# Patient Record
Sex: Female | Born: 1945 | Race: White | Hispanic: No | Marital: Married | Smoking: Never smoker
Health system: Southern US, Community
[De-identification: ages and names within clinical notes are randomized; demographics above are authoritative.]

## PROBLEM LIST (undated history)

## (undated) DIAGNOSIS — M1990 Unspecified osteoarthritis, unspecified site: Secondary | ICD-10-CM

## (undated) DIAGNOSIS — Z8619 Personal history of other infectious and parasitic diseases: Secondary | ICD-10-CM

## (undated) DIAGNOSIS — K5792 Diverticulitis of intestine, part unspecified, without perforation or abscess without bleeding: Secondary | ICD-10-CM

## (undated) DIAGNOSIS — G43909 Migraine, unspecified, not intractable, without status migrainosus: Secondary | ICD-10-CM

## (undated) DIAGNOSIS — H269 Unspecified cataract: Secondary | ICD-10-CM

## (undated) DIAGNOSIS — T7840XA Allergy, unspecified, initial encounter: Secondary | ICD-10-CM

## (undated) DIAGNOSIS — N200 Calculus of kidney: Secondary | ICD-10-CM

## (undated) DIAGNOSIS — D649 Anemia, unspecified: Secondary | ICD-10-CM

## (undated) DIAGNOSIS — E785 Hyperlipidemia, unspecified: Secondary | ICD-10-CM

## (undated) DIAGNOSIS — N3941 Urge incontinence: Secondary | ICD-10-CM

## (undated) DIAGNOSIS — E739 Lactose intolerance, unspecified: Secondary | ICD-10-CM

## (undated) DIAGNOSIS — I639 Cerebral infarction, unspecified: Secondary | ICD-10-CM

## (undated) DIAGNOSIS — E119 Type 2 diabetes mellitus without complications: Secondary | ICD-10-CM

## (undated) HISTORY — DX: Anemia, unspecified: D64.9

## (undated) HISTORY — DX: Migraine, unspecified, not intractable, without status migrainosus: G43.909

## (undated) HISTORY — PX: TUBAL LIGATION: SHX77

## (undated) HISTORY — DX: Allergy, unspecified, initial encounter: T78.40XA

## (undated) HISTORY — DX: Urge incontinence: N39.41

## (undated) HISTORY — DX: Hyperlipidemia, unspecified: E78.5

## (undated) HISTORY — DX: Unspecified osteoarthritis, unspecified site: M19.90

## (undated) HISTORY — DX: Lactose intolerance, unspecified: E73.9

## (undated) HISTORY — DX: Personal history of other infectious and parasitic diseases: Z86.19

## (undated) HISTORY — PX: LITHOTRIPSY: SUR834

## (undated) HISTORY — DX: Unspecified cataract: H26.9

## (undated) HISTORY — DX: Diverticulitis of intestine, part unspecified, without perforation or abscess without bleeding: K57.92

## (undated) HISTORY — DX: Cerebral infarction, unspecified: I63.9

---

## 1968-05-09 HISTORY — PX: APPENDECTOMY: SHX54

## 1969-05-09 HISTORY — PX: CHOLECYSTECTOMY: SHX55

## 1993-05-09 HISTORY — PX: BREAST SURGERY: SHX581

## 1997-09-30 ENCOUNTER — Ambulatory Visit (HOSPITAL_COMMUNITY): Admission: RE | Admit: 1997-09-30 | Discharge: 1997-09-30 | Payer: Self-pay | Admitting: Neurosurgery

## 2004-05-09 HISTORY — PX: ABDOMINAL HYSTERECTOMY: SHX81

## 2005-11-06 HISTORY — PX: COLONOSCOPY: SHX174

## 2012-11-07 ENCOUNTER — Ambulatory Visit (INDEPENDENT_AMBULATORY_CARE_PROVIDER_SITE_OTHER): Payer: Medicare Other | Admitting: Family Medicine

## 2012-11-07 ENCOUNTER — Encounter: Payer: Self-pay | Admitting: Family Medicine

## 2012-11-07 ENCOUNTER — Telehealth: Payer: Self-pay | Admitting: Family Medicine

## 2012-11-07 DIAGNOSIS — B078 Other viral warts: Secondary | ICD-10-CM

## 2012-11-07 DIAGNOSIS — B372 Candidiasis of skin and nail: Secondary | ICD-10-CM

## 2012-11-07 DIAGNOSIS — K1370 Unspecified lesions of oral mucosa: Secondary | ICD-10-CM | POA: Insufficient documentation

## 2012-11-07 DIAGNOSIS — G609 Hereditary and idiopathic neuropathy, unspecified: Secondary | ICD-10-CM

## 2012-11-07 DIAGNOSIS — B079 Viral wart, unspecified: Secondary | ICD-10-CM

## 2012-11-07 DIAGNOSIS — Z8719 Personal history of other diseases of the digestive system: Secondary | ICD-10-CM

## 2012-11-07 DIAGNOSIS — J309 Allergic rhinitis, unspecified: Secondary | ICD-10-CM

## 2012-11-07 DIAGNOSIS — E785 Hyperlipidemia, unspecified: Secondary | ICD-10-CM | POA: Insufficient documentation

## 2012-11-07 DIAGNOSIS — R87629 Unspecified abnormal cytological findings in specimens from vagina: Secondary | ICD-10-CM

## 2012-11-07 DIAGNOSIS — E1169 Type 2 diabetes mellitus with other specified complication: Secondary | ICD-10-CM | POA: Insufficient documentation

## 2012-11-07 DIAGNOSIS — G43109 Migraine with aura, not intractable, without status migrainosus: Secondary | ICD-10-CM | POA: Insufficient documentation

## 2012-11-07 DIAGNOSIS — Z1231 Encounter for screening mammogram for malignant neoplasm of breast: Secondary | ICD-10-CM

## 2012-11-07 DIAGNOSIS — R87622 Low grade squamous intraepithelial lesion on cytologic smear of vagina (LGSIL): Secondary | ICD-10-CM | POA: Insufficient documentation

## 2012-11-07 DIAGNOSIS — K219 Gastro-esophageal reflux disease without esophagitis: Secondary | ICD-10-CM

## 2012-11-07 DIAGNOSIS — E7800 Pure hypercholesterolemia, unspecified: Secondary | ICD-10-CM

## 2012-11-07 DIAGNOSIS — N952 Postmenopausal atrophic vaginitis: Secondary | ICD-10-CM | POA: Insufficient documentation

## 2012-11-07 DIAGNOSIS — G43909 Migraine, unspecified, not intractable, without status migrainosus: Secondary | ICD-10-CM

## 2012-11-07 DIAGNOSIS — Z8249 Family history of ischemic heart disease and other diseases of the circulatory system: Secondary | ICD-10-CM

## 2012-11-07 DIAGNOSIS — L304 Erythema intertrigo: Secondary | ICD-10-CM | POA: Insufficient documentation

## 2012-11-07 DIAGNOSIS — M19049 Primary osteoarthritis, unspecified hand: Secondary | ICD-10-CM | POA: Insufficient documentation

## 2012-11-07 MED ORDER — NYSTATIN 100000 UNIT/GM EX CREA: TOPICAL_CREAM | Freq: Two times a day (BID) | CUTANEOUS | Status: DC

## 2012-11-07 NOTE — Assessment & Plan Note (Signed)
We will obtain records. We will need to refer to local GYN>

## 2012-11-07 NOTE — Assessment & Plan Note (Addendum)
Had atypical migraine in 08/2012  Had associated speech issue (aphasia, trouble word finding), headache. NO other neuro changes  Work up was negative... Nml MRI.  Recommended referral to neurologist but she has not set this up yet.  She has had no migraine since. On baby aspirin.  Will move forward with referral to neurologist.

## 2012-11-07 NOTE — Assessment & Plan Note (Signed)
Treat with topical nystatin cream. 

## 2012-11-07 NOTE — Telephone Encounter (Signed)
Message copied by Kerby Nora E on Wed Nov 07, 2012  5:25 PM ------      Message from: Ricki Miller H      Created: Wed Nov 07, 2012  5:01 PM      Regarding: orders needed        While meeting with Dezzie, she expressed a desire to get a screening Mammogram at the [facility].  If you will place the order, we'll get it scheduled!  Thank you.            Ricki Miller ------

## 2012-11-07 NOTE — Patient Instructions (Addendum)
Stop at front desk for referral to neurologist. Follow oral lesions.. If not resolved in 4 month.. Call for further evaluation. Let me know if wart not improving with topical treatment.  Make appointment for right arm pain if not improving in next month.

## 2012-11-07 NOTE — Assessment & Plan Note (Signed)
OTC treatment

## 2012-11-07 NOTE — Assessment & Plan Note (Signed)
If not resolving in next 4 month ( 6 months total)... Refer to ENT for further eval. Pt is low risk.

## 2012-11-07 NOTE — Assessment & Plan Note (Signed)
Stable on amitriptyline

## 2012-11-07 NOTE — Progress Notes (Signed)
  Subjective:    Patient ID: Dana [name], female    DOB: 25-Apr-1946, 67 y.o.   MRN: [id number]  HPI  67 year old female presents to establish.  Moved from [place] three years ago.. She cannot drive that far any longer.  Last CPX in [place] in 04/2012  Elevated Cholesterol:  Last check in 04/2013, well controlled on lovastatin 40 mg daily Using medications without problems:None Muscle aches: None Diet compliance:Moderate Exercise: [employer] and [employer] 3 times a week. Other complaints:    Review of Systems  Constitutional: Negative for fever, fatigue and unexpected weight change.  HENT: Negative for ear pain, congestion, sore throat, sneezing, trouble swallowing and sinus pressure.        Oral lesion x 2 months, bites lip  alot  Eyes: Negative for pain and itching.  Respiratory: Negative for cough, shortness of breath and wheezing.   Cardiovascular: Negative for chest pain, palpitations and leg swelling.  Gastrointestinal: Negative for nausea, abdominal pain, diarrhea, constipation and blood in stool.  Genitourinary: Negative for dysuria, hematuria, vaginal discharge, difficulty urinating and menstrual problem.  Skin: Positive for rash.       Red itchy rash under breast for a least a year.  Neurological: Negative for syncope, weakness, light-headedness, numbness and headaches.  Psychiatric/Behavioral: Negative for confusion and dysphoric mood. The patient is not nervous/anxious.        Objective:   Physical Exam  Constitutional: Vital signs are normal. She appears well-developed and well-nourished. She is cooperative.  Non-toxic appearance. She does not appear ill. No distress.  HENT:  Head: Normocephalic.  Right Ear: Hearing, tympanic membrane, external ear and ear canal normal.  Left Ear: Hearing, tympanic membrane, external ear and ear canal normal.  Nose: Nose normal.  Eyes: Conjunctivae, EOM and lids are normal. Pupils are equal, round, and reactive to light. No foreign  bodies found.  Neck: Trachea normal and normal range of motion. Neck supple. Carotid bruit is not present. No mass and no thyromegaly present.  Cardiovascular: Normal rate, regular rhythm, S1 normal, S2 normal, normal heart sounds and intact distal pulses.  Exam reveals no gallop.   No murmur heard. Pulmonary/Chest: Effort normal and breath sounds normal. No respiratory distress. She has no wheezes. She has no rhonchi. She has no rales.  Abdominal: Soft. Normal appearance and bowel sounds are normal. She exhibits no distension, no fluid wave, no abdominal bruit and no mass. There is no hepatosplenomegaly. There is no tenderness. There is no rebound, no guarding and no CVA tenderness. No hernia.  Lymphadenopathy:    She has no cervical adenopathy.    She has no axillary adenopathy.  Neurological: She is alert. She has normal strength. No cranial nerve deficit or sensory deficit.  Skin: Skin is warm, dry and intact. No rash noted.  Erythema, mild under breasts, sweaty  lesion covered with topical treatment on right 2nd digit PIP joint  Hyperpigmented lesion on right lip, well circumscribed , uniform   prominent vein on inner lip on right    Psychiatric: Her speech is normal and behavior is normal. Judgment normal. Her mood appears not anxious. Cognition and memory are normal. She does not exhibit a depressed mood.          Assessment & Plan:

## 2012-11-08 ENCOUNTER — Telehealth: Payer: Self-pay

## 2012-11-08 NOTE — Telephone Encounter (Signed)
Pt wanted to know what pharmacy nystatin cream was sent to; advised pt [facility].pt voiced understanding.

## 2012-11-22 ENCOUNTER — Telehealth: Payer: Self-pay | Admitting: Family Medicine

## 2012-11-22 ENCOUNTER — Encounter: Payer: Self-pay | Admitting: Family Medicine

## 2012-11-22 DIAGNOSIS — K1370 Unspecified lesions of oral mucosa: Secondary | ICD-10-CM

## 2012-11-22 NOTE — Telephone Encounter (Signed)
Notify pt that previous records reviewed. It appears that she does not have a cervix... 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 Last one completely nml in 12/.23.  I agree with recommendations to repeat pap in yearly.Marland Kitchen Next in 04/2013... We can do here or refer her her to gyn per her request. If abn here we would refer any way.

## 2012-11-23 NOTE — Telephone Encounter (Signed)
Referral sent 

## 2012-11-23 NOTE — Addendum Note (Signed)
Addended byKerby Nora E on: 11/23/2012 10:02 AM   Modules accepted: Orders

## 2012-11-23 NOTE — Telephone Encounter (Signed)
Patient advised and she will do her pap her  Patient says she will want the referral to ENT now because she will be out of town sept, oct, nov

## 2012-11-26 ENCOUNTER — Other Ambulatory Visit: Payer: Self-pay | Admitting: Family Medicine

## 2012-11-26 NOTE — Telephone Encounter (Signed)
Pt seen 7/2, ok to refill?

## 2012-11-27 MED ORDER — ESTRADIOL 10 MCG VA TABS: 1.0000 | ORAL_TABLET | VAGINAL | Status: DC

## 2012-11-28 ENCOUNTER — Ambulatory Visit
Admission: RE | Admit: 2012-11-28 | Discharge: 2012-11-28 | Disposition: A | Discharge status: Home / Self Care | Payer: Medicare Other | Source: Ambulatory Visit | Attending: Family Medicine | Admitting: Family Medicine

## 2012-11-28 DIAGNOSIS — Z1231 Encounter for screening mammogram for malignant neoplasm of breast: Secondary | ICD-10-CM

## 2012-12-05 ENCOUNTER — Ambulatory Visit (INDEPENDENT_AMBULATORY_CARE_PROVIDER_SITE_OTHER): Payer: Medicare Other | Admitting: Neurology

## 2012-12-05 ENCOUNTER — Encounter: Payer: Self-pay | Admitting: Neurology

## 2012-12-05 VITALS — BP 141/82 | HR 76 | Ht 64.0 in | Wt 167.0 lb

## 2012-12-05 DIAGNOSIS — G43909 Migraine, unspecified, not intractable, without status migrainosus: Secondary | ICD-10-CM

## 2012-12-05 NOTE — Progress Notes (Signed)
[facility]  PATIENT: Dana [name] DOB: [birth year]  HISTORICAL  Mrs. Eddinger is a 67 yo Caucasian Female, came in for migraine, referred by her primary care physician, Dr. Ermalene Searing.  She had a history of migraine since young, in her 46s, her typical migraine started with visual aura, shadow coming down to one side of her visual field, lasting 5-10 minutes, then followed by a severe pounding headaches, piercing pain, bending over would exacerbate her headaches, her headache usually lasts for one day.  Bethann Berkshire has headaches occasionally, this year she had couple typical migraine headaches, one in May 2014, headache happened after she took a shower, she has difficulty understanding the questions, was taken to urgent care, and ambulance was called, she was sent to emergency room, CAT scan, laboratory evaluation was normal,  The most recent one was July 27th, again proceeding by visual aura, shadowing comes down from her eyes, she has difficulty talking for about 24 hours, severe pounding headaches,  In between episode, she denies visual change, lateralized motor or sensory deficit.  She is taking Relafen as needed for headaches, which seems to work for her, she only has few headaches each year, she does not want preventive medications, She does not want other abortive treatment either.   REVIEW OF SYSTEMS: Full 14 system review of systems performed and notable only for weight gain, swelling in legs, itching, joint pain, aching muscles, skin sensitivity, numbness, insomnia, restless legs, too much sleep, not enough sleep, decreased energy  ALLERGIES: Allergies  Allergen Reactions  . Niacin And Related Itching  . Septra (Sulfamethoxazole W-Trimethoprim) Itching    HOME MEDICATIONS: Outpatient Prescriptions Prior to Visit  Medication Sig Dispense Refill  . amitriptyline (ELAVIL) 25 MG tablet Take 25 mg by mouth at bedtime.  for right leg pain    . aspirin 81 MG tablet  Take 81 mg by mouth daily.      . B Complex Vitamins (B-COMPLEX/B-12) TABS Take by mouth. Take 1000mg  daily      . cholecalciferol (VITAMIN D) 1000 UNITS tablet Take 1,000 Units by mouth daily.      . Coenzyme Q10 60 MG CAPS Take 1 capsule by mouth daily.      . Estradiol (VAGIFEM) 10 MCG TABS vaginal tablet Place 1 tablet (10 mcg total) vaginally 2 (two) times a week.  8 tablet  2  . Krill Oil 300 MG CAPS Take 1 capsule by mouth daily.      Marland Kitchen lactase (LACTAID) 3000 UNITS tablet Take 1 tablet by mouth 3 (three) times daily with meals.      . lovastatin (MEVACOR) 40 MG tablet Take 80 mg by mouth at bedtime.      . Multiple Vitamin (MULTIVITAMIN) tablet Take 1 tablet by mouth daily.      . nabumetone (RELAFEN) 750 MG tablet Take 750 mg by mouth 2 (two) times daily.      Marland Kitchen nystatin cream (MYCOSTATIN) Apply topically 2 (two) times daily. Continue for 48 hours after rash resolved.  30 g  0  . omeprazole (PRILOSEC) 20 MG capsule Take 20 mg by mouth daily.      . Turmeric Curcumin 500 MG CAPS Take 1 tablet by mouth daily.      Marland Kitchen estradiol (VIVELLE-DOT) 0.0375 MG/24HR Place 1 patch onto the skin every 30 (thirty) days.         PAST MEDICAL HISTORY: Past Medical History  Diagnosis Date  . Anemia   . Arthritis   .  Allergy   . Hyperlipidemia   . Migraine   . History of chicken pox   . Diverticulitis     PAST SURGICAL HISTORY: Past Surgical History  Procedure Laterality Date  . Appendectomy  1970  . Breast surgery  1995  . Cholecystectomy  1971  . Tubal ligation    . Abdominal hysterectomy  2006    FAMILY HISTORY: Family History  Problem Relation Age of Onset  . Heart disease Mother 38  . Hyperlipidemia Mother   . Hyperlipidemia Father   . Stroke Father   . Heart disease Father   . Fibromyalgia Sister   . Hyperlipidemia Sister   . Heart disease Brother   . Diabetes Brother     SOCIAL HISTORY:  History   Social History  . Marital Status: Married    Spouse Name: [name]     Number of Children: 1  . Years of Education: 12   Occupational History    Retired as Designer, jewellery   Social History Main Topics  . Smoking status: Never Smoker   . Smokeless tobacco: Never Used  . Alcohol Use: No  . Drug Use: No  . Sexually Active: Not on file   Other Topics Concern  . Not on file   Social History Narrative    E xercise 3 times a week.   Patient is married and lives at home with her husband [name]). Patient is retired. Patient has high  school education.   Right handed.     PHYSICAL EXAM    Filed Vitals:   12/05/12 0927  BP: 141/82  Pulse: 76  Height: 5\' 4"  (1.626 m)  Weight: 167 lb (75.751 kg)     Body mass index is 28.65 kg/(m^2).   Generalized: In no acute distress  Neck: Supple, no carotid bruits   Cardiac: Regular rate rhythm  Pulmonary: Clear to auscultation bilaterally  Musculoskeletal: No deformity  Neurological examination  Mentation: Alert oriented to time, place, history taking, and causual conversation  Cranial nerve II-XII: Pupils were equal round reactive to light extraocular movements were full, visual field were full on confrontational test. facial sensation and strength were normal. hearing was intact to finger rubbing bilaterally. Uvula tongue midline.  head turning and shoulder shrug and were normal and symmetric.Tongue protrusion into cheek strength was normal.  Motor: normal tone, bulk and strength.  Sensory: Intact to fine touch, pinprick, preserved vibratory sensation, and proprioception at toes.  Coordination: Normal finger to nose, heel-to-shin bilaterally there was no truncal ataxia  Gait: Rising up from seated position without assistance, normal stance, without trunk ataxia, moderate stride, good arm swing, smooth turning, able to perform tiptoe, and heel walking without difficulty.   Romberg signs: Negative  Deep tendon reflexes: Brachioradialis 2/2, biceps 2/2, triceps 2/2, patellar 2/2, Achilles 2/2,  plantar responses were flexor bilaterally.   DIAGNOSTIC DATA (LABS, IMAGING, TESTING) - I reviewed patient records, labs, notes, testing and imaging myself where available.  ASSESSMENT AND PLAN   67 years old Caucasian female, with migraine headaches, proceeding by aura, normal neurological examination.  1. Relafen as needed for migraine treatment 2. return to clinic with Eber Jones in one year.     Levert Feinstein, M.D. Ph.D.  [facility] [address] [place], [place] [address] [phone]

## 2012-12-06 ENCOUNTER — Encounter: Payer: Self-pay | Admitting: Family Medicine

## 2012-12-12 ENCOUNTER — Other Ambulatory Visit: Payer: Self-pay

## 2013-01-04 ENCOUNTER — Other Ambulatory Visit: Payer: Self-pay | Admitting: *Deleted

## 2013-01-04 MED ORDER — OMEPRAZOLE 20 MG PO CPDR: 20.0000 mg | DELAYED_RELEASE_CAPSULE | Freq: Every day | ORAL | Status: DC

## 2013-01-04 MED ORDER — LOVASTATIN 40 MG PO TABS: 80.0000 mg | ORAL_TABLET | Freq: Every day | ORAL | Status: DC

## 2013-01-04 NOTE — Telephone Encounter (Signed)
OK to refill

## 2013-01-08 MED ORDER — AMITRIPTYLINE HCL 25 MG PO TABS: 25.0000 mg | ORAL_TABLET | Freq: Every day | ORAL | Status: DC

## 2013-01-08 MED ORDER — NABUMETONE 750 MG PO TABS: 750.0000 mg | ORAL_TABLET | Freq: Two times a day (BID) | ORAL | Status: DC

## 2013-01-09 ENCOUNTER — Ambulatory Visit (INDEPENDENT_AMBULATORY_CARE_PROVIDER_SITE_OTHER): Payer: Medicare Other

## 2013-01-09 DIAGNOSIS — Z23 Encounter for immunization: Secondary | ICD-10-CM

## 2013-01-14 ENCOUNTER — Other Ambulatory Visit: Payer: Self-pay | Admitting: *Deleted

## 2013-01-14 NOTE — Telephone Encounter (Signed)
Last OV 11/07/2012.  Ok to refill?

## 2013-01-15 ENCOUNTER — Other Ambulatory Visit: Payer: Self-pay

## 2013-01-15 MED ORDER — LOVASTATIN 40 MG PO TABS: 80.0000 mg | ORAL_TABLET | Freq: Every day | ORAL | Status: DC

## 2013-01-15 MED ORDER — AMITRIPTYLINE HCL 25 MG PO TABS: 25.0000 mg | ORAL_TABLET | Freq: Every day | ORAL | Status: DC

## 2013-01-15 MED ORDER — OMEPRAZOLE 20 MG PO CPDR: 20.0000 mg | DELAYED_RELEASE_CAPSULE | Freq: Every day | ORAL | Status: DC

## 2013-01-15 MED ORDER — NYSTATIN 100000 UNIT/GM EX CREA: TOPICAL_CREAM | Freq: Two times a day (BID) | CUTANEOUS | Status: DC

## 2013-01-15 MED ORDER — NABUMETONE 750 MG PO TABS: 750.0000 mg | ORAL_TABLET | Freq: Two times a day (BID) | ORAL | Status: DC

## 2013-01-15 NOTE — Telephone Encounter (Signed)
Pt left note requesting 3 month supply to [facility] for Elavil, Relafen. Omeprazole and lovastatin already done.Please advise.

## 2013-02-11 ENCOUNTER — Telehealth: Payer: Self-pay

## 2013-02-11 NOTE — Telephone Encounter (Signed)
Pt received missed call; spoke with Dr Daphine Deutscher CMA and she is not trying to reach pt; could have been wrong #; pt will wait for cb if needed. Pt said she does not need anything at this time.

## 2013-03-13 ENCOUNTER — Other Ambulatory Visit: Payer: Self-pay | Admitting: *Deleted

## 2013-03-13 NOTE — Telephone Encounter (Signed)
Last office visit 11/07/2012.  Not on medication list.  Ok to fill?

## 2013-03-14 MED ORDER — FLUTICASONE PROPIONATE 50 MCG/ACT NA SUSP: 2.0000 | Freq: Every day | NASAL | Status: DC

## 2013-04-19 ENCOUNTER — Other Ambulatory Visit: Payer: Self-pay | Admitting: Family Medicine

## 2013-04-26 ENCOUNTER — Ambulatory Visit (INDEPENDENT_AMBULATORY_CARE_PROVIDER_SITE_OTHER): Payer: Medicare Other | Admitting: Family Medicine

## 2013-04-26 ENCOUNTER — Encounter: Payer: Self-pay | Admitting: Family Medicine

## 2013-04-26 VITALS — BP 120/84 | HR 91 | Temp 98.1°F | Ht 63.0 in | Wt 172.5 lb

## 2013-04-26 DIAGNOSIS — R87622 Low grade squamous intraepithelial lesion on cytologic smear of vagina (LGSIL): Secondary | ICD-10-CM

## 2013-04-26 DIAGNOSIS — E7800 Pure hypercholesterolemia, unspecified: Secondary | ICD-10-CM

## 2013-04-26 DIAGNOSIS — Z0000 Encounter for general adult medical examination without abnormal findings: Secondary | ICD-10-CM

## 2013-04-26 MED ORDER — FLUTICASONE PROPIONATE 50 MCG/ACT NA SUSP: 2.0000 | Freq: Every day | NASAL | Status: DC

## 2013-04-26 MED ORDER — NABUMETONE 750 MG PO TABS: 750.0000 mg | ORAL_TABLET | Freq: Two times a day (BID) | ORAL | Status: DC

## 2013-04-26 MED ORDER — AMITRIPTYLINE HCL 25 MG PO TABS: 25.0000 mg | ORAL_TABLET | Freq: Every day | ORAL | Status: DC

## 2013-04-26 MED ORDER — LOVASTATIN 40 MG PO TABS: 80.0000 mg | ORAL_TABLET | Freq: Every day | ORAL | Status: DC

## 2013-04-26 MED ORDER — ESTRADIOL 0.0375 MG/24HR TD PTTW: 1.0000 | MEDICATED_PATCH | TRANSDERMAL | Status: DC

## 2013-04-26 MED ORDER — OMEPRAZOLE 20 MG PO CPDR: 20.0000 mg | DELAYED_RELEASE_CAPSULE | Freq: Every day | ORAL | Status: DC

## 2013-04-26 NOTE — Assessment & Plan Note (Signed)
Due for re-eval. 

## 2013-04-26 NOTE — Addendum Note (Signed)
Addended by: Damita Lack on: 04/26/2013 05:22 PM   Modules accepted: Orders

## 2013-04-26 NOTE — Patient Instructions (Addendum)
Return for fasting labs in next few weeks.  Stop at front for GYN referral. When you are scheduling mammogram in the summer 11/2013.. Call us to get an bone density order sent. [facility].

## 2013-04-26 NOTE — Progress Notes (Signed)
Subjective:    Patient ID: Dana [name], female    DOB: May 03, 1946, 67 y.o.   MRN: [id number]  HPI  I have personally reviewed the Medicare Annual Wellness questionnaire and have noted 1. The patient's medical and social history 2. Their use of alcohol, tobacco or illicit drugs 3. Their current medications and supplements 4. The patient's functional ability including ADL's, fall risks, home safety risks and hearing or visual             impairment. 5. Diet and physical activities 6. Evidence for depression or mood disorders The patients weight, height, BMI and visual acuity have been recorded in the chart I have made referrals, counseling and provided education to the patient based review of the above and I have provided the pt with a written personalized care plan for preventive services.    Elevated Cholesterol: Due for re-eval on mevacor Using medications without problems: Muscle aches:  Diet compliance: Exercise: Other complaints:  Migraine followed by Dr. Terrace Arabia neurology.    Review of Systems  Constitutional: Negative for fever and fatigue.  HENT: Negative for ear pain.   Eyes: Negative for pain.  Respiratory: Negative for chest tightness and shortness of breath.   Cardiovascular: Positive for leg swelling. Negative for chest pain and palpitations.       Off and on ankle edema  Gastrointestinal: Negative for abdominal pain.  Genitourinary: Negative for dysuria and vaginal bleeding.  Neurological: Negative for syncope.  Psychiatric/Behavioral: Negative for behavioral problems.       Objective:   Physical Exam  Constitutional: Vital signs are normal. She appears well-developed and well-nourished. She is cooperative.  Non-toxic appearance. She does not appear ill. No distress.  HENT:  Head: Normocephalic.  Right Ear: Hearing, tympanic membrane, external ear and ear canal normal.  Left Ear: Hearing, tympanic membrane, external ear and ear canal normal.  Nose: Nose  normal.  Eyes: Conjunctivae, EOM and lids are normal. Pupils are equal, round, and reactive to light. Lids are everted and swept, no foreign bodies found.  Neck: Trachea normal and normal range of motion. Neck supple. Carotid bruit is not present. No mass and no thyromegaly present.  Cardiovascular: Normal rate, regular rhythm, S1 normal, S2 normal, normal heart sounds and intact distal pulses.  Exam reveals no gallop.   No murmur heard. Pulmonary/Chest: Effort normal and breath sounds normal. No respiratory distress. She has no wheezes. She has no rhonchi. She has no rales.  Abdominal: Soft. Normal appearance and bowel sounds are normal. She exhibits no distension, no fluid wave, no abdominal bruit and no mass. There is no hepatosplenomegaly. There is no tenderness. There is no rebound, no guarding and no CVA tenderness. No hernia.  Genitourinary: No breast swelling, tenderness, discharge or bleeding. Pelvic exam was performed with patient prone.  Lymphadenopathy:    She has no cervical adenopathy.    She has no axillary adenopathy.  Neurological: She is alert. She has normal strength. No cranial nerve deficit or sensory deficit.  Skin: Skin is warm, dry and intact. No rash noted.  Psychiatric: Her speech is normal and behavior is normal. Judgment normal. Her mood appears not anxious. Cognition and memory are normal. She does not exhibit a depressed mood.          Assessment & Plan:  The patient's preventative maintenance and recommended screening tests for an annual wellness exam were reviewed in full today. Brought up to date unless services declined.  Counselled on the importance of  diet, exercise, and its role in overall health and mortality. The patient's FH and SH was reviewed, including their home life, tobacco status, and drug and alcohol status.    Vaccines:Tdap, shingles , PNA, and flu uptodate Colon: 2008 nml, repeat in 10 years. Mammo:11/2012 DVE/PAP: Hx of abnormal Pap  LGSIL 2011, then abnormal pap of vaginal cuff ASCUS 2012 s/p TAH ( TAH in 2006  performed for abnormal pap), but nml 2013 nml pap of vaginal cuff.. Referred to GYN. DEXA: overdue. Nonsmoker

## 2013-04-26 NOTE — Progress Notes (Signed)
Pre-visit discussion using our clinic review tool. No additional management support is needed unless otherwise documented below in the visit note.  

## 2013-04-29 ENCOUNTER — Other Ambulatory Visit (INDEPENDENT_AMBULATORY_CARE_PROVIDER_SITE_OTHER): Payer: Medicare Other

## 2013-04-29 DIAGNOSIS — E7800 Pure hypercholesterolemia, unspecified: Secondary | ICD-10-CM

## 2013-04-29 LAB — COMPREHENSIVE METABOLIC PANEL
ALT: 37 U/L — ABNORMAL HIGH (ref 0–35)
AST: 31 U/L (ref 0–37)
BUN: 13 mg/dL (ref 6–23)
CO2: 29 mEq/L (ref 19–32)
Creatinine, Ser: 0.7 mg/dL (ref 0.4–1.2)
GFR: 87.47 mL/min (ref >60.00)
Glucose, Bld: 108 mg/dL — ABNORMAL HIGH (ref 70–99)
Sodium: 141 mEq/L (ref 135–145)
Total Bilirubin: 0.8 mg/dL (ref 0.3–1.2)
Total Protein: 7.3 g/dL (ref 6.0–8.3)

## 2013-04-29 LAB — LIPID PANEL
Cholesterol: 188 mg/dL (ref 0–200)
HDL: 46.5 mg/dL (ref >39.00)
LDL Cholesterol: 118 mg/dL — ABNORMAL HIGH (ref 0–99)
Triglycerides: 120 mg/dL (ref 0.0–149.0)
VLDL: 24 mg/dL (ref 0.0–40.0)

## 2013-04-30 ENCOUNTER — Encounter: Payer: Self-pay | Admitting: Family Medicine

## 2013-05-07 ENCOUNTER — Encounter: Payer: Self-pay | Admitting: *Deleted

## 2013-05-09 HISTORY — PX: OTHER SURGICAL HISTORY: SHX169

## 2013-05-14 ENCOUNTER — Encounter: Payer: Self-pay | Admitting: Family Medicine

## 2013-09-23 ENCOUNTER — Other Ambulatory Visit: Payer: Self-pay | Admitting: *Deleted

## 2013-09-23 MED ORDER — LOVASTATIN 40 MG PO TABS: 80.0000 mg | ORAL_TABLET | Freq: Every day | ORAL | Status: DC

## 2013-11-22 ENCOUNTER — Encounter: Payer: Self-pay | Admitting: Family Medicine

## 2013-11-22 ENCOUNTER — Ambulatory Visit (INDEPENDENT_AMBULATORY_CARE_PROVIDER_SITE_OTHER): Payer: Medicare Other | Admitting: Family Medicine

## 2013-11-22 VITALS — BP 126/70 | HR 74 | Temp 97.9°F | Wt 173.5 lb

## 2013-11-22 DIAGNOSIS — M659 Synovitis and tenosynovitis, unspecified: Secondary | ICD-10-CM

## 2013-11-22 DIAGNOSIS — M7670 Peroneal tendinitis, unspecified leg: Secondary | ICD-10-CM

## 2013-11-22 NOTE — Patient Instructions (Signed)
This looks like a strain.  Ice your foot and use a lace up ankle brace.  Try to get off your feet and this should get better.  Take care.

## 2013-11-22 NOTE — Progress Notes (Signed)
Pre visit review using our clinic review tool, if applicable. No additional management support is needed unless otherwise documented below in the visit note.  R ankle pain.  Prev eval, was told she had edema due to HTN. Was advised to 'take a fluid pill'.  More pain recently.  Pain on the dorsum of the R foot, lateral R ankle and then up the lateral shin.  Pain with dorsiflexion.  More pain since yesterday.  Was having trouble wearing her tennis shoes, couldn't find anything comfortable.  Had been walking a lot recently, up hills in [place] on vacation.  No trauma.  No L foot pain.    Meds, vitals, and allergies reviewed.   ROS: See HPI.  Otherwise, noncontributory.  nad ncat R foot w/o edema Normal inspection, not puffy, no bruising Normal DP pulse. 5th MT not ttp Bony prominences not ttp ttp inf and posterior to lateral mal, but not on the mal itself.

## 2013-11-24 DIAGNOSIS — M7670 Peroneal tendinitis, unspecified leg: Secondary | ICD-10-CM | POA: Insufficient documentation

## 2013-11-24 NOTE — Assessment & Plan Note (Signed)
Likely dx, RICE, lace up ankle brace and f/u prn.  D/w pt.  Likely strain with recent walking.

## 2013-11-25 ENCOUNTER — Other Ambulatory Visit: Payer: Self-pay | Admitting: *Deleted

## 2013-11-25 MED ORDER — OMEPRAZOLE 20 MG PO CPDR: 20.0000 mg | DELAYED_RELEASE_CAPSULE | Freq: Every day | ORAL | Status: DC

## 2013-11-25 NOTE — Telephone Encounter (Signed)
Received faxed refill request from pharmacy. Refill sent to pharmacy electronically. 

## 2013-12-05 ENCOUNTER — Ambulatory Visit (INDEPENDENT_AMBULATORY_CARE_PROVIDER_SITE_OTHER): Payer: Medicare Other | Admitting: Nurse Practitioner

## 2013-12-05 ENCOUNTER — Encounter: Payer: Self-pay | Admitting: Nurse Practitioner

## 2013-12-05 VITALS — BP 140/80 | HR 73 | Ht 64.0 in | Wt 171.5 lb

## 2013-12-05 DIAGNOSIS — G43909 Migraine, unspecified, not intractable, without status migrainosus: Secondary | ICD-10-CM

## 2013-12-05 MED ORDER — NABUMETONE 750 MG PO TABS: 750.0000 mg | ORAL_TABLET | Freq: Two times a day (BID) | ORAL | Status: DC | PRN

## 2013-12-05 NOTE — Patient Instructions (Signed)
Continue Relafen for headache, will refill Given information on Migraine triggers F/U yearly and PRN

## 2013-12-05 NOTE — Progress Notes (Signed)
[facility]  PATIENT: Dana [name] DOB: 11-Feb-1946   REASON FOR VISIT: follow up for migraine    HISTORY OF PRESENT ILLNESS: Dana [name], 68 year old female returns for followup. She has a history of migraine since her 20s,her typical migraine started with visual aura, shadow coming down to one side of her visual field, lasting 5-10 minutes, then followed by a severe pounding headaches, piercing pain, bending over would exacerbate her headaches, her headache usually lasts for one day. She reports 2 migraines in the last year. The most recent was about a week ago. She is taking her Relafen as needed which continues to work for her. She does not want preventive medication.  She returns for reevaluation   HISTORY: Dana [name] is a 68 yo Caucasian Female, came in for migraine, referred by her primary care physician, Dr. Diona Browner.  She had a history of migraine since young, in her 1s, her typical migraine started with visual aura, shadow coming down to one side of her visual field, lasting 5-10 minutes, then followed by a severe pounding headaches, piercing pain, bending over would exacerbate her headaches, her headache usually lasts for one day.  Dana [name] has headaches occasionally, this year she had couple typical migraine headaches, one in May 2014, headache happened after she took a shower, she has difficulty understanding the questions, was taken to urgent care, and ambulance was called, she was sent to emergency room, CAT scan, laboratory evaluation was normal,  The most recent one was July 27th, again proceeding by visual aura, shadowing comes down from her eyes, she has difficulty talking for about 24 hours, severe pounding headaches,  In between episode, she denies visual change, lateralized motor or sensory deficit.  She is taking Relafen as needed for headaches, which seems to work for her, she only has few headaches each year, she does not want preventive medications, She  does not want other abortive treatment either.   REVIEW OF SYSTEMS: Full 14 system review of systems performed and notable only for those listed, all others are neg:  Constitutional: N/A  Cardiovascular: N/A  Ear/Nose/Throat: N/A  Skin: N/A  Eyes: N/A  Respiratory: N/A  Gastroitestinal: N/A  Hematology/Lymphatic: N/A  Endocrine: N/A Musculoskeletal:N/A  Allergy/Immunology: N/A  Neurological: N/A Psychiatric: N/A Sleep : NA   ALLERGIES: Allergies  Allergen Reactions  . Niacin And Related Itching  . Septra [Sulfamethoxazole-Trimethoprim] Itching    HOME MEDICATIONS: Outpatient Prescriptions Prior to Visit  Medication Sig Dispense Refill  . aspirin 81 MG tablet Take 81 mg by mouth daily.      . B Complex Vitamins (B-COMPLEX/B-12) TABS Take by mouth. Take 1000mg  daily      . cholecalciferol (VITAMIN D) 1000 UNITS tablet Take 1,000 Units by mouth daily.      . Coenzyme Q10 60 MG CAPS Take 1 capsule by mouth daily.      . fluticasone (FLONASE) 50 MCG/ACT nasal spray Place 2 sprays into both nostrils daily.  48 g  3  . Krill Oil 300 MG CAPS Take 1 capsule by mouth daily.      Marland Kitchen lactase (LACTAID) 3000 UNITS tablet Take 1 tablet by mouth 3 (three) times daily with meals.      . lovastatin (MEVACOR) 40 MG tablet Take 2 tablets (80 mg total) by mouth at bedtime.  180 tablet  1  . Multiple Vitamin (MULTIVITAMIN) tablet Take 1 tablet by mouth daily.      . nabumetone (RELAFEN) 750 MG tablet Take 1  tablet (750 mg total) by mouth 2 (two) times daily.  180 tablet  1  . nystatin cream (MYCOSTATIN) Apply topically 2 (two) times daily. Continue for 48 hours after rash resolved.  30 g  0  . omeprazole (PRILOSEC) 20 MG capsule Take 1 capsule (20 mg total) by mouth daily.  90 capsule  0  . Turmeric Curcumin 500 MG CAPS Take 1 tablet by mouth daily.      Marland Kitchen VAGIFEM 10 MCG TABS vaginal tablet USE 1 TAB VAGINALLY 2 TIMES A WEEK  8 tablet  5   No facility-administered medications prior to visit.     PAST MEDICAL HISTORY: Past Medical History  Diagnosis Date  . Anemia   . Arthritis   . Allergy   . Hyperlipidemia   . Migraine   . History of chicken pox   . Diverticulitis     PAST SURGICAL HISTORY: Past Surgical History  Procedure Laterality Date  . Appendectomy  1970  . Breast surgery  1995    biopsy  . Cholecystectomy  1971  . Tubal ligation    . Abdominal hysterectomy  2006    cervix remains, menorrhagia    FAMILY HISTORY: Family History  Problem Relation Age of Onset  . Heart disease Mother 53  . Hyperlipidemia Mother   . Hyperlipidemia Father   . Stroke Father   . Heart disease Father   . Fibromyalgia Sister   . Hyperlipidemia Sister   . Heart disease Brother   . Diabetes Brother     SOCIAL HISTORY: History   Social History  . Marital Status: Married    Spouse Name: [name]    Number of Children: 1  . Years of Education: 12   Occupational History  .      Retired   Social History Main Topics  . Smoking status: Never Smoker   . Smokeless tobacco: Never Used  . Alcohol Use: No  . Drug Use: No  . Sexual Activity: Not on file   Other Topics Concern  . Not on file   Social History Narrative       E xercise 3 times a week.      Patient is married and lives at home with her husband [name]). Patient is retired. Patient has high  school education.   Right handed.   Caffeine- None      HAs living will, HCPOA, full code ( reviewed 2014)     PHYSICAL EXAM  Filed Vitals:   12/05/13 0916  BP: 154/75  Pulse: 73  Height: 5\' 4"  (1.626 m)  Weight: 171 lb 8 oz (77.792 kg)   Body mass index is 29.42 kg/(m^2). Generalized: In no acute distress  Neck: Supple, no carotid bruits , bil cerumen Musculoskeletal: No deformity  Neurological examination  Mentation: Alert oriented to time, place, history taking, and causual conversation  Cranial nerve II-XII: Pupils were equal round reactive to light extraocular movements were full, visual field were  full on confrontational test. facial sensation and strength were normal. hearing was intact to finger rubbing bilaterally. Uvula tongue midline. head turning and shoulder shrug and were normal and symmetric.Tongue protrusion into cheek strength was normal.  Motor: normal tone, bulk and strength. No focal weakness Coordination: Normal finger to nose, heel-to-shin bilaterally there was no truncal ataxia  Gait: Rising up from seated position without assistance, normal stance,  moderate stride, good arm swing, smooth turning, able to perform tiptoe, and heel walking without difficulty.  Romberg signs: Negative  Deep tendon reflexes: Brachioradialis 2/2, biceps 2/2, triceps 2/2, patellar 2/2, Achilles 2/2, plantar responses were flexor bilaterally.    DIAGNOSTIC DATA (LABS, IMAGING, TESTING) - I reviewed patient records, labs, notes, testing and imaging myself where available.      Component Value Date/Time   NA 141 04/29/2013 0841   K 3.5 04/29/2013 0841   CL 105 04/29/2013 0841   CO2 29 04/29/2013 0841   GLUCOSE 108* 04/29/2013 0841   BUN 13 04/29/2013 0841   CREATININE 0.7 04/29/2013 0841   CALCIUM 9.0 04/29/2013 0841   PROT 7.3 04/29/2013 0841   ALBUMIN 3.9 04/29/2013 0841   AST 31 04/29/2013 0841   ALT 37* 04/29/2013 0841   ALKPHOS 78 04/29/2013 0841   BILITOT 0.8 04/29/2013 0841   Lab Results  Component Value Date   CHOL 188 04/29/2013   HDL 46.50 04/29/2013   LDLCALC 118* 04/29/2013   TRIG 120.0 04/29/2013   CHOLHDL 4 04/29/2013    ASSESSMENT AND PLAN  68 y.o. year old female  has a past medical history of migraine headaches preceded by aura with  normal neurologic exam  Continue Relafen for headache, will refill Given information on Migraine triggers F/U yearly and PRN Dennie Bible, [facility], [facility], APRN  [facility] [address] [place], [place] [address] [phone]

## 2013-12-10 ENCOUNTER — Encounter: Payer: Self-pay | Admitting: Family Medicine

## 2013-12-10 ENCOUNTER — Ambulatory Visit (INDEPENDENT_AMBULATORY_CARE_PROVIDER_SITE_OTHER): Payer: Medicare Other | Admitting: Family Medicine

## 2013-12-10 VITALS — BP 120/78 | HR 70 | Temp 98.4°F | Ht 63.0 in | Wt 171.5 lb

## 2013-12-10 DIAGNOSIS — M659 Synovitis and tenosynovitis, unspecified: Secondary | ICD-10-CM

## 2013-12-10 DIAGNOSIS — M67479 Ganglion, unspecified ankle and foot: Secondary | ICD-10-CM | POA: Insufficient documentation

## 2013-12-10 DIAGNOSIS — R030 Elevated blood-pressure reading, without diagnosis of hypertension: Secondary | ICD-10-CM | POA: Insufficient documentation

## 2013-12-10 DIAGNOSIS — M7671 Peroneal tendinitis, right leg: Secondary | ICD-10-CM

## 2013-12-10 DIAGNOSIS — M6740 Ganglion, unspecified site: Secondary | ICD-10-CM

## 2013-12-10 MED ORDER — FUROSEMIDE 20 MG PO TABS: 20.0000 mg | ORAL_TABLET | ORAL | Status: DC | PRN

## 2013-12-10 NOTE — Assessment & Plan Note (Signed)
Nl BP today. Verify measurements elevated > 3 times at home for diagnosis.  Encouraged exercise, weight loss, healthy eating habits.  Follow up in 2 weeks.

## 2013-12-10 NOTE — Assessment & Plan Note (Signed)
Improving. Pain in 5th digit likely due to gait change.  Recommended home rehab on ankle INFo given in detail for exercises.

## 2013-12-10 NOTE — Patient Instructions (Addendum)
Check BP on home cufff daily, at various times of day. Record measurements.  Follow BP in 2 weeks, bring in home record.  Work on regular exercise. Work on low salt heart healthy diet.  Apply unmedicated corn pad to right toe lesion.  Start home ankle rehab.

## 2013-12-10 NOTE — Progress Notes (Signed)
Subjective:    Patient ID: Dana [name], female    DOB: [birth year], 68 y.o.   MRN: [id number]  HPI  68 year old female presents for BP check as well as right foot/ankle pain.  Had BP elevation at Dr. Earlie Server office. 140-153/80. Was not in pain. No new meds, no decongestants. No previous Dx HTN,  Has family hx of early CAD, HTN. Uses lasix prn swelling BP Readings from Last 3 Encounters:  12/10/13 120/78  12/05/13 140/80  11/22/13 126/70  Using medication without problems or lightheadedness:  Chest pain with exertion: None Edema: once a month using lasix Short of breath:None Average home BPs: not checking. Other issues: Exercises 3 days a week.  Last CPX in 04/2013  Right ankle/ foot pain: On going since early 11/2013.  Noted pain after trip doing a lot of walking hills. She has been wearing lace up ankle brace. Pain has improved some but still soreness in right little toe, mild soreness in lateral ankle. Occ shooting pain. She has not been doing any rehab exercise. No redness and swelling.  She has also noted a skin lesion on right 4th toe, improved some  OTC compound W but then worsened again.      Review of Systems  Constitutional: Negative for fever and fatigue.  HENT: Negative for ear pain.   Eyes: Negative for pain.  Respiratory: Negative for chest tightness and shortness of breath.   Cardiovascular: Positive for leg swelling. Negative for chest pain and palpitations.  Gastrointestinal: Negative for abdominal pain.  Genitourinary: Negative for dysuria.       Objective:   Physical Exam  Constitutional: Vital signs are normal. She appears well-developed and well-nourished. She is cooperative.  Non-toxic appearance. She does not appear ill. No distress.  HENT:  Head: Normocephalic.  Right Ear: Hearing, tympanic membrane, external ear and ear canal normal. Tympanic membrane is not erythematous, not retracted and not bulging.  Left Ear: Hearing, tympanic  membrane, external ear and ear canal normal. Tympanic membrane is not erythematous, not retracted and not bulging.  Nose: No mucosal edema or rhinorrhea. Right sinus exhibits no maxillary sinus tenderness and no frontal sinus tenderness. Left sinus exhibits no maxillary sinus tenderness and no frontal sinus tenderness.  Mouth/Throat: Uvula is midline, oropharynx is clear and moist and mucous membranes are normal.  Eyes: Conjunctivae, EOM and lids are normal. Pupils are equal, round, and reactive to light. Lids are everted and swept, no foreign bodies found.  Neck: Trachea normal and normal range of motion. Neck supple. Carotid bruit is not present. No mass and no thyromegaly present.  Cardiovascular: Normal rate, regular rhythm, S1 normal, S2 normal, normal heart sounds, intact distal pulses and normal pulses.  Exam reveals no gallop and no friction rub.   No murmur heard. Pulmonary/Chest: Effort normal and breath sounds normal. Not tachypneic. No respiratory distress. She has no decreased breath sounds. She has no wheezes. She has no rhonchi. She has no rales.  Abdominal: Soft. Normal appearance and bowel sounds are normal. There is no tenderness.  Musculoskeletal:       Right ankle: No tenderness. No lateral malleolus and no medial malleolus tenderness found.       Right foot: She exhibits no tenderness.  Neurological: She is alert.  Skin: Skin is warm, dry and intact. No rash noted.  cear fluid filled cyst on 4th digit... ? Myxedema cyst?  Psychiatric: Her speech is normal and behavior is normal. Judgment and thought content normal.  Her mood appears not anxious. Cognition and memory are normal. She does not exhibit a depressed mood.          Assessment & Plan:

## 2013-12-10 NOTE — Assessment & Plan Note (Signed)
Stop wart treatment. No wart present. Apply corn pad unmedicated to stop rubbing. If bothering more... Can refer for removal.

## 2013-12-10 NOTE — Progress Notes (Signed)
Pre visit review using our clinic review tool, if applicable. No additional management support is needed unless otherwise documented below in the visit note. 

## 2013-12-12 ENCOUNTER — Ambulatory Visit: Payer: Medicare Other | Admitting: Nurse Practitioner

## 2013-12-23 ENCOUNTER — Other Ambulatory Visit: Payer: Self-pay

## 2013-12-23 ENCOUNTER — Ambulatory Visit: Payer: Medicare Other | Admitting: Nurse Practitioner

## 2013-12-23 DIAGNOSIS — Z1231 Encounter for screening mammogram for malignant neoplasm of breast: Secondary | ICD-10-CM

## 2013-12-24 ENCOUNTER — Ambulatory Visit (INDEPENDENT_AMBULATORY_CARE_PROVIDER_SITE_OTHER): Payer: Medicare Other | Admitting: Family Medicine

## 2013-12-24 ENCOUNTER — Encounter: Payer: Self-pay | Admitting: Family Medicine

## 2013-12-24 VITALS — BP 124/72 | HR 66 | Temp 97.8°F | Wt 172.2 lb

## 2013-12-24 DIAGNOSIS — E119 Type 2 diabetes mellitus without complications: Secondary | ICD-10-CM | POA: Insufficient documentation

## 2013-12-24 DIAGNOSIS — E1169 Type 2 diabetes mellitus with other specified complication: Secondary | ICD-10-CM | POA: Insufficient documentation

## 2013-12-24 DIAGNOSIS — M65979 Unspecified synovitis and tenosynovitis, unspecified ankle and foot: Secondary | ICD-10-CM

## 2013-12-24 DIAGNOSIS — M7670 Peroneal tendinitis, unspecified leg: Secondary | ICD-10-CM

## 2013-12-24 DIAGNOSIS — R7309 Other abnormal glucose: Secondary | ICD-10-CM

## 2013-12-24 DIAGNOSIS — E118 Type 2 diabetes mellitus with unspecified complications: Secondary | ICD-10-CM | POA: Insufficient documentation

## 2013-12-24 DIAGNOSIS — R7303 Prediabetes: Secondary | ICD-10-CM

## 2013-12-24 DIAGNOSIS — M659 Synovitis and tenosynovitis, unspecified: Secondary | ICD-10-CM

## 2013-12-24 DIAGNOSIS — R030 Elevated blood-pressure reading, without diagnosis of hypertension: Secondary | ICD-10-CM

## 2013-12-24 NOTE — Progress Notes (Signed)
Pre visit review using our clinic review tool, if applicable. No additional management support is needed unless otherwise documented below in the visit note. 

## 2013-12-24 NOTE — Assessment & Plan Note (Signed)
Resolved

## 2013-12-24 NOTE — Assessment & Plan Note (Signed)
Counseled on diet and lifestyle changes. Info given and reviewed with pt.

## 2013-12-24 NOTE — Progress Notes (Signed)
   Subjective:    Patient ID: Henrine Screws Theurer, female    DOB: 05-12-1945, 68 y.o.   MRN: [id number]  HPI  68 year old female presents for follow up BP. At home 2 weeks ago it had been elevated.  IN office 2 weeks ago and now it is in nml range.   BP Readings from Last 3 Encounters:  12/24/13 124/72  12/10/13 120/78  12/05/13 140/80   She reports at home 125-137/74-82.   She reports her ankle is better.  Review of Systems  Constitutional: Negative for fever and fatigue.  HENT: Negative for ear pain.   Eyes: Negative for pain.  Respiratory: Negative for chest tightness and shortness of breath.   Cardiovascular: Negative for chest pain, palpitations and leg swelling.  Gastrointestinal: Negative for abdominal pain.  Genitourinary: Negative for dysuria.       Objective:   Physical Exam  Constitutional: Vital signs are normal. She appears well-developed and well-nourished. She is cooperative.  Non-toxic appearance. She does not appear ill. No distress.  HENT:  Right Ear: Hearing, tympanic membrane and ear canal normal. Tympanic membrane is not erythematous, not retracted and not bulging.  Left Ear: Hearing, tympanic membrane and ear canal normal. Tympanic membrane is not erythematous, not retracted and not bulging.  Nose: No mucosal edema or rhinorrhea. Right sinus exhibits no maxillary sinus tenderness and no frontal sinus tenderness. Left sinus exhibits no maxillary sinus tenderness and no frontal sinus tenderness.  Mouth/Throat: Uvula is midline and mucous membranes are normal.  Eyes: Conjunctivae, EOM and lids are normal. Pupils are equal, round, and reactive to light. Lids are everted and swept, no foreign bodies found.  Neck: Trachea normal and normal range of motion. Neck supple. Carotid bruit is not present. No mass and no thyromegaly present.  Cardiovascular: Normal rate, regular rhythm, S1 normal, S2 normal, normal heart sounds, intact distal pulses and normal pulses.  Exam  reveals no gallop and no friction rub.   No murmur heard. Pulmonary/Chest: Effort normal and breath sounds normal. Not tachypneic. No respiratory distress. She has no decreased breath sounds. She has no wheezes. She has no rhonchi. She has no rales.  Abdominal: Normal appearance.  Neurological: She is alert.  Skin: Skin is warm, dry and intact. No rash noted.  Psychiatric: Her speech is normal and behavior is normal. Judgment and thought content normal. Her mood appears not anxious. Cognition and memory are normal. She does not exhibit a depressed mood.          Assessment & Plan:

## 2013-12-24 NOTE — Patient Instructions (Addendum)
Check blood pressure once a week or so, and if feeling poorly.  Otherwise, work on healthy eating and exercise.  Keep appt for CPX with labs prior.   Diabetes and Exercise Exercising regularly is important. It is not just about losing weight. It has many health benefits, such as:  Improving your overall fitness, flexibility, and endurance.  Increasing your bone density.  Helping with weight control.  Decreasing your body fat.  Increasing your muscle strength.  Reducing stress and tension.  Improving your overall health. People with diabetes who exercise gain additional benefits because exercise:  Reduces appetite.  Improves the body's use of blood sugar (glucose).  Helps lower or control blood glucose.  Decreases blood pressure.  Helps control blood lipids (such as cholesterol and triglycerides).  Improves the body's use of the hormone insulin by:  Increasing the body's insulin sensitivity.  Reducing the body's insulin needs.  Decreases the risk for heart disease because exercising:  Lowers cholesterol and triglycerides levels.  Increases the levels of good cholesterol (such as high-density lipoproteins [HDL]) in the body.  Lowers blood glucose levels. YOUR ACTIVITY PLAN  Choose an activity that you enjoy and set realistic goals. Your health care provider or diabetes educator can help you make an activity plan that works for you. Exercise regularly as directed by your health care provider. This includes:  Performing resistance training twice a week such as push-ups, sit-ups, lifting weights, or using resistance bands.  Performing 150 minutes of cardio exercises each week such as walking, running, or playing sports.  Staying active and spending no more than 90 minutes at one time being inactive. Even short bursts of exercise are good for you. Three 10-minute sessions spread throughout the day are just as beneficial as a single 30-minute session. Some exercise  ideas include:  Taking the dog for a walk.  Taking the stairs instead of the elevator.  Dancing to your favorite song.  Doing an exercise video.  Doing your favorite exercise with a friend. RECOMMENDATIONS FOR EXERCISING WITH TYPE 1 OR TYPE 2 DIABETES   Check your blood glucose before exercising. If blood glucose levels are greater than 240 mg/dL, check for urine ketones. Do not exercise if ketones are present.  Avoid injecting insulin into areas of the body that are going to be exercised. For example, avoid injecting insulin into:  The arms when playing tennis.  The legs when jogging.  Keep a record of:  Food intake before and after you exercise.  Expected peak times of insulin action.  Blood glucose levels before and after you exercise.  The type and amount of exercise you have done.  Review your records with your health care provider. Your health care provider will help you to develop guidelines for adjusting food intake and insulin amounts before and after exercising.  If you take insulin or oral hypoglycemic agents, watch for signs and symptoms of hypoglycemia. They include:  Dizziness.  Shaking.  Sweating.  Chills.  Confusion.  Drink plenty of water while you exercise to prevent dehydration or heat stroke. Body water is lost during exercise and must be replaced.  Talk to your health care provider before starting an exercise program to make sure it is safe for you. Remember, almost any type of activity is better than none. Document Released: 07/16/2003 Document Revised: 09/09/2013 Document Reviewed: 10/02/2012 [facility] Patient Information 2015 [place], [place]. This information is not intended to replace advice given to you by your health care provider. Make sure you discuss  any questions you have with your health care provider.  

## 2013-12-24 NOTE — Assessment & Plan Note (Addendum)
Well controlled on no med. Encouraged exercise, weight loss, healthy eating habits.   

## 2014-01-14 ENCOUNTER — Ambulatory Visit
Admission: RE | Admit: 2014-01-14 | Discharge: 2014-01-14 | Disposition: A | Discharge status: Home / Self Care | Payer: Medicare Other | Source: Ambulatory Visit

## 2014-01-14 DIAGNOSIS — Z1231 Encounter for screening mammogram for malignant neoplasm of breast: Secondary | ICD-10-CM

## 2014-01-17 ENCOUNTER — Other Ambulatory Visit: Payer: Self-pay | Admitting: Family Medicine

## 2014-01-17 DIAGNOSIS — R928 Other abnormal and inconclusive findings on diagnostic imaging of breast: Secondary | ICD-10-CM

## 2014-01-24 ENCOUNTER — Other Ambulatory Visit: Payer: Self-pay | Admitting: Family Medicine

## 2014-01-24 ENCOUNTER — Ambulatory Visit
Admission: RE | Admit: 2014-01-24 | Discharge: 2014-01-24 | Disposition: A | Discharge status: Home / Self Care | Payer: Medicare Other | Source: Ambulatory Visit | Attending: Family Medicine | Admitting: Family Medicine

## 2014-01-24 DIAGNOSIS — R921 Mammographic calcification found on diagnostic imaging of breast: Secondary | ICD-10-CM

## 2014-01-24 DIAGNOSIS — R928 Other abnormal and inconclusive findings on diagnostic imaging of breast: Secondary | ICD-10-CM

## 2014-01-27 ENCOUNTER — Other Ambulatory Visit: Payer: Self-pay | Admitting: *Deleted

## 2014-01-27 MED ORDER — OMEPRAZOLE 20 MG PO CPDR: 20.0000 mg | DELAYED_RELEASE_CAPSULE | Freq: Every day | ORAL | Status: DC

## 2014-01-31 ENCOUNTER — Ambulatory Visit
Admission: RE | Admit: 2014-01-31 | Discharge: 2014-01-31 | Disposition: A | Discharge status: Home / Self Care | Payer: Medicare Other | Source: Ambulatory Visit | Attending: Family Medicine | Admitting: Family Medicine

## 2014-01-31 DIAGNOSIS — R921 Mammographic calcification found on diagnostic imaging of breast: Secondary | ICD-10-CM

## 2014-04-02 ENCOUNTER — Encounter: Payer: Self-pay | Admitting: Neurology

## 2014-04-22 ENCOUNTER — Telehealth: Payer: Self-pay | Admitting: Family Medicine

## 2014-04-22 ENCOUNTER — Other Ambulatory Visit (INDEPENDENT_AMBULATORY_CARE_PROVIDER_SITE_OTHER): Payer: Medicare Other

## 2014-04-22 DIAGNOSIS — R7309 Other abnormal glucose: Secondary | ICD-10-CM

## 2014-04-22 DIAGNOSIS — R7303 Prediabetes: Secondary | ICD-10-CM

## 2014-04-22 DIAGNOSIS — E7800 Pure hypercholesterolemia, unspecified: Secondary | ICD-10-CM

## 2014-04-22 LAB — LIPID PANEL
CHOL/HDL RATIO: 4
Cholesterol: 198 mg/dL (ref 0–200)
HDL: 44.2 mg/dL (ref >39.00)
LDL Cholesterol: 126 mg/dL — ABNORMAL HIGH (ref 0–99)
NonHDL: 153.8
TRIGLYCERIDES: 140 mg/dL (ref 0.0–149.0)
VLDL: 28 mg/dL (ref 0.0–40.0)

## 2014-04-22 LAB — COMPREHENSIVE METABOLIC PANEL
ALT: 31 U/L (ref 0–35)
AST: 24 U/L (ref 0–37)
Albumin: 3.9 g/dL (ref 3.5–5.2)
Alkaline Phosphatase: 75 U/L (ref 39–117)
BILIRUBIN TOTAL: 0.7 mg/dL (ref 0.2–1.2)
BUN: 15 mg/dL (ref 6–23)
CALCIUM: 9.1 mg/dL (ref 8.4–10.5)
CHLORIDE: 107 meq/L (ref 96–112)
CO2: 29 mEq/L (ref 19–32)
CREATININE: 0.8 mg/dL (ref 0.4–1.2)
GFR: 81.86 mL/min (ref >60.00)
Glucose, Bld: 106 mg/dL — ABNORMAL HIGH (ref 70–99)
Potassium: 4.1 mEq/L (ref 3.5–5.1)
Sodium: 140 mEq/L (ref 135–145)
Total Protein: 7.1 g/dL (ref 6.0–8.3)

## 2014-04-22 LAB — HEMOGLOBIN A1C: Hgb A1c MFr Bld: 6.4 % (ref 4.6–6.5)

## 2014-04-22 NOTE — Telephone Encounter (Signed)
-----   Message from Ellamae Sia sent at 04/17/2014 10:40 AM EST ----- Regarding: Lab orders for Tuesday, 12.15.15 Patient is scheduled for CPX labs, please order future labs, Thanks , Karna Christmas

## 2014-04-29 ENCOUNTER — Encounter: Payer: Self-pay | Admitting: Family Medicine

## 2014-04-29 ENCOUNTER — Ambulatory Visit (INDEPENDENT_AMBULATORY_CARE_PROVIDER_SITE_OTHER): Payer: Medicare Other | Admitting: Family Medicine

## 2014-04-29 ENCOUNTER — Encounter: Payer: Medicare Other | Admitting: Family Medicine

## 2014-04-29 ENCOUNTER — Other Ambulatory Visit: Payer: Self-pay | Admitting: Family Medicine

## 2014-04-29 VITALS — BP 120/74 | HR 72 | Temp 98.1°F | Ht 63.25 in | Wt 170.5 lb

## 2014-04-29 DIAGNOSIS — E7800 Pure hypercholesterolemia, unspecified: Secondary | ICD-10-CM

## 2014-04-29 DIAGNOSIS — E2839 Other primary ovarian failure: Secondary | ICD-10-CM

## 2014-04-29 DIAGNOSIS — N3941 Urge incontinence: Secondary | ICD-10-CM

## 2014-04-29 DIAGNOSIS — Z7189 Other specified counseling: Secondary | ICD-10-CM

## 2014-04-29 DIAGNOSIS — M6289 Other specified disorders of muscle: Secondary | ICD-10-CM | POA: Insufficient documentation

## 2014-04-29 DIAGNOSIS — R030 Elevated blood-pressure reading, without diagnosis of hypertension: Secondary | ICD-10-CM

## 2014-04-29 DIAGNOSIS — R7309 Other abnormal glucose: Secondary | ICD-10-CM

## 2014-04-29 DIAGNOSIS — Z0000 Encounter for general adult medical examination without abnormal findings: Secondary | ICD-10-CM

## 2014-04-29 DIAGNOSIS — Z23 Encounter for immunization: Secondary | ICD-10-CM

## 2014-04-29 DIAGNOSIS — R202 Paresthesia of skin: Secondary | ICD-10-CM

## 2014-04-29 DIAGNOSIS — E348 Other specified endocrine disorders: Secondary | ICD-10-CM

## 2014-04-29 DIAGNOSIS — Z0001 Encounter for general adult medical examination with abnormal findings: Secondary | ICD-10-CM | POA: Insufficient documentation

## 2014-04-29 DIAGNOSIS — R7303 Prediabetes: Secondary | ICD-10-CM

## 2014-04-29 HISTORY — DX: Urge incontinence: N39.41

## 2014-04-29 LAB — TSH: TSH: 1.42 u[IU]/mL (ref 0.35–4.50)

## 2014-04-29 LAB — VITAMIN B12: VITAMIN B 12: 449 pg/mL (ref 211–911)

## 2014-04-29 MED ORDER — FESOTERODINE FUMARATE ER 4 MG PO TB24: 4.0000 mg | ORAL_TABLET | Freq: Every day | ORAL | Status: DC

## 2014-04-29 MED ORDER — LOVASTATIN 40 MG PO TABS: 40.0000 mg | ORAL_TABLET | Freq: Every day | ORAL | Status: DC

## 2014-04-29 MED ORDER — OMEPRAZOLE 20 MG PO CPDR: 20.0000 mg | DELAYED_RELEASE_CAPSULE | Freq: Every day | ORAL | Status: DC

## 2014-04-29 MED ORDER — NYSTATIN 100000 UNIT/GM EX CREA: TOPICAL_CREAM | Freq: Two times a day (BID) | CUTANEOUS | Status: DC

## 2014-04-29 NOTE — Assessment & Plan Note (Signed)
New issue. Pt with history of carpal tunnel. Will eval B12 and tsh for cause.  If neg start wearing brace on right hand. If not improiving consider referral for nerve conduction.
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
## 2014-04-29 NOTE — Patient Instructions (Addendum)
Work on low [employer], exercise and weight loss. Stop at front desk to set up bone density. Stop at lab on way out for B12 and thyroid test to eval numbness. Trial of toviaz  For urge incontinence. Start right sided carpal tunnel brace. Bring in copy of living will/HCPOA when you can.

## 2014-04-29 NOTE — Progress Notes (Signed)
Pre visit review using our clinic review tool, if applicable. No additional management support is needed unless otherwise documented below in the visit note. 

## 2014-04-29 NOTE — Assessment & Plan Note (Signed)
Trial of toviaz. No clear sign of infeciton.

## 2014-04-29 NOTE — Assessment & Plan Note (Signed)
At goal LDL , 40 mg daily.

## 2014-04-29 NOTE — Assessment & Plan Note (Signed)
Stable control. Work on low [employer], exercise and weight loss.

## 2014-04-29 NOTE — Assessment & Plan Note (Signed)
BP Readings from Last 3 Encounters:  04/29/14 120/74  12/24/13 124/72  12/10/13 120/78    Stable no problems.

## 2014-04-30 ENCOUNTER — Other Ambulatory Visit: Payer: Medicare Other

## 2014-05-06 ENCOUNTER — Encounter: Payer: Medicare Other | Admitting: Family Medicine

## 2014-05-06 ENCOUNTER — Other Ambulatory Visit: Payer: Self-pay | Admitting: *Deleted

## 2014-05-07 ENCOUNTER — Ambulatory Visit
Admission: RE | Admit: 2014-05-07 | Discharge: 2014-05-07 | Disposition: A | Discharge status: Home / Self Care | Payer: Medicare Other | Source: Ambulatory Visit | Attending: Family Medicine | Admitting: Family Medicine

## 2014-05-07 DIAGNOSIS — E2839 Other primary ovarian failure: Secondary | ICD-10-CM

## 2014-05-08 ENCOUNTER — Other Ambulatory Visit: Payer: Self-pay | Admitting: Family Medicine

## 2014-07-29 ENCOUNTER — Telehealth: Payer: Self-pay | Admitting: Family Medicine

## 2014-07-29 ENCOUNTER — Other Ambulatory Visit: Payer: Self-pay | Admitting: *Deleted

## 2014-07-29 MED ORDER — LOVASTATIN 40 MG PO TABS: 40.0000 mg | ORAL_TABLET | Freq: Every day | ORAL | Status: DC

## 2014-07-29 MED ORDER — FLUTICASONE PROPIONATE 50 MCG/ACT NA SUSP: 2.0000 | Freq: Every day | NASAL | Status: DC

## 2014-07-29 MED ORDER — OMEPRAZOLE 20 MG PO CPDR: 20.0000 mg | DELAYED_RELEASE_CAPSULE | Freq: Every day | ORAL | Status: DC

## 2014-07-29 MED ORDER — NABUMETONE 750 MG PO TABS: 750.0000 mg | ORAL_TABLET | Freq: Two times a day (BID) | ORAL | Status: DC | PRN

## 2014-07-29 NOTE — Telephone Encounter (Signed)
In response to ? In med refill about something other than toviaz. There is also detrol LA and many other meds. I would rec she call her insurance to determine which is lowest cost for her.  All are name brand. Oxybutynin ( only generic) has to be take 5 times a  Day and has a lot of sedation as SE

## 2014-07-29 NOTE — Telephone Encounter (Signed)
Left message for patient to return my call.

## 2014-07-29 NOTE — Telephone Encounter (Signed)
Ok to refill? Needs to go to [facility]. Also asks if there is a substitute for Toviaz. It is $131 for a 90 day supply. Please call patient at [phone] and let her know. Thanks!

## 2014-07-30 ENCOUNTER — Telehealth: Payer: Self-pay | Admitting: Family Medicine

## 2014-07-30 NOTE — Telephone Encounter (Signed)
Dana [name] notified as instructed by telephone.  She will call her insurance company and let us know if there are any medications that would be cheaper than the [place].

## 2014-07-30 NOTE — Telephone Encounter (Signed)
pt is returning your call. Please cal back at [phone] thanks

## 2014-07-30 NOTE — Telephone Encounter (Signed)
See Phone Note from 07/29/2014.

## 2014-07-31 NOTE — Addendum Note (Signed)
Addended by: Carter Kitten on: 07/31/2014 02:29 PM   Modules accepted: Orders, Medications

## 2014-11-13 ENCOUNTER — Emergency Department (HOSPITAL_COMMUNITY): Payer: Commercial Managed Care - HMO

## 2014-11-13 ENCOUNTER — Telehealth: Payer: Self-pay | Admitting: Family Medicine

## 2014-11-13 ENCOUNTER — Emergency Department (HOSPITAL_COMMUNITY)
Admission: EM | Admit: 2014-11-13 | Discharge: 2014-11-13 | Disposition: A | Discharge status: Home / Self Care | Payer: Commercial Managed Care - HMO | Attending: Emergency Medicine | Admitting: Emergency Medicine

## 2014-11-13 ENCOUNTER — Encounter (HOSPITAL_COMMUNITY): Payer: Self-pay | Admitting: Nurse Practitioner

## 2014-11-13 DIAGNOSIS — E785 Hyperlipidemia, unspecified: Secondary | ICD-10-CM | POA: Diagnosis not present

## 2014-11-13 DIAGNOSIS — N132 Hydronephrosis with renal and ureteral calculous obstruction: Secondary | ICD-10-CM | POA: Diagnosis not present

## 2014-11-13 DIAGNOSIS — Z862 Personal history of diseases of the blood and blood-forming organs and certain disorders involving the immune mechanism: Secondary | ICD-10-CM | POA: Insufficient documentation

## 2014-11-13 DIAGNOSIS — Z9049 Acquired absence of other specified parts of digestive tract: Secondary | ICD-10-CM | POA: Diagnosis not present

## 2014-11-13 DIAGNOSIS — Z8619 Personal history of other infectious and parasitic diseases: Secondary | ICD-10-CM | POA: Insufficient documentation

## 2014-11-13 DIAGNOSIS — Z8669 Personal history of other diseases of the nervous system and sense organs: Secondary | ICD-10-CM | POA: Diagnosis not present

## 2014-11-13 DIAGNOSIS — N201 Calculus of ureter: Secondary | ICD-10-CM | POA: Diagnosis not present

## 2014-11-13 DIAGNOSIS — M1990 Unspecified osteoarthritis, unspecified site: Secondary | ICD-10-CM | POA: Insufficient documentation

## 2014-11-13 DIAGNOSIS — Z7982 Long term (current) use of aspirin: Secondary | ICD-10-CM | POA: Insufficient documentation

## 2014-11-13 DIAGNOSIS — N1330 Unspecified hydronephrosis: Secondary | ICD-10-CM | POA: Diagnosis not present

## 2014-11-13 DIAGNOSIS — Z8719 Personal history of other diseases of the digestive system: Secondary | ICD-10-CM | POA: Diagnosis not present

## 2014-11-13 DIAGNOSIS — I700 Atherosclerosis of aorta: Secondary | ICD-10-CM | POA: Diagnosis not present

## 2014-11-13 DIAGNOSIS — Z90710 Acquired absence of both cervix and uterus: Secondary | ICD-10-CM | POA: Diagnosis not present

## 2014-11-13 DIAGNOSIS — Z7951 Long term (current) use of inhaled steroids: Secondary | ICD-10-CM | POA: Insufficient documentation

## 2014-11-13 DIAGNOSIS — Z79899 Other long term (current) drug therapy: Secondary | ICD-10-CM | POA: Insufficient documentation

## 2014-11-13 DIAGNOSIS — R109 Unspecified abdominal pain: Secondary | ICD-10-CM | POA: Diagnosis present

## 2014-11-13 DIAGNOSIS — I10 Essential (primary) hypertension: Secondary | ICD-10-CM | POA: Insufficient documentation

## 2014-11-13 LAB — COMPREHENSIVE METABOLIC PANEL
ALT: 32 U/L (ref 14–54)
ANION GAP: 9 (ref 5–15)
AST: 24 U/L (ref 15–41)
Albumin: 3.7 g/dL (ref 3.5–5.0)
Alkaline Phosphatase: 82 U/L (ref 38–126)
BILIRUBIN TOTAL: 0.7 mg/dL (ref 0.3–1.2)
BUN: 10 mg/dL (ref 6–20)
CO2: 27 mmol/L (ref 22–32)
CREATININE: 0.78 mg/dL (ref 0.44–1.00)
Calcium: 9.4 mg/dL (ref 8.9–10.3)
Chloride: 107 mmol/L (ref 101–111)
GFR calc Af Amer: >60 mL/min (ref >60)
Glucose, Bld: 120 mg/dL — ABNORMAL HIGH (ref 65–99)
POTASSIUM: 4.1 mmol/L (ref 3.5–5.1)
SODIUM: 143 mmol/L (ref 135–145)
Total Protein: 7.3 g/dL (ref 6.5–8.1)

## 2014-11-13 LAB — CBC WITH DIFFERENTIAL/PLATELET
BASOS ABS: 0 10*3/uL (ref 0.0–0.1)
BASOS PCT: 0 % (ref 0–1)
EOS ABS: 0.1 10*3/uL (ref 0.0–0.7)
Eosinophils Relative: 1 % (ref 0–5)
HCT: 38.8 % (ref 36.0–46.0)
HEMOGLOBIN: 13 g/dL (ref 12.0–15.0)
Lymphocytes Relative: 22 % (ref 12–46)
Lymphs Abs: 2 10*3/uL (ref 0.7–4.0)
MCH: 30.1 pg (ref 26.0–34.0)
MCHC: 33.5 g/dL (ref 30.0–36.0)
MCV: 89.8 fL (ref 78.0–100.0)
Monocytes Absolute: 0.7 10*3/uL (ref 0.1–1.0)
Monocytes Relative: 8 % (ref 3–12)
NEUTROS ABS: 6 10*3/uL (ref 1.7–7.7)
NEUTROS PCT: 69 % (ref 43–77)
PLATELETS: 306 10*3/uL (ref 150–400)
RBC: 4.32 MIL/uL (ref 3.87–5.11)
RDW: 13.3 % (ref 11.5–15.5)
WBC: 8.8 10*3/uL (ref 4.0–10.5)

## 2014-11-13 LAB — URINALYSIS, ROUTINE W REFLEX MICROSCOPIC
Bilirubin Urine: NEGATIVE
Glucose, UA: NEGATIVE mg/dL
Ketones, ur: NEGATIVE mg/dL
NITRITE: NEGATIVE
Protein, ur: 30 mg/dL — AB
SPECIFIC GRAVITY, URINE: 1.017 (ref 1.005–1.030)
UROBILINOGEN UA: 0.2 mg/dL (ref 0.0–1.0)
pH: 7.5 (ref 5.0–8.0)

## 2014-11-13 LAB — URINE MICROSCOPIC-ADD ON

## 2014-11-13 LAB — LIPASE, BLOOD: Lipase: 22 U/L (ref 22–51)

## 2014-11-13 MED ORDER — MORPHINE SULFATE 4 MG/ML IJ SOLN
4.0000 mg | Freq: Once | INTRAMUSCULAR | Status: AC
Start: 2014-11-13 — End: 2014-11-13
  Administered 2014-11-13: 4 mg via INTRAVENOUS
  Filled 2014-11-13: qty 1

## 2014-11-13 MED ORDER — SODIUM CHLORIDE 0.9 % IV SOLN
1000.0000 mL | INTRAVENOUS | Status: DC
  Administered 2014-11-13: 1000 mL via INTRAVENOUS

## 2014-11-13 MED ORDER — OXYCODONE-ACETAMINOPHEN 5-325 MG PO TABS: 1.0000 | ORAL_TABLET | Freq: Four times a day (QID) | ORAL | Status: DC | PRN

## 2014-11-13 MED ORDER — IOHEXOL 300 MG/ML  SOLN
25.0000 mL | Freq: Once | INTRAMUSCULAR | Status: AC | PRN
  Administered 2014-11-13: 25 mL via ORAL

## 2014-11-13 MED ORDER — IOHEXOL 300 MG/ML  SOLN
100.0000 mL | Freq: Once | INTRAMUSCULAR | Status: AC | PRN
  Administered 2014-11-13: 100 mL via INTRAVENOUS

## 2014-11-13 MED ORDER — ONDANSETRON 8 MG PO TBDP
8.0000 mg | ORAL_TABLET | Freq: Three times a day (TID) | ORAL | Status: DC | PRN
Start: 2014-11-13 — End: 2015-01-14

## 2014-11-13 NOTE — Discharge Instructions (Signed)
Hydronephrosis Hydronephrosis is an abnormal enlargement of your kidney. It can affect one or both the kidneys. It results from the backward pressure of urine on the kidneys, when the flow of urine is blocked. Normally, the urine drains from the kidney through the urine tube (ureter), into a sac which holds the urine until urination (bladder). When the urinary flow is blocked, the urine collects above the block. This causes an increase in the pressure inside the kidney, which in turn leads to its enlargement. The block can occur at the point where the kidney joins the ureter. Treatment depends on the cause and location of the block.  CAUSES  The causes of this condition include:  Birth defect of the kidney or ureter.  Kink at the point where the kidney joins the ureter.  Stones and blood clots in the kidney or ureter.  Cancer, injury, or infection of the ureter.  Scar tissue formation.  Backflow of urine (reflux).  Cancer of bladder or prostate gland.  Abnormality of the nerves or muscles of the kidney or ureter.  Lower part of the ureter protruding into the bladder (ureterocele).  Abnormal contractions of the bladder.  Both the kidneys can be affected during pregnancy. This is because the enlarging uterus presses on the ureters and blocks the flow of urine. SYMPTOMS  The symptoms depend on the location of the block. They also depend on how long the block has been present. You may feel pain on the affected side. Sometimes, you may not have any symptoms. There may be a dull ache or discomfort in the flank. The common symptoms are:  Flank pain.  Swelling of the abdomen.  Pain in the abdomen.  Nausea and vomiting.  Fever.  Pain while passing urine.  Urgency for urination.  Frequent or urgent urination.  Infection of the urinary tract. DIAGNOSIS  Your caregiver will examine you after asking about your symptoms. You may be asked to do blood and urine tests. Your caregiver  may order a special X-ray, ultrasound, or CT scan. Sometimes a rigid or flexible telescope (cystoscope) is used to view the site of the blockage.  TREATMENT  Treatment depends on the site, cause, and duration of the block. The goal of treatment is to remove the blockage. Your caregiver will plan the treatment based on your condition. The different types of treatment are:   Putting in a soft plastic tube (ureteral stent) to connect the bladder with the kidney. This will help in draining the urine.  Putting in a soft tube (nephrostomy tube). This is placed through skin into the kidney. The trapped urine is drained out through the back. A plastic bag is attached to your skin to hold the urine that has drained out.  Antibiotics to treat or prevent infection.  Breaking down of the stone (lithotripsy). HOME CARE INSTRUCTIONS   It may take some time for the hydronephrosis to go away (resolve). Drink fluids as directed by your caregiver , and get a lot of rest.  If you have a drain in, your caregiver will give you directions about how to care for it. Be sure you understand these directions completely before you go home.  Take any antibiotics, pain medications, or other prescriptions exactly as prescribed.  Follow-up with your caregivers as directed. SEEK MEDICAL CARE IF:   You continue to have flank pain, nausea, or difficulty with urination.  You have any problem with any type of drainage device.  Your urine becomes cloudy or bloody. SEEK  IMMEDIATE MEDICAL CARE IF:   You have severe flank and/or abdominal pain.  You develop vomiting and are unable to hold down fluids.  You develop a fever above 100.5 F (38.1 C), or as per your caregiver. MAKE SURE YOU:   Understand these instructions.  Will watch your condition.  Will get help right away if you are not doing well or get worse. Document Released: 02/20/2007 Document Revised: 07/18/2011 Document Reviewed: 04/08/2010 [facility]  Patient Information 2015 [place], [place]. This information is not intended to replace advice given to you by your health care provider. Make sure you discuss any questions you have with your health care provider.  Kidney Stones Kidney stones (urolithiasis) are deposits that form inside your kidneys. The intense pain is caused by the stone moving through the urinary tract. When the stone moves, the ureter goes into spasm around the stone. The stone is usually passed in the urine.  CAUSES   A disorder that makes certain neck glands produce too much parathyroid hormone (primary hyperparathyroidism).  A buildup of uric acid crystals, similar to gout in your joints.  Narrowing (stricture) of the ureter.  A kidney obstruction present at birth (congenital obstruction).  Previous surgery on the kidney or ureters.  Numerous kidney infections. SYMPTOMS   Feeling sick to your stomach (nauseous).  Throwing up (vomiting).  Blood in the urine (hematuria).  Pain that usually spreads (radiates) to the groin.  Frequency or urgency of urination. DIAGNOSIS   Taking a history and physical exam.  Blood or urine tests.  CT scan.  Occasionally, an examination of the inside of the urinary bladder (cystoscopy) is performed. TREATMENT   Observation.  Increasing your fluid intake.  Extracorporeal shock wave lithotripsy--This is a noninvasive procedure that uses shock waves to break up kidney stones.  Surgery may be needed if you have severe pain or persistent obstruction. There are various surgical procedures. Most of the procedures are performed with the use of small instruments. Only small incisions are needed to accommodate these instruments, so recovery time is minimized. The size, location, and chemical composition are all important variables that will determine the proper choice of action for you. Talk to your health care provider to better understand your situation so that you will minimize  the risk of injury to yourself and your kidney.  HOME CARE INSTRUCTIONS   Drink enough water and fluids to keep your urine clear or pale yellow. This will help you to pass the stone or stone fragments.  Strain all urine through the provided strainer. Keep all particulate matter and stones for your health care provider to see. The stone causing the pain may be as small as a grain of salt. It is very important to use the strainer each and every time you pass your urine. The collection of your stone will allow your health care provider to analyze it and verify that a stone has actually passed. The stone analysis will often identify what you can do to reduce the incidence of recurrences.  Only take over-the-counter or prescription medicines for pain, discomfort, or fever as directed by your health care provider.  Make a follow-up appointment with your health care provider as directed.  Get follow-up X-rays if required. The absence of pain does not always mean that the stone has passed. It may have only stopped moving. If the urine remains completely obstructed, it can cause loss of kidney function or even complete destruction of the kidney. It is your responsibility to make sure  X-rays and follow-ups are completed. Ultrasounds of the kidney can show blockages and the status of the kidney. Ultrasounds are not associated with any radiation and can be performed easily in a matter of minutes. SEEK MEDICAL CARE IF:  You experience pain that is progressive and unresponsive to any pain medicine you have been prescribed. SEEK IMMEDIATE MEDICAL CARE IF:   Pain cannot be controlled with the prescribed medicine.  You have a fever or shaking chills.  The severity or intensity of pain increases over 18 hours and is not relieved by pain medicine.  You develop a new onset of abdominal pain.  You feel faint or pass out.  You are unable to urinate. MAKE SURE YOU:   Understand these instructions.  Will  watch your condition.  Will get help right away if you are not doing well or get worse. Document Released: 04/25/2005 Document Revised: 12/26/2012 Document Reviewed: 09/26/2012 [facility] Patient Information 2015 [place], [place]. This information is not intended to replace advice given to you by your health care provider. Make sure you discuss any questions you have with your health care provider.

## 2014-11-13 NOTE — ED Notes (Signed)
Pt endorses left posterior flank pain that shoots to the anterior side and down to lower abdomen x2days. Pt denies N/V/D/vaginal discharge or bleeding. Patient denies dysuria. No CVA tenderness noted. Patient ambulatory without guarding. Pt. Has had appendectomy and cholestectomy in past.

## 2014-11-13 NOTE — ED Notes (Signed)
CT informed patient has finished contrast.

## 2014-11-13 NOTE — ED Provider Notes (Signed)
CSN: [id number]     Arrival date & time 11/13/14  1000 History   First MD Initiated Contact with Patient 11/13/14 1001     Chief Complaint  Patient presents with  . Flank Pain   Patient is a 69 y.o. female presenting with flank pain. The history is provided by the patient.  Flank Pain This is a new problem. The current episode started more than 2 days ago. The problem has not changed since onset.Associated symptoms include abdominal pain. Pertinent negatives include no chest pain, no headaches and no shortness of breath. Nothing aggravates the symptoms. Nothing relieves the symptoms. Treatments tried: she is taking her usual pain medications for her arm which helps.  Pain is sharp. Pain is in the left flank but also in the left lower quadrant.  Patient did notice hard stools the other day. Otherwise she has not had any trouble with diarrhea or other changes in her bowel habits. She has not noticed any blood in her stools. She denies any difficulty with urination. She does have a history of diverticulosis and diverticulitis but usually she will have diarrhea and vomiting when that occurs. She called her doctor was instructed to come to the emergency room to be evaluated.   Past Medical History  Diagnosis Date  . Anemia   . Arthritis   . Allergy   . Hyperlipidemia   . Migraine   . History of chicken pox   . Diverticulitis   . Hypertension    Past Surgical History  Procedure Laterality Date  . Appendectomy  1970  . Breast surgery  1995    biopsy  . Cholecystectomy  1971  . Tubal ligation    . Abdominal hysterectomy  2006    cervix remains, menorrhagia   Family History  Problem Relation Age of Onset  . Heart disease Mother 19  . Hyperlipidemia Mother   . Hyperlipidemia Father   . Stroke Father   . Heart disease Father   . Fibromyalgia Sister   . Hyperlipidemia Sister   . Heart disease Brother   . Diabetes Brother    History  Substance Use Topics  . Smoking status: Never  Smoker   . Smokeless tobacco: Never Used  . Alcohol Use: No   OB History    No data available     Review of Systems  Respiratory: Negative for shortness of breath.   Cardiovascular: Negative for chest pain.  Gastrointestinal: Positive for abdominal pain.  Genitourinary: Positive for flank pain.  Neurological: Negative for headaches.  All other systems reviewed and are negative.     Allergies  Niacin and related and Septra  Home Medications   Prior to Admission medications   Medication Sig Start Date End Date Taking? Authorizing Provider  aspirin 81 MG tablet Take 81 mg by mouth daily.    Historical Provider, MD  B Complex Vitamins (B-COMPLEX/B-12) TABS Take by mouth. Take 1000mg  daily    Historical Provider, MD  cholecalciferol (VITAMIN D) 1000 UNITS tablet Take 1,000 Units by mouth daily.    Historical Provider, MD  Coenzyme Q10 60 MG CAPS Take 1 capsule by mouth daily.    Historical Provider, MD  fluticasone (FLONASE) 50 MCG/ACT nasal spray Place 2 sprays into both nostrils daily. 07/29/14   Amy Cletis Athens, MD  furosemide (LASIX) 20 MG tablet Take 1 tablet (20 mg total) by mouth as needed. 12/10/13   Amy Cletis Athens, MD  Krill Oil 300 MG CAPS Take 1 capsule by mouth  daily.    Historical Provider, MD  lactase (LACTAID) 3000 UNITS tablet Take 1 tablet by mouth as needed.     Historical Provider, MD  lovastatin (MEVACOR) 40 MG tablet Take 1 tablet (40 mg total) by mouth at bedtime. 07/29/14   Amy Cletis Athens, MD  Multiple Vitamin (MULTIVITAMIN) tablet Take 1 tablet by mouth daily.    Historical Provider, MD  nabumetone (RELAFEN) 750 MG tablet Take 1 tablet (750 mg total) by mouth 2 (two) times daily as needed. 07/29/14   Amy Cletis Athens, MD  nystatin cream (MYCOSTATIN) Apply topically 2 (two) times daily. Continue for 48 hours after rash resolved. 04/29/14   Amy Cletis Athens, MD  omeprazole (PRILOSEC) 20 MG capsule Take 1 capsule (20 mg total) by mouth daily. 07/29/14   Amy Cletis Athens, MD   ondansetron (ZOFRAN ODT) 8 MG disintegrating tablet Take 1 tablet (8 mg total) by mouth every 8 (eight) hours as needed for nausea or vomiting. 11/13/14   Dorie Rank, MD  oxyCODONE-acetaminophen (PERCOCET/ROXICET) 5-325 MG per tablet Take 1 tablet by mouth every 6 (six) hours as needed. 11/13/14   Dorie Rank, MD  tolterodine (DETROL LA) 4 MG 24 hr capsule Take 4 mg by mouth daily.    Historical Provider, MD  Turmeric Curcumin 500 MG CAPS Take 1 tablet by mouth daily.    Historical Provider, MD  VAGIFEM 10 MCG TABS vaginal tablet USE 1 TAB VAGINALLY 2 TIMES A WEEK 05/11/14   Amy E Bedsole, MD   BP 147/59 mmHg  Pulse 68  Temp(Src) 97.9 F (36.6 C) (Oral)  Resp 16  SpO2 97% Physical Exam  Constitutional: She appears well-developed and well-nourished. No distress.  HENT:  Head: Normocephalic and atraumatic.  Right Ear: External ear normal.  Left Ear: External ear normal.  Eyes: Conjunctivae are normal. Right eye exhibits no discharge. Left eye exhibits no discharge. No scleral icterus.  Neck: Neck supple. No tracheal deviation present.  Cardiovascular: Normal rate, regular rhythm and intact distal pulses.   Pulmonary/Chest: Effort normal and breath sounds normal. No stridor. No respiratory distress. She has no wheezes. She has no rales.  Abdominal: Soft. Bowel sounds are normal. She exhibits no distension. There is tenderness in the left upper quadrant. There is CVA tenderness (left side). There is no rebound and no guarding.  Musculoskeletal: She exhibits no edema or tenderness.  Neurological: She is alert. She has normal strength. No cranial nerve deficit (no facial droop, extraocular movements intact, no slurred speech) or sensory deficit. She exhibits normal muscle tone. She displays no seizure activity. Coordination normal.  Skin: Skin is warm and dry. No rash noted.  Psychiatric: She has a normal mood and affect.  Nursing note and vitals reviewed.   ED Course  Procedures (including  critical care time) Labs Review Labs Reviewed  COMPREHENSIVE METABOLIC PANEL - Abnormal; Notable for the following:    Glucose, Bld 120 (*)    All other components within normal limits  URINALYSIS, ROUTINE W REFLEX MICROSCOPIC (NOT AT [facility]) - Abnormal; Notable for the following:    Hgb urine dipstick TRACE (*)    Protein, ur 30 (*)    Leukocytes, UA SMALL (*)    All other components within normal limits  URINE MICROSCOPIC-ADD ON - Abnormal; Notable for the following:    Squamous Epithelial / LPF FEW (*)    Bacteria, UA FEW (*)    All other components within normal limits  URINE CULTURE  CBC WITH DIFFERENTIAL/PLATELET  LIPASE,  BLOOD    Imaging Review Ct Abdomen Pelvis W Contrast  11/13/2014   CLINICAL DATA:  26 old hypertensive female with hyperlipidemia presenting with left posterior flank pain extending to the anterior for the past 2 days. Prior appendectomy, cholecystectomy and total hysterectomy. Initial encounter.  EXAM: CT ABDOMEN AND PELVIS WITH CONTRAST  TECHNIQUE: Multidetector CT imaging of the abdomen and pelvis was performed using the standard protocol following bolus administration of intravenous contrast.  CONTRAST:  135mL OMNIPAQUE IOHEXOL 300 MG/ML  SOLN  COMPARISON:  None.  FINDINGS: 1 cm calcified granuloma right lung base. Calcified subcarinal lymph nodes and right hilar lymph nodes. Splenic calcifications. Findings consistent prior granulomatous exposure.  Cardiomegaly.  5 x 7 x 9 mm proximal left ureteral obstructing stone with moderate left-sided hydronephrosis. This is located 18 cm proximal to the left ureteral vesical junction.  Left renal 1 cm cyst. 4 mm right renal low-density structure possibly a cyst although too small to characterize.  Scattered colonic diverticula most notable sigmoid colon without evidence of extra luminal bowel inflammatory process, free fluid or free air.  Dilated intrahepatic and extrahepatic biliary ducts with common bile duct  measuring up to 16.9 mm. Patient is post cholecystectomy however, common bile duct is still felt to be enlarged despite the post cholecystectomy state. No calcified common bile duct stone or pancreatic mass is noted. Correlation with laboratory studies and possibly follow-up ERCP may be considered.  Inferior aspect right lobe liver enhancing lesions suggestive of 1.1 cm hemangioma. Left lobe liver 1.3 cm low-density structure may represent a cyst. Anterior left lobe liver 4 mm structure may also represent a cyst although too small to characterize.  No adrenal lesion.  Atherosclerotic type changes of the abdominal aorta, left renal artery and iliac arteries without aneurysmal dilation or high-grade stenosis.  Post hysterectomy. Noncontrast filled views of the urinary bladder unremarkable.  Slight asymmetric breast parenchyma incompletely assessed by CT.  No osseous destructive lesion. Degenerative changes most notable L4-5 followed by the L5-S1 level.  No adenopathy.  IMPRESSION: 5 x 7 x 9 mm proximal left ureteral obstructing stone with moderate left-sided hydronephrosis. This is located 18 cm proximal to the left ureteral vesical junction.  Scattered colonic diverticula most notable sigmoid colon without evidence of extra luminal bowel inflammatory process, free fluid or free air.  Dilated intrahepatic and extrahepatic biliary ducts with common bile duct measuring up to 16.9 mm. Patient is post cholecystectomy however, common bile duct is still felt to be enlarged despite the post cholecystectomy state. No calcified common bile duct stone or pancreatic mass is noted. Correlation with laboratory studies and possibly follow-up ERCP may be considered.  Inferior aspect right lobe liver enhancing lesion suggestive of 1.1 cm hemangioma. Left lobe liver 1.3 cm low-density structure may represent a cyst. Anterior left lobe liver 4 mm structure may also represent a cyst although too small to characterize.  Evidence of prior  granulomatous exposure as detailed above.  Atherosclerotic type changes of the abdominal aorta, left renal artery and iliac arteries without aneurysmal dilation or high-grade stenosis.  Slight asymmetric breast parenchyma incompletely assessed by CT.  Degenerative changes most notable L4-5 followed by the L5-S1 level.   Electronically Signed   By: Genia Del M.D.   On: 11/13/2014 13:08    Medications  0.9 %  sodium chloride infusion (1,000 mLs Intravenous New Bag/Given 11/13/14 1030)  morphine 4 MG/ML injection 4 mg (4 mg Intravenous Given 11/13/14 1125)  iohexol (OMNIPAQUE) 300 MG/ML solution 25  mL (25 mLs Oral Contrast Given 11/13/14 1138)  iohexol (OMNIPAQUE) 300 MG/ML solution 100 mL (100 mLs Intravenous Contrast Given 11/13/14 1213)     MDM  No urinary symptoms.  Few WBC and bacteria in the urine.  SX suggestive of possible diverticulitis.  Will ct to evaluate further  Final diagnoses:  Ureteral stone with hydronephrosis    Discussed incidental bile duct dilatation discussed with the patient.  Discussed outpatient follow up.  Pt's CT scan does show a large ureteral stone.  Remains pain free after treatment.  Urine cult ordered. Discussed case with Dr Risa Grill.  Will refer for outpatient follow up.  Rx for pain meds.    Dorie Rank, MD 11/13/14 1520

## 2014-11-13 NOTE — Telephone Encounter (Signed)
[facility] Patient Name: Dana [name] Gender: Female DOB: [birth year] Age: 69 Y 9 M 19 D Return Phone Number: [phone] (Primary), [phone] (Secondary) Address: [address] City/State/Zip: [place] [address] Client [facility] - Client Client Site [place] - Day Physician Diona Browner, [place] Contact Type Call Call Type Triage / Clinical Relationship To Patient Self Return Phone Number [phone] (Primary) Chief Complaint Back Pain - General Initial Comment Caller states wants to be seen today; past 3 days having left back pain; PreDisposition Did not know what to do Nurse Assessment Nurse: Mechele Dawley, RN, Amy Date/Time Eilene Ghazi Time): 11/13/2014 9:16:19 AM Confirm and document reason for call. If symptomatic, describe symptoms. ---CALLER STATES THAT SHE HAS BEEN HAVING LEFT SIDE AND LEFT BACK PAIN FOR 3 DAYS. SHE DOES NOT KNOW WHY IT IS HURTING. SHE STATES THE PAIN IS A 10/10. SHE IS TAKING PAIN MEDICATION EVERY 4 HOURS AND THE PAIN MEDS WILL WEAR OFF AND THIS COME BACK. SHE IS NOT SURE WHAT IS GOING ON. SHE IS NOT HAVING ANY TROUBLE WITH HER BOWELS AT THIS TIME. SHES HAD CONSTIPATION A COUPLE OF TIMES THIS MONTH. SHE DOES NOT FEEL LIKE IT IS CONSTIPATION. Has the patient traveled out of the country within the last 30 days? ---Not Applicable Does the patient require triage? ---Yes Related visit to physician within the last 2 weeks? ---No Does the PT have any chronic conditions? (i.e. diabetes, asthma, etc.) ---Yes List chronic conditions. ---HYPERTENSION, PRE-DIABETES, DIVERTICULOSIS, Guidelines Guideline Title Affirmed Question Affirmed Notes Nurse Date/Time (Eastern Time) Back Pain [1] Abdominal pain AND [2] age > 22 [place], [place], Amy 11/13/2014 9:17:46 AM Disp. Time Eilene Ghazi Time) Disposition Final User 11/13/2014 9:19:46  AM Go to ED Now (or PCP triage) Yes Mechele Dawley, RN, Amy

## 2014-11-15 LAB — URINE CULTURE: SPECIAL REQUESTS: NORMAL

## 2014-11-17 ENCOUNTER — Other Ambulatory Visit: Payer: Self-pay | Admitting: Urology

## 2014-11-17 DIAGNOSIS — N201 Calculus of ureter: Secondary | ICD-10-CM | POA: Diagnosis not present

## 2014-11-19 ENCOUNTER — Encounter (HOSPITAL_COMMUNITY): Payer: Self-pay | Admitting: General Practice

## 2014-11-19 NOTE — H&P (Signed)
Reason For Visit Left ureteral stone   History of Present Illness 12F who follows up from the ED where she presented on 11/13/14 with left flank/abdominal pain. Her work-up revealed a 5x7x69mm stone in the mid-left ureter. Her urine culture was multiflora. Her renal function was normal. She had no evidence of infection. She was discharged home with medical explusion therapy. In the interval the patient states that she has had less pain, but a constant pressure. Her pain seems to be worse with movement. She feels better when she lays down. She has been taking hydrocodone for her pain. She denies any significant nausea. She denies any dysuria or gross hematuria. She denies any frequency or urgency. The patient has never had a kidney stone before. She has no family history of stones. She no history of recurrent urinary tract infections.   Past Medical History Problems  1. History of diabetes mellitus (Z86.39) 2. History of esophageal reflux (Z87.19) 3. History of hypercholesterolemia (Z86.39) 4. History of hypertension (Z86.79)  Surgical History Problems  1. History of Appendectomy 2. History of Gallbladder Surgery 3. History of Hysterectomy 4. History of Tubal Ligation  Current Meds 1. Aspirin Low Dose 81 MG TABS;  Therapy: (Recorded:11Jul2016) to Recorded 2. B Complex CAPS;  Therapy: (Recorded:11Jul2016) to Recorded 3. CoQ10 10 MG CAPS;  Therapy: (Recorded:11Jul2016) to Recorded 4. Detrol LA 4 MG Oral Capsule Extended Release 24 Hour;  Therapy: (Recorded:11Jul2016) to Recorded 5. Fluticasone Propionate 50 MCG/ACT Nasal Suspension;  Therapy: (Recorded:11Jul2016) to Recorded 6. Furosemide 20 MG Oral Tablet;  Therapy: (Recorded:11Jul2016) to Recorded 7. Hydrocodone-Acetaminophen 5-325 MG Oral Tablet;  Therapy: (Recorded:11Jul2016) to Recorded 8. Lactase TABS;  Therapy: (Recorded:11Jul2016) to Recorded 9. Multi-Day TABS;  Therapy: (Recorded:11Jul2016) to Recorded 10. Nabumetone 750  MG Oral Tablet;   Therapy: (Recorded:11Jul2016) to Recorded 11. Nystatin 100000 UNIT/GM External Cream;   Therapy: (Recorded:11Jul2016) to Recorded 12. Omega-3 Krill Oil CAPS;   Therapy: (Recorded:11Jul2016) to Recorded 13. Omeprazole 20 MG Oral Capsule Delayed Release;   Therapy: (Recorded:11Jul2016) to Recorded 14. Turmeric CAPS;   Therapy: (Recorded:11Jul2016) to Recorded 15. Vagifem 10 MCG Vaginal Tablet;   Therapy: (Recorded:11Jul2016) to Recorded 16. Vitamin D 1000 UNIT Oral Tablet;   Therapy: (Recorded:11Jul2016) to Recorded  Allergies Medication  1. Niacin TABS 2. Septra DS TABS  Family History Problems  1. Family history of Deceased : Mother, Father  Social History Problems    Denied: History of Alcohol use   Denied: History of Caffeine use   Married   Never a smoker  Review of Systems  Genitourinary: urinary frequency, nocturia and urinary hesitancy.  Gastrointestinal: nausea, vomiting, heartburn and constipation.  Constitutional: feeling tired (fatigue).  Eyes: blurred vision.  ENT: sinus problems.  Musculoskeletal: joint pain.  Neurological: headache.    Vitals Vital Signs [Data Includes: Last 1 Day]  Recorded: 11Jul2016 12:24PM  Height: 5 ft 3 in Weight: 170 lb  BMI Calculated: 30.11 BSA Calculated: 1.8 Blood Pressure: 158 / 85 Heart Rate: 86  Physical Exam Constitutional: Well nourished and well developed . No acute distress.  ENT:. The ears and nose are normal in appearance.  Neck: The appearance of the neck is normal and no neck mass is present.  Pulmonary: No respiratory distress and normal respiratory rhythm and effort.  Cardiovascular: Heart rate and rhythm are normal . No peripheral edema.  Abdomen: No masses are palpated. Moderate tenderness in the LLQ is present. Left CVA tenderness. No hernias are palpable. No hepatosplenomegaly noted.  Lymphatics: The femoral and inguinal nodes are  not enlarged or tender.  Skin: Normal skin  turgor, no visible rash and no visible skin lesions.  Neuro/Psych:. Mood and affect are appropriate.    Results/Data Urine [Data Includes: Last 1 Day]   [id number]  COLOR YELLOW   APPEARANCE CLEAR   SPECIFIC GRAVITY 1.010   pH 6.0   GLUCOSE NEG mg/dL  BILIRUBIN NEG   KETONE NEG mg/dL  BLOOD SMALL   PROTEIN NEG mg/dL  UROBILINOGEN 0.2 mg/dL  NITRITE NEG   LEUKOCYTE ESTERASE NEG   SQUAMOUS EPITHELIAL/HPF FEW   WBC 0-2 WBC/hpf  RBC 0-2 RBC/hpf  BACTERIA RARE   CRYSTALS NONE SEEN   CASTS NONE SEEN    Patient's urinalysis today is normal   KUB was performed today in clinic to evaluate for the patient's stone location. The KUB is somewhat obscured by contrast within the patient's descending colon. However, the renal shadows are present bilaterally. The previously seen stone in the left mid ureter is present at the level of L3 over the transverse process. This is been marked with an arrow. Numerous pelvic phleboliths. There are no additional stones within the expected trajectory of the urinary tract bilaterally.   Assessment Assessed  1. Left ureteral calculus (N20.1) 2. Family history of Deceased : Mother, Father  The patient has a 6 mm x 8 mm stone in the left mid ureter. It is not moved since her visit to the emergency room. However, her pain is reasonably controlled with the oral pain medications at this time. She has no evidence of infection.   Plan Health Maintenance  1. UA With REFLEX; [Do Not Release]; Status:Complete;   Done: [id number] 12:13PM Left ureteral calculus  2. Start: Tamsulosin HCl - 0.4 MG Oral Capsule; TAKE 1 CAPSULE Daily 3. Changed: From  Hydrocodone-Acetaminophen 5-325 MG Oral Tablet  To  Hydrocodone-Acetaminophen 5-325 MG Oral Tablet TAKE 1 TO 2 TABLETS EVERY 4  TO 6 HOURS AS NEEDED FOR PAIN 4. KUB; Status:Resulted - Requires Verification;   Done: [id number] 12:55PM 5. URINE CULTURE; Status:Hold For - Specimen/Data Collection,Appointment; Requested   for:11Jul2016;   Discussion/Summary We discussed management options including medical expulsion therapy, shockwave lithotripsy, and ureteroscopy. Ultimately, the patient has opted for shock wave lithotripsy. I discussed with the patient the procedure in detail as well as the risk and benefits. The patient is aware that she may need additional procedures. She also is aware of the risks of hematoma and pain. We will try to get this patient's scheduled as soon as possible. I refilled the patient's pain medication and written a prescription for tamsulosin which she should start on the day of her procedure.  Cc: Dr. Diona Browner, M.D.   Signatures Electronically signed by : Louis Meckel, M.D.; Nov 17 2014  1:31PM EST

## 2014-11-20 ENCOUNTER — Ambulatory Visit (HOSPITAL_COMMUNITY): Payer: Commercial Managed Care - HMO

## 2014-11-20 ENCOUNTER — Encounter (HOSPITAL_COMMUNITY)
Admission: RE | Disposition: A | Discharge status: Home / Self Care | Payer: Self-pay | Source: Ambulatory Visit | Attending: Urology

## 2014-11-20 ENCOUNTER — Encounter (HOSPITAL_COMMUNITY): Payer: Self-pay | Admitting: *Deleted

## 2014-11-20 ENCOUNTER — Ambulatory Visit (HOSPITAL_COMMUNITY)
Admission: RE | Admit: 2014-11-20 | Discharge: 2014-11-20 | Disposition: A | Discharge status: Home / Self Care | Payer: Commercial Managed Care - HMO | Source: Ambulatory Visit | Attending: Urology | Admitting: Urology

## 2014-11-20 DIAGNOSIS — Z79890 Hormone replacement therapy (postmenopausal): Secondary | ICD-10-CM | POA: Insufficient documentation

## 2014-11-20 DIAGNOSIS — Z7982 Long term (current) use of aspirin: Secondary | ICD-10-CM | POA: Insufficient documentation

## 2014-11-20 DIAGNOSIS — K219 Gastro-esophageal reflux disease without esophagitis: Secondary | ICD-10-CM | POA: Diagnosis not present

## 2014-11-20 DIAGNOSIS — I10 Essential (primary) hypertension: Secondary | ICD-10-CM | POA: Insufficient documentation

## 2014-11-20 DIAGNOSIS — Z01818 Encounter for other preprocedural examination: Secondary | ICD-10-CM | POA: Diagnosis not present

## 2014-11-20 DIAGNOSIS — N201 Calculus of ureter: Secondary | ICD-10-CM

## 2014-11-20 DIAGNOSIS — Z79899 Other long term (current) drug therapy: Secondary | ICD-10-CM | POA: Insufficient documentation

## 2014-11-20 DIAGNOSIS — E780 Pure hypercholesterolemia: Secondary | ICD-10-CM | POA: Insufficient documentation

## 2014-11-20 DIAGNOSIS — Z7951 Long term (current) use of inhaled steroids: Secondary | ICD-10-CM | POA: Insufficient documentation

## 2014-11-20 DIAGNOSIS — Z79891 Long term (current) use of opiate analgesic: Secondary | ICD-10-CM | POA: Diagnosis not present

## 2014-11-20 DIAGNOSIS — E119 Type 2 diabetes mellitus without complications: Secondary | ICD-10-CM | POA: Diagnosis not present

## 2014-11-20 HISTORY — DX: Calculus of kidney: N20.0

## 2014-11-20 SURGERY — LITHOTRIPSY, ESWL
Anesthesia: LOCAL | Laterality: Left

## 2014-11-20 MED ORDER — SODIUM CHLORIDE 0.9 % IV SOLN
INTRAVENOUS | Status: DC
  Administered 2014-11-20: 250 mL via INTRAVENOUS

## 2014-11-20 MED ORDER — DIPHENHYDRAMINE HCL 25 MG PO CAPS
25.0000 mg | ORAL_CAPSULE | ORAL | Status: AC
  Administered 2014-11-20: 25 mg via ORAL
  Filled 2014-11-20: qty 1

## 2014-11-20 MED ORDER — CEFAZOLIN SODIUM-DEXTROSE 2-3 GM-% IV SOLR
2.0000 g | INTRAVENOUS | Status: AC
  Administered 2014-11-20: 2 g via INTRAVENOUS
  Filled 2014-11-20: qty 50

## 2014-11-20 MED ORDER — DIAZEPAM 5 MG PO TABS
10.0000 mg | ORAL_TABLET | ORAL | Status: AC
  Administered 2014-11-20: 10 mg via ORAL
  Filled 2014-11-20: qty 2

## 2014-11-20 NOTE — Op Note (Signed)
Please see scanned chart for [facility] operative note. 

## 2014-11-20 NOTE — Discharge Instructions (Signed)
1. You should strain your urine and collect all fragments and bring them to your follow up appointment.  °2. You should take your pain medication as needed.  Please call if your pain is severe to the point that it is not controlled with your pain medication. °3. You should call if you develop fever > 101 or persistent nausea or vomiting. °4. Your doctor may prescribe tamsulosin to take to help facilitate stone passage. °

## 2014-11-20 NOTE — Interval H&P Note (Signed)
History and Physical Interval Note:  11/20/2014 9:38 AM  Dana [name]  has presented today for surgery, with the diagnosis of LEFT MID URETERAL STONE  The various methods of treatment have been discussed with the patient and family. After consideration of risks, benefits and other options for treatment, the patient has consented to  Procedure(s): LEFT EXTRACORPOREAL SHOCK WAVE LITHOTRIPSY (ESWL) (Left) as a surgical intervention .  The patient's history has been reviewed, patient examined, no change in status, stable for surgery.  I have reviewed the patient's chart and labs.  Questions were answered to the patient's satisfaction.     [name],LES

## 2014-11-20 NOTE — Progress Notes (Signed)
Left flank area with softball sized area of pinkness and few blood blisters in center.

## 2014-12-08 ENCOUNTER — Ambulatory Visit: Payer: Medicare Other | Admitting: Nurse Practitioner

## 2014-12-08 ENCOUNTER — Ambulatory Visit: Payer: Self-pay | Admitting: Neurology

## 2014-12-11 DIAGNOSIS — N201 Calculus of ureter: Secondary | ICD-10-CM | POA: Diagnosis not present

## 2014-12-15 ENCOUNTER — Ambulatory Visit (INDEPENDENT_AMBULATORY_CARE_PROVIDER_SITE_OTHER): Payer: Commercial Managed Care - HMO | Admitting: Family Medicine

## 2014-12-15 ENCOUNTER — Encounter: Payer: Self-pay | Admitting: Family Medicine

## 2014-12-15 ENCOUNTER — Telehealth: Payer: Self-pay | Admitting: Family Medicine

## 2014-12-15 VITALS — BP 128/76 | HR 64 | Temp 97.6°F | Wt 162.2 lb

## 2014-12-15 DIAGNOSIS — R197 Diarrhea, unspecified: Secondary | ICD-10-CM

## 2014-12-15 DIAGNOSIS — R630 Anorexia: Secondary | ICD-10-CM

## 2014-12-15 DIAGNOSIS — K529 Noninfective gastroenteritis and colitis, unspecified: Secondary | ICD-10-CM | POA: Diagnosis not present

## 2014-12-15 DIAGNOSIS — R109 Unspecified abdominal pain: Secondary | ICD-10-CM

## 2014-12-15 LAB — POCT URINALYSIS DIPSTICK
Bilirubin, UA: NEGATIVE
Glucose, UA: NEGATIVE
KETONES UA: NEGATIVE
Leukocytes, UA: NEGATIVE
NITRITE UA: NEGATIVE
Protein, UA: NEGATIVE
Spec Grav, UA: 1.015
UROBILINOGEN UA: 2
pH, UA: 6

## 2014-12-15 MED ORDER — DIPHENOXYLATE-ATROPINE 2.5-0.025 MG PO TABS: 1.0000 | ORAL_TABLET | Freq: Four times a day (QID) | ORAL | Status: DC | PRN

## 2014-12-15 NOTE — Telephone Encounter (Signed)
Pt has appt 12/15/14 at 2 pm with Dr Lorelei Pont.

## 2014-12-15 NOTE — Progress Notes (Signed)
Dr. Frederico Hamman T. Elie Leppo, MD, [facility] [place], [address] Phone: [phone] Fax: [phone]  12/15/2014  Patient: Dana [name], MRN: [id number], DOB: 08/14/1945, 69 y.o.  Primary Physician:  Eliezer Lofts, MD  Chief Complaint: Frequent BM's and Chest Pain  Subjective:   Dana [name] is a 69 y.o. very pleasant female patient who presents with the following:  Ever since she had her lithotripsy, and last Thursday went on a check-up - in the bladder now. Had a KUB had a spot.   Since last week, had a bowel movements 2-3 times a day. Feels sick, no appetite, does not feel well.  Lost about 10 pounds in a little bit less than a month. BM all started last week. Loose 2-3 x a day Some nausea No v  Has had some L sided flank pain, too.   Drinking some water, ginger ale, sprite.  Eating relatively blandly.  Took some stool softener.  Then Kaopectate.  18-23rd.  At least since last Monday, sometimes 1-2, sometimes 3-4 BM for 1 week.     Past Medical History, Surgical History, Social History, Family History, Problem List, Medications, and Allergies have been reviewed and updated if relevant.  Patient Active Problem List   Diagnosis Date Noted  . Routine general medical examination at a health care facility 04/29/2014  . Numbness and tingling in hands 04/29/2014  . Counseling regarding end of life decision making 04/29/2014  . Urge incontinence 04/29/2014  . Prediabetes 12/24/2013  . Elevated blood-pressure reading without diagnosis of hypertension 12/10/2013  . Mucous cyst of toe 12/10/2013  . Peroneus longus tendonitis 11/24/2013  . 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  . Hx of diverticulitis of colon 11/07/2012  . Allergic rhinitis 11/07/2012  . High cholesterol 11/07/2012  . Migraines 11/07/2012  . Unspecified hereditary and idiopathic peripheral neuropathy 11/07/2012  . LGSIL Pap smear of vaginal cuff  11/07/2012  . GERD (gastroesophageal reflux disease) 11/07/2012  . Family history of premature coronary heart disease 11/07/2012  . Verruca vulgaris 11/07/2012    Past Medical History  Diagnosis Date  . Anemia   . Arthritis   . Allergy   . Hyperlipidemia   . Migraine   . History of chicken pox   . Diverticulitis   . Kidney stone     Past Surgical History  Procedure Laterality Date  . Appendectomy  1970  . Cholecystectomy  1971  . Tubal ligation    . Abdominal hysterectomy  2006    cervix remains, menorrhagia  . Breast surgery  1995    biopsy-left  . Breast surgery  1995    right milk duct removed    History   Social History  . Marital Status: Married    Spouse Name: [name]  . Number of Children: 1  . Years of Education: 12   Occupational History  .      Retired   Social History Main Topics  . Smoking status: Never Smoker   . Smokeless tobacco: Never Used  . Alcohol Use: No  . Drug Use: No  . Sexual Activity: Not on file   Other Topics Concern  . Not on file   Social History Narrative       E xercise 3 times a week.      Patient is married and lives at home with her husband [name]). Patient is retired. Patient has high  school education.   Right handed.  Caffeine- None      HAs living will, HCPOA, full code ( reviewed 2014)    Family History  Problem Relation Age of Onset  . Heart disease Mother 58  . Hyperlipidemia Mother   . Hyperlipidemia Father   . Stroke Father   . Heart disease Father   . Fibromyalgia Sister   . Hyperlipidemia Sister   . Heart disease Brother   . Diabetes Brother     Allergies  Allergen Reactions  . Niacin And Related Itching  . Septra [Sulfamethoxazole-Trimethoprim] Itching    Medication list reviewed and updated in full in [place].  ROS: GEN: Acute illness details above GI: Tolerating PO intake GU: maintaining adequate hydration and urination Pulm: No SOB Interactive and getting along well at  home.  Otherwise, ROS is as per the HPI.   Objective:   BP 128/76 mmHg  Pulse 64  Temp(Src) 97.6 F (36.4 C) (Oral)  Wt 162 lb 4 oz (73.596 kg)  GEN: WDWN, NAD, Non-toxic, A & O x 3 HEENT: Atraumatic, Normocephalic. Neck supple. No masses, No LAD. Ears and Nose: No external deformity. CV: RRR, No M/G/R. No JVD. No thrill. No extra heart sounds. PULM: CTA B, no wheezes, crackles, rhonchi. No retractions. No resp. distress. No accessory muscle use. ABD: S, NT, ND, +BS. No rebound. No HSM. EXTR: No c/c/e NEURO Normal gait.  PSYCH: Normally interactive. Conversant. Not depressed or anxious appearing.  Calm demeanor.     Laboratory and Imaging Data: Results for orders placed or performed in visit on 12/15/14  POCT Urinalysis Dipstick  Result Value Ref Range   Color, UA Straw    Clarity, UA Clear    Glucose, UA Negative    Bilirubin, UA Negative    Ketones, UA Negative    Spec Grav, UA 1.015    Blood, UA Trace    pH, UA 6.0    Protein, UA Negative    Urobilinogen, UA 2.0    Nitrite, UA Negative    Leukocytes, UA Negative Negative     Assessment and Plan:   Gastroenteritis  Left flank pain - Plan: POCT Urinalysis Dipstick  Diarrhea  Anorexia  I do not see how this could relate to her lithotripsy. It seems as if the patient has caught case of gastroenteritis.  Continue with fluids and advance diet slowly.  Imodium p.r.n.  Refer to the patient instructions sections for details of plan shared with patient.   New Prescriptions   DIPHENOXYLATE-ATROPINE (LOMOTIL) 2.5-0.025 MG PER TABLET    Take 1 tablet by mouth 4 (four) times daily as needed for diarrhea or loose stools.   Orders Placed This Encounter  Procedures  . POCT Urinalysis Dipstick   Patient Instructions  Immodium A-D, 2 tabs after 1st BM, then do an additional tablet after each BM.   Try this 1st.   Prevent dehydration: drink Gatorade, Pedialyte, Ginger Ale, popsicles  Immodium A-D over ther counter  or Pepto-Bismol   day 1: clear liquids day 1: clear liquids--2-3 oz every 45-60 min. SMALL SIPS OR ICE CHIPS 7-up, ginger ale, sprite tea--no cofee chicken broth plain jello Water  Day 2: contnue clear liquids and add BRAT diet B--banana R--rice---can use chicken noodle or rice soup A--apple sauce T--dry toast GRITS, CREAM OF WHEAT, OATMEAL OK  Day 3: continue day 2 and add simple, non fat non spicy foods1 at a time to diet as canned peaches or pears backed or broiled chicken breast---or lunch meat boiled white  potato-cook in chicken broth Chicken and rice or chicken noodles      Signed,  Shareef Eddinger T. Larin Depaoli, MD   Patient's Medications  New Prescriptions   DIPHENOXYLATE-ATROPINE (LOMOTIL) 2.5-0.025 MG PER TABLET    Take 1 tablet by mouth 4 (four) times daily as needed for diarrhea or loose stools.  Previous Medications   CARBOXYMETHYLCELLULOSE SODIUM (THERATEARS) 0.25 % SOLN    Apply 1 drop to eye as needed.   CHOLECALCIFEROL (VITAMIN D) 1000 UNITS TABLET    Take 1,000 Units by mouth daily.   FLUTICASONE (FLONASE) 50 MCG/ACT NASAL SPRAY    Place 2 sprays into both nostrils daily.   FUROSEMIDE (LASIX) 20 MG TABLET    Take 1 tablet (20 mg total) by mouth as needed.   LACTASE (LACTAID) 3000 UNITS TABLET    Take 1 tablet by mouth as needed.    LOVASTATIN (MEVACOR) 40 MG TABLET    Take 1 tablet (40 mg total) by mouth at bedtime.   NYSTATIN CREAM (MYCOSTATIN)    Apply topically 2 (two) times daily. Continue for 48 hours after rash resolved.   OMEPRAZOLE (PRILOSEC) 20 MG CAPSULE    Take 1 capsule (20 mg total) by mouth daily.   ONDANSETRON (ZOFRAN ODT) 8 MG DISINTEGRATING TABLET    Take 1 tablet (8 mg total) by mouth every 8 (eight) hours as needed for nausea or vomiting.   OXYCODONE-ACETAMINOPHEN (PERCOCET/ROXICET) 5-325 MG PER TABLET    Take 1 tablet by mouth every 6 (six) hours as needed.   TAMSULOSIN (FLOMAX) 0.4 MG CAPS CAPSULE    Take 0.4 mg by mouth.   TOLTERODINE (DETROL  LA) 4 MG 24 HR CAPSULE    Take 4 mg by mouth daily.   TURMERIC CURCUMIN 500 MG CAPS    Take 1 tablet by mouth daily.  Modified Medications   No medications on file  Discontinued Medications   No medications on file

## 2014-12-15 NOTE — Telephone Encounter (Signed)
PLEASE NOTE: All timestamps contained within this report are represented as [place] Standard Time. CONFIDENTIALTY NOTICE: This fax transmission is intended only for the addressee. It contains information that is legally privileged, confidential or otherwise protected from use or disclosure. If you are not the intended recipient, you are strictly prohibited from reviewing, disclosing, copying using or disseminating any of this information or taking any action in reliance on or regarding this information. If you have received this fax in error, please notify us immediately by telephone so that we can arrange for its return to [place]. Phone: [phone], Toll-Free: [phone], Fax: [phone] Page: 1 of 1 Call Id: [id number] [place] Patient Name: Dana [name] DOB: [birth year] Initial Comment caller states she is having problems with her stomach - has abd pain and cannot eat much Nurse Assessment Nurse: Mechele Dawley, RN, Amy Date/Time Eilene Ghazi Time): 12/15/2014 9:16:29 AM Confirm and document reason for call. If symptomatic, describe symptoms. ---Vear Hassan [phone] - CALLER STATES THAT ON 7/14 SHE HAD LITHOTRIPSY. SHE WENT BACK AND HAD FOLLOW UP. REPEAT XRAY - SHOWED STONES IN THE BLADDER LEFT. SHE WAS HAVING PROBLEMS WITH HER BOWELS. SHE WOULD HAVE BMS 3-4 TIMES A DAY. NO APPETITE, NO SICKNESS. NO VOMITING. SHE WOULD JUST FEEL SICK. SHE IS JUST TRYING TO FIGURE OUT WHAT IS GOING ON. SHE WAS SUPPOSE TO WAIT A MONTH AND SEE IF SHE PASSES THE STONES. SHE IS NOT SURE IF THERE ARE ANY STONES IN THERE DUE TO THE XRAY BEING SO UNCLEAR. SHE WOULD HAVE BMS ONCE A DAY. SHE IS NOW CONCERNED ABOUT THE BMS BEING 2-3 A DAY THAT SOLID STOOLS JUST AN INCREASE. LAST NIGHT SHE WAS HURTING IN HER LEFT SIDE. SHE FELT IT WAS GAS BUILD UP. CONSTANT PAIN. NO FEVERS. Has the patient traveled out of the country within the last  30 days? ---Not Applicable Does the patient require triage? ---Yes Related visit to physician within the last 2 weeks? ---No Does the PT have any chronic conditions? (i.e. diabetes, asthma, etc.) ---Yes List chronic conditions. ---KIDNEY STONES, PRE-DIABETIC Guidelines Guideline Title Affirmed Question Affirmed Notes Diarrhea Abdominal pain (Exception: Pain clears with each passage of diarrhea stool) Final Disposition User See Physician within 4 Hours (or PCP triage) Mechele Dawley, RN, Amy Disagree/Comply: Comply

## 2014-12-15 NOTE — Progress Notes (Signed)
Pre visit review using our clinic review tool, if applicable. No additional management support is needed unless otherwise documented below in the visit note. 

## 2014-12-15 NOTE — Patient Instructions (Addendum)
Immodium A-D, 2 tabs after 1st BM, then do an additional tablet after each BM.   Try this 1st.   Prevent dehydration: drink Gatorade, Pedialyte, Ginger Ale, popsicles  Immodium A-D over ther counter or Pepto-Bismol   day 1: clear liquids day 1: clear liquids--2-3 oz every 45-60 min. SMALL SIPS OR ICE CHIPS 7-up, ginger ale, sprite tea--no cofee chicken broth plain jello Water  Day 2: contnue clear liquids and add BRAT diet B--banana R--rice---can use chicken noodle or rice soup A--apple sauce T--dry toast GRITS, CREAM OF WHEAT, OATMEAL OK  Day 3: continue day 2 and add simple, non fat non spicy foods1 at a time to diet as canned peaches or pears backed or broiled chicken breast---or lunch meat boiled white potato-cook in chicken broth Chicken and rice or chicken noodles

## 2015-01-08 ENCOUNTER — Other Ambulatory Visit: Payer: Self-pay

## 2015-01-08 DIAGNOSIS — Z1231 Encounter for screening mammogram for malignant neoplasm of breast: Secondary | ICD-10-CM

## 2015-01-14 ENCOUNTER — Ambulatory Visit (INDEPENDENT_AMBULATORY_CARE_PROVIDER_SITE_OTHER): Payer: Commercial Managed Care - HMO | Admitting: Neurology

## 2015-01-14 ENCOUNTER — Encounter: Payer: Self-pay | Admitting: Neurology

## 2015-01-14 VITALS — BP 137/70 | HR 76 | Ht 63.0 in | Wt 161.0 lb

## 2015-01-14 DIAGNOSIS — G43909 Migraine, unspecified, not intractable, without status migrainosus: Secondary | ICD-10-CM

## 2015-01-14 NOTE — Progress Notes (Signed)
Chief Complaint  Patient presents with  . Migraine    She is here for her yearly follow up appointment.  Feels her migraines are under good control.  Her last migraine was in July 2016 after having a lithotripsy.       [facility]  PATIENT: Dana [name] DOB: [birth year]   REASON FOR VISIT: follow up for migraine  HISTORY: Dana [name] is a 69 yo Caucasian Female, came in for migraine, referred by her primary care physician, Dr. Diona Browner.  She had a history of migraine since young, in her 51s, her typical migraine started with visual aura, shadow coming down to one side of her visual field, lasting 5-10 minutes, then followed by a severe pounding headaches, piercing pain, bending over would exacerbate her headaches, her headache usually lasts for one day.  She used to have headaches occasionally, this year she had couple typical migraine headaches, one in May 2014, headache happened after she took a shower, she has difficulty understanding the questions, was taken to urgent care, and ambulance was called, she was sent to emergency room, CAT scan, laboratory evaluation was normal,  The most recent one was July 27th, again proceeding by visual aura, shadowing comes down from her eyes, she has difficulty talking for about 24 hours, severe pounding headaches,  In between episode, she denies visual change, lateralized motor or sensory deficit.  She is taking Relafen as needed for headaches, which seems to work for her, she only has few headaches each year, she does not want preventive medications, She does not want other abortive treatment either.  UPDATE Sept 7th 2016: She had only had one migraine headache in July 2016 following lithotripsy procedure. Severe pounding headache last about 30 minutes, improved by resting, she was already taking hydrocodone following her surgical procedure  REVIEW OF SYSTEMS: Full 14 system review of systems performed and notable only for those listed,  all others are neg:  Light sensitivity, frequent urination, urgency, joint pain, achy muscles   ALLERGIES: Allergies  Allergen Reactions  . Niacin And Related Itching  . Septra [Sulfamethoxazole-Trimethoprim] Itching    HOME MEDICATIONS: Outpatient Prescriptions Prior to Visit  Medication Sig Dispense Refill  . Carboxymethylcellulose Sodium (THERATEARS) 0.25 % SOLN Apply 1 drop to eye as needed.    . cholecalciferol (VITAMIN D) 1000 UNITS tablet Take 1,000 Units by mouth daily.    . fluticasone (FLONASE) 50 MCG/ACT nasal spray Place 2 sprays into both nostrils daily. 48 g 3  . furosemide (LASIX) 20 MG tablet Take 1 tablet (20 mg total) by mouth as needed. 90 tablet 1  . lactase (LACTAID) 3000 UNITS tablet Take 1 tablet by mouth as needed.     . lovastatin (MEVACOR) 40 MG tablet Take 1 tablet (40 mg total) by mouth at bedtime. 90 tablet 3  . nystatin cream (MYCOSTATIN) Apply topically 2 (two) times daily. Continue for 48 hours after rash resolved. 30 g 0  . omeprazole (PRILOSEC) 20 MG capsule Take 1 capsule (20 mg total) by mouth daily. 90 capsule 3  . tamsulosin (FLOMAX) 0.4 MG CAPS capsule Take 0.4 mg by mouth.    . tolterodine (DETROL LA) 4 MG 24 hr capsule Take 4 mg by mouth daily.    . Turmeric Curcumin 500 MG CAPS Take 1 tablet by mouth daily.    . diphenoxylate-atropine (LOMOTIL) 2.5-0.025 MG per tablet Take 1 tablet by mouth 4 (four) times daily as needed for diarrhea or loose stools. 30 tablet 0  .  ondansetron (ZOFRAN ODT) 8 MG disintegrating tablet Take 1 tablet (8 mg total) by mouth every 8 (eight) hours as needed for nausea or vomiting. 20 tablet 0  . oxyCODONE-acetaminophen (PERCOCET/ROXICET) 5-325 MG per tablet Take 1 tablet by mouth every 6 (six) hours as needed. 20 tablet 0   No facility-administered medications prior to visit.    PAST MEDICAL HISTORY: Past Medical History  Diagnosis Date  . Anemia   . Arthritis   . Allergy   . Hyperlipidemia   . Migraine   .  History of chicken pox   . Diverticulitis   . Kidney stone     PAST SURGICAL HISTORY: Past Surgical History  Procedure Laterality Date  . Appendectomy  1970  . Cholecystectomy  1971  . Tubal ligation    . Abdominal hysterectomy  2006    cervix remains, menorrhagia  . Breast surgery  1995    biopsy-left  . Breast surgery  1995    right milk duct removed  . Lithotripsy      FAMILY HISTORY: Family History  Problem Relation Age of Onset  . Heart disease Mother 17  . Hyperlipidemia Mother   . Hyperlipidemia Father   . Stroke Father   . Heart disease Father   . Fibromyalgia Sister   . Hyperlipidemia Sister   . Heart disease Brother   . Diabetes Brother     SOCIAL HISTORY: Social History   Social History  . Marital Status: Married    Spouse Name: [name]  . Number of Children: 1  . Years of Education: 12   Occupational History  .      Retired   Social History Main Topics  . Smoking status: Never Smoker   . Smokeless tobacco: Never Used  . Alcohol Use: No  . Drug Use: No  . Sexual Activity: Not on file   Other Topics Concern  . Not on file   Social History Narrative       E xercise 3 times a week.      Patient is married and lives at home with her husband [name]). Patient is retired. Patient has high  school education.   Right handed.   Caffeine- None      HAs living will, HCPOA, full code ( reviewed 2014)     PHYSICAL EXAM  Filed Vitals:   01/14/15 1122  BP: 137/70  Pulse: 76  Height: 5\' 3"  (1.6 m)  Weight: 161 lb (73.029 kg)   Body mass index is 28.53 kg/(m^2). Generalized: In no acute distress  Neck: Supple, no carotid bruits , bil cerumen Musculoskeletal: No deformity  Neurological examination  Mentation: Alert oriented to time, place, history taking, and causual conversation  Cranial nerve II-XII: Pupils were equal round reactive to light extraocular movements were full, visual field were full on confrontational test. facial sensation and  strength were normal. hearing was intact to finger rubbing bilaterally. Uvula tongue midline. head turning and shoulder shrug and were normal and symmetric.Tongue protrusion into cheek strength was normal.  Motor: normal tone, bulk and strength. No focal weakness Coordination: Normal finger to nose, heel-to-shin bilaterally there was no truncal ataxia  Gait: Rising up from seated position without assistance, normal stance,  moderate stride, good arm swing, smooth turning, able to perform tiptoe, and heel walking without difficulty.  Romberg signs: Negative  Deep tendon reflexes: Brachioradialis 2/2, biceps 2/2, triceps 2/2, patellar 2/2, Achilles 2/2, plantar responses were flexor bilaterally.    DIAGNOSTIC DATA (LABS, IMAGING, TESTING) -  I reviewed patient records, labs, notes, testing and imaging myself where available.      Component Value Date/Time   NA 143 11/13/2014 1029   K 4.1 11/13/2014 1029   CL 107 11/13/2014 1029   CO2 27 11/13/2014 1029   GLUCOSE 120* 11/13/2014 1029   BUN 10 11/13/2014 1029   CREATININE 0.78 11/13/2014 1029   CALCIUM 9.4 11/13/2014 1029   PROT 7.3 11/13/2014 1029   ALBUMIN 3.7 11/13/2014 1029   AST 24 11/13/2014 1029   ALT 32 11/13/2014 1029   ALKPHOS 82 11/13/2014 1029   BILITOT 0.7 11/13/2014 1029   GFRNONAA >60 11/13/2014 1029   GFRAA >60 11/13/2014 1029   Lab Results  Component Value Date   CHOL 198 04/22/2014   HDL 44.20 04/22/2014   LDLCALC 126* 04/22/2014   TRIG 140.0 04/22/2014   CHOLHDL 4 04/22/2014    ASSESSMENT AND PLAN  69 y.o. year old female   Migraine with visual aura  Only happens occasionally,   When necessary NSAIDs  Return to clinic for new issues   Marcial Pacas, M.D. Ph.D.  [facility] [place], [place] [address] Phone: [phone] Fax:      [phone]

## 2015-01-16 DIAGNOSIS — R3915 Urgency of urination: Secondary | ICD-10-CM | POA: Diagnosis not present

## 2015-01-16 DIAGNOSIS — N201 Calculus of ureter: Secondary | ICD-10-CM | POA: Diagnosis not present

## 2015-02-16 ENCOUNTER — Ambulatory Visit: Payer: Commercial Managed Care - HMO

## 2015-02-18 DIAGNOSIS — H35033 Hypertensive retinopathy, bilateral: Secondary | ICD-10-CM | POA: Diagnosis not present

## 2015-02-18 DIAGNOSIS — H04123 Dry eye syndrome of bilateral lacrimal glands: Secondary | ICD-10-CM | POA: Diagnosis not present

## 2015-02-18 DIAGNOSIS — H2513 Age-related nuclear cataract, bilateral: Secondary | ICD-10-CM | POA: Diagnosis not present

## 2015-02-18 DIAGNOSIS — H524 Presbyopia: Secondary | ICD-10-CM | POA: Diagnosis not present

## 2015-02-18 DIAGNOSIS — E119 Type 2 diabetes mellitus without complications: Secondary | ICD-10-CM | POA: Diagnosis not present

## 2015-02-18 LAB — HM DIABETES EYE EXAM

## 2015-02-25 ENCOUNTER — Encounter: Payer: Self-pay | Admitting: Family Medicine

## 2015-03-11 ENCOUNTER — Ambulatory Visit: Payer: Commercial Managed Care - HMO

## 2015-04-10 ENCOUNTER — Encounter: Payer: Self-pay | Admitting: Family Medicine

## 2015-04-10 ENCOUNTER — Ambulatory Visit (INDEPENDENT_AMBULATORY_CARE_PROVIDER_SITE_OTHER): Payer: Commercial Managed Care - HMO | Admitting: Family Medicine

## 2015-04-10 DIAGNOSIS — M7581 Other shoulder lesions, right shoulder: Secondary | ICD-10-CM

## 2015-04-10 DIAGNOSIS — S46811A Strain of other muscles, fascia and tendons at shoulder and upper arm level, right arm, initial encounter: Secondary | ICD-10-CM | POA: Diagnosis not present

## 2015-04-10 MED ORDER — CYCLOBENZAPRINE HCL 10 MG PO TABS: 10.0000 mg | ORAL_TABLET | Freq: Every evening | ORAL | Status: DC | PRN

## 2015-04-10 MED ORDER — DICLOFENAC SODIUM 75 MG PO TBEC: 75.0000 mg | DELAYED_RELEASE_TABLET | Freq: Two times a day (BID) | ORAL | Status: DC
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
## 2015-04-10 NOTE — Assessment & Plan Note (Signed)
Heat , massage, muscle relaxant and stretching.

## 2015-04-10 NOTE — Patient Instructions (Signed)
Heat, massage, Start home exercises per info given. Diclofenac twice daily for pain and inflammation.  Muscle relaxant at night as needed for spasm in neck.  Call if not improving in 2 weeks for X-ray and possible injection.

## 2015-04-10 NOTE — Progress Notes (Signed)
Pre visit review using our clinic review tool, if applicable. No additional management support is needed unless otherwise documented below in the visit note. 

## 2015-04-10 NOTE — Assessment & Plan Note (Signed)
No indication for X-ray at this time. NSAIDs, home PT info given, counseled to move to avoid frozen shoulder.

## 2015-04-13 ENCOUNTER — Telehealth: Payer: Self-pay

## 2015-04-13 NOTE — Telephone Encounter (Signed)
Pt left v/m; pt thinks antiinflammatory med is causing pt to have nausea and diarrhea. Pt request different med to [facility]. Pt request cb.

## 2015-04-14 MED ORDER — MELOXICAM 7.5 MG PO TABS: 7.5000 mg | ORAL_TABLET | Freq: Every day | ORAL | Status: DC

## 2015-04-14 NOTE — Telephone Encounter (Signed)
Mrs. Shimizu notified as instructed by telephone.  She states she now thick the muscle relaxant is what was causing the nausea and diarrhea.  She states she took just the diclofenac yesterday and did fine with it.  Advised she can continue with the diclofenac if tolerating.  Advised to make sure she takes it with food.  If she continues have nausea and diarrhea, then stop the diclofenac and pick up the Meloxicam prescription.  Patient states understanding.

## 2015-04-14 NOTE — Telephone Encounter (Signed)
Stop diclofenac. Can try meloxicam but if causing stomach upset let me know as well.

## 2015-04-23 ENCOUNTER — Ambulatory Visit
Admission: RE | Admit: 2015-04-23 | Discharge: 2015-04-23 | Disposition: A | Discharge status: Home / Self Care | Payer: Commercial Managed Care - HMO | Source: Ambulatory Visit

## 2015-04-23 DIAGNOSIS — Z1231 Encounter for screening mammogram for malignant neoplasm of breast: Secondary | ICD-10-CM

## 2015-04-24 ENCOUNTER — Other Ambulatory Visit (INDEPENDENT_AMBULATORY_CARE_PROVIDER_SITE_OTHER): Payer: Commercial Managed Care - HMO

## 2015-04-24 ENCOUNTER — Ambulatory Visit: Payer: Commercial Managed Care - HMO | Admitting: Family Medicine

## 2015-04-24 ENCOUNTER — Other Ambulatory Visit: Payer: Commercial Managed Care - HMO

## 2015-04-24 ENCOUNTER — Telehealth: Payer: Self-pay | Admitting: Family Medicine

## 2015-04-24 DIAGNOSIS — R7303 Prediabetes: Secondary | ICD-10-CM

## 2015-04-24 DIAGNOSIS — E7800 Pure hypercholesterolemia, unspecified: Secondary | ICD-10-CM

## 2015-04-24 LAB — LIPID PANEL
CHOL/HDL RATIO: 4
Cholesterol: 195 mg/dL (ref 0–200)
HDL: 45.7 mg/dL (ref >39.00)
LDL Cholesterol: 123 mg/dL — ABNORMAL HIGH (ref 0–99)
NONHDL: 149.23
Triglycerides: 133 mg/dL (ref 0.0–149.0)
VLDL: 26.6 mg/dL (ref 0.0–40.0)

## 2015-04-24 LAB — COMPREHENSIVE METABOLIC PANEL
ALBUMIN: 4.1 g/dL (ref 3.5–5.2)
ALK PHOS: 79 U/L (ref 39–117)
ALT: 20 U/L (ref 0–35)
AST: 15 U/L (ref 0–37)
BILIRUBIN TOTAL: 0.5 mg/dL (ref 0.2–1.2)
BUN: 15 mg/dL (ref 6–23)
CO2: 32 mEq/L (ref 19–32)
Calcium: 9.7 mg/dL (ref 8.4–10.5)
Chloride: 104 mEq/L (ref 96–112)
Creatinine, Ser: 0.78 mg/dL (ref 0.40–1.20)
GFR: 78.01 mL/min (ref >60.00)
GLUCOSE: 98 mg/dL (ref 70–99)
Potassium: 4.5 mEq/L (ref 3.5–5.1)
SODIUM: 144 meq/L (ref 135–145)
TOTAL PROTEIN: 7 g/dL (ref 6.0–8.3)

## 2015-04-24 LAB — HEMOGLOBIN A1C: HEMOGLOBIN A1C: 6.2 % (ref 4.6–6.5)

## 2015-04-24 NOTE — Telephone Encounter (Signed)
-----   Message from Marchia Bond sent at 04/24/2015 11:29 AM EST ----- Regarding: Pt had cpx labs today, need orders ASAP! Thanks Need orders ASAP for cpx labs! Thanks Aniceto Boss

## 2015-04-29 ENCOUNTER — Other Ambulatory Visit: Payer: Self-pay | Admitting: Family Medicine

## 2015-05-01 ENCOUNTER — Encounter: Payer: Self-pay | Admitting: Family Medicine

## 2015-05-01 ENCOUNTER — Ambulatory Visit (INDEPENDENT_AMBULATORY_CARE_PROVIDER_SITE_OTHER): Payer: Commercial Managed Care - HMO | Admitting: Family Medicine

## 2015-05-01 ENCOUNTER — Other Ambulatory Visit: Payer: Self-pay | Admitting: Family Medicine

## 2015-05-01 VITALS — BP 116/98 | HR 65 | Temp 97.5°F | Ht 63.0 in | Wt 161.0 lb

## 2015-05-01 DIAGNOSIS — E7800 Pure hypercholesterolemia, unspecified: Secondary | ICD-10-CM

## 2015-05-01 DIAGNOSIS — R7303 Prediabetes: Secondary | ICD-10-CM

## 2015-05-01 DIAGNOSIS — Z7189 Other specified counseling: Secondary | ICD-10-CM

## 2015-05-01 DIAGNOSIS — Z1159 Encounter for screening for other viral diseases: Secondary | ICD-10-CM

## 2015-05-01 DIAGNOSIS — Z0000 Encounter for general adult medical examination without abnormal findings: Secondary | ICD-10-CM | POA: Diagnosis not present

## 2015-05-01 NOTE — Assessment & Plan Note (Signed)
Stable control. Encouraged exercise, weight loss, healthy eating habits.  

## 2015-05-01 NOTE — Progress Notes (Signed)
Pre visit review using our clinic review tool, if applicable. No additional management support is needed unless otherwise documented below in the visit note. 

## 2015-05-01 NOTE — Progress Notes (Signed)
I have personally reviewed the Medicare Annual Wellness questionnaire and have noted 1. The patient's medical and social history 2. Their use of alcohol, tobacco or illicit drugs 3. Their current medications and supplements 4. The patient's functional ability including ADL's, fall risks, home safety risks and hearing or visual             impairment. 5. Diet and physical activities 6. Evidence for depression or mood disorders 7.         Updated provider list Cognitive evaluation was performed and recorded on pt medicare questionnaire form. The patients weight, height, BMI and visual acuity have been recorded in the chart  I have made referrals, counseling and provided education to the patient based review of the above and I have provided the pt with a written personalized care plan for preventive services.   Followed by GYN for past abn pap.  At appt on 12/2 2016: treated for strain of right trapezius muscle, right rotator cuff tendonitis. Treated with home PT,heat, massage,  Diclofenac and cyclobenzaprine.4 She has had improvement of pain  in right shoulder. Intolerant of muscle relaxant.  Doing home exercises.  Elevated Cholesterol:LDL at goal < 130 on mevacor  1 tab daily. Lab Results  Component Value Date   CHOL 195 04/24/2015   HDL 45.70 04/24/2015   LDLCALC 123* 04/24/2015   TRIG 133.0 04/24/2015   CHOLHDL 4 04/24/2015   Using medications without  problems:None Muscle aches: None Diet compliance: Good Exercise: walking 2 times a day with dog Other complaints:  BP Readings from Last 3 Encounters:  05/01/15 116/98  04/10/15 110/62  01/14/15 137/70    Prediabetes, improved with low carb diet. Lab Results  Component Value Date   HGBA1C 6.2 04/24/2015    Wt Readings from Last 3 Encounters:  04/10/15 160 lb 8 oz (72.802 kg)  01/14/15 161 lb (73.029 kg)  12/15/14 162 lb 4 oz (73.596 kg)  Body mass index is 28.53 kg/(m^2).  Migraine followed by Dr. Krista Blue  neurology.    Reports that she has never smoked. She has never used smokeless tobacco. She reports that she does not drink alcohol or use illicit drugs.  Social History /Family History/Past Medical History reviewed and updated if needed.  Review of Systems  Constitutional: Negative for fever and fatigue.  HENT: Negative for ear pain.  Eyes: Negative for pain.  Respiratory: Negative for chest tightness and shortness of breath.  Cardiovascular: neg ative peripheral edema Negative for chest pain and palpitations.  Gastrointestinal: Negative for abdominal pain.  Genitourinary: Negative for dysuria and vaginal bleeding.  Neurological: Negative for syncope.  Psychiatric/Behavioral: Negative for behavioral problems.      Objective:  Physical Exam  Constitutional: Vital signs are normal. She appears well-developed and well-nourished. She is cooperative. Non-toxic appearance. She does not appear ill. No distress.  HENT:  Head: Normocephalic.  Right Ear: Hearing, tympanic membrane, external ear and ear canal normal.  Left Ear: Hearing, tympanic membrane, external ear and ear canal normal.  Nose: Nose normal.  Eyes: Conjunctivae, EOM and lids are normal. Pupils are equal, round, and reactive to light. Lids are everted and swept, no foreign bodies found.  Neck: Trachea normal and normal range of motion. Neck supple. Carotid bruit is not present. No mass and no thyromegaly present.  Cardiovascular: Normal rate, regular rhythm, S1 normal, S2 normal, normal heart sounds and intact distal pulses. Exam reveals no gallop.  No murmur heard. Pulmonary/Chest: Effort normal and breath sounds normal. No respiratory  distress. She has no wheezes. She has no rhonchi. She has no rales.  Abdominal: Soft. Normal appearance and bowel sounds are normal. She exhibits no distension, no fluid wave, no abdominal bruit and no mass. There is no hepatosplenomegaly. There is no tenderness.  There is no rebound, no guarding and no CVA tenderness. No hernia.  Genitourinary:  PER GYN  Lymphadenopathy:   She has no cervical adenopathy.   She has no axillary adenopathy.  Neurological: She is alert. She has normal strength. No cranial nerve deficit or sensory deficit.  Skin: Skin is warm, dry and intact. No rash noted.  Psychiatric: Her speech is normal and behavior is normal. Judgment normal. Her mood appears not anxious. Cognition and memory are normal. She does not exhibit a depressed mood.  Neg spurling Bilaterally.       Assessment & Plan:  The patient's preventative maintenance and recommended screening tests for an annual wellness exam were reviewed in full today. Brought up to date unless services declined.  Counselled on the importance of diet, exercise, and its role in overall health and mortality. The patient's FH and SH was reviewed, including their home life, tobacco status, and drug and alcohol status.    Vaccines shingles, PNA 13 and 23, and [facility] Due for tdap. Colon: 2008 nml, repeat in 10 years. Mammo:04/2015, nml. DVE/PAP: Hx of abnormal Pap LGSIL 2011, then abnormal pap of vaginal cuff ASCUS 2012 s/p TAH (TAH in 2006 performed for abnormal pap), but nml 2013 nml pap of vaginal cuff.. Followed by GYN. DEXA: 04/2014 nml repeat in 5 years. Nonsmoker  Hep C: due

## 2015-05-01 NOTE — Assessment & Plan Note (Signed)
Well controlled. Continue current medication.  

## 2015-05-01 NOTE — Assessment & Plan Note (Signed)
HCPOA is her husband, has living will, She is full code.  (reviewed 2016)

## 2015-05-01 NOTE — Patient Instructions (Addendum)
Keep working on healthy eating , regular exercise and weight loss. Call insurance to check on coverage of tetanus or TDAP vaccine.  Stop at lab on way for hep C test. Make follow up appt if back pain and neck pain continuing.

## 2015-05-02 LAB — HEPATITIS C ANTIBODY: HCV AB: NEGATIVE

## 2015-05-05 ENCOUNTER — Ambulatory Visit (INDEPENDENT_AMBULATORY_CARE_PROVIDER_SITE_OTHER): Payer: Commercial Managed Care - HMO | Admitting: Family Medicine

## 2015-05-05 ENCOUNTER — Encounter: Payer: Self-pay | Admitting: *Deleted

## 2015-05-05 ENCOUNTER — Encounter: Payer: Self-pay | Admitting: Family Medicine

## 2015-05-05 VITALS — BP 126/76 | HR 80 | Temp 97.6°F | Wt 158.0 lb

## 2015-05-05 DIAGNOSIS — R0789 Other chest pain: Secondary | ICD-10-CM

## 2015-05-05 DIAGNOSIS — R0781 Pleurodynia: Secondary | ICD-10-CM

## 2015-05-05 MED ORDER — DICLOFENAC SODIUM 75 MG PO TBEC: 75.0000 mg | DELAYED_RELEASE_TABLET | Freq: Two times a day (BID) | ORAL | Status: DC

## 2015-05-05 MED ORDER — TRAMADOL HCL 50 MG PO TABS: 50.0000 mg | ORAL_TABLET | Freq: Two times a day (BID) | ORAL | Status: DC | PRN

## 2015-05-05 NOTE — Patient Instructions (Signed)
I do think you have ribcage strain. Treat with diclofenac (refilled) and tramadol for breakthrough pain at night. Ice or heating pad to back (whichever soothes better). Do gentle stretching of back muscles.  Avoid heavy lifting, rest back. Let us know if not improving with treatment and rest.

## 2015-05-05 NOTE — Assessment & Plan Note (Addendum)
Anticipate either rhomboid or ribcage strain after recent heavy lifting with move to new townhouse. No red flags, no skin rash to point to shingles (h/o this x2). Will treat with continued diclofenac and tramadol QHS PRN (which pt has found helpful). Discussed gentle stretching, ice/heating pad. Update if not improving with treatment.

## 2015-05-05 NOTE — Progress Notes (Signed)
BP 126/76 mmHg  Pulse 80  Temp(Src) 97.6 F (36.4 C) (Oral)  Wt 158 lb (71.668 kg)   CC: back pain  Subjective:    Patient ID: Dana [name], female    DOB: 1946-02-25, 69 y.o.   MRN: [id number]  HPI: Dana [name] is a 69 y.o. female presenting on 05/05/2015 for Back Pain   Pt of Dr Rometta Emery (husband of my patient [name]) presents today with R thoracic back pain ongoing over the last week. Sore to touch. Some radiation to RUQ. Denies inciting trauma/injury but she has been moving recently (heavy lifting) - planned annual RV trip to [facility]. No skin rash. No paresthesias of skin. Worse pain at night time.   Intermittently takes tylenol, voltaren tablet. Taking salon pas and OTC arthritis creams. Rare tramadol use (husband's) which helped.   Recently dx with RTC tendonitis and R trap strain by PCP - treating with diclofenac and flexeril.   Relevant past medical, surgical, family and social history reviewed and updated as indicated. Interim medical history since our last visit reviewed. Allergies and medications reviewed and updated. Current Outpatient Prescriptions on File Prior to Visit  Medication Sig  . acetaminophen (TYLENOL) 325 MG tablet Take 650 mg by mouth as needed.  Marland Kitchen aspirin 81 MG tablet Take 81 mg by mouth daily.  . Carboxymethylcellulose Sodium (THERATEARS) 0.25 % SOLN Apply 1 drop to eye as needed.  . cholecalciferol (VITAMIN D) 1000 UNITS tablet Take 1,000 Units by mouth daily.  . Cyanocobalamin (VITAMIN B-12 PO) Take by mouth.  . fluticasone (FLONASE) 50 MCG/ACT nasal spray Place 2 sprays into both nostrils daily.  . furosemide (LASIX) 20 MG tablet Take 1 tablet (20 mg total) by mouth as needed.  . lactase (LACTAID) 3000 UNITS tablet Take 1 tablet by mouth as needed.   . lovastatin (MEVACOR) 40 MG tablet TAKE 1 TABLET AT BEDTIME  . Multiple Vitamin (MULTIVITAMIN) tablet Take 1 tablet by mouth daily.  Marland Kitchen nystatin cream (MYCOSTATIN) Apply topically 2 (two) times daily.  Continue for 48 hours after rash resolved.  Marland Kitchen omeprazole (PRILOSEC) 20 MG capsule TAKE 1 CAPSULE EVERY DAY  . tolterodine (DETROL LA) 4 MG 24 hr capsule TAKE 1 CAPSULE EVERY DAY  (STOP  TOVIAZ  ER)  . Turmeric Curcumin 500 MG CAPS Take 1 tablet by mouth daily.  Marland Kitchen XIIDRA 5 % SOLN PLACE 1 DROP TWICE A DAY INTO BOTH EYES   No current facility-administered medications on file prior to visit.    Review of Systems Per HPI unless specifically indicated in ROS section     Objective:    BP 126/76 mmHg  Pulse 80  Temp(Src) 97.6 F (36.4 C) (Oral)  Wt 158 lb (71.668 kg)  Wt Readings from Last 3 Encounters:  05/05/15 158 lb (71.668 kg)  05/01/15 161 lb (73.029 kg)  04/10/15 160 lb 8 oz (72.802 kg)    Physical Exam  Constitutional: She appears well-developed and well-nourished. No distress.  Abdominal: Soft. Normal appearance. There is no tenderness. There is no CVA tenderness.  Musculoskeletal: She exhibits no edema.  No pain midline spine No cervical pain No paraspinous mm tenderness No shoulder pain Tender to palpation R lateral and posterior inferior ribcage  Skin: Skin is warm and dry. No rash noted. No erythema.  No vesicular rash  Psychiatric: She has a normal mood and affect.  Nursing note and vitals reviewed.     Assessment & Plan:   Problem List Items Addressed This Visit  Rib pain on right side - Primary    Anticipate either rhomboid or ribcage strain after recent heavy lifting with move to new townhouse. No red flags, no skin rash to point to shingles (h/o this x2). Will treat with continued diclofenac and tramadol QHS PRN (which pt has found helpful). Discussed gentle stretching, ice/heating pad. Update if not improving with treatment.          Follow up plan: Return if symptoms worsen or fail to improve.

## 2015-05-05 NOTE — Progress Notes (Signed)
Pre visit review using our clinic review tool, if applicable. No additional management support is needed unless otherwise documented below in the visit note. 

## 2015-05-12 ENCOUNTER — Ambulatory Visit: Payer: Commercial Managed Care - HMO | Admitting: Family Medicine

## 2015-05-21 ENCOUNTER — Ambulatory Visit: Payer: Commercial Managed Care - HMO | Admitting: Family Medicine

## 2015-05-25 ENCOUNTER — Other Ambulatory Visit: Payer: Self-pay | Admitting: Family Medicine

## 2015-07-24 ENCOUNTER — Telehealth: Payer: Self-pay | Admitting: Family Medicine

## 2015-07-24 DIAGNOSIS — K219 Gastro-esophageal reflux disease without esophagitis: Secondary | ICD-10-CM | POA: Diagnosis not present

## 2015-07-24 DIAGNOSIS — S80862A Insect bite (nonvenomous), left lower leg, initial encounter: Secondary | ICD-10-CM | POA: Diagnosis not present

## 2015-07-24 DIAGNOSIS — E7800 Pure hypercholesterolemia, unspecified: Secondary | ICD-10-CM | POA: Diagnosis not present

## 2015-07-24 DIAGNOSIS — N3281 Overactive bladder: Secondary | ICD-10-CM | POA: Diagnosis not present

## 2015-07-24 NOTE — Telephone Encounter (Signed)
Patient Name: Dana [name] DOB: 1945/12/20 Initial Comment Caller states that she was bitten by a tick 2 weeks ago on her left leg and has a red spot that has grown bigger. Nurse Assessment Nurse: Ronnald Ramp, RN, Miranda Date/Time (Eastern Time): 07/24/2015 10:42:52 AM Confirm and document reason for call. If symptomatic, describe symptoms. You must click the next button to save text entered. ---Caller states was bitten by a tick on her left leg 2 weeks ago and there is redness at the site that is getting bigger. Denies fever Has the patient traveled out of the country within the last 30 days? ---Not Applicable Does the patient have any new or worsening symptoms? ---Yes Will a triage be completed? ---Yes Related visit to physician within the last 2 weeks? ---No Does the PT have any chronic conditions? (i.e. diabetes, asthma, etc.) ---Yes List chronic conditions. ---High Cholesterol Is this a behavioral health or substance abuse call? ---No Guidelines Guideline Title Affirmed Question Affirmed Notes Tick Bite [1] Red or very tender (to touch) area AND [2] started over 24 hours after the bite Final Disposition User See Physician within 24 Hours Jones, RN, [employer] is out of state and will have to be seen there. Told her to check with her insurance company for information on being seen. Referrals GO TO FACILITY OTHER - SPECIFY Disagree/Comply: Comply

## 2015-07-24 NOTE — Telephone Encounter (Signed)
Agree pt needs to be seen 

## 2015-07-24 NOTE — Telephone Encounter (Signed)
She is in [place]. She went to the ER. She was given an antibiotic. Will make an OV when she returns, if needed

## 2015-08-17 ENCOUNTER — Emergency Department: Payer: Commercial Managed Care - HMO

## 2015-08-17 ENCOUNTER — Telehealth: Payer: Self-pay | Admitting: Family Medicine

## 2015-08-17 ENCOUNTER — Encounter: Payer: Self-pay | Admitting: Emergency Medicine

## 2015-08-17 ENCOUNTER — Emergency Department
Admission: EM | Admit: 2015-08-17 | Discharge: 2015-08-17 | Disposition: A | Discharge status: Home / Self Care | Payer: Commercial Managed Care - HMO | Attending: Emergency Medicine | Admitting: Emergency Medicine

## 2015-08-17 DIAGNOSIS — Z7982 Long term (current) use of aspirin: Secondary | ICD-10-CM | POA: Diagnosis not present

## 2015-08-17 DIAGNOSIS — E785 Hyperlipidemia, unspecified: Secondary | ICD-10-CM | POA: Insufficient documentation

## 2015-08-17 DIAGNOSIS — K219 Gastro-esophageal reflux disease without esophagitis: Secondary | ICD-10-CM | POA: Insufficient documentation

## 2015-08-17 DIAGNOSIS — M1990 Unspecified osteoarthritis, unspecified site: Secondary | ICD-10-CM | POA: Diagnosis not present

## 2015-08-17 DIAGNOSIS — M542 Cervicalgia: Secondary | ICD-10-CM | POA: Diagnosis present

## 2015-08-17 DIAGNOSIS — Y939 Activity, unspecified: Secondary | ICD-10-CM | POA: Insufficient documentation

## 2015-08-17 DIAGNOSIS — S134XXA Sprain of ligaments of cervical spine, initial encounter: Secondary | ICD-10-CM | POA: Insufficient documentation

## 2015-08-17 DIAGNOSIS — G43909 Migraine, unspecified, not intractable, without status migrainosus: Secondary | ICD-10-CM | POA: Insufficient documentation

## 2015-08-17 DIAGNOSIS — Y999 Unspecified external cause status: Secondary | ICD-10-CM | POA: Diagnosis not present

## 2015-08-17 DIAGNOSIS — S138XXA Sprain of joints and ligaments of other parts of neck, initial encounter: Secondary | ICD-10-CM | POA: Diagnosis not present

## 2015-08-17 DIAGNOSIS — Y92410 Unspecified street and highway as the place of occurrence of the external cause: Secondary | ICD-10-CM | POA: Diagnosis not present

## 2015-08-17 DIAGNOSIS — S139XXA Sprain of joints and ligaments of unspecified parts of neck, initial encounter: Secondary | ICD-10-CM

## 2015-08-17 DIAGNOSIS — S199XXA Unspecified injury of neck, initial encounter: Secondary | ICD-10-CM | POA: Diagnosis not present
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
## 2015-08-17 NOTE — Telephone Encounter (Signed)
Ok by me if ok by pcp. Reasonable patients.

## 2015-08-17 NOTE — ED Notes (Signed)
Patient transported to X-ray 

## 2015-08-17 NOTE — Telephone Encounter (Signed)
Patient is asking to switch from [facility] to [facility].  Patient said her husband, [name], sees Dr.Gutierrez and she'd like to see the same doctor.  Can patient switch?
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
## 2015-08-18 NOTE — Telephone Encounter (Signed)
I spoke with patient and let her know she can switch to Dr.Gutierrez.   She scheduled appointment with Dr.Gutierrez for tomorrow.

## 2015-08-18 NOTE — Telephone Encounter (Signed)
No problem, no concerns

## 2015-08-19 ENCOUNTER — Ambulatory Visit (INDEPENDENT_AMBULATORY_CARE_PROVIDER_SITE_OTHER): Payer: Commercial Managed Care - HMO | Admitting: Family Medicine

## 2015-08-19 ENCOUNTER — Encounter: Payer: Self-pay | Admitting: Family Medicine

## 2015-08-19 VITALS — BP 122/76 | HR 72 | Temp 97.6°F | Wt 163.5 lb

## 2015-08-19 DIAGNOSIS — W57XXXD Bitten or stung by nonvenomous insect and other nonvenomous arthropods, subsequent encounter: Secondary | ICD-10-CM | POA: Diagnosis not present

## 2015-08-19 DIAGNOSIS — S80862A Insect bite (nonvenomous), left lower leg, initial encounter: Secondary | ICD-10-CM | POA: Insufficient documentation

## 2015-08-19 DIAGNOSIS — S80862D Insect bite (nonvenomous), left lower leg, subsequent encounter: Secondary | ICD-10-CM | POA: Diagnosis not present

## 2015-08-19 DIAGNOSIS — W57XXXA Bitten or stung by nonvenomous insect and other nonvenomous arthropods, initial encounter: Secondary | ICD-10-CM
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
## 2015-08-19 NOTE — Patient Instructions (Signed)
Tick bite looking ok today. Doubt any further concerns for tick borne illness.  Tick Bite Information Ticks are insects that attach themselves to the skin and draw blood for food. There are various types of ticks. Common types include wood ticks and deer ticks. Most ticks live in shrubs and grassy areas. Ticks can climb onto your body when you make contact with leaves or grass where the tick is waiting. The most common places on the body for ticks to attach themselves are the scalp, neck, armpits, waist, and groin. Most tick bites are harmless, but sometimes ticks carry germs that cause diseases. These germs can be spread to a person during the tick's feeding process. The chance of a disease spreading through a tick bite depends on:   The type of tick.  Time of year.   How long the tick is attached.   Geographic location.  HOW CAN YOU PREVENT TICK BITES? Take these steps to help prevent tick bites when you are outdoors:  Wear protective clothing. Long sleeves and long pants are best.   Wear white clothes so you can see ticks more easily.  Tuck your pant legs into your socks.   If walking on a trail, stay in the middle of the trail to avoid brushing against bushes.  Avoid walking through areas with long grass.  Put insect repellent on all exposed skin and along boot tops, pant legs, and sleeve cuffs.   Check clothing, hair, and skin repeatedly and before going inside.   Brush off any ticks that are not attached.  Take a shower or bath as soon as possible after being outdoors.  WHAT IS THE PROPER WAY TO REMOVE A TICK? Ticks should be removed as soon as possible to help prevent diseases caused by tick bites. 1. If latex gloves are available, put them on before trying to remove a tick.  2. Using fine-point tweezers, grasp the tick as close to the skin as possible. You may also use curved forceps or a tick removal tool. Grasp the tick as close to its head as possible. Avoid  grasping the tick on its body. 3. Pull gently with steady upward pressure until the tick lets go. Do not twist the tick or jerk it suddenly. This may break off the tick's head or mouth parts. 4. Do not squeeze or crush the tick's body. This could force disease-carrying fluids from the tick into your body.  5. After the tick is removed, wash the bite area and your hands with soap and water or other disinfectant such as alcohol. 6. Apply a small amount of antiseptic cream or ointment to the bite site.  7. Wash and disinfect any instruments that were used.  Do not try to remove a tick by applying a hot match, petroleum jelly, or fingernail polish to the tick. These methods do not work and may increase the chances of disease being spread from the tick bite.  WHEN SHOULD YOU SEEK MEDICAL CARE? Contact your health care provider if you are unable to remove a tick from your skin or if a part of the tick breaks off and is stuck in the skin.  After a tick bite, you need to be aware of signs and symptoms that could be related to diseases spread by ticks. Contact your health care provider if you develop any of the following in the days or weeks after the tick bite:  Unexplained fever.  Rash. A circular rash that appears days or weeks after  the tick bite may indicate the possibility of Lyme disease. The rash may resemble a target with a bull's-eye and may occur at a different part of your body than the tick bite.  Redness and swelling in the area of the tick bite.   Tender, swollen lymph glands.   Diarrhea.   Weight loss.   Cough.   Fatigue.   Muscle, joint, or bone pain.   Abdominal pain.   Headache.   Lethargy or a change in your level of consciousness.  Difficulty walking or moving your legs.   Numbness in the legs.   Paralysis.  Shortness of breath.   Confusion.   Repeated vomiting.    This information is not intended to replace advice given to you by your  health care provider. Make sure you discuss any questions you have with your health care provider.   Document Released: 04/22/2000 Document Revised: 05/16/2014 Document Reviewed: 10/03/2012 Elsevier Interactive Patient Education [employer].

## 2015-08-19 NOTE — Assessment & Plan Note (Signed)
Small bump at site of bite. No evidence of infection. Doubt exposure to tick borne illness, regardless has completed 3 wk doxy course. Reassured. Red flags to return discussed.

## 2015-08-19 NOTE — Progress Notes (Signed)
Pre visit review using our clinic review tool, if applicable. No additional management support is needed unless otherwise documented below in the visit note. 

## 2015-08-25 ENCOUNTER — Ambulatory Visit: Payer: Commercial Managed Care - HMO | Admitting: Family Medicine

## 2015-08-27 ENCOUNTER — Encounter: Payer: Self-pay | Admitting: Family Medicine

## 2015-08-27 ENCOUNTER — Ambulatory Visit (INDEPENDENT_AMBULATORY_CARE_PROVIDER_SITE_OTHER): Payer: Commercial Managed Care - HMO | Admitting: Family Medicine

## 2015-08-27 VITALS — BP 142/80 | HR 76 | Temp 97.7°F | Wt 163.8 lb

## 2015-08-27 DIAGNOSIS — R2689 Other abnormalities of gait and mobility: Secondary | ICD-10-CM | POA: Insufficient documentation

## 2015-08-27 MED ORDER — DICLOFENAC SODIUM 75 MG PO TBEC: 75.0000 mg | DELAYED_RELEASE_TABLET | Freq: Two times a day (BID) | ORAL | Status: DC | PRN

## 2015-08-27 NOTE — Progress Notes (Signed)
Pre visit review using our clinic review tool, if applicable. No additional management support is needed unless otherwise documented below in the visit note. 
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
## 2015-08-27 NOTE — Assessment & Plan Note (Addendum)
Anticipate dysequilibrium endorsed due to drug effect (baclofen). rec decrease dose to 5mg  TID PRN.  Discussed importance of good hydration status as well as slow movements. Normal neurological exam today.

## 2015-08-27 NOTE — Patient Instructions (Addendum)
Cut baclofen in half. Ok to continue diclofenac tablets, use sparingly.  Make sure you stay well hydrated Have safe trip to [place].

## 2015-08-29 ENCOUNTER — Encounter: Payer: Self-pay | Admitting: Family Medicine

## 2015-10-01 ENCOUNTER — Encounter: Payer: Self-pay | Admitting: Family Medicine

## 2015-10-01 ENCOUNTER — Ambulatory Visit (INDEPENDENT_AMBULATORY_CARE_PROVIDER_SITE_OTHER): Payer: Commercial Managed Care - HMO | Admitting: Family Medicine

## 2015-10-01 VITALS — BP 126/80 | HR 67 | Temp 97.7°F | Ht 63.0 in | Wt 167.8 lb

## 2015-10-01 DIAGNOSIS — J029 Acute pharyngitis, unspecified: Secondary | ICD-10-CM

## 2015-10-01 DIAGNOSIS — L049 Acute lymphadenitis, unspecified: Secondary | ICD-10-CM

## 2015-10-01 LAB — POCT RAPID STREP A (OFFICE): RAPID STREP A SCREEN: NEGATIVE

## 2015-10-01 NOTE — Progress Notes (Signed)
Pre visit review using our clinic review tool, if applicable. No additional management support is needed unless otherwise documented below in the visit note. 

## 2015-10-01 NOTE — Progress Notes (Signed)
Dr. Frederico Hamman T. Alleyne Lac, MD, [facility] [place], [address] Phone: [phone] Fax: [phone]  10/01/2015  Patient: Dana [name], MRN: [id number], DOB: Sep 21, 1945, 70 y.o.  Primary Physician:  [name], MD   Chief Complaint  Patient presents with  . Sore Throat    hurts to swallow x1 week   Subjective:   Dana [name] is a 70 y.o. very pleasant female patient who presents with the following:  Throat sore for about a week. Was in [place] about 2 weeks ago.  Went to near [place].   She is not feel sick overall are generally, and she does have pain when she swallows and pain to palpation on the right side of her neck.  Past Medical History, Surgical History, Social History, Family History, Problem List, Medications, and Allergies have been reviewed and updated if relevant.  Patient Active Problem List   Diagnosis Date Noted  . Imbalance 08/27/2015  . Tick bite of left lower leg 08/19/2015  . Rib pain on right side 05/05/2015  . Right rotator cuff tendonitis 04/10/2015  . Strain of right trapezius muscle 04/10/2015  . Routine general medical examination at a health care facility 04/29/2014  . Counseling regarding end of life decision making 04/29/2014  . Urge incontinence 04/29/2014  . Prediabetes 12/24/2013  . Elevated blood-pressure reading without diagnosis of hypertension 12/10/2013  . Peroneus longus tendonitis 11/24/2013  . 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  . Hx of diverticulitis of colon 11/07/2012  . Allergic rhinitis 11/07/2012  . High cholesterol 11/07/2012  . Migraines 11/07/2012  . Unspecified hereditary and idiopathic peripheral neuropathy 11/07/2012  . LGSIL Pap smear of vaginal cuff 11/07/2012  . GERD (gastroesophageal reflux disease) 11/07/2012  . Family history of premature coronary heart disease 11/07/2012  . Verruca vulgaris 11/07/2012    Past Medical History  Diagnosis Date    . Anemia   . Arthritis   . Allergy   . Hyperlipidemia   . Migraine   . History of chicken pox   . Diverticulitis   . Kidney stone     Past Surgical History  Procedure Laterality Date  . Appendectomy  1970  . Cholecystectomy  1971  . Tubal ligation    . Abdominal hysterectomy  2006    cervix remains, menorrhagia  . Breast surgery  1995    biopsy-left  . Breast surgery  1995    right milk duct removed  . Lithotripsy    . Dexa  2015    spine -0.4, hip -1.0 WNL    Social History   Social History  . Marital Status: Married    Spouse Name: [name]  . Number of Children: 1  . Years of Education: 12   Occupational History  .      Retired   Social History Main Topics  . Smoking status: Never Smoker   . Smokeless tobacco: Never Used  . Alcohol Use: No  . Drug Use: No  . Sexual Activity: Not on file   Other Topics Concern  . Not on file   Social History Narrative       E xercise 3 times a week.      Patient is married and lives at home with her husband [name]). Patient is retired. Patient has high  school education.   Right handed.   Caffeine- None      HAs living will, HCPOA, full code ( reviewed 2014)  Family History  Problem Relation Age of Onset  . Heart disease Mother 57  . Hyperlipidemia Mother   . Hyperlipidemia Father   . Stroke Father   . Heart disease Father   . Fibromyalgia Sister   . Hyperlipidemia Sister   . Heart disease Brother   . Diabetes Brother     Allergies  Allergen Reactions  . Niacin And Related Itching  . Septra [Sulfamethoxazole-Trimethoprim] Itching    Medication list reviewed and updated in full in [place].   GEN: No acute illnesses, no fevers, chills. GI: No n/v/d, eating normally Pulm: No SOB Interactive and getting along well at home.  Otherwise, ROS is as per the HPI.  Objective:   BP 126/80 mmHg  Pulse 67  Temp(Src) 97.7 F (36.5 C) (Oral)  Ht 5\' 3"  (1.6 m)  Wt 167 lb 12 oz (76.091 kg)  BMI  29.72 kg/m2  SpO2 97%  GEN: WDWN, NAD, Non-toxic, A & O x 3 HEENT: Atraumatic, Normocephalic. Neck supple. No masses, there is one lymph node on the right anterior chain that is tender to palpation and the point of the patient's discomfort.  It is approximately 1 cm in size.. Throat is clear, and it is not red at all and does not have any exudates. Ears and Nose: No external deformity. TMs are clear.  Ears are nontender to palpation. CV: RRR, No M/G/R. No JVD. No thrill. No extra heart sounds. PULM: CTA B, no wheezes, crackles, rhonchi. No retractions. No resp. distress. No accessory muscle use. EXTR: No c/c/e NEURO Normal gait.  PSYCH: Normally interactive. Conversant. Not depressed or anxious appearing.  Calm demeanor.   Laboratory and Imaging Data: Results for orders placed or performed in visit on 10/01/15  Rapid Strep A  Result Value Ref Range   Rapid Strep A Screen Negative Negative     Assessment and Plan:   Lymphadenitis, acute  Sore throat - Plan: Rapid Strep A  Clinically, this is a lymph node that is inflamed.  Mildly tender to palpation.  I suspect this will likely resolve on its own.  I encouraged the patient that she would need to have this followed up on if it persisted for more than a month.  Follow-up: No Follow-up on file.  Orders Placed This Encounter  Procedures  . Rapid Strep A    Signed,  Lissette Schenk T. Quinnley Colasurdo, MD   Patient's Medications  New Prescriptions   No medications on file  Previous Medications   ACETAMINOPHEN (TYLENOL) 325 MG TABLET    Take 650 mg by mouth as needed.   ASPIRIN 81 MG TABLET    Take 81 mg by mouth daily.   BACLOFEN (LIORESAL) 10 MG TABLET    Take 0.5 tablets (5 mg total) by mouth 3 (three) times daily as needed for muscle spasms.   CAMPHOR-MENTHOL-METHYL SAL (SALONPAS) 1.2-5.7-6.3 % PTCH    Apply 1 patch topically as directed.   CARBOXYMETHYLCELLULOSE SODIUM (THERATEARS) 0.25 % SOLN    Apply 1 drop to eye as needed.    CHOLECALCIFEROL (VITAMIN D) 1000 UNITS TABLET    Take 1,000 Units by mouth daily.   CYANOCOBALAMIN (VITAMIN B-12 PO)    Take by mouth.   DICLOFENAC (VOLTAREN) 75 MG EC TABLET    Take 1 tablet (75 mg total) by mouth 2 (two) times daily as needed (shoulder pain).   FLUTICASONE (FLONASE) 50 MCG/ACT NASAL SPRAY    Place 2 sprays into both nostrils daily.   FUROSEMIDE (LASIX)  20 MG TABLET    Take 1 tablet (20 mg total) by mouth as needed.   LACTASE (LACTAID) 3000 UNITS TABLET    Take 1 tablet by mouth as needed.    LOVASTATIN (MEVACOR) 40 MG TABLET    TAKE 1 TABLET AT BEDTIME   MULTIPLE VITAMIN (MULTIVITAMIN) TABLET    Take 1 tablet by mouth daily.   NYSTATIN CREAM (MYCOSTATIN)    Apply topically 2 (two) times daily. Continue for 48 hours after rash resolved.   OMEPRAZOLE (PRILOSEC) 20 MG CAPSULE    TAKE 1 CAPSULE EVERY DAY   TOLTERODINE (DETROL LA) 4 MG 24 HR CAPSULE    TAKE 1 CAPSULE EVERY DAY  (STOP  TOVIAZ  ER)   TRAMADOL (ULTRAM) 50 MG TABLET    Take 1 tablet (50 mg total) by mouth 2 (two) times daily as needed.   TURMERIC CURCUMIN 500 MG CAPS    Take 1 tablet by mouth daily.   XIIDRA 5 % SOLN    PLACE 1 DROP TWICE A DAY INTO BOTH EYES  Modified Medications   No medications on file  Discontinued Medications   No medications on file

## 2015-10-16 ENCOUNTER — Telehealth: Payer: Self-pay | Admitting: *Deleted

## 2015-10-16 NOTE — Telephone Encounter (Signed)
Received fax from [facility] saying that the patient requests a change in medication after pharmacy consultation.  Patient will save up to $524 per year.  Fax in Dr. Synthia Innocent In Box.  Please advise.

## 2015-10-19 MED ORDER — OXYBUTYNIN CHLORIDE 5 MG PO TABS: 5.0000 mg | ORAL_TABLET | Freq: Two times a day (BID) | ORAL | Status: DC | PRN

## 2015-10-19 NOTE — Telephone Encounter (Signed)
Request is tolterodine ER (Detrol LA) --> oxybutynin IR New med sent to pharmacy. plz notify patient. Placed in Kim's box.

## 2015-10-19 NOTE — Telephone Encounter (Signed)
Message left notifying patient.

## 2015-11-19 ENCOUNTER — Encounter: Payer: Self-pay | Admitting: Family Medicine

## 2015-11-19 ENCOUNTER — Ambulatory Visit (INDEPENDENT_AMBULATORY_CARE_PROVIDER_SITE_OTHER): Payer: Commercial Managed Care - HMO | Admitting: Family Medicine

## 2015-11-19 VITALS — BP 130/80 | HR 79 | Temp 98.3°F | Wt 166.0 lb

## 2015-11-19 DIAGNOSIS — G43109 Migraine with aura, not intractable, without status migrainosus: Secondary | ICD-10-CM | POA: Diagnosis not present

## 2015-11-19 DIAGNOSIS — K219 Gastro-esophageal reflux disease without esophagitis: Secondary | ICD-10-CM

## 2015-11-19 MED ORDER — KETOROLAC TROMETHAMINE 30 MG/ML IM SOLN: 30.0000 mg | Freq: Once | INTRAMUSCULAR | Status: DC

## 2015-11-19 MED ORDER — NYSTATIN 100000 UNIT/GM EX CREA: TOPICAL_CREAM | Freq: Two times a day (BID) | CUTANEOUS | Status: DC

## 2015-11-19 MED ORDER — KETOROLAC TROMETHAMINE 30 MG/ML IJ SOLN
30.0000 mg | Freq: Once | INTRAMUSCULAR | Status: AC
  Administered 2015-11-19: 30 mg via INTRAMUSCULAR

## 2015-11-19 MED ORDER — PROMETHAZINE HCL 25 MG/ML IJ SOLN
25.0000 mg | Freq: Once | INTRAMUSCULAR | Status: AC
  Administered 2015-11-19: 25 mg via INTRAMUSCULAR

## 2015-11-19 MED ORDER — DICLOFENAC SODIUM 75 MG PO TBEC: 75.0000 mg | DELAYED_RELEASE_TABLET | Freq: Two times a day (BID) | ORAL | Status: DC | PRN

## 2015-11-19 MED ORDER — BACLOFEN 10 MG PO TABS: 5.0000 mg | ORAL_TABLET | Freq: Three times a day (TID) | ORAL | Status: DC | PRN

## 2015-11-19 NOTE — Addendum Note (Signed)
Addended by: Pilar Grammes on: 11/19/2015 12:23 PM   Modules accepted: Orders

## 2015-11-19 NOTE — Progress Notes (Signed)
BP 130/80 mmHg  Pulse 79  Temp(Src) 98.3 F (36.8 C) (Oral)  Wt 166 lb (75.297 kg)  SpO2 95%   CC: worsening migraines  Subjective:    Patient ID: Dana [name], female    DOB: 06-May-1946, 70 y.o.   MRN: [id number]  HPI: Dana [name] is a 70 y.o. female presenting on 11/19/2015 for Headache and Neck Pain   Recurrent migraines x2 over last week - nauseated, neck pain, malaise, epigastric pain (she has been drinking more OJ - lactose intolerant). No shooting pain down arms, numbness or weakness of arms. She is regularly taking omeprazole 20mg . Takes voltaren tablet as needed for hsoulder pain. Prior to last week rare migraines. Migraines associated with aura "shadowing", photo/phonophobia, nausea.  No fevers/chills, dyspnea or chest pain. No dizziness.   Treating headaches with tylenol. Dramamine last night also helped.   S/p cholecystectomy.  She had to put dog down on Monday.   Would like referral to nutritionist to discuss kidney stone diet.  Colonoscopy 2008 gets done in hospital [facility]) per pt normal  Asks about nutritionist to review diet to prevent future kidney stones.   Relevant past medical, surgical, family and social history reviewed and updated as indicated. Interim medical history since our last visit reviewed. Allergies and medications reviewed and updated. Current Outpatient Prescriptions on File Prior to Visit  Medication Sig  . acetaminophen (TYLENOL) 325 MG tablet Take 650 mg by mouth as needed.  Marland Kitchen aspirin 81 MG tablet Take 81 mg by mouth daily.  . Camphor-Menthol-Methyl Sal (SALONPAS) 1.2-5.7-6.3 % PTCH Apply 1 patch topically as directed.  . Carboxymethylcellulose Sodium (THERATEARS) 0.25 % SOLN Apply 1 drop to eye as needed.  . cholecalciferol (VITAMIN D) 1000 UNITS tablet Take 1,000 Units by mouth daily.  . Cyanocobalamin (VITAMIN B-12 PO) Take by mouth.  . fluticasone (FLONASE) 50 MCG/ACT nasal spray Place 2 sprays into both nostrils daily.  .  furosemide (LASIX) 20 MG tablet Take 1 tablet (20 mg total) by mouth as needed.  . lactase (LACTAID) 3000 UNITS tablet Take 1 tablet by mouth as needed.   . lovastatin (MEVACOR) 40 MG tablet TAKE 1 TABLET AT BEDTIME  . Multiple Vitamin (MULTIVITAMIN) tablet Take 1 tablet by mouth daily.  Marland Kitchen omeprazole (PRILOSEC) 20 MG capsule TAKE 1 CAPSULE EVERY DAY  . oxybutynin (DITROPAN) 5 MG tablet Take 1 tablet (5 mg total) by mouth 2 (two) times daily as needed for bladder spasms.  . traMADol (ULTRAM) 50 MG tablet Take 1 tablet (50 mg total) by mouth 2 (two) times daily as needed.  . Turmeric Curcumin 500 MG CAPS Take 1 tablet by mouth daily.  Marland Kitchen XIIDRA 5 % SOLN PLACE 1 DROP TWICE A DAY INTO BOTH EYES   No current facility-administered medications on file prior to visit.    Review of Systems Per HPI unless specifically indicated in ROS section     Objective:    BP 130/80 mmHg  Pulse 79  Temp(Src) 98.3 F (36.8 C) (Oral)  Wt 166 lb (75.297 kg)  SpO2 95%  Wt Readings from Last 3 Encounters:  11/19/15 166 lb (75.297 kg)  10/01/15 167 lb 12 oz (76.091 kg)  08/27/15 163 lb 12 oz (74.277 kg)    Physical Exam  Constitutional: She is oriented to person, place, and time. She appears well-developed and well-nourished. No distress.  HENT:  Mouth/Throat: Oropharynx is clear and moist. No oropharyngeal exudate.  Neck: Normal range of motion. Neck supple.  Cardiovascular: Normal rate, regular rhythm, normal heart sounds and intact distal pulses.   No murmur heard. Pulmonary/Chest: Effort normal and breath sounds normal. No respiratory distress. She has no wheezes. She has no rales.  Musculoskeletal: She exhibits no edema.  Neurological: She is alert and oriented to person, place, and time.  CN 2-12 intact EOMI FTN intact  Skin: Skin is warm and dry. No rash noted.  Psychiatric: She has a normal mood and affect.  Nursing note and vitals reviewed.     Assessment & Plan:   Problem List Items  Addressed This Visit    Migraine - Primary    nonfocal neurological exam. Worsening migraine over last week corresponding with increased stress of dog's illness, had to put to sleep on Monday. Discussed relation.  For residual headache after migraine - provide with toradol 30mg /phenergan 25mg  IM today.  No need for ppx med.  Discussed trial excedrin migraine PRN abortive therapy.       Relevant Medications   baclofen (LIORESAL) 10 MG tablet   diclofenac (VOLTAREN) 75 MG EC tablet   GERD (gastroesophageal reflux disease)    Anticipate malaise with epigastric discomfort from recent increase in OJ. Suggested back off this, if no improvement, consider increased omeprazole dose. Pt agrees.           Follow up plan: Return if symptoms worsen or fail to improve.  [name], MD

## 2015-11-19 NOTE — Assessment & Plan Note (Signed)
Anticipate malaise with epigastric discomfort from recent increase in OJ. Suggested back off this, if no improvement, consider increased omeprazole dose. Pt agrees.

## 2015-11-19 NOTE — Assessment & Plan Note (Signed)
nonfocal neurological exam. Worsening migraine over last week corresponding with increased stress of dog's illness, had to put to sleep on Monday. Discussed relation.  For residual headache after migraine - provide with toradol 30mg /phenergan 25mg  IM today.  No need for ppx med.  Discussed trial excedrin migraine PRN abortive therapy.

## 2015-11-19 NOTE — Progress Notes (Signed)
Pre visit review using our clinic review tool, if applicable. No additional management support is needed unless otherwise documented below in the visit note. 

## 2015-11-19 NOTE — Patient Instructions (Addendum)
Bring me copy of your colonoscopy report or find where it was done so we can get records.  Check with urology about preferred nutritionist Back off orange juice as I think it's contributing to increased acid production in stomach leading to irritation.  I think migraines are coming from stress - try excedrin migraine next time you feel migraine coming on. Should get better with time.  Phenergan/toradol IM today.

## 2015-11-20 ENCOUNTER — Ambulatory Visit: Payer: Commercial Managed Care - HMO | Admitting: Family Medicine

## 2015-12-01 ENCOUNTER — Telehealth (INDEPENDENT_AMBULATORY_CARE_PROVIDER_SITE_OTHER): Payer: Commercial Managed Care - HMO

## 2015-12-01 DIAGNOSIS — R350 Frequency of micturition: Secondary | ICD-10-CM

## 2015-12-01 LAB — POC URINALSYSI DIPSTICK (AUTOMATED)
BILIRUBIN UA: NEGATIVE
GLUCOSE UA: NEGATIVE
Ketones, UA: NEGATIVE
Leukocytes, UA: NEGATIVE
NITRITE UA: NEGATIVE
Protein, UA: NEGATIVE
Spec Grav, UA: 1.02
UROBILINOGEN UA: 0.2
pH, UA: 6

## 2015-12-01 NOTE — Telephone Encounter (Signed)
Results in chart. Urine spun and drawn for culture.

## 2015-12-01 NOTE — Telephone Encounter (Signed)
Micro: WBC rare RBC 0-2 Bact tr Casts none Epi none UCx not sent

## 2015-12-01 NOTE — Telephone Encounter (Signed)
Patient notified. Symptoms she was describing (frequency, incomplete emptying, lower abd pain) made me question possible UTI. She is dropping off a UA today and scheduled a follow up for Friday. She still wants to discuss the meds with you even if no UTI.

## 2015-12-01 NOTE — Telephone Encounter (Signed)
Stop oxybutynin IR  Would offer trial of oxybutynin ER formulation or can return to Detrol LA.

## 2015-12-01 NOTE — Telephone Encounter (Signed)
Pt thinks oxybutynin is causing pt not to be able to urinate on and off. Pt has urge to urinate and cannot void. No swelling in abdomen but has pressure feeling. This on and off problem urinating started this past weekend. Pt taking oxybutynin for 2 weeks. No burning or pain upon urination. Pt request cb[facility].

## 2015-12-02 MED ORDER — OXYBUTYNIN CHLORIDE ER 5 MG PO TB24: 5.0000 mg | ORAL_TABLET | Freq: Every day | ORAL | 1 refills | Status: DC

## 2015-12-02 NOTE — Telephone Encounter (Signed)
Patient notified. She said she would like to hold off on taking the ER pill for now and keep her scheduled follow up for Friday to discuss it with you. She hasn't been taking anything over the last couple of days and is doing okay.

## 2015-12-02 NOTE — Telephone Encounter (Signed)
plz notify patient - UA ok.  Will send in ER oxybutynin 1 mo to try - update [place] with effect. If tolerated well we may increase dose

## 2015-12-04 ENCOUNTER — Encounter: Payer: Self-pay | Admitting: Family Medicine

## 2015-12-04 ENCOUNTER — Ambulatory Visit (INDEPENDENT_AMBULATORY_CARE_PROVIDER_SITE_OTHER): Payer: Commercial Managed Care - HMO | Admitting: Family Medicine

## 2015-12-04 VITALS — BP 124/70 | HR 76 | Temp 98.0°F | Wt 168.5 lb

## 2015-12-04 DIAGNOSIS — N3941 Urge incontinence: Secondary | ICD-10-CM

## 2015-12-04 DIAGNOSIS — M542 Cervicalgia: Secondary | ICD-10-CM | POA: Diagnosis not present

## 2015-12-04 DIAGNOSIS — E785 Hyperlipidemia, unspecified: Secondary | ICD-10-CM

## 2015-12-04 DIAGNOSIS — N200 Calculus of kidney: Secondary | ICD-10-CM

## 2015-12-04 DIAGNOSIS — Z8249 Family history of ischemic heart disease and other diseases of the circulatory system: Secondary | ICD-10-CM

## 2015-12-04 DIAGNOSIS — R7303 Prediabetes: Secondary | ICD-10-CM

## 2015-12-04 DIAGNOSIS — E663 Overweight: Secondary | ICD-10-CM

## 2015-12-04 MED ORDER — METHOCARBAMOL 500 MG PO TABS: 500.0000 mg | ORAL_TABLET | Freq: Three times a day (TID) | ORAL | 0 refills | Status: DC | PRN

## 2015-12-04 NOTE — Progress Notes (Signed)
Pre visit review using our clinic review tool, if applicable. No additional management support is needed unless otherwise documented below in the visit note. 
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
## 2015-12-04 NOTE — Patient Instructions (Addendum)
No coupons available today. Trial extended release oxybutynin at pharmacy. Let us know how you tolerate this. Keep follow up with Dr Louis Meckel. For neck pain - due to muscle spasm and strain. Stop baclofen. Trial robaxin muscle relaxant. Use heating pad at neck. We will refer you to physical therapy. Let us know if not better for repeat imaging.

## 2015-12-06 ENCOUNTER — Encounter: Payer: Self-pay | Admitting: Family Medicine

## 2015-12-06 DIAGNOSIS — Z87442 Personal history of urinary calculi: Secondary | ICD-10-CM | POA: Insufficient documentation

## 2015-12-06 DIAGNOSIS — E739 Lactose intolerance, unspecified: Secondary | ICD-10-CM | POA: Insufficient documentation

## 2015-12-06 DIAGNOSIS — N200 Calculus of kidney: Secondary | ICD-10-CM | POA: Insufficient documentation

## 2015-12-06 LAB — HM COLONOSCOPY

## 2015-12-06 NOTE — Assessment & Plan Note (Addendum)
First time I'm evaluating this. Anticipate due to prolonged muscle strain/spasm and not cervical radiculopathy. Will stop baclofen given concern for urinary retention, start methocarbamol muscle relaxant (less risk of retention), rec heating pad, and refer to physical therapy. If no improvement with this, low threshold to re image cervical neck (likely cervical CT). Pt agrees with plan.

## 2015-12-06 NOTE — Assessment & Plan Note (Signed)
Compliant with pravachol - stable on current regimen. ASCVD 73yr risk = 7.6%.

## 2015-12-06 NOTE — Assessment & Plan Note (Addendum)
Has tried Toviaz, Detrol LA, and oxybutynin IR.  Stop baclofen and IR oxybutynin (may have caused some urinary retention), trial ER oxybutynin. If persistent urinary retention, will refer to URO to discuss possible urodynamics. She does have f/u with Dr Louis Meckel 01/2016.  Recent UA without signs of infection.

## 2015-12-06 NOTE — Assessment & Plan Note (Addendum)
Will refer to dietician per patient request for prediabetes, overweight and h/o kidney stones.  She also does have fmhx premature CAD.

## 2015-12-09 DIAGNOSIS — N3281 Overactive bladder: Secondary | ICD-10-CM | POA: Diagnosis not present

## 2015-12-09 DIAGNOSIS — M542 Cervicalgia: Secondary | ICD-10-CM | POA: Diagnosis not present

## 2015-12-09 DIAGNOSIS — M2560 Stiffness of unspecified joint, not elsewhere classified: Secondary | ICD-10-CM | POA: Diagnosis not present

## 2015-12-13 ENCOUNTER — Encounter: Payer: Self-pay | Admitting: Family Medicine

## 2015-12-18 DIAGNOSIS — M542 Cervicalgia: Secondary | ICD-10-CM | POA: Diagnosis not present

## 2015-12-18 DIAGNOSIS — M2560 Stiffness of unspecified joint, not elsewhere classified: Secondary | ICD-10-CM | POA: Diagnosis not present

## 2015-12-21 DIAGNOSIS — M2560 Stiffness of unspecified joint, not elsewhere classified: Secondary | ICD-10-CM | POA: Diagnosis not present

## 2015-12-21 DIAGNOSIS — M542 Cervicalgia: Secondary | ICD-10-CM | POA: Diagnosis not present

## 2015-12-24 DIAGNOSIS — L905 Scar conditions and fibrosis of skin: Secondary | ICD-10-CM | POA: Diagnosis not present

## 2015-12-24 DIAGNOSIS — M2560 Stiffness of unspecified joint, not elsewhere classified: Secondary | ICD-10-CM | POA: Diagnosis not present

## 2015-12-24 DIAGNOSIS — M62838 Other muscle spasm: Secondary | ICD-10-CM | POA: Diagnosis not present

## 2015-12-24 DIAGNOSIS — M6281 Muscle weakness (generalized): Secondary | ICD-10-CM | POA: Diagnosis not present

## 2015-12-24 DIAGNOSIS — R102 Pelvic and perineal pain: Secondary | ICD-10-CM | POA: Diagnosis not present

## 2015-12-24 DIAGNOSIS — M542 Cervicalgia: Secondary | ICD-10-CM | POA: Diagnosis not present

## 2015-12-24 DIAGNOSIS — R278 Other lack of coordination: Secondary | ICD-10-CM | POA: Diagnosis not present

## 2015-12-24 DIAGNOSIS — R3915 Urgency of urination: Secondary | ICD-10-CM | POA: Diagnosis not present

## 2015-12-28 DIAGNOSIS — M542 Cervicalgia: Secondary | ICD-10-CM | POA: Diagnosis not present

## 2015-12-28 DIAGNOSIS — M2560 Stiffness of unspecified joint, not elsewhere classified: Secondary | ICD-10-CM | POA: Diagnosis not present

## 2015-12-30 DIAGNOSIS — R102 Pelvic and perineal pain: Secondary | ICD-10-CM | POA: Diagnosis not present

## 2015-12-30 DIAGNOSIS — N3281 Overactive bladder: Secondary | ICD-10-CM | POA: Diagnosis not present

## 2015-12-30 DIAGNOSIS — M6281 Muscle weakness (generalized): Secondary | ICD-10-CM | POA: Diagnosis not present

## 2015-12-30 DIAGNOSIS — M62838 Other muscle spasm: Secondary | ICD-10-CM | POA: Diagnosis not present

## 2015-12-30 DIAGNOSIS — M2560 Stiffness of unspecified joint, not elsewhere classified: Secondary | ICD-10-CM | POA: Diagnosis not present

## 2015-12-30 DIAGNOSIS — R278 Other lack of coordination: Secondary | ICD-10-CM | POA: Diagnosis not present

## 2015-12-30 DIAGNOSIS — M542 Cervicalgia: Secondary | ICD-10-CM | POA: Diagnosis not present

## 2016-01-04 DIAGNOSIS — M2560 Stiffness of unspecified joint, not elsewhere classified: Secondary | ICD-10-CM | POA: Diagnosis not present

## 2016-01-04 DIAGNOSIS — M542 Cervicalgia: Secondary | ICD-10-CM | POA: Diagnosis not present

## 2016-01-05 DIAGNOSIS — R278 Other lack of coordination: Secondary | ICD-10-CM | POA: Diagnosis not present

## 2016-01-05 DIAGNOSIS — M6281 Muscle weakness (generalized): Secondary | ICD-10-CM | POA: Diagnosis not present

## 2016-01-05 DIAGNOSIS — N3281 Overactive bladder: Secondary | ICD-10-CM | POA: Diagnosis not present

## 2016-01-05 DIAGNOSIS — M62838 Other muscle spasm: Secondary | ICD-10-CM | POA: Diagnosis not present

## 2016-01-06 DIAGNOSIS — M542 Cervicalgia: Secondary | ICD-10-CM | POA: Diagnosis not present

## 2016-01-06 DIAGNOSIS — M2560 Stiffness of unspecified joint, not elsewhere classified: Secondary | ICD-10-CM | POA: Diagnosis not present

## 2016-01-08 ENCOUNTER — Other Ambulatory Visit: Payer: Self-pay

## 2016-01-08 MED ORDER — FLUTICASONE PROPIONATE 50 MCG/ACT NA SUSP: 2.0000 | Freq: Every day | NASAL | 3 refills | Status: DC

## 2016-01-08 NOTE — Telephone Encounter (Signed)
Rx sent electronically.  

## 2016-01-12 DIAGNOSIS — M542 Cervicalgia: Secondary | ICD-10-CM | POA: Diagnosis not present

## 2016-01-12 DIAGNOSIS — M2560 Stiffness of unspecified joint, not elsewhere classified: Secondary | ICD-10-CM | POA: Diagnosis not present

## 2016-01-14 DIAGNOSIS — R102 Pelvic and perineal pain: Secondary | ICD-10-CM | POA: Diagnosis not present

## 2016-01-14 DIAGNOSIS — M542 Cervicalgia: Secondary | ICD-10-CM | POA: Diagnosis not present

## 2016-01-14 DIAGNOSIS — M62838 Other muscle spasm: Secondary | ICD-10-CM | POA: Diagnosis not present

## 2016-01-14 DIAGNOSIS — N3281 Overactive bladder: Secondary | ICD-10-CM | POA: Diagnosis not present

## 2016-01-14 DIAGNOSIS — M2560 Stiffness of unspecified joint, not elsewhere classified: Secondary | ICD-10-CM | POA: Diagnosis not present

## 2016-01-14 DIAGNOSIS — R278 Other lack of coordination: Secondary | ICD-10-CM | POA: Diagnosis not present

## 2016-01-14 DIAGNOSIS — M6281 Muscle weakness (generalized): Secondary | ICD-10-CM | POA: Diagnosis not present

## 2016-01-15 ENCOUNTER — Other Ambulatory Visit: Payer: Self-pay | Admitting: *Deleted

## 2016-01-16 ENCOUNTER — Encounter: Payer: Self-pay | Admitting: Family Medicine

## 2016-01-19 DIAGNOSIS — M542 Cervicalgia: Secondary | ICD-10-CM | POA: Diagnosis not present

## 2016-01-19 DIAGNOSIS — M2560 Stiffness of unspecified joint, not elsewhere classified: Secondary | ICD-10-CM | POA: Diagnosis not present

## 2016-01-21 DIAGNOSIS — M542 Cervicalgia: Secondary | ICD-10-CM | POA: Diagnosis not present

## 2016-01-21 DIAGNOSIS — M2560 Stiffness of unspecified joint, not elsewhere classified: Secondary | ICD-10-CM | POA: Diagnosis not present

## 2016-01-22 ENCOUNTER — Ambulatory Visit (INDEPENDENT_AMBULATORY_CARE_PROVIDER_SITE_OTHER): Payer: Commercial Managed Care - HMO | Admitting: Family Medicine

## 2016-01-22 ENCOUNTER — Other Ambulatory Visit: Payer: Self-pay | Admitting: Family Medicine

## 2016-01-22 ENCOUNTER — Telehealth: Payer: Self-pay

## 2016-01-22 ENCOUNTER — Encounter: Payer: Self-pay | Admitting: Family Medicine

## 2016-01-22 VITALS — BP 126/78 | HR 75 | Temp 97.5°F | Wt 169.8 lb

## 2016-01-22 DIAGNOSIS — N3941 Urge incontinence: Secondary | ICD-10-CM | POA: Diagnosis not present

## 2016-01-22 DIAGNOSIS — R278 Other lack of coordination: Secondary | ICD-10-CM | POA: Diagnosis not present

## 2016-01-22 DIAGNOSIS — M6281 Muscle weakness (generalized): Secondary | ICD-10-CM | POA: Diagnosis not present

## 2016-01-22 DIAGNOSIS — Z23 Encounter for immunization: Secondary | ICD-10-CM

## 2016-01-22 DIAGNOSIS — R102 Pelvic and perineal pain: Secondary | ICD-10-CM | POA: Diagnosis not present

## 2016-01-22 DIAGNOSIS — M62838 Other muscle spasm: Secondary | ICD-10-CM | POA: Diagnosis not present

## 2016-01-22 NOTE — Addendum Note (Signed)
Addended by: Lurlean Nanny on: 01/22/2016 09:17 AM   Modules accepted: Orders

## 2016-01-22 NOTE — Assessment & Plan Note (Addendum)
Doing better with vaginal valium and pelvic therapy Bea Graff). Sees [facility] (Dr Louis Meckel).

## 2016-01-22 NOTE — Telephone Encounter (Signed)
Cologuard form faxed to [employer] w/ demographics and insurance card

## 2016-01-22 NOTE — Progress Notes (Signed)
Pre visit review using our clinic review tool, if applicable. No additional management support is needed unless otherwise documented below in the visit note. 

## 2016-01-22 NOTE — Patient Instructions (Addendum)
Flu shot today.  We will sign you up for cologuard.  Keep appointment in December for medicare wellness visit.  Ask urology to send me latest note.

## 2016-01-22 NOTE — Progress Notes (Signed)
BP 126/78   Pulse 75   Temp 97.5 F (36.4 C) (Oral)   Wt 169 lb 12 oz (77 kg)   SpO2 99%   BMI 30.07 kg/m    CC: discuss colonoscopy Subjective:    Patient ID: Dana [name], female    DOB: 1945/10/16, 71 y.o.   MRN: [id number]  HPI: Dana [name] is a 70 y.o. female presenting on 01/22/2016 for Results (Colonoscopy) and Flu Vaccine (wants one today)   Upcoming trip to [place].   Colon cancer screening - discussed, would like cologuard.   No fmhx colon cancer or polyp.   COLONOSCOPY 11/2005 diverticulosis o/w WNL Mikel Cella)  Recent urology appt - off oxybutynin, now trial of vaginal valium. Passed urodynamic studies. Also started pelvic PT. Bladder has not dropped. This is all helping.   Relevant past medical, surgical, family and social history reviewed and updated as indicated. Interim medical history since our last visit reviewed. Allergies and medications reviewed and updated. Current Outpatient Prescriptions on File Prior to Visit  Medication Sig  . acetaminophen (TYLENOL) 325 MG tablet Take 650 mg by mouth as needed.  Marland Kitchen aspirin 81 MG tablet Take 81 mg by mouth daily.  . Camphor-Menthol-Methyl Sal (SALONPAS) 1.2-5.7-6.3 % PTCH Apply 1 patch topically as directed.  . Carboxymethylcellulose Sodium (THERATEARS) 0.25 % SOLN Apply 1 drop to eye as needed.  . cholecalciferol (VITAMIN D) 1000 UNITS tablet Take 1,000 Units by mouth daily.  . Cyanocobalamin (VITAMIN B-12 PO) Take by mouth.  . diclofenac (VOLTAREN) 75 MG EC tablet Take 1 tablet (75 mg total) by mouth 2 (two) times daily as needed (shoulder pain).  . fluticasone (FLONASE) 50 MCG/ACT nasal spray Place 2 sprays into both nostrils daily.  . furosemide (LASIX) 20 MG tablet Take 1 tablet (20 mg total) by mouth as needed.  . lactase (LACTAID) 3000 UNITS tablet Take 1 tablet by mouth as needed.   . lovastatin (MEVACOR) 40 MG tablet TAKE 1 TABLET AT BEDTIME  . methocarbamol (ROBAXIN) 500 MG tablet Take 1 tablet (500 mg  total) by mouth 3 (three) times daily as needed for muscle spasms.  . Multiple Vitamin (MULTIVITAMIN) tablet Take 1 tablet by mouth daily.  Marland Kitchen nystatin cream (MYCOSTATIN) Apply topically 2 (two) times daily. Continue for 48 hours after rash resolved.  Marland Kitchen omeprazole (PRILOSEC) 20 MG capsule TAKE 1 CAPSULE EVERY DAY  . traMADol (ULTRAM) 50 MG tablet Take 1 tablet (50 mg total) by mouth 2 (two) times daily as needed.  . Turmeric Curcumin 500 MG CAPS Take 1 tablet by mouth daily.  Marland Kitchen XIIDRA 5 % SOLN PLACE 1 DROP TWICE A DAY INTO BOTH EYES   No current facility-administered medications on file prior to visit.     Review of Systems Per HPI unless specifically indicated in ROS section     Objective:    BP 126/78   Pulse 75   Temp 97.5 F (36.4 C) (Oral)   Wt 169 lb 12 oz (77 kg)   SpO2 99%   BMI 30.07 kg/m   Wt Readings from Last 3 Encounters:  01/22/16 169 lb 12 oz (77 kg)  12/04/15 168 lb 8 oz (76.4 kg)  11/19/15 166 lb (75.3 kg)    Physical Exam  Constitutional: She appears well-developed and well-nourished. No distress.  Nursing note and vitals reviewed.  Results for orders placed or performed in visit on 12/13/15  HM COLONOSCOPY  Result Value Ref Range   HM Colonoscopy See Report See  Report, Patient Reported Normal      Assessment & Plan:  Colon cancer screening - will sign patient up for cologuard.  Problem List Items Addressed This Visit    Urge incontinence - Primary    Doing better with vaginal valium and pelvic therapy Bea Graff). Sees [facility] (Dr Louis Meckel).        Other Visit Diagnoses   None.      Follow up plan: Return as needed.  [name], MD

## 2016-01-25 DIAGNOSIS — M62838 Other muscle spasm: Secondary | ICD-10-CM | POA: Diagnosis not present

## 2016-01-25 DIAGNOSIS — R278 Other lack of coordination: Secondary | ICD-10-CM | POA: Diagnosis not present

## 2016-01-25 DIAGNOSIS — R3915 Urgency of urination: Secondary | ICD-10-CM | POA: Diagnosis not present

## 2016-01-25 DIAGNOSIS — R102 Pelvic and perineal pain: Secondary | ICD-10-CM | POA: Diagnosis not present

## 2016-01-25 DIAGNOSIS — M6281 Muscle weakness (generalized): Secondary | ICD-10-CM | POA: Diagnosis not present

## 2016-01-26 DIAGNOSIS — M2560 Stiffness of unspecified joint, not elsewhere classified: Secondary | ICD-10-CM | POA: Diagnosis not present

## 2016-01-26 DIAGNOSIS — M542 Cervicalgia: Secondary | ICD-10-CM | POA: Diagnosis not present

## 2016-01-28 DIAGNOSIS — M6281 Muscle weakness (generalized): Secondary | ICD-10-CM | POA: Diagnosis not present

## 2016-01-28 DIAGNOSIS — R102 Pelvic and perineal pain: Secondary | ICD-10-CM | POA: Diagnosis not present

## 2016-01-28 DIAGNOSIS — M62838 Other muscle spasm: Secondary | ICD-10-CM | POA: Diagnosis not present

## 2016-01-28 DIAGNOSIS — R278 Other lack of coordination: Secondary | ICD-10-CM | POA: Diagnosis not present

## 2016-01-28 DIAGNOSIS — L905 Scar conditions and fibrosis of skin: Secondary | ICD-10-CM | POA: Diagnosis not present

## 2016-01-28 DIAGNOSIS — N3281 Overactive bladder: Secondary | ICD-10-CM | POA: Diagnosis not present

## 2016-01-30 ENCOUNTER — Encounter: Payer: Self-pay | Admitting: Family Medicine

## 2016-02-03 DIAGNOSIS — R102 Pelvic and perineal pain: Secondary | ICD-10-CM | POA: Diagnosis not present

## 2016-02-03 DIAGNOSIS — M62838 Other muscle spasm: Secondary | ICD-10-CM | POA: Diagnosis not present

## 2016-02-03 DIAGNOSIS — N3281 Overactive bladder: Secondary | ICD-10-CM | POA: Diagnosis not present

## 2016-02-03 DIAGNOSIS — R278 Other lack of coordination: Secondary | ICD-10-CM | POA: Diagnosis not present

## 2016-02-03 DIAGNOSIS — L905 Scar conditions and fibrosis of skin: Secondary | ICD-10-CM | POA: Diagnosis not present

## 2016-02-03 DIAGNOSIS — M6281 Muscle weakness (generalized): Secondary | ICD-10-CM | POA: Diagnosis not present

## 2016-03-29 DIAGNOSIS — H18463 Peripheral corneal degeneration, bilateral: Secondary | ICD-10-CM | POA: Diagnosis not present

## 2016-03-29 DIAGNOSIS — H2513 Age-related nuclear cataract, bilateral: Secondary | ICD-10-CM | POA: Diagnosis not present

## 2016-03-29 DIAGNOSIS — H524 Presbyopia: Secondary | ICD-10-CM | POA: Diagnosis not present

## 2016-03-29 DIAGNOSIS — E119 Type 2 diabetes mellitus without complications: Secondary | ICD-10-CM | POA: Diagnosis not present

## 2016-03-29 DIAGNOSIS — H04123 Dry eye syndrome of bilateral lacrimal glands: Secondary | ICD-10-CM | POA: Diagnosis not present

## 2016-03-29 LAB — HM DIABETES EYE EXAM

## 2016-03-30 ENCOUNTER — Other Ambulatory Visit: Payer: Self-pay | Admitting: Family Medicine

## 2016-03-30 DIAGNOSIS — Z1231 Encounter for screening mammogram for malignant neoplasm of breast: Secondary | ICD-10-CM

## 2016-03-30 DIAGNOSIS — Z1212 Encounter for screening for malignant neoplasm of rectum: Secondary | ICD-10-CM | POA: Diagnosis not present

## 2016-03-30 DIAGNOSIS — Z1211 Encounter for screening for malignant neoplasm of colon: Secondary | ICD-10-CM | POA: Diagnosis not present

## 2016-03-30 LAB — COLOGUARD: Cologuard: NEGATIVE

## 2016-04-04 DIAGNOSIS — R102 Pelvic and perineal pain: Secondary | ICD-10-CM | POA: Diagnosis not present

## 2016-04-04 DIAGNOSIS — N3281 Overactive bladder: Secondary | ICD-10-CM | POA: Diagnosis not present

## 2016-04-04 DIAGNOSIS — R278 Other lack of coordination: Secondary | ICD-10-CM | POA: Diagnosis not present

## 2016-04-04 DIAGNOSIS — M62838 Other muscle spasm: Secondary | ICD-10-CM | POA: Diagnosis not present

## 2016-04-04 DIAGNOSIS — M6281 Muscle weakness (generalized): Secondary | ICD-10-CM | POA: Diagnosis not present

## 2016-04-12 ENCOUNTER — Encounter: Payer: Self-pay | Admitting: *Deleted

## 2016-04-12 DIAGNOSIS — M6281 Muscle weakness (generalized): Secondary | ICD-10-CM | POA: Diagnosis not present

## 2016-04-12 DIAGNOSIS — R102 Pelvic and perineal pain: Secondary | ICD-10-CM | POA: Diagnosis not present

## 2016-04-12 DIAGNOSIS — M62838 Other muscle spasm: Secondary | ICD-10-CM | POA: Diagnosis not present

## 2016-04-12 DIAGNOSIS — N3281 Overactive bladder: Secondary | ICD-10-CM | POA: Diagnosis not present

## 2016-04-22 ENCOUNTER — Other Ambulatory Visit: Payer: Self-pay | Admitting: Family Medicine

## 2016-04-25 ENCOUNTER — Other Ambulatory Visit: Payer: Self-pay | Admitting: Obstetrics and Gynecology

## 2016-04-25 ENCOUNTER — Other Ambulatory Visit: Payer: Self-pay | Admitting: Family Medicine

## 2016-04-25 DIAGNOSIS — R7303 Prediabetes: Secondary | ICD-10-CM

## 2016-04-25 DIAGNOSIS — Z01419 Encounter for gynecological examination (general) (routine) without abnormal findings: Secondary | ICD-10-CM | POA: Diagnosis not present

## 2016-04-25 DIAGNOSIS — Z1231 Encounter for screening mammogram for malignant neoplasm of breast: Secondary | ICD-10-CM | POA: Diagnosis not present

## 2016-04-25 DIAGNOSIS — Z124 Encounter for screening for malignant neoplasm of cervix: Secondary | ICD-10-CM | POA: Diagnosis not present

## 2016-04-25 DIAGNOSIS — E785 Hyperlipidemia, unspecified: Secondary | ICD-10-CM

## 2016-04-25 LAB — HM MAMMOGRAPHY

## 2016-04-26 DIAGNOSIS — R102 Pelvic and perineal pain: Secondary | ICD-10-CM | POA: Diagnosis not present

## 2016-04-26 DIAGNOSIS — R278 Other lack of coordination: Secondary | ICD-10-CM | POA: Diagnosis not present

## 2016-04-26 DIAGNOSIS — M62838 Other muscle spasm: Secondary | ICD-10-CM | POA: Diagnosis not present

## 2016-04-26 DIAGNOSIS — M6281 Muscle weakness (generalized): Secondary | ICD-10-CM | POA: Diagnosis not present

## 2016-04-26 DIAGNOSIS — N3281 Overactive bladder: Secondary | ICD-10-CM | POA: Diagnosis not present

## 2016-04-26 LAB — CYTOLOGY - PAP

## 2016-04-28 ENCOUNTER — Other Ambulatory Visit (INDEPENDENT_AMBULATORY_CARE_PROVIDER_SITE_OTHER): Payer: Commercial Managed Care - HMO

## 2016-04-28 DIAGNOSIS — E785 Hyperlipidemia, unspecified: Secondary | ICD-10-CM

## 2016-04-28 DIAGNOSIS — R102 Pelvic and perineal pain: Secondary | ICD-10-CM | POA: Diagnosis not present

## 2016-04-28 DIAGNOSIS — L905 Scar conditions and fibrosis of skin: Secondary | ICD-10-CM | POA: Diagnosis not present

## 2016-04-28 DIAGNOSIS — R7303 Prediabetes: Secondary | ICD-10-CM | POA: Diagnosis not present

## 2016-04-28 DIAGNOSIS — M6281 Muscle weakness (generalized): Secondary | ICD-10-CM | POA: Diagnosis not present

## 2016-04-28 DIAGNOSIS — R3915 Urgency of urination: Secondary | ICD-10-CM | POA: Diagnosis not present

## 2016-04-28 DIAGNOSIS — M62838 Other muscle spasm: Secondary | ICD-10-CM | POA: Diagnosis not present

## 2016-04-28 LAB — LIPID PANEL
CHOL/HDL RATIO: 4
Cholesterol: 191 mg/dL (ref 0–200)
HDL: 47.7 mg/dL (ref >39.00)
LDL CALC: 117 mg/dL — AB (ref 0–99)
NONHDL: 143.56
Triglycerides: 135 mg/dL (ref 0.0–149.0)
VLDL: 27 mg/dL (ref 0.0–40.0)

## 2016-04-28 LAB — BASIC METABOLIC PANEL
BUN: 17 mg/dL (ref 6–23)
CHLORIDE: 105 meq/L (ref 96–112)
CO2: 33 mEq/L — ABNORMAL HIGH (ref 19–32)
CREATININE: 0.73 mg/dL (ref 0.40–1.20)
Calcium: 9.4 mg/dL (ref 8.4–10.5)
GFR: 83.95 mL/min (ref >60.00)
Glucose, Bld: 116 mg/dL — ABNORMAL HIGH (ref 70–99)
POTASSIUM: 4.1 meq/L (ref 3.5–5.1)
Sodium: 143 mEq/L (ref 135–145)

## 2016-04-28 LAB — HEMOGLOBIN A1C: HEMOGLOBIN A1C: 6.2 % (ref 4.6–6.5)

## 2016-05-03 ENCOUNTER — Encounter: Payer: Commercial Managed Care - HMO | Admitting: Family Medicine

## 2016-05-03 ENCOUNTER — Encounter: Payer: Self-pay | Admitting: Family Medicine

## 2016-05-03 ENCOUNTER — Ambulatory Visit (INDEPENDENT_AMBULATORY_CARE_PROVIDER_SITE_OTHER): Payer: Commercial Managed Care - HMO | Admitting: Family Medicine

## 2016-05-03 ENCOUNTER — Telehealth: Payer: Self-pay

## 2016-05-03 VITALS — BP 136/74 | HR 72 | Temp 97.8°F | Ht 63.0 in | Wt 170.2 lb

## 2016-05-03 DIAGNOSIS — M6289 Other specified disorders of muscle: Secondary | ICD-10-CM

## 2016-05-03 DIAGNOSIS — Z0000 Encounter for general adult medical examination without abnormal findings: Secondary | ICD-10-CM

## 2016-05-03 DIAGNOSIS — Z7189 Other specified counseling: Secondary | ICD-10-CM

## 2016-05-03 DIAGNOSIS — G609 Hereditary and idiopathic neuropathy, unspecified: Secondary | ICD-10-CM

## 2016-05-03 DIAGNOSIS — R7303 Prediabetes: Secondary | ICD-10-CM

## 2016-05-03 DIAGNOSIS — M542 Cervicalgia: Secondary | ICD-10-CM

## 2016-05-03 DIAGNOSIS — K219 Gastro-esophageal reflux disease without esophagitis: Secondary | ICD-10-CM

## 2016-05-03 DIAGNOSIS — Z23 Encounter for immunization: Secondary | ICD-10-CM | POA: Diagnosis not present

## 2016-05-03 DIAGNOSIS — E785 Hyperlipidemia, unspecified: Secondary | ICD-10-CM

## 2016-05-03 MED ORDER — FUROSEMIDE 20 MG PO TABS: 20.0000 mg | ORAL_TABLET | ORAL | 3 refills | Status: DC | PRN

## 2016-05-03 MED ORDER — FUROSEMIDE 20 MG PO TABS: 20.0000 mg | ORAL_TABLET | Freq: Every day | ORAL | 3 refills | Status: DC | PRN

## 2016-05-03 NOTE — Assessment & Plan Note (Signed)
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

## 2016-05-03 NOTE — Addendum Note (Signed)
Addended by: Royann Shivers A on: 05/03/2016 11:31 AM   Modules accepted: Orders

## 2016-05-03 NOTE — Assessment & Plan Note (Signed)
Improved with PT.

## 2016-05-03 NOTE — Assessment & Plan Note (Signed)
Completed PFPT. Doing well overall. Has valium vaginally to use PRN.

## 2016-05-03 NOTE — Assessment & Plan Note (Signed)
Advanced directive discussion - HCPOA is husband. Has living will. Full code. Asked to bring [place] copy.

## 2016-05-03 NOTE — Assessment & Plan Note (Signed)

## 2016-05-03 NOTE — Assessment & Plan Note (Signed)
Preventative protocols reviewed and updated unless pt declined. Discussed healthy diet and lifestyle.  

## 2016-05-03 NOTE — Progress Notes (Signed)
BP 136/74   Pulse 72   Temp 97.8 F (36.6 C) (Oral)   Ht 5\' 3"  (1.6 m)   Wt 170 lb 4 oz (77.2 kg)   BMI 30.16 kg/m    CC: medicare wellness visit Subjective:    Patient ID: Dana [name], female    DOB: August 06, 1945, 70 y.o.   MRN: [id number]  HPI: Dana [name] is a 70 y.o. female presenting on 05/03/2016 for [place] urology OT for urine incontinence - has found this helpful. She did complete cervical neck PT which helped.   Hearing screen - passed Vision screen - exam with eye doctor Fall risk screen - passed Depression screen - passed  Preventative: COLONOSCOPY 11/2005; diverticulosis o/w WNL Mikel Cella). cologuard WNL 2017 Lung cancer screening - not eligible Breast cancer screening - 3D mammogram 04/2016 at [facility] Well woman exam - with GYN Dr Rogue Bussing. Normal pap smear.  DEXA 2015 spine -0.4, hip -1.0 WNL Flu shot - yearly Td unsure Pneumovax 2012, prevnar 2015, pneumovax today Shingles shot - 2010. h/o shingles x2 Advanced directive discussion - HCPOA is husband. Has living will. Full code. Asked to bring [place] copy.  Seat belt use discussed  Sunscreen use and skin screen discussed  Non smoker Alcohol - rare  Patient is married and lives at home with her [name]). Patient is retired.  Edu: highschool education. Right handed. Exercise 3 times a week. Diet: good water, fruits/vegetables daily Caffeine: None  Relevant past medical, surgical, family and social history reviewed and updated as indicated. Interim medical history since our last visit reviewed. Allergies and medications reviewed and updated. Current Outpatient Prescriptions on File Prior to Visit  Medication Sig  . acetaminophen (TYLENOL) 325 MG tablet Take 650 mg by mouth as needed.  Marland Kitchen aspirin 81 MG tablet Take 81 mg by mouth daily.  . Carboxymethylcellulose Sodium (THERATEARS) 0.25 % SOLN Apply 1 drop to eye as needed.  . fluticasone (FLONASE) 50 MCG/ACT nasal  spray Place 2 sprays into both nostrils daily.  Marland Kitchen lactase (LACTAID) 3000 UNITS tablet Take 1 tablet by mouth as needed.   . lovastatin (MEVACOR) 40 MG tablet TAKE 1 TABLET AT BEDTIME  . Multiple Vitamin (MULTIVITAMIN) tablet Take 1 tablet by mouth daily.  Marland Kitchen nystatin cream (MYCOSTATIN) Apply topically 2 (two) times daily. Continue for 48 hours after rash resolved.  Marland Kitchen omeprazole (PRILOSEC) 20 MG capsule TAKE 1 CAPSULE EVERY DAY  . traMADol (ULTRAM) 50 MG tablet Take 1 tablet (50 mg total) by mouth 2 (two) times daily as needed.  Marland Kitchen XIIDRA 5 % SOLN PLACE 1 DROP TWICE A DAY INTO BOTH EYES  . Camphor-Menthol-Methyl Sal (SALONPAS) 1.2-5.7-6.3 % PTCH Apply 1 patch topically as directed.  . diazepam (VALIUM) 10 MG tablet 1 tablet vaginally daily  . Turmeric Curcumin 500 MG CAPS Take 1 tablet by mouth daily.   No current facility-administered medications on file prior to visit.     Review of Systems  Constitutional: Negative for activity change, appetite change, chills, fatigue, fever and unexpected weight change.  HENT: Negative for hearing loss.   Eyes: Negative for visual disturbance.  Respiratory: Negative for cough, chest tightness, shortness of breath and wheezing.   Cardiovascular: Negative for chest pain, palpitations and leg swelling.  Gastrointestinal: Negative for abdominal distention, abdominal pain, blood in stool, constipation, diarrhea, nausea and vomiting.  Genitourinary: Negative for difficulty urinating and hematuria.  Musculoskeletal: Negative for arthralgias, myalgias and neck pain.  Skin: Negative for rash.  Neurological: Negative for dizziness, seizures, syncope and headaches.       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 Does not bruise/bleed easily.  Psychiatric/Behavioral: Negative for dysphoric mood. The patient is not nervous/anxious.    Per HPI unless specifically indicated in ROS section     Objective:    BP  136/74   Pulse 72   Temp 97.8 F (36.6 C) (Oral)   Ht 5\' 3"  (1.6 m)   Wt 170 lb 4 oz (77.2 kg)   BMI 30.16 kg/m   Wt Readings from Last 3 Encounters:  05/03/16 170 lb 4 oz (77.2 kg)  01/22/16 169 lb 12 oz (77 kg)  12/04/15 168 lb 8 oz (76.4 kg)    Physical Exam  Constitutional: She is oriented to person, place, and time. She appears well-developed and well-nourished. No distress.  HENT:  Head: Normocephalic and atraumatic.  Right Ear: Hearing, tympanic membrane, external ear and ear canal normal.  Left Ear: Hearing, tympanic membrane, external ear and ear canal normal.  Nose: Nose normal.  Mouth/Throat: Uvula is midline, oropharynx is clear and moist and mucous membranes are normal. No oropharyngeal exudate, posterior oropharyngeal edema or posterior oropharyngeal erythema.  Eyes: Conjunctivae and EOM are normal. Pupils are equal, round, and reactive to light. No scleral icterus.  Neck: Normal range of motion. Neck supple. Carotid bruit is not present. No thyromegaly present.  Cardiovascular: Normal rate, regular rhythm, normal heart sounds and intact distal pulses.   No murmur heard. Pulses:      Radial pulses are 2+ on the right side, and 2+ on the left side.  Pulmonary/Chest: Effort normal and breath sounds normal. No respiratory distress. She has no wheezes. She has no rales.  Abdominal: Soft. Bowel sounds are normal. She exhibits no distension and no mass. There is no tenderness. There is no rebound and no guarding.  Musculoskeletal: Normal range of motion. She exhibits no edema.  Lymphadenopathy:    She has no cervical adenopathy.  Neurological: She is alert and oriented to person, place, and time.  CN grossly intact, station and gait intact Recall 3/3  Calculation 5/5 serial 3s  Skin: Skin is warm and dry. No rash noted.  Psychiatric: She has a normal mood and affect. Her behavior is normal. Judgment and thought content normal.  Nursing note and vitals  reviewed.  Results for orders placed or performed in visit on 04/28/16  Lipid panel  Result Value Ref Range   Cholesterol 191 0 - 200 mg/dL   Triglycerides 135.0 0.0 - 149.0 mg/dL   HDL 47.70 >39.00 mg/dL   VLDL 27.0 0.0 - 40.0 mg/dL   LDL Cholesterol 117 (H) 0 - 99 mg/dL   Total CHOL/HDL Ratio 4    NonHDL 123456   Basic metabolic panel  Result Value Ref Range   Sodium 143 135 - 145 mEq/L   Potassium 4.1 3.5 - 5.1 mEq/L   Chloride 105 96 - 112 mEq/L   CO2 33 (H) 19 - 32 mEq/L   Glucose, Bld 116 (H) 70 - 99 mg/dL   BUN 17 6 - 23 mg/dL   Creatinine, Ser 0.73 0.40 - 1.20 mg/dL   Calcium 9.4 8.4 - 10.5 mg/dL   GFR 83.95 >60.00 mL/min  Hemoglobin A1c  Result Value Ref Range   Hgb A1c MFr Bld 6.2 4.6 - 6.5 %      Assessment & Plan:   Problem  List Items Addressed This Visit    Advanced care planning/counseling discussion    Advanced directive discussion - HCPOA is husband. Has living will. Full code. Asked to bring [place] copy.       Cervical pain (neck)    Improved with PT.       GERD (gastroesophageal reflux disease)    Chronic, stable on omeprazole 20mg  QD. Consider decreased dose next visit.       Hereditary and idiopathic peripheral neuropathy    Carries this diagnosis vs CTS.       HLD (hyperlipidemia)    Chronic, stable on lovastatin 40mg  daily.       Relevant Medications   furosemide (LASIX) 20 MG tablet   Medicare annual wellness visit, subsequent - Primary    I have personally reviewed the Medicare Annual Wellness questionnaire and have noted 1. The patient's medical and social history 2. Their use of alcohol, tobacco or illicit drugs 3. Their current medications and supplements 4. The patient's functional ability including ADL's, fall risks, home safety risks and hearing or visual impairment. Cognitive function has been assessed and addressed as indicated.  5. Diet and physical activity 6. Evidence for depression or mood disorders The patients weight,  height, BMI have been recorded in the chart. I have made referrals, counseling and provided education to the patient based on review of the above and I have provided the pt with a written personalized care plan for preventive services. Provider list updated.. See scanned questionairre as needed for further documentation. Reviewed preventative protocols and updated unless pt declined.       PFD (pelvic floor dysfunction)    Completed PFPT. Doing well overall. Has valium vaginally to use PRN.       Prediabetes    Chronic, stable. Discussed avoiding added sugars.       Routine general medical examination at a health care facility    Preventative protocols reviewed and updated unless pt declined. Discussed healthy diet and lifestyle.           Follow up plan: Return in about 6 months (around 11/01/2016) for follow up visit.  [name], MD

## 2016-05-03 NOTE — Patient Instructions (Addendum)
Pneumovax today.  Check on content of b12 in multivitamin - I recommend at least 544mg.  You are doing well today Return as needed or in 6 months for follow up visit.   Health Maintenance, Female Introduction Adopting a healthy lifestyle and getting preventive care can go a long way to promote health and wellness. Talk with your health care provider about what schedule of regular examinations is right for you. This is a good chance for you to check in with your provider about disease prevention and staying healthy. In between checkups, there are plenty of things you can do on your own. Experts have done a lot of research about which lifestyle changes and preventive measures are most likely to keep you healthy. Ask your health care provider for more information. Weight and diet Eat a healthy diet  Be sure to include plenty of vegetables, fruits, low-fat dairy products, and lean protein.  Do not eat a lot of foods high in solid fats, added sugars, or salt.  Get regular exercise. This is one of the most important things you can do for your health.  Most adults should exercise for at least 150 minutes each week. The exercise should increase your heart rate and make you sweat (moderate-intensity exercise).  Most adults should also do strengthening exercises at least twice a week. This is in addition to the moderate-intensity exercise. Maintain a healthy weight  Body mass index (BMI) is a measurement that can be used to identify possible weight problems. It estimates body fat based on height and weight. Your health care provider can help determine your BMI and help you achieve or maintain a healthy weight.  For females 220years of age and older:  A BMI below 18.5 is considered underweight.  A BMI of 18.5 to 24.9 is normal.  A BMI of 25 to 29.9 is considered overweight.  A BMI of 30 and above is considered obese. Watch levels of cholesterol and blood lipids  You should start having your  blood tested for lipids and cholesterol at 69years of age, then have this test every 5 years.  You may need to have your cholesterol levels checked more often if:  Your lipid or cholesterol levels are high.  You are older than 69years of age.  You are at high risk for heart disease. Cancer screening Lung Cancer  Lung cancer screening is recommended for adults 518811years old who are at high risk for lung cancer because of a history of smoking.  A yearly low-dose CT scan of the lungs is recommended for people who:  Currently smoke.  Have quit within the past 15 years.  Have at least a 30-pack-year history of smoking. A pack year is smoking an average of one pack of cigarettes a day for 1 year.  Yearly screening should continue until it has been 15 years since you quit.  Yearly screening should stop if you develop a health problem that would prevent you from having lung cancer treatment. Breast Cancer  Practice breast self-awareness. This means understanding how your breasts normally appear and feel.  It also means doing regular breast self-exams. Let your health care provider know about any changes, no matter how small.  If you are in your 20s or 30s, you should have a clinical breast exam (CBE) by a health care provider every 1-3 years as part of a regular health exam.  If you are 462or older, have a CBE every year. Also consider having  a breast X-ray (mammogram) every year.  If you have a family history of breast cancer, talk to your health care provider about genetic screening.  If you are at high risk for breast cancer, talk to your health care provider about having an MRI and a mammogram every year.  Breast cancer gene (BRCA) assessment is recommended for women who have family members with BRCA-related cancers. BRCA-related cancers include:  Breast.  Ovarian.  Tubal.  Peritoneal cancers.  Results of the assessment will determine the need for genetic counseling  and BRCA1 and BRCA2 testing. Cervical Cancer  Your health care provider may recommend that you be screened regularly for cancer of the pelvic organs (ovaries, uterus, and vagina). This screening involves a pelvic examination, including checking for microscopic changes to the surface of your cervix (Pap test). You may be encouraged to have this screening done every 3 years, beginning at age 77.  For women ages 7-65, health care providers may recommend pelvic exams and Pap testing every 3 years, or they may recommend the Pap and pelvic exam, combined with testing for human papilloma virus (HPV), every 5 years. Some types of HPV increase your risk of cervical cancer. Testing for HPV may also be done on women of any age with unclear Pap test results.  Other health care providers may not recommend any screening for nonpregnant women who are considered low risk for pelvic cancer and who do not have symptoms. Ask your health care provider if a screening pelvic exam is right for you.  If you have had past treatment for cervical cancer or a condition that could lead to cancer, you need Pap tests and screening for cancer for at least 20 years after your treatment. If Pap tests have been discontinued, your risk factors (such as having a new sexual partner) need to be reassessed to determine if screening should resume. Some women have medical problems that increase the chance of getting cervical cancer. In these cases, your health care provider may recommend more frequent screening and Pap tests. Colorectal Cancer  This type of cancer can be detected and often prevented.  Routine colorectal cancer screening usually begins at 69 years of age and continues through 69 years of age.  Your health care provider may recommend screening at an earlier age if you have risk factors for colon cancer.  Your health care provider may also recommend using home test kits to check for hidden blood in the stool.  A small  camera at the end of a tube can be used to examine your colon directly (sigmoidoscopy or colonoscopy). This is done to check for the earliest forms of colorectal cancer.  Routine screening usually begins at age 4.  Direct examination of the colon should be repeated every 5-10 years through 69 years of age. However, you may need to be screened more often if early forms of precancerous polyps or small growths are found. Skin Cancer  Check your skin from head to toe regularly.  Tell your health care provider about any new moles or changes in moles, especially if there is a change in a mole's shape or color.  Also tell your health care provider if you have a mole that is larger than the size of a pencil eraser.  Always use sunscreen. Apply sunscreen liberally and repeatedly throughout the day.  Protect yourself by wearing long sleeves, pants, a wide-brimmed hat, and sunglasses whenever you are outside. Heart disease, diabetes, and high blood pressure  High blood pressure  causes heart disease and increases the risk of stroke. High blood pressure is more likely to develop in:  People who have blood pressure in the high end of the normal range (130-139/85-89 mm Hg).  People who are overweight or obese.  People who are African American.  If you are 23-25 years of age, have your blood pressure checked every 3-5 years. If you are 58 years of age or older, have your blood pressure checked every year. You should have your blood pressure measured twice-once when you are at a hospital or clinic, and once when you are not at a hospital or clinic. Record the average of the two measurements. To check your blood pressure when you are not at a hospital or clinic, you can use:  An automated blood pressure machine at a pharmacy.  A home blood pressure monitor.  If you are between 46 years and 46 years old, ask your health care provider if you should take aspirin to prevent strokes.  Have regular  diabetes screenings. This involves taking a blood sample to check your fasting blood sugar level.  If you are at a normal weight and have a low risk for diabetes, have this test once every three years after 69 years of age.  If you are overweight and have a high risk for diabetes, consider being tested at a younger age or more often. Preventing infection Hepatitis B  If you have a higher risk for hepatitis B, you should be screened for this virus. You are considered at high risk for hepatitis B if:  You were born in a country where hepatitis B is common. Ask your health care provider which countries are considered high risk.  Your parents were born in a high-risk country, and you have not been immunized against hepatitis B (hepatitis B vaccine).  You have HIV or AIDS.  You use needles to inject street drugs.  You live with someone who has hepatitis B.  You have had sex with someone who has hepatitis B.  You get hemodialysis treatment.  You take certain medicines for conditions, including cancer, organ transplantation, and autoimmune conditions. Hepatitis C  Blood testing is recommended for:  Everyone born from [birth year] through 1964.  Anyone with known risk factors for hepatitis C. Sexually transmitted infections (STIs)  You should be screened for sexually transmitted infections (STIs) including gonorrhea and chlamydia if:  You are sexually active and are younger than 69 years of age.  You are older than 69 years of age and your health care provider tells you that you are at risk for this type of infection.  Your sexual activity has changed since you were last screened and you are at an increased risk for chlamydia or gonorrhea. Ask your health care provider if you are at risk.  If you do not have HIV, but are at risk, it may be recommended that you take a prescription medicine daily to prevent HIV infection. This is called pre-exposure prophylaxis (PrEP). You are considered at  risk if:  You are sexually active and do not regularly use condoms or know the HIV status of your partner(s).  You take drugs by injection.  You are sexually active with a partner who has HIV. Talk with your health care provider about whether you are at high risk of being infected with HIV. If you choose to begin PrEP, you should first be tested for HIV. You should then be tested every 3 months for as long as you are  taking PrEP. Pregnancy  If you are premenopausal and you may become pregnant, ask your health care provider about preconception counseling.  If you may become pregnant, take 400 to 800 micrograms (mcg) of folic acid every day.  If you want to prevent pregnancy, talk to your health care provider about birth control (contraception). Osteoporosis and menopause  Osteoporosis is a disease in which the bones lose minerals and strength with aging. This can result in serious bone fractures. Your risk for osteoporosis can be identified using a bone density scan.  If you are 8 years of age or older, or if you are at risk for osteoporosis and fractures, ask your health care provider if you should be screened.  Ask your health care provider whether you should take a calcium or vitamin D supplement to lower your risk for osteoporosis.  Menopause may have certain physical symptoms and risks.  Hormone replacement therapy may reduce some of these symptoms and risks. Talk to your health care provider about whether hormone replacement therapy is right for you. Follow these instructions at home:  Schedule regular health, dental, and eye exams.  Stay current with your immunizations.  Do not use any tobacco products including cigarettes, chewing tobacco, or electronic cigarettes.  If you are pregnant, do not drink alcohol.  If you are breastfeeding, limit how much and how often you drink alcohol.  Limit alcohol intake to no more than 1 drink per day for nonpregnant women. One drink  equals 12 ounces of beer, 5 ounces of wine, or 1 ounces of hard liquor.  Do not use street drugs.  Do not share needles.  Ask your health care provider for help if you need support or information about quitting drugs.  Tell your health care provider if you often feel depressed.  Tell your health care provider if you have ever been abused or do not feel safe at home. This information is not intended to replace advice given to you by your health care provider. Make sure you discuss any questions you have with your health care provider. Document Released: 11/08/2010 Document Revised: 10/01/2015 Document Reviewed: 01/27/2015  2017 Elsevier

## 2016-05-03 NOTE — Telephone Encounter (Signed)
Accidentally sent in Rx to [facility]. Called and cancelled  Sent in updated Rx to [facility]

## 2016-05-03 NOTE — Telephone Encounter (Signed)
Anna at [employer] left v/m requesting cb with clarification of furosemide 20 mg tab; need frequency of how often pt can take med.

## 2016-05-03 NOTE — Progress Notes (Signed)
Pre visit review using our clinic review tool, if applicable. No additional management support is needed unless otherwise documented below in the visit note. 

## 2016-05-03 NOTE — Assessment & Plan Note (Signed)
Chronic, stable on lovastatin 40mg  daily.

## 2016-05-03 NOTE — Assessment & Plan Note (Signed)
Chronic, stable on omeprazole 20mg  QD. Consider decreased dose next visit.

## 2016-05-03 NOTE — Assessment & Plan Note (Signed)
Chronic, stable. Discussed avoiding added sugars.  

## 2016-05-04 ENCOUNTER — Encounter: Payer: Self-pay | Admitting: *Deleted

## 2016-05-19 DIAGNOSIS — J111 Influenza due to unidentified influenza virus with other respiratory manifestations: Secondary | ICD-10-CM | POA: Diagnosis not present

## 2016-05-19 DIAGNOSIS — R0989 Other specified symptoms and signs involving the circulatory and respiratory systems: Secondary | ICD-10-CM | POA: Diagnosis not present

## 2016-05-19 DIAGNOSIS — B349 Viral infection, unspecified: Secondary | ICD-10-CM | POA: Diagnosis not present

## 2016-05-19 DIAGNOSIS — Z888 Allergy status to other drugs, medicaments and biological substances status: Secondary | ICD-10-CM | POA: Diagnosis not present

## 2016-05-19 DIAGNOSIS — R51 Headache: Secondary | ICD-10-CM | POA: Diagnosis not present

## 2016-05-19 DIAGNOSIS — Z881 Allergy status to other antibiotic agents status: Secondary | ICD-10-CM | POA: Diagnosis not present

## 2016-05-19 DIAGNOSIS — J09X2 Influenza due to identified novel influenza A virus with other respiratory manifestations: Secondary | ICD-10-CM | POA: Diagnosis not present

## 2016-05-19 DIAGNOSIS — M791 Myalgia: Secondary | ICD-10-CM | POA: Diagnosis not present

## 2016-05-19 DIAGNOSIS — R05 Cough: Secondary | ICD-10-CM | POA: Diagnosis not present

## 2016-07-15 ENCOUNTER — Telehealth: Payer: Self-pay

## 2016-07-15 NOTE — Telephone Encounter (Signed)
Pt left note at front desk that she has been having elevated BS on and off for 2 months but worse the last week. Last week FBS average 130 - 140. Pt not taking medication but watching diet. Pt is leaving for [place] on 07/21/16 for one month and request cb what should do to improve FBS. [facility].

## 2016-07-17 NOTE — Telephone Encounter (Signed)
Eat as close to a diabetic diet as possible - low sugar/ low refined carbohydrates and exercise 30 or more minutes per day  Dr Darnell Level may or may not want to start medication when he returns  Those are not alarming blood glucose levels but they are higher than normal  I will cc for pcp for when he returns

## 2016-07-17 NOTE — Telephone Encounter (Signed)
Lab Results  Component Value Date   HGBA1C 6.2 04/28/2016   agree with this. How long will she be in [place]?  Would recommend rpt A1c when she returns, continue checking fasting sugars a few times a week and let us know if persistently >130 fasting and we would consider starting low dose metformin.

## 2016-07-18 NOTE — Telephone Encounter (Signed)
Patient notified and verbalized understanding. 

## 2016-08-09 ENCOUNTER — Ambulatory Visit: Payer: Commercial Managed Care - HMO | Admitting: Family Medicine

## 2016-08-09 ENCOUNTER — Encounter: Payer: Self-pay | Admitting: Family Medicine

## 2016-08-09 ENCOUNTER — Ambulatory Visit (INDEPENDENT_AMBULATORY_CARE_PROVIDER_SITE_OTHER): Payer: Medicare HMO | Admitting: Family Medicine

## 2016-08-09 VITALS — BP 130/86 | HR 82 | Temp 97.8°F | Wt 170.5 lb

## 2016-08-09 DIAGNOSIS — J019 Acute sinusitis, unspecified: Secondary | ICD-10-CM | POA: Insufficient documentation

## 2016-08-09 MED ORDER — GUAIFENESIN-CODEINE 100-10 MG/5ML PO SYRP: 5.0000 mL | ORAL_SOLUTION | Freq: Two times a day (BID) | ORAL | 0 refills | Status: DC | PRN

## 2016-08-09 MED ORDER — AZITHROMYCIN 250 MG PO TABS: ORAL_TABLET | ORAL | 0 refills | Status: DC

## 2016-08-09 NOTE — Patient Instructions (Signed)
I think you have sinusitis with some bronchitis components.  May take codeine cough syrup at night time. Take zpack antibiotic.  Push fluids and rest. Continue mucinex with plenty of water to help mobilize mucous out.

## 2016-08-09 NOTE — Progress Notes (Signed)
BP 130/86 (BP Location: Left Arm, Patient Position: Sitting, Cuff Size: Normal)   Pulse 82   Temp 97.8 F (36.6 C) (Oral)   Wt 170 lb 8 oz (77.3 kg)   SpO2 98%   BMI 30.20 kg/m    CC: cough, congestion Subjective:    Patient ID: Dana [name], female    DOB: 02/10/1946, 71 y.o.   MRN: [id number]  HPI: Dana [name] is a 71 y.o. female presenting on 08/09/2016 for Cough and Nasal Congestion   Pt has had 2 wk h/o nasal congestion, deep cough productive of yellow sputum, sharp chest pain from cough. Not improving at all. Coughing fits. Some dyspnea and wheezing from cough along with head congestion. Sinus pressure headache.   No fevers/chills, ear or tooth pain, ST, PNdrainage.  Taking mucinex, dayquil and nyquil.  She did have flu earlier this year.   Recent trip to [facility] - husband sick in hospital with human metapneumovirus.   Relevant past medical, surgical, family and social history reviewed and updated as indicated. Interim medical history since our last visit reviewed. Allergies and medications reviewed and updated. Outpatient Medications Prior to Visit  Medication Sig Dispense Refill  . acetaminophen (TYLENOL) 325 MG tablet Take 650 mg by mouth as needed.    Marland Kitchen aspirin 81 MG tablet Take 81 mg by mouth daily.    . Camphor-Menthol-Methyl Sal (SALONPAS) 1.2-5.7-6.3 % PTCH Apply 1 patch topically as directed.    . Carboxymethylcellulose Sodium (THERATEARS) 0.25 % SOLN Apply 1 drop to eye as needed.    . diazepam (VALIUM) 10 MG tablet 1 tablet vaginally daily    . fluticasone (FLONASE) 50 MCG/ACT nasal spray Place 2 sprays into both nostrils daily. 48 g 3  . furosemide (LASIX) 20 MG tablet Take 1 tablet (20 mg total) by mouth daily as needed. 30 tablet 3  . lactase (LACTAID) 3000 UNITS tablet Take 1 tablet by mouth as needed.     . lovastatin (MEVACOR) 40 MG tablet TAKE 1 TABLET AT BEDTIME 90 tablet 3  . Multiple Vitamin (MULTIVITAMIN) tablet Take 1 tablet by mouth daily.    Marland Kitchen  nystatin cream (MYCOSTATIN) Apply topically 2 (two) times daily. Continue for 48 hours after rash resolved. 30 g 0  . omeprazole (PRILOSEC) 20 MG capsule TAKE 1 CAPSULE EVERY DAY 90 capsule 3  . traMADol (ULTRAM) 50 MG tablet Take 1 tablet (50 mg total) by mouth 2 (two) times daily as needed. 30 tablet 0  . Turmeric Curcumin 500 MG CAPS Take 1 tablet by mouth daily.    Marland Kitchen XIIDRA 5 % SOLN PLACE 1 DROP TWICE A DAY INTO BOTH EYES  5   No facility-administered medications prior to visit.      Per HPI unless specifically indicated in ROS section below Review of Systems     Objective:    BP 130/86 (BP Location: Left Arm, Patient Position: Sitting, Cuff Size: Normal)   Pulse 82   Temp 97.8 F (36.6 C) (Oral)   Wt 170 lb 8 oz (77.3 kg)   SpO2 98%   BMI 30.20 kg/m   Wt Readings from Last 3 Encounters:  08/09/16 170 lb 8 oz (77.3 kg)  05/03/16 170 lb 4 oz (77.2 kg)  01/22/16 169 lb 12 oz (77 kg)    Physical Exam  Constitutional: She appears well-developed and well-nourished. No distress.  HENT:  Head: Normocephalic and atraumatic.  Right Ear: Hearing, tympanic membrane, external ear and ear canal normal.  Left Ear: Hearing, tympanic membrane, external ear and ear canal normal.  Nose: Mucosal edema present. No rhinorrhea. Right sinus exhibits no maxillary sinus tenderness and no frontal sinus tenderness. Left sinus exhibits no maxillary sinus tenderness and no frontal sinus tenderness.  Mouth/Throat: Uvula is midline, oropharynx is clear and moist and mucous membranes are normal. No oropharyngeal exudate, posterior oropharyngeal edema, posterior oropharyngeal erythema or tonsillar abscesses.  Eyes: Conjunctivae and EOM are normal. Pupils are equal, round, and reactive to light. No scleral icterus.  Neck: Normal range of motion. Neck supple.  Cardiovascular: Normal rate, regular rhythm, normal heart sounds and intact distal pulses.   No murmur heard. Pulmonary/Chest: Effort normal and  breath sounds normal. No respiratory distress. She has no wheezes. She has no rales.  Coughing fits present Lungs clear  Lymphadenopathy:    She has no cervical adenopathy.  Skin: Skin is warm and dry. No rash noted.  Nursing note and vitals reviewed.     Assessment & Plan:   Problem List Items Addressed This Visit    Acute sinusitis - Primary    With bronchitis component. Given duration and failure to improve, will treat with zpack antibiotic. cheratussin for cough. Further supportive care reviewed.       Relevant Medications   guaiFENesin-codeine (CHERATUSSIN AC) 100-10 MG/5ML syrup   azithromycin (ZITHROMAX) 250 MG tablet       Follow up plan: Return if symptoms worsen or fail to improve.  [name], MD

## 2016-08-09 NOTE — Progress Notes (Signed)
Pre visit review using our clinic review tool, if applicable. No additional management support is needed unless otherwise documented below in the visit note. 

## 2016-08-09 NOTE — Assessment & Plan Note (Signed)
With bronchitis component. Given duration and failure to improve, will treat with zpack antibiotic. cheratussin for cough. Further supportive care reviewed.

## 2016-09-30 ENCOUNTER — Ambulatory Visit (INDEPENDENT_AMBULATORY_CARE_PROVIDER_SITE_OTHER): Payer: Medicare HMO | Admitting: Family Medicine

## 2016-09-30 ENCOUNTER — Ambulatory Visit
Admission: RE | Admit: 2016-09-30 | Discharge: 2016-09-30 | Disposition: A | Discharge status: Home / Self Care | Payer: Medicare HMO | Source: Ambulatory Visit | Attending: Family Medicine | Admitting: Family Medicine

## 2016-09-30 ENCOUNTER — Ambulatory Visit (INDEPENDENT_AMBULATORY_CARE_PROVIDER_SITE_OTHER)
Admission: RE | Admit: 2016-09-30 | Discharge: 2016-09-30 | Disposition: A | Discharge status: Home / Self Care | Payer: Medicare HMO | Source: Ambulatory Visit | Attending: Family Medicine | Admitting: Family Medicine

## 2016-09-30 ENCOUNTER — Encounter: Payer: Self-pay | Admitting: Family Medicine

## 2016-09-30 VITALS — BP 132/70 | HR 68 | Temp 97.6°F | Wt 174.2 lb

## 2016-09-30 DIAGNOSIS — R202 Paresthesia of skin: Secondary | ICD-10-CM

## 2016-09-30 DIAGNOSIS — M79602 Pain in left arm: Secondary | ICD-10-CM | POA: Diagnosis not present

## 2016-09-30 DIAGNOSIS — M79622 Pain in left upper arm: Secondary | ICD-10-CM | POA: Diagnosis not present

## 2016-09-30 DIAGNOSIS — M25512 Pain in left shoulder: Secondary | ICD-10-CM | POA: Insufficient documentation

## 2016-09-30 NOTE — Assessment & Plan Note (Signed)
Tender to palpation at left humerus. Check xrays to r/o fracture or other bone cause of symptoms. Not consistent with RTC tendinopathy. ?shoulder arthritis - check xrays to eval arthritic burden. Start aleve BID for next 1 wk, provided with stretching exercises from [facility] pt advisor.

## 2016-09-30 NOTE — Patient Instructions (Addendum)
Xray of left shoulder Start aleve 1 tablet over the counter twice daily with meals  Start using wrist brace for sleep at night time.  Try exercises for shoulder provided today. If not better, let me know for hand surgery referral.

## 2016-09-30 NOTE — Assessment & Plan Note (Signed)
Anticipate CTS. rec start aleve 1 tab BID with meals, wrist brace at night time. If no improvement with this, pt will notify us for hand surgery referral.

## 2016-09-30 NOTE — Progress Notes (Signed)
BP 132/70   Pulse 68   Temp 97.6 F (36.4 C) (Oral)   Wt 174 lb 4 oz (79 kg)   SpO2 96%   BMI 30.87 kg/m    CC: several issues Subjective:    Patient ID: Dana [name], female    DOB: [birth year], 71 y.o.   MRN: [id number]  HPI: Hena Ewalt Angelucci is a 71 y.o. female presenting on 09/30/2016 for Arm Pain (left) and Wrist Pain (right)   Longstanding h/o R hand numbness, acutely worse over the past 1 mo - describes paresthesias and numbness of R hand at 2nd and 3rd fingertips, some at thumb. At times wakes her up at night. Some tingling of left hand, but minimal.   Also notes worsening left shoulder soreness over the last 1-2 months - described as bad cramping at upper arm. Denies inciting trauma/injury. No neck pain, no shooting pain down arm.   Hasn't tried anything for this yet other than tylenol.   Relevant past medical, surgical, family and social history reviewed and updated as indicated. Interim medical history since our last visit reviewed. Allergies and medications reviewed and updated. Outpatient Medications Prior to Visit  Medication Sig Dispense Refill  . acetaminophen (TYLENOL) 325 MG tablet Take 650 mg by mouth as needed.    Marland Kitchen aspirin 81 MG tablet Take 81 mg by mouth daily.    . Camphor-Menthol-Methyl Sal (SALONPAS) 1.2-5.7-6.3 % PTCH Apply 1 patch topically as directed.    . Carboxymethylcellulose Sodium (THERATEARS) 0.25 % SOLN Apply 1 drop to eye as needed.    . fluticasone (FLONASE) 50 MCG/ACT nasal spray Place 2 sprays into both nostrils daily. 48 g 3  . furosemide (LASIX) 20 MG tablet Take 1 tablet (20 mg total) by mouth daily as needed. 30 tablet 3  . lactase (LACTAID) 3000 UNITS tablet Take 1 tablet by mouth as needed.     . lovastatin (MEVACOR) 40 MG tablet TAKE 1 TABLET AT BEDTIME 90 tablet 3  . Multiple Vitamin (MULTIVITAMIN) tablet Take 1 tablet by mouth daily.    Marland Kitchen nystatin cream (MYCOSTATIN) Apply topically 2 (two) times daily. Continue for 48 hours after  rash resolved. 30 g 0  . omeprazole (PRILOSEC) 20 MG capsule TAKE 1 CAPSULE EVERY DAY 90 capsule 3  . traMADol (ULTRAM) 50 MG tablet Take 1 tablet (50 mg total) by mouth 2 (two) times daily as needed. 30 tablet 0  . Turmeric Curcumin 500 MG CAPS Take 1 tablet by mouth daily.    Marland Kitchen XIIDRA 5 % SOLN PLACE 1 DROP TWICE A DAY INTO BOTH EYES  5  . azithromycin (ZITHROMAX) 250 MG tablet Take two tablets on day one followed by one tablet on days 2-5 6 each 0  . diazepam (VALIUM) 10 MG tablet 1 tablet vaginally daily    . guaiFENesin-codeine (CHERATUSSIN AC) 100-10 MG/5ML syrup Take 5 mLs by mouth 2 (two) times daily as needed for cough (sedation precautions). 140 mL 0   No facility-administered medications prior to visit.      Per HPI unless specifically indicated in ROS section below Review of Systems     Objective:    BP 132/70   Pulse 68   Temp 97.6 F (36.4 C) (Oral)   Wt 174 lb 4 oz (79 kg)   SpO2 96%   BMI 30.87 kg/m   Wt Readings from Last 3 Encounters:  09/30/16 174 lb 4 oz (79 kg)  08/09/16 170 lb 8 oz (77.3 kg)  05/03/16 170 lb 4 oz (77.2 kg)    Physical Exam  Constitutional: She is oriented to person, place, and time. She appears well-developed and well-nourished. No distress.  Musculoskeletal: She exhibits no edema.  R shoulder WNL L Shoulder exam: No deformity of shoulders on inspection. Point tender to palpation mid humeral shaft on left Limited ROM in abduction and forward flexion past 90 degrees due to pain. No pain or weakness with testing SITS in ext/int rotation. No pain with empty can sign. No impingement. No pain with crossover test. Discomfort with rotation of humeral head in [facility] joint.   R wrist/hand FROM without pain of joint L wrist WNL  Neurological: She is alert and oriented to person, place, and time.  Mildly positive tinel and phalen on right  Skin: Skin is warm and dry. No rash noted.  Nursing note and vitals reviewed.  Results for orders placed  or performed in visit on 05/04/16  HM MAMMOGRAPHY  Result Value Ref Range   HM Mammogram 0-4 Bi-Rad 0-4 Bi-Rad, Self Reported Normal   Lab Results  Component Value Date   VITAMINB12 449 04/29/2014       Assessment & Plan:   Problem List Items Addressed This Visit    Left upper arm pain    Tender to palpation at left humerus. Check xrays to r/o fracture or other bone cause of symptoms. Not consistent with RTC tendinopathy. ?shoulder arthritis - check xrays to eval arthritic burden. Start aleve BID for next 1 wk, provided with stretching exercises from [facility] pt advisor.       Relevant Orders   DG Shoulder Left   DG Humerus Left   Right hand paresthesia - Primary    Anticipate CTS. rec start aleve 1 tab BID with meals, wrist brace at night time. If no improvement with this, pt will notify us for hand surgery referral.           Follow up plan: Return if symptoms worsen or fail to improve.  [name], MD

## 2016-11-03 ENCOUNTER — Ambulatory Visit: Payer: Commercial Managed Care - HMO | Admitting: Family Medicine

## 2016-11-14 ENCOUNTER — Ambulatory Visit (INDEPENDENT_AMBULATORY_CARE_PROVIDER_SITE_OTHER): Payer: Medicare HMO | Admitting: Family Medicine

## 2016-11-14 ENCOUNTER — Encounter: Payer: Self-pay | Admitting: Family Medicine

## 2016-11-14 VITALS — BP 112/84 | HR 74 | Temp 97.6°F | Wt 175.0 lb

## 2016-11-14 DIAGNOSIS — R202 Paresthesia of skin: Secondary | ICD-10-CM

## 2016-11-14 DIAGNOSIS — M25512 Pain in left shoulder: Secondary | ICD-10-CM | POA: Diagnosis not present

## 2016-11-14 DIAGNOSIS — G8929 Other chronic pain: Secondary | ICD-10-CM | POA: Diagnosis not present

## 2016-11-14 NOTE — Assessment & Plan Note (Addendum)
Ongoing for months. 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 Today with evidence of some shoulder bursitis and labral irritation, along with concern for developing adhesive capsulitis. Will refer to ortho for further evaluation. Pt agrees with plan.

## 2016-11-14 NOTE — Patient Instructions (Addendum)
We will refer you to hand surgeon to evaluate right hand pain - see Rosaria Ferries on your way out.  We will refer you to orthopedic for further evaluation of left shoulder - possible developing frozen shoulder. Do pendulum exercises to maintain range of motion.

## 2016-11-14 NOTE — Assessment & Plan Note (Signed)
Ongoing despite NSAID and wrist brace. Pt unsure if she is to point of desiring surgery but would like surgical evaluation to discuss options. Will refer.

## 2016-11-14 NOTE — Progress Notes (Signed)
BP 112/84 (BP Location: Left Arm, Patient Position: Sitting, Cuff Size: Normal)   Pulse 74   Temp 97.6 F (36.4 C) (Oral)   Wt 175 lb (79.4 kg)   SpO2 95%   BMI 31.00 kg/m    CC: 6 mo f/u visit Subjective:    Patient ID: Dana [name], female    DOB: 01/18/1946, 71 y.o.   MRN: [id number]  HPI: Dana [name] is a 71 y.o. female presenting on 11/14/2016 for 6 Month Follow-up (Still having issues with left shoulder pain and carpal tunnel pain.)   See prior note for details. Ongoing L shoulder pain and R hand pain from presumed carpal tunnel syndrome. Wrist brace helps. Shoulder pain thought related to arthritis, treated with aleve 1 wk course. She continues taking PRN. Shoulder pain worse mainly when reaching back or significantly above head.   She would like surgical evaluation for ongoing R hand pain/paresthesia.   She does take B12 1030mcg daily.  Lab Results  Component Value Date   MGNOIBBC48 889 04/29/2014    Relevant past medical, surgical, family and social history reviewed and updated as indicated. Interim medical history since our last visit reviewed. Allergies and medications reviewed and updated. Outpatient Medications Prior to Visit  Medication Sig Dispense Refill  . acetaminophen (TYLENOL) 325 MG tablet Take 650 mg by mouth as needed.    Marland Kitchen aspirin 81 MG tablet Take 81 mg by mouth daily.    . Carboxymethylcellulose Sodium (THERATEARS) 0.25 % SOLN Apply 1 drop to eye as needed.    . fluticasone (FLONASE) 50 MCG/ACT nasal spray Place 2 sprays into both nostrils daily. 48 g 3  . furosemide (LASIX) 20 MG tablet Take 1 tablet (20 mg total) by mouth daily as needed. 30 tablet 3  . lactase (LACTAID) 3000 UNITS tablet Take 1 tablet by mouth as needed.     . lovastatin (MEVACOR) 40 MG tablet TAKE 1 TABLET AT BEDTIME 90 tablet 3  . Multiple Vitamin (MULTIVITAMIN) tablet Take 1 tablet by mouth daily.    Marland Kitchen nystatin cream (MYCOSTATIN) Apply topically 2 (two) times daily. Continue  for 48 hours after rash resolved. 30 g 0  . omeprazole (PRILOSEC) 20 MG capsule TAKE 1 CAPSULE EVERY DAY 90 capsule 3  . traMADol (ULTRAM) 50 MG tablet Take 1 tablet (50 mg total) by mouth 2 (two) times daily as needed. 30 tablet 0  . Turmeric Curcumin 500 MG CAPS Take 1 tablet by mouth daily.    Marland Kitchen XIIDRA 5 % SOLN PLACE 1 DROP TWICE A DAY INTO BOTH EYES  5  . Camphor-Menthol-Methyl Sal (SALONPAS) 1.2-5.7-6.3 % PTCH Apply 1 patch topically as directed.     No facility-administered medications prior to visit.      Per HPI unless specifically indicated in ROS section below Review of Systems     Objective:    BP 112/84 (BP Location: Left Arm, Patient Position: Sitting, Cuff Size: Normal)   Pulse 74   Temp 97.6 F (36.4 C) (Oral)   Wt 175 lb (79.4 kg)   SpO2 95%   BMI 31.00 kg/m   Wt Readings from Last 3 Encounters:  11/14/16 175 lb (79.4 kg)  09/30/16 174 lb 4 oz (79 kg)  08/09/16 170 lb 8 oz (77.3 kg)    Physical Exam  Constitutional: She is oriented to person, place, and time. She appears well-developed and well-nourished. No distress.  Musculoskeletal: She exhibits no edema.  R shoulder WNL L shoulder exam: No  deformity of shoulders on inspection. Tender to palpation posterior left shoulder and some discomfort upper arm Limited both active and passive ROM abduction and forward flexion  No pain or weakness with testing SITS in ext/int rotation  No significant pain with empty can sign. Neg Speed test. No impingement. No pain with crossover test. + pain with rotation of humeral head in [facility] joint.   Neurological: She is alert and oriented to person, place, and time.  Skin: Skin is warm and dry.  Nursing note and vitals reviewed.     Assessment & Plan:   Problem List Items Addressed This Visit    Left shoulder pain - Primary    Ongoing for months. 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 Today with evidence of some shoulder bursitis and labral  irritation, along with concern for developing adhesive capsulitis. Will refer to ortho for further evaluation. Pt agrees with plan.       Relevant Orders   Ambulatory referral to Orthopedic Surgery   Right hand paresthesia    Ongoing despite NSAID and wrist brace. Pt unsure if she is to point of desiring surgery but would like surgical evaluation to discuss options. Will refer.       Relevant Orders   Ambulatory referral to Hand Surgery       Follow up plan: Return in about 6 months (around 05/17/2017), or if symptoms worsen or fail to improve, for annual exam, prior fasting for blood work, medicare wellness visit.  [name], MD

## 2016-11-22 DIAGNOSIS — R200 Anesthesia of skin: Secondary | ICD-10-CM | POA: Diagnosis not present

## 2016-11-25 DIAGNOSIS — M7502 Adhesive capsulitis of left shoulder: Secondary | ICD-10-CM | POA: Diagnosis not present

## 2016-11-28 DIAGNOSIS — M25512 Pain in left shoulder: Secondary | ICD-10-CM | POA: Diagnosis not present

## 2016-11-28 DIAGNOSIS — M25612 Stiffness of left shoulder, not elsewhere classified: Secondary | ICD-10-CM | POA: Diagnosis not present

## 2016-11-28 DIAGNOSIS — M7502 Adhesive capsulitis of left shoulder: Secondary | ICD-10-CM | POA: Diagnosis not present

## 2016-11-30 DIAGNOSIS — M7502 Adhesive capsulitis of left shoulder: Secondary | ICD-10-CM | POA: Diagnosis not present

## 2016-11-30 DIAGNOSIS — M25612 Stiffness of left shoulder, not elsewhere classified: Secondary | ICD-10-CM | POA: Diagnosis not present

## 2016-11-30 DIAGNOSIS — M25512 Pain in left shoulder: Secondary | ICD-10-CM | POA: Diagnosis not present

## 2016-12-02 DIAGNOSIS — M25512 Pain in left shoulder: Secondary | ICD-10-CM | POA: Diagnosis not present

## 2016-12-02 DIAGNOSIS — M25612 Stiffness of left shoulder, not elsewhere classified: Secondary | ICD-10-CM | POA: Diagnosis not present

## 2016-12-02 DIAGNOSIS — M7502 Adhesive capsulitis of left shoulder: Secondary | ICD-10-CM | POA: Diagnosis not present

## 2016-12-07 DIAGNOSIS — M7502 Adhesive capsulitis of left shoulder: Secondary | ICD-10-CM | POA: Diagnosis not present

## 2016-12-07 DIAGNOSIS — M25512 Pain in left shoulder: Secondary | ICD-10-CM | POA: Diagnosis not present

## 2016-12-07 DIAGNOSIS — M25612 Stiffness of left shoulder, not elsewhere classified: Secondary | ICD-10-CM | POA: Diagnosis not present

## 2016-12-09 DIAGNOSIS — M25612 Stiffness of left shoulder, not elsewhere classified: Secondary | ICD-10-CM | POA: Diagnosis not present

## 2016-12-09 DIAGNOSIS — M7502 Adhesive capsulitis of left shoulder: Secondary | ICD-10-CM | POA: Diagnosis not present

## 2016-12-09 DIAGNOSIS — M25512 Pain in left shoulder: Secondary | ICD-10-CM | POA: Diagnosis not present

## 2016-12-12 DIAGNOSIS — M25512 Pain in left shoulder: Secondary | ICD-10-CM | POA: Diagnosis not present

## 2016-12-12 DIAGNOSIS — M25612 Stiffness of left shoulder, not elsewhere classified: Secondary | ICD-10-CM | POA: Diagnosis not present

## 2016-12-12 DIAGNOSIS — M7502 Adhesive capsulitis of left shoulder: Secondary | ICD-10-CM | POA: Diagnosis not present

## 2016-12-14 DIAGNOSIS — M7502 Adhesive capsulitis of left shoulder: Secondary | ICD-10-CM | POA: Diagnosis not present

## 2016-12-14 DIAGNOSIS — M25512 Pain in left shoulder: Secondary | ICD-10-CM | POA: Diagnosis not present

## 2016-12-14 DIAGNOSIS — M25612 Stiffness of left shoulder, not elsewhere classified: Secondary | ICD-10-CM | POA: Diagnosis not present

## 2016-12-16 DIAGNOSIS — M25612 Stiffness of left shoulder, not elsewhere classified: Secondary | ICD-10-CM | POA: Diagnosis not present

## 2016-12-16 DIAGNOSIS — M25512 Pain in left shoulder: Secondary | ICD-10-CM | POA: Diagnosis not present

## 2016-12-16 DIAGNOSIS — M7502 Adhesive capsulitis of left shoulder: Secondary | ICD-10-CM | POA: Diagnosis not present

## 2016-12-19 DIAGNOSIS — M7502 Adhesive capsulitis of left shoulder: Secondary | ICD-10-CM | POA: Diagnosis not present

## 2016-12-19 DIAGNOSIS — M25512 Pain in left shoulder: Secondary | ICD-10-CM | POA: Diagnosis not present

## 2016-12-19 DIAGNOSIS — M25612 Stiffness of left shoulder, not elsewhere classified: Secondary | ICD-10-CM | POA: Diagnosis not present

## 2016-12-21 DIAGNOSIS — G5602 Carpal tunnel syndrome, left upper limb: Secondary | ICD-10-CM | POA: Diagnosis not present

## 2016-12-21 DIAGNOSIS — M25512 Pain in left shoulder: Secondary | ICD-10-CM | POA: Diagnosis not present

## 2016-12-21 DIAGNOSIS — G5601 Carpal tunnel syndrome, right upper limb: Secondary | ICD-10-CM | POA: Diagnosis not present

## 2016-12-21 DIAGNOSIS — M79621 Pain in right upper arm: Secondary | ICD-10-CM | POA: Diagnosis not present

## 2016-12-21 DIAGNOSIS — M25612 Stiffness of left shoulder, not elsewhere classified: Secondary | ICD-10-CM | POA: Diagnosis not present

## 2016-12-21 DIAGNOSIS — M79622 Pain in left upper arm: Secondary | ICD-10-CM | POA: Diagnosis not present

## 2016-12-21 DIAGNOSIS — M7502 Adhesive capsulitis of left shoulder: Secondary | ICD-10-CM | POA: Diagnosis not present

## 2016-12-26 DIAGNOSIS — M7502 Adhesive capsulitis of left shoulder: Secondary | ICD-10-CM | POA: Diagnosis not present

## 2016-12-26 DIAGNOSIS — M25512 Pain in left shoulder: Secondary | ICD-10-CM | POA: Diagnosis not present

## 2016-12-26 DIAGNOSIS — M25612 Stiffness of left shoulder, not elsewhere classified: Secondary | ICD-10-CM | POA: Diagnosis not present

## 2017-01-02 DIAGNOSIS — M25512 Pain in left shoulder: Secondary | ICD-10-CM | POA: Diagnosis not present

## 2017-01-02 DIAGNOSIS — G5601 Carpal tunnel syndrome, right upper limb: Secondary | ICD-10-CM | POA: Diagnosis not present

## 2017-01-02 DIAGNOSIS — G5602 Carpal tunnel syndrome, left upper limb: Secondary | ICD-10-CM | POA: Diagnosis not present

## 2017-01-02 DIAGNOSIS — M79621 Pain in right upper arm: Secondary | ICD-10-CM | POA: Diagnosis not present

## 2017-01-02 DIAGNOSIS — M79622 Pain in left upper arm: Secondary | ICD-10-CM | POA: Diagnosis not present

## 2017-01-02 DIAGNOSIS — M25612 Stiffness of left shoulder, not elsewhere classified: Secondary | ICD-10-CM | POA: Diagnosis not present

## 2017-01-02 DIAGNOSIS — M7502 Adhesive capsulitis of left shoulder: Secondary | ICD-10-CM | POA: Diagnosis not present

## 2017-01-04 DIAGNOSIS — M25612 Stiffness of left shoulder, not elsewhere classified: Secondary | ICD-10-CM | POA: Diagnosis not present

## 2017-01-04 DIAGNOSIS — M25512 Pain in left shoulder: Secondary | ICD-10-CM | POA: Diagnosis not present

## 2017-01-04 DIAGNOSIS — M7502 Adhesive capsulitis of left shoulder: Secondary | ICD-10-CM | POA: Diagnosis not present

## 2017-01-06 DIAGNOSIS — M7502 Adhesive capsulitis of left shoulder: Secondary | ICD-10-CM | POA: Diagnosis not present

## 2017-01-06 DIAGNOSIS — M25612 Stiffness of left shoulder, not elsewhere classified: Secondary | ICD-10-CM | POA: Diagnosis not present

## 2017-01-06 DIAGNOSIS — M25512 Pain in left shoulder: Secondary | ICD-10-CM | POA: Diagnosis not present

## 2017-01-11 DIAGNOSIS — M25512 Pain in left shoulder: Secondary | ICD-10-CM | POA: Diagnosis not present

## 2017-01-11 DIAGNOSIS — M7502 Adhesive capsulitis of left shoulder: Secondary | ICD-10-CM | POA: Diagnosis not present

## 2017-01-11 DIAGNOSIS — M25612 Stiffness of left shoulder, not elsewhere classified: Secondary | ICD-10-CM | POA: Diagnosis not present

## 2017-01-13 DIAGNOSIS — G5601 Carpal tunnel syndrome, right upper limb: Secondary | ICD-10-CM | POA: Diagnosis not present

## 2017-01-23 DIAGNOSIS — M25612 Stiffness of left shoulder, not elsewhere classified: Secondary | ICD-10-CM | POA: Diagnosis not present

## 2017-01-23 DIAGNOSIS — M25512 Pain in left shoulder: Secondary | ICD-10-CM | POA: Diagnosis not present

## 2017-01-23 DIAGNOSIS — M7502 Adhesive capsulitis of left shoulder: Secondary | ICD-10-CM | POA: Diagnosis not present

## 2017-01-25 DIAGNOSIS — M25512 Pain in left shoulder: Secondary | ICD-10-CM | POA: Diagnosis not present

## 2017-01-25 DIAGNOSIS — M7502 Adhesive capsulitis of left shoulder: Secondary | ICD-10-CM | POA: Diagnosis not present

## 2017-01-25 DIAGNOSIS — M25612 Stiffness of left shoulder, not elsewhere classified: Secondary | ICD-10-CM | POA: Diagnosis not present

## 2017-01-30 DIAGNOSIS — M7502 Adhesive capsulitis of left shoulder: Secondary | ICD-10-CM | POA: Diagnosis not present

## 2017-01-30 DIAGNOSIS — M25512 Pain in left shoulder: Secondary | ICD-10-CM | POA: Diagnosis not present

## 2017-01-30 DIAGNOSIS — M25612 Stiffness of left shoulder, not elsewhere classified: Secondary | ICD-10-CM | POA: Diagnosis not present

## 2017-01-31 DIAGNOSIS — G5602 Carpal tunnel syndrome, left upper limb: Secondary | ICD-10-CM | POA: Diagnosis not present

## 2017-02-01 ENCOUNTER — Encounter: Payer: Self-pay | Admitting: Family Medicine

## 2017-02-01 ENCOUNTER — Ambulatory Visit (INDEPENDENT_AMBULATORY_CARE_PROVIDER_SITE_OTHER): Payer: Medicare HMO | Admitting: Family Medicine

## 2017-02-01 VITALS — BP 132/80 | HR 73 | Temp 97.8°F | Wt 179.0 lb

## 2017-02-01 DIAGNOSIS — M25552 Pain in left hip: Secondary | ICD-10-CM | POA: Diagnosis not present

## 2017-02-01 DIAGNOSIS — M25512 Pain in left shoulder: Secondary | ICD-10-CM | POA: Diagnosis not present

## 2017-02-01 DIAGNOSIS — G8929 Other chronic pain: Secondary | ICD-10-CM | POA: Diagnosis not present

## 2017-02-01 DIAGNOSIS — Z23 Encounter for immunization: Secondary | ICD-10-CM

## 2017-02-01 DIAGNOSIS — M7502 Adhesive capsulitis of left shoulder: Secondary | ICD-10-CM | POA: Diagnosis not present

## 2017-02-01 DIAGNOSIS — M5126 Other intervertebral disc displacement, lumbar region: Secondary | ICD-10-CM | POA: Insufficient documentation

## 2017-02-01 DIAGNOSIS — M25612 Stiffness of left shoulder, not elsewhere classified: Secondary | ICD-10-CM | POA: Diagnosis not present

## 2017-02-01 NOTE — Assessment & Plan Note (Signed)
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

## 2017-02-01 NOTE — Assessment & Plan Note (Signed)
Most consistent with L trochanteric bursitis, less likely piriformis syndrome.  Treat ibuprofen 600mg  BID with meals.  Provided with exercises from [facility] pt advisor.  Update if not improving with treatment. consider PT referral.

## 2017-02-01 NOTE — Patient Instructions (Signed)
I think you have hip bursitis and possible sciatica from pinching at the piriformis muscle at the left buttock.  Treat with continued ibuprofen 600mg  twice daily with meals for 5 days then as needed. May continue tylenol. May use heating pad to area (not directly on skin).  Do exercises provided today. Let us know if not improving with treatment.

## 2017-02-01 NOTE — Progress Notes (Signed)
BP 132/80 (BP Location: Left Arm, Patient Position: Sitting, Cuff Size: Normal)   Pulse 73   Temp 97.8 F (36.6 C) (Oral)   Wt 179 lb (81.2 kg)   SpO2 97%   BMI 31.71 kg/m    CC: hip pain, leg pain of left side Subjective:    Patient ID: Dana [name], female    DOB: December 18, 1945, 71 y.o.   MRN: [id number]  HPI: Dana [name] is a 71 y.o. female presenting on 02/01/2017 for Hip Pain (Left hip pain, intermittent. Hurts more to sit. Started 01/28/17. Has tried Tylenol and ibuprofen, helps) and Leg Pain   4d h/o L hip and leg pain. Pain starts L lower back, radiates down posterior buttock to lateral hip into thigh. Stops at mid thigh. Denies inciting trauma/injury. She has been favoring left side after recent R CTS release. Tried tylenol and ibuprofen which has helped.   Denies fevers/chills, bowel/bladder incontinence, or leg numbness/weakness.   Relevant past medical, surgical, family and social history reviewed and updated as indicated. Interim medical history since our last visit reviewed. Allergies and medications reviewed and updated. Outpatient Medications Prior to Visit  Medication Sig Dispense Refill  . acetaminophen (TYLENOL) 325 MG tablet Take 650 mg by mouth as needed.    Marland Kitchen aspirin 81 MG tablet Take 81 mg by mouth daily.    . Carboxymethylcellulose Sodium (THERATEARS) 0.25 % SOLN Apply 1 drop to eye as needed.    . fluticasone (FLONASE) 50 MCG/ACT nasal spray Place 2 sprays into both nostrils daily. 48 g 3  . furosemide (LASIX) 20 MG tablet Take 1 tablet (20 mg total) by mouth daily as needed. 30 tablet 3  . lactase (LACTAID) 3000 UNITS tablet Take 1 tablet by mouth as needed.     . lovastatin (MEVACOR) 40 MG tablet TAKE 1 TABLET AT BEDTIME 90 tablet 3  . Multiple Vitamin (MULTIVITAMIN) tablet Take 1 tablet by mouth daily.    Marland Kitchen nystatin cream (MYCOSTATIN) Apply topically 2 (two) times daily. Continue for 48 hours after rash resolved. 30 g 0  . omeprazole (PRILOSEC) 20 MG  capsule TAKE 1 CAPSULE EVERY DAY 90 capsule 3  . traMADol (ULTRAM) 50 MG tablet Take 1 tablet (50 mg total) by mouth 2 (two) times daily as needed. 30 tablet 0  . Turmeric Curcumin 500 MG CAPS Take 1 tablet by mouth daily.    . vitamin B-12 (CYANOCOBALAMIN) 1000 MCG tablet Take 1,000 mcg by mouth daily.    Marland Kitchen XIIDRA 5 % SOLN PLACE 1 DROP TWICE A DAY INTO BOTH EYES  5   No facility-administered medications prior to visit.      Per HPI unless specifically indicated in ROS section below Review of Systems     Objective:    BP 132/80 (BP Location: Left Arm, Patient Position: Sitting, Cuff Size: Normal)   Pulse 73   Temp 97.8 F (36.6 C) (Oral)   Wt 179 lb (81.2 kg)   SpO2 97%   BMI 31.71 kg/m   Wt Readings from Last 3 Encounters:  02/01/17 179 lb (81.2 kg)  11/14/16 175 lb (79.4 kg)  09/30/16 174 lb 4 oz (79 kg)    Physical Exam  Constitutional: She is oriented to person, place, and time. She appears well-developed and well-nourished. No distress.  Musculoskeletal: She exhibits no edema.  No pain midline spine Mild L lower lumbar paraspinous mm tenderness Neg SLR bilaterally No pain with int/ext rotation at hip Neg FABER No pain  at SIJ bilaterally.  Tender to palpation L sciatic notch and trochanteric bursitis  Neurological: She is alert and oriented to person, place, and time.  5/5 strength BLE  Skin: Skin is warm and dry. No rash noted.  Nursing note and vitals reviewed.  Lab Results  Component Value Date   CREATININE 0.73 04/28/2016       Assessment & Plan:   Problem List Items Addressed This Visit    Acute hip pain, left    Most consistent with L trochanteric bursitis, less likely piriformis syndrome.  Treat ibuprofen 600mg  BID with meals.  Provided with exercises from [facility] pt advisor.  Update if not improving with treatment. consider PT referral.       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           Follow up  plan: Return if symptoms worsen or fail to improve.  [name], MD

## 2017-02-02 NOTE — Addendum Note (Signed)
Addended by: Brenton Grills on: 1/94/1740 81:44 PM   Modules accepted: Orders

## 2017-02-06 HISTORY — PX: CARPAL TUNNEL RELEASE: SHX101

## 2017-02-14 ENCOUNTER — Encounter: Payer: Self-pay | Admitting: Family Medicine

## 2017-02-26 DIAGNOSIS — R05 Cough: Secondary | ICD-10-CM | POA: Diagnosis not present

## 2017-02-26 DIAGNOSIS — J3489 Other specified disorders of nose and nasal sinuses: Secondary | ICD-10-CM | POA: Diagnosis not present

## 2017-02-26 DIAGNOSIS — Z888 Allergy status to other drugs, medicaments and biological substances status: Secondary | ICD-10-CM | POA: Diagnosis not present

## 2017-02-26 DIAGNOSIS — Z882 Allergy status to sulfonamides status: Secondary | ICD-10-CM | POA: Diagnosis not present

## 2017-03-07 ENCOUNTER — Ambulatory Visit: Payer: Self-pay | Admitting: *Deleted

## 2017-03-07 NOTE — Telephone Encounter (Signed)
She was dx with bursitis in her left hip.   She is taking Ibuprofen which helps but when it wears off she is in a lot of pain again.   The pain is now in her left knee and ankle.  She is in [place] now then heading to [place].   They may cut their trip short if she can't get some relief from the pain.  She tried the Tramadol but it makes me  "feel loopy".  I'm sending a high priority note to Dr. Bosie Clos office for further instruction.   I informed pt they would be in contact with her. Reason for Disposition . [1] MODERATE pain (e.g., interferes with normal activities, limping) AND [2] present > 3 days  Answer Assessment - Initial Assessment Questions 1. LOCATION and RADIATION: "Where is the pain located?"      L hip, back, left knee and ankle. 2. QUALITY: "What does the pain feel like?"  (e.g., sharp, dull, aching, burning)     Aching 3. SEVERITY: "How bad is the pain?" "What does it keep you from doing?"   (Scale 1-10; or mild, moderate, severe)   -  MILD (1-3): doesn't interfere with normal activities    -  MODERATE (4-7): interferes with normal activities (e.g., work or school) or awakens from sleep, limping    -  SEVERE (8-10): excruciating pain, unable to do any normal activities, unable to walk     Severe.   It woke me me up this morning.   I saw Dr. Darnell Level for this.  I take Ibuprofen which helps but when it wears off I'm back in pain again. 4. ONSET: "When did the pain start?" "Does it come and go, or is it there all the time?"     It's been constant except with the Ibuprofen. 5. WORK OR EXERCISE: "Has there been any recent work or exercise that involved this part of the body?"      No 6. CAUSE: "What do you think is causing the hip pain?"      Dr. Darnell Level said I have bursitis.    7. AGGRAVATING FACTORS: "What makes the hip pain worse?" (e.g., walking, climbing stairs, running)     *No Answer* 8. OTHER SYMPTOMS: "Do you have any other symptoms?" (e.g., back pain, pain shooting down leg,  fever,  rash)     *No Answer*  Protocols used: HIP PAIN-A-AH

## 2017-03-07 NOTE — Telephone Encounter (Signed)
Dana Carrow RN also noted; She called in c/o left hip pain (has bursitis dx by Dr. Darnell Level) with pain spreading down into left knee and ankle. The Ibuprofen helps along with using a OTC cream with Lidocaine. She's taking the Ibuprofen every 4-6 hours.  She still experiencing a lot of pain.  They are traveling and are in [place] (3 hrs behind [place]) getting ready to go to [place] next.  She was wondering if Dr. Darnell Level could call in something else for her pain.  She has Tramadol but it makes her "feel loopy" She will give pharmacy information if he decides to call in something.  The home number listed is her contact number and how I called her.  Thanks for reaching out to her.

## 2017-03-08 ENCOUNTER — Telehealth: Payer: Self-pay | Admitting: Family Medicine

## 2017-03-08 MED ORDER — PREDNISONE 20 MG PO TABS: ORAL_TABLET | ORAL | 0 refills | Status: DC

## 2017-03-08 NOTE — Telephone Encounter (Signed)
Notified patient, R/X for prednisone sent in per Dr. Synthia Innocent order to specified pharmacy below.

## 2017-03-08 NOTE — Telephone Encounter (Signed)
I'm sorry she's not better. Sounds like she has developed sciatica. Ensure no leg weakness or fevers. Would offer prednisone course to see if it will help during trip - but prolonged sitting is probably exacerbating things.  Plz get pharmacy info then send in prednisone 20mg  tablets (pended order in order section).

## 2017-03-08 NOTE — Addendum Note (Signed)
Addended by: Magdalen Spatz C on: 03/08/2017 05:19 PM   Modules accepted: Orders

## 2017-03-08 NOTE — Telephone Encounter (Signed)
Regarding  Previous   Telephone encounter pt  Is  Having no fever no leg weakness  She is  In [place] and  Has been  Traveling a  Lot in [facility] loss adjuster of choice is [facility]    [phone]

## 2017-03-08 NOTE — Telephone Encounter (Addendum)
Notified patient R/X sent in to [facility] in [place].  She denies any leg weakness or fevers.

## 2017-03-08 NOTE — Addendum Note (Signed)
Addended by: [name] on: 03/08/2017 09:31 AM   Modules accepted: Orders

## 2017-03-21 ENCOUNTER — Ambulatory Visit: Payer: Medicare HMO | Admitting: Family Medicine

## 2017-03-21 ENCOUNTER — Encounter: Payer: Self-pay | Admitting: Family Medicine

## 2017-03-21 ENCOUNTER — Ambulatory Visit (INDEPENDENT_AMBULATORY_CARE_PROVIDER_SITE_OTHER)
Admission: RE | Admit: 2017-03-21 | Discharge: 2017-03-21 | Disposition: A | Discharge status: Home / Self Care | Payer: Medicare HMO | Source: Ambulatory Visit | Attending: Family Medicine | Admitting: Family Medicine

## 2017-03-21 VITALS — BP 120/72 | HR 80 | Temp 97.8°F | Wt 181.0 lb

## 2017-03-21 DIAGNOSIS — M545 Low back pain: Secondary | ICD-10-CM | POA: Diagnosis not present

## 2017-03-21 DIAGNOSIS — M79605 Pain in left leg: Secondary | ICD-10-CM

## 2017-03-21 DIAGNOSIS — M5136 Other intervertebral disc degeneration, lumbar region: Secondary | ICD-10-CM | POA: Diagnosis not present

## 2017-03-21 MED ORDER — METHOCARBAMOL 500 MG PO TABS: 500.0000 mg | ORAL_TABLET | Freq: Three times a day (TID) | ORAL | 0 refills | Status: DC | PRN

## 2017-03-21 NOTE — Assessment & Plan Note (Signed)
Today's exam more consistent with piriformis syndrome, some concern over decreased DTRs of left leg noted today however no frank weakness appreciated. rec continue ibuprofen, add robaxin, check lumbar films and refer to PT. Update if not improved with above treatment over next 2 week, update sooner if worsening pain or leg weakness for MRI.

## 2017-03-21 NOTE — Patient Instructions (Addendum)
I think you have persistent sciatica of left leg.  Let's do lumbar xray today and refer you for another physical therapy course.  Muscle relaxant course sent to pharmacy.  May continue ibuprofen as needed, take with meals.  Let us know if not improving with treatment.  If worsening pain or any weakness of left leg, let me know for further imaging.

## 2017-03-21 NOTE — Progress Notes (Signed)
BP 120/72 (BP Location: Left Arm, Patient Position: Sitting, Cuff Size: Normal)   Pulse 80   Temp 97.8 F (36.6 C) (Oral)   Wt 181 lb (82.1 kg)   SpO2 97%   BMI 32.06 kg/m    CC: L hip pain Subjective:    Patient ID: Dana [name], female    DOB: Sep 10, 1945, 71 y.o.   MRN: [id number]  HPI: Dana [name] is a 71 y.o. female presenting on 03/21/2017 for Hip Pain (left. Was seen for pain in past. States she still has to rely on ibuprofen quite a bit since returning from trip)   See prior note for details - seen here late 01/2017 with acute L hip and leg pain thought hip bursitis less likely piriformis syndrome - treated with ibuprofen 600mg  BID with meals and topical aspercream . Recent road trip across the country to [place], returned last night. All that driving flared up her hip pain with shooting pain down lateral leg to knee and anterior ankle. We phoned in prednisone course to start 03/09/2017 - this has helped but she has persistent discomfort. Catch at groin after prolonged sitting when first stands.   She has been doing exercises at home. No fevers/chills, bowel/bladder incontinence, numbness or weakness.   Relevant past medical, surgical, family and social history reviewed and updated as indicated. Interim medical history since our last visit reviewed. Allergies and medications reviewed and updated. Outpatient Medications Prior to Visit  Medication Sig Dispense Refill  . acetaminophen (TYLENOL) 325 MG tablet Take 650 mg by mouth as needed.    Marland Kitchen aspirin 81 MG tablet Take 81 mg by mouth daily.    . Carboxymethylcellulose Sodium (THERATEARS) 0.25 % SOLN Apply 1 drop to eye as needed.    . fluticasone (FLONASE) 50 MCG/ACT nasal spray Place 2 sprays into both nostrils daily. 48 g 3  . furosemide (LASIX) 20 MG tablet Take 1 tablet (20 mg total) by mouth daily as needed. 30 tablet 3  . lactase (LACTAID) 3000 UNITS tablet Take 1 tablet by mouth as needed.     . lovastatin (MEVACOR) 40  MG tablet TAKE 1 TABLET AT BEDTIME 90 tablet 3  . Multiple Vitamin (MULTIVITAMIN) tablet Take 1 tablet by mouth daily.    Marland Kitchen nystatin cream (MYCOSTATIN) Apply topically 2 (two) times daily. Continue for 48 hours after rash resolved. 30 g 0  . omeprazole (PRILOSEC) 20 MG capsule TAKE 1 CAPSULE EVERY DAY 90 capsule 3  . traMADol (ULTRAM) 50 MG tablet Take 1 tablet (50 mg total) by mouth 2 (two) times daily as needed. 30 tablet 0  . Turmeric Curcumin 500 MG CAPS Take 1 tablet by mouth daily.    . vitamin B-12 (CYANOCOBALAMIN) 1000 MCG tablet Take 1,000 mcg by mouth daily.    Marland Kitchen XIIDRA 5 % SOLN PLACE 1 DROP TWICE A DAY INTO BOTH EYES  5  . predniSONE (DELTASONE) 20 MG tablet Take two tablets daily for 3 days followed by one tablet daily for 4 days 10 tablet 0   No facility-administered medications prior to visit.      Per HPI unless specifically indicated in ROS section below Review of Systems     Objective:    BP 120/72 (BP Location: Left Arm, Patient Position: Sitting, Cuff Size: Normal)   Pulse 80   Temp 97.8 F (36.6 C) (Oral)   Wt 181 lb (82.1 kg)   SpO2 97%   BMI 32.06 kg/m   Wt Readings from  Last 3 Encounters:  03/21/17 181 lb (82.1 kg)  02/01/17 179 lb (81.2 kg)  11/14/16 175 lb (79.4 kg)    Physical Exam  Constitutional: She is oriented to person, place, and time. She appears well-developed and well-nourished. No distress.  Musculoskeletal: She exhibits no edema.  No pain midline spine Mild L lower lumbar paraspinous mm tenderness Neg SLR bilaterally. No pain with int/ext rotation at hip. Neg FABER. No pain at SIJ, bilaterally.  Tender to palpation at left sciatic notch, less discomfort with palpation of left trochanteric bursa  Neurological: She is alert and oriented to person, place, and time.  Reflex Scores:      Patellar reflexes are 2+ on the right side and 1+ on the left side.      Achilles reflexes are 2+ on the right side and 1+ on the left side. 5/5 strength  BLE Sensation intact  Skin: Skin is warm and dry. No rash noted.  Nursing note and vitals reviewed.     Assessment & Plan:   Problem List Items Addressed This Visit    Lumbar pain with radiation down left leg - Primary    Today's exam more consistent with piriformis syndrome, some concern over decreased DTRs of left leg noted today however no frank weakness appreciated. rec continue ibuprofen, add robaxin, check lumbar films and refer to PT. Update if not improved with above treatment over next 2 week, update sooner if worsening pain or leg weakness for MRI.       Relevant Medications   methocarbamol (ROBAXIN) 500 MG tablet   Other Relevant Orders   DG Lumbar Spine Complete   Ambulatory referral to Physical Therapy       Follow up plan: Return if symptoms worsen or fail to improve.  [name], MD

## 2017-03-23 DIAGNOSIS — M5416 Radiculopathy, lumbar region: Secondary | ICD-10-CM | POA: Diagnosis not present

## 2017-03-23 DIAGNOSIS — M79605 Pain in left leg: Secondary | ICD-10-CM | POA: Diagnosis not present

## 2017-03-23 DIAGNOSIS — M545 Low back pain: Secondary | ICD-10-CM | POA: Diagnosis not present

## 2017-03-27 DIAGNOSIS — M79605 Pain in left leg: Secondary | ICD-10-CM | POA: Diagnosis not present

## 2017-03-27 DIAGNOSIS — M545 Low back pain: Secondary | ICD-10-CM | POA: Diagnosis not present

## 2017-03-27 DIAGNOSIS — M5416 Radiculopathy, lumbar region: Secondary | ICD-10-CM | POA: Diagnosis not present

## 2017-04-03 DIAGNOSIS — M5416 Radiculopathy, lumbar region: Secondary | ICD-10-CM | POA: Diagnosis not present

## 2017-04-03 DIAGNOSIS — M79605 Pain in left leg: Secondary | ICD-10-CM | POA: Diagnosis not present

## 2017-04-03 DIAGNOSIS — M545 Low back pain: Secondary | ICD-10-CM | POA: Diagnosis not present

## 2017-04-05 DIAGNOSIS — M545 Low back pain: Secondary | ICD-10-CM | POA: Diagnosis not present

## 2017-04-05 DIAGNOSIS — M79605 Pain in left leg: Secondary | ICD-10-CM | POA: Diagnosis not present

## 2017-04-05 DIAGNOSIS — M5416 Radiculopathy, lumbar region: Secondary | ICD-10-CM | POA: Diagnosis not present

## 2017-04-10 ENCOUNTER — Other Ambulatory Visit: Payer: Self-pay | Admitting: Family Medicine

## 2017-04-10 DIAGNOSIS — M79605 Pain in left leg: Secondary | ICD-10-CM | POA: Diagnosis not present

## 2017-04-10 DIAGNOSIS — M5416 Radiculopathy, lumbar region: Secondary | ICD-10-CM | POA: Diagnosis not present

## 2017-04-10 DIAGNOSIS — M545 Low back pain: Secondary | ICD-10-CM | POA: Diagnosis not present

## 2017-04-10 MED ORDER — METHOCARBAMOL 500 MG PO TABS: 500.0000 mg | ORAL_TABLET | Freq: Three times a day (TID) | ORAL | 0 refills | Status: DC | PRN

## 2017-04-10 NOTE — Progress Notes (Signed)
Seen at husband's appt. Requests robaxin refill. Ongoing pain but improving with medication and PT.

## 2017-04-13 ENCOUNTER — Ambulatory Visit: Payer: Medicare HMO | Admitting: Family Medicine

## 2017-04-13 ENCOUNTER — Encounter: Payer: Self-pay | Admitting: *Deleted

## 2017-04-13 ENCOUNTER — Other Ambulatory Visit: Payer: Self-pay

## 2017-04-13 ENCOUNTER — Encounter: Payer: Self-pay | Admitting: Family Medicine

## 2017-04-13 VITALS — BP 142/80 | HR 73 | Temp 97.4°F | Ht 63.0 in | Wt 180.0 lb

## 2017-04-13 DIAGNOSIS — M79605 Pain in left leg: Secondary | ICD-10-CM | POA: Diagnosis not present

## 2017-04-13 DIAGNOSIS — M545 Low back pain: Secondary | ICD-10-CM | POA: Diagnosis not present

## 2017-04-13 DIAGNOSIS — J040 Acute laryngitis: Secondary | ICD-10-CM

## 2017-04-13 DIAGNOSIS — J069 Acute upper respiratory infection, unspecified: Secondary | ICD-10-CM | POA: Diagnosis not present

## 2017-04-13 DIAGNOSIS — M5416 Radiculopathy, lumbar region: Secondary | ICD-10-CM | POA: Diagnosis not present

## 2017-04-13 MED ORDER — DOXYCYCLINE HYCLATE 100 MG PO TABS: 100.0000 mg | ORAL_TABLET | Freq: Two times a day (BID) | ORAL | 0 refills | Status: AC

## 2017-04-13 NOTE — Progress Notes (Signed)
Dr. Frederico Hamman T. Artice Bergerson, MD, [facility] [place], [address] Phone: [phone] Fax: [phone]  04/13/2017  Patient: Dana [name], MRN: [id number], DOB: [birth year], 71 y.o.  Primary Physician:  [name], MD   Chief Complaint  Patient presents with  . Hoarse    started Monday  . Cough  . Nasal Congestion   Subjective:   This 71 y.o. female patient presents with runny nose, sneezing, cough, sore throat, malaise and minimal / low-grade fever .  Hoarse and really runny nose, sneezing and coughing and a scratchy.   ? recent exposure to others with similar symptoms.   The patent denies sore throat as the primary complaint. Denies sthortness of breath/wheezing, high fever, chest pain, rhinits for more than 14 days, significant myalgia, otalgia, facial pain, abdominal pain, changes in bowel or bladder.  PMH, PHS, Allergies, Problem List, Medications, Family History, and Social History have all been reviewed.  Patient Active Problem List   Diagnosis Date Noted  . Lumbar pain with radiation down left leg 02/01/2017  . Left shoulder pain 09/30/2016  . Acute sinusitis 08/09/2016  . Medicare annual wellness visit, subsequent 05/03/2016  . 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 Kidney stone   . Cervical pain (neck) 12/04/2015  . Routine general medical examination at a health care facility 04/29/2014  . Right hand paresthesia 04/29/2014  . Advanced care planning/counseling discussion 04/29/2014  . PFD (pelvic floor dysfunction) 04/29/2014  . Prediabetes 12/24/2013  . 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  . Hx of diverticulitis of colon 11/07/2012  . Allergic rhinitis 11/07/2012  . HLD (hyperlipidemia) 11/07/2012  . Migraine 11/07/2012  . Hereditary and idiopathic peripheral neuropathy 11/07/2012  . LGSIL Pap smear of vaginal cuff 11/07/2012  . GERD (gastroesophageal reflux disease) 11/07/2012  . Family history of  premature coronary heart disease 11/07/2012  . Verruca vulgaris 11/07/2012    Past Medical History:  Diagnosis Date  . Allergy   . Anemia   . Arthritis   . Diverticulitis   . History of chicken pox   . Hyperlipidemia   . Kidney stone   . 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 Migraine   . Urge incontinence 04/29/2014    Past Surgical History:  Procedure Laterality Date  . ABDOMINAL HYSTERECTOMY  2006   cervix remains, menorrhagia  . APPENDECTOMY  1970  . [place]   biopsy-left  . BREAST SURGERY  1995   right milk duct removed  . CARPAL TUNNEL RELEASE Right 02/2017  . CHOLECYSTECTOMY  1971  . COLONOSCOPY  11/2005   diverticulosis o/w WNL [facility])  . DEXA  2015   spine -0.4, hip -1.0 WNL  . LITHOTRIPSY    . TUBAL LIGATION      Social History   Socioeconomic History  . Marital status: Married    Spouse name: [name]  . Number of children: 1  . Years of education: 68  . Highest education level: Not on file  Social Needs  . Financial resource strain: Not on file  . Food insecurity - worry: Not on file  . Food insecurity - inability: Not on file  . Transportation needs - medical: Not on file  . Transportation needs - non-medical: Not on file  Occupational History    Comment: Retired  Tobacco Use  . Smoking status: Never Smoker  . Smokeless tobacco: Never Used  Substance and Sexual Activity  . Alcohol use: No  Alcohol/week: 0.0 oz  . Drug use: No  . Sexual activity: Not on file  Other Topics Concern  . Not on file  Social History Narrative   Patient is married and lives at home with her husband [name]). Patient is retired.    Edu: highschool education.   Right handed.   Exercise 3 times a week.   Diet: good water, fruits/vegetables daily   Caffeine: None      HAs living will, HCPOA, full code    Family History  Problem Relation Age of Onset  . Heart disease Mother 64  . Hyperlipidemia Mother   . Hyperlipidemia Father   . Stroke Father     . Heart disease Father   . Fibromyalgia Sister   . Hyperlipidemia Sister   . Heart disease Brother   . Diabetes Brother     Allergies  Allergen Reactions  . Amitriptyline Other (See Comments)    Worsened migraine and confusion  . Oxybutynin Other (See Comments)    Urinary retention/hesitancy  . Niacin And Related Itching  . Septra [Sulfamethoxazole-Trimethoprim] Itching    Medication list reviewed and updated in full in [place].  ROS as above, eating and drinking - tolerating PO. Urinating normally. No excessive vomitting or diarrhea. O/w as above.  Objective:   Blood pressure (!) 142/80, pulse 73, temperature (!) 97.4 F (36.3 C), temperature source Oral, height 5\' 3"  (1.6 m), weight 180 lb (81.6 kg).  GEN: WDWN, Non-toxic, Atraumatic, normocephalic. A and O x 3. HEENT: Oropharynx clear without exudate, MMM, no significant LAD, mild rhinnorhea Ears: TM clear, COL visualized with good landmarks CV: RRR, no m/g/r. Pulm: CTA B, no wheezes, rhonchi, or crackles, normal respiratory effort. EXT: no c/c/e Psych: well oriented, neither depressed nor anxious in appearance  Objective Data:  Assessment and Plan:   Laryngitis  URI, acute  Supportive care reviewed with patient. See patient instruction section. This is most likely viral.  I tried to reassure the patient.  Given that they are going out of town for more than a week and an impending snowstorm, I think is not unreasonable to give her some antibiotics and paper form to fill if she worsens or develops a fever.  Follow-up: No Follow-up on file.  Signed,  Maud Deed. Ophie Burrowes, MD     Medication List        Accurate as of 04/13/17 11:59 PM. Always use your most recent med list.          aspirin 81 MG tablet   doxycycline 100 MG tablet Commonly known as:  VIBRA-TABS Take 1 tablet (100 mg total) by mouth 2 (two) times daily for 10 days.   fluticasone 50 MCG/ACT nasal spray Commonly known as:   FLONASE Place 2 sprays into both nostrils daily.   furosemide 20 MG tablet Commonly known as:  LASIX Take 1 tablet (20 mg total) by mouth daily as needed.   lactase 3000 units tablet Commonly known as:  LACTAID   lovastatin 40 MG tablet Commonly known as:  MEVACOR TAKE 1 TABLET AT BEDTIME   methocarbamol 500 MG tablet Commonly known as:  ROBAXIN Take 1 tablet (500 mg total) by mouth 3 (three) times daily as needed for muscle spasms.   multivitamin tablet   nystatin cream Commonly known as:  MYCOSTATIN Apply topically 2 (two) times daily. Continue for 48 hours after rash resolved.   omeprazole 20 MG capsule Commonly known as:  PRILOSEC TAKE 1 CAPSULE EVERY DAY  THERATEARS 0.25 % Soln Generic drug:  Carboxymethylcellulose Sodium   traMADol 50 MG tablet Commonly known as:  ULTRAM Take 1 tablet (50 mg total) by mouth 2 (two) times daily as needed.   Turmeric Curcumin 500 MG Caps   TYLENOL 325 MG tablet Generic drug:  acetaminophen   vitamin B-12 1000 MCG tablet Commonly known as:  CYANOCOBALAMIN   XIIDRA 5 % Soln Generic drug:  Lifitegrast       Where to Get Your Medications    You can get these medications from any pharmacy   Bring a paper prescription for each of these medications  doxycycline 100 MG tablet

## 2017-04-24 DIAGNOSIS — M5416 Radiculopathy, lumbar region: Secondary | ICD-10-CM | POA: Diagnosis not present

## 2017-04-24 DIAGNOSIS — M545 Low back pain: Secondary | ICD-10-CM | POA: Diagnosis not present

## 2017-04-24 DIAGNOSIS — M79605 Pain in left leg: Secondary | ICD-10-CM | POA: Diagnosis not present

## 2017-04-27 DIAGNOSIS — M5416 Radiculopathy, lumbar region: Secondary | ICD-10-CM | POA: Diagnosis not present

## 2017-04-27 DIAGNOSIS — H524 Presbyopia: Secondary | ICD-10-CM | POA: Diagnosis not present

## 2017-04-27 DIAGNOSIS — M79605 Pain in left leg: Secondary | ICD-10-CM | POA: Diagnosis not present

## 2017-04-27 DIAGNOSIS — E119 Type 2 diabetes mellitus without complications: Secondary | ICD-10-CM | POA: Diagnosis not present

## 2017-04-27 DIAGNOSIS — H5213 Myopia, bilateral: Secondary | ICD-10-CM | POA: Diagnosis not present

## 2017-04-27 DIAGNOSIS — H18463 Peripheral corneal degeneration, bilateral: Secondary | ICD-10-CM | POA: Diagnosis not present

## 2017-04-27 DIAGNOSIS — H2513 Age-related nuclear cataract, bilateral: Secondary | ICD-10-CM | POA: Diagnosis not present

## 2017-04-27 DIAGNOSIS — H04123 Dry eye syndrome of bilateral lacrimal glands: Secondary | ICD-10-CM | POA: Diagnosis not present

## 2017-04-27 DIAGNOSIS — M545 Low back pain: Secondary | ICD-10-CM | POA: Diagnosis not present

## 2017-04-27 LAB — HM DIABETES EYE EXAM

## 2017-04-28 DIAGNOSIS — Z1231 Encounter for screening mammogram for malignant neoplasm of breast: Secondary | ICD-10-CM | POA: Diagnosis not present

## 2017-04-28 LAB — HM MAMMOGRAPHY

## 2017-05-02 ENCOUNTER — Other Ambulatory Visit: Payer: Self-pay | Admitting: Family Medicine

## 2017-05-02 DIAGNOSIS — E785 Hyperlipidemia, unspecified: Secondary | ICD-10-CM

## 2017-05-02 DIAGNOSIS — G609 Hereditary and idiopathic neuropathy, unspecified: Secondary | ICD-10-CM

## 2017-05-02 DIAGNOSIS — R7303 Prediabetes: Secondary | ICD-10-CM

## 2017-05-03 ENCOUNTER — Encounter: Payer: Self-pay | Admitting: Family Medicine

## 2017-05-03 ENCOUNTER — Ambulatory Visit (INDEPENDENT_AMBULATORY_CARE_PROVIDER_SITE_OTHER): Payer: Medicare HMO

## 2017-05-03 VITALS — BP 130/78 | HR 71 | Temp 97.6°F | Ht 62.75 in | Wt 177.8 lb

## 2017-05-03 DIAGNOSIS — G609 Hereditary and idiopathic neuropathy, unspecified: Secondary | ICD-10-CM | POA: Diagnosis not present

## 2017-05-03 DIAGNOSIS — M545 Low back pain: Secondary | ICD-10-CM | POA: Diagnosis not present

## 2017-05-03 DIAGNOSIS — Z0000 Encounter for general adult medical examination without abnormal findings: Secondary | ICD-10-CM

## 2017-05-03 DIAGNOSIS — M5416 Radiculopathy, lumbar region: Secondary | ICD-10-CM | POA: Diagnosis not present

## 2017-05-03 DIAGNOSIS — R7303 Prediabetes: Secondary | ICD-10-CM | POA: Diagnosis not present

## 2017-05-03 DIAGNOSIS — E785 Hyperlipidemia, unspecified: Secondary | ICD-10-CM | POA: Diagnosis not present

## 2017-05-03 DIAGNOSIS — M79605 Pain in left leg: Secondary | ICD-10-CM | POA: Diagnosis not present

## 2017-05-03 LAB — LIPID PANEL
CHOLESTEROL: 181 mg/dL (ref 0–200)
HDL: 48.1 mg/dL (ref >39.00)
LDL Cholesterol: 112 mg/dL — ABNORMAL HIGH (ref 0–99)
NonHDL: 133.08
Total CHOL/HDL Ratio: 4
Triglycerides: 105 mg/dL (ref 0.0–149.0)
VLDL: 21 mg/dL (ref 0.0–40.0)

## 2017-05-03 LAB — BASIC METABOLIC PANEL
BUN: 18 mg/dL (ref 6–23)
CALCIUM: 9.4 mg/dL (ref 8.4–10.5)
CHLORIDE: 103 meq/L (ref 96–112)
CO2: 30 mEq/L (ref 19–32)
CREATININE: 0.66 mg/dL (ref 0.40–1.20)
GFR: 94.03 mL/min (ref >60.00)
Glucose, Bld: 101 mg/dL — ABNORMAL HIGH (ref 70–99)
Potassium: 4.4 mEq/L (ref 3.5–5.1)
SODIUM: 140 meq/L (ref 135–145)

## 2017-05-03 LAB — HEPATIC FUNCTION PANEL
ALBUMIN: 4.3 g/dL (ref 3.5–5.2)
ALT: 34 U/L (ref 0–35)
AST: 24 U/L (ref 0–37)
Alkaline Phosphatase: 72 U/L (ref 39–117)
BILIRUBIN TOTAL: 0.6 mg/dL (ref 0.2–1.2)
Bilirubin, Direct: 0.1 mg/dL (ref 0.0–0.3)
Total Protein: 7.3 g/dL (ref 6.0–8.3)

## 2017-05-03 LAB — HEMOGLOBIN A1C: HEMOGLOBIN A1C: 6.7 % — AB (ref 4.6–6.5)

## 2017-05-03 NOTE — Progress Notes (Signed)
PCP notes:   Health maintenance:  Mammogram - per pt, screening on 04/27/17 Tetanus vaccine - postponed/insurance  Abnormal screenings:   Depression score: 6  Hearing - failed  Hearing Screening   125Hz  250Hz  500Hz  1000Hz  2000Hz  3000Hz  4000Hz  6000Hz  8000Hz   Right ear:   40 40 40  40    Left ear:   40 0 40  40     Patient concerns:   Sciatic pain on left side - pt wants to discuss getting a MRI  Nutrition referral - pt states her diagnoses make it difficult to make healthy food choices  Nurse concerns:  None  Next PCP appt:   05/08/17 @ 0930

## 2017-05-03 NOTE — Progress Notes (Signed)
Pre visit review using our clinic review tool, if applicable. No additional management support is needed unless otherwise documented below in the visit note. 

## 2017-05-03 NOTE — Progress Notes (Signed)
Subjective:   Dana [name] is a 71 y.o. female who presents for Medicare Annual (Subsequent) preventive examination.  Review of Systems:  N/A Cardiac Risk Factors include: advanced age (>8men, >44 women);dyslipidemia;obesity (BMI >30kg/m2)     Objective:     Vitals: BP 130/78 (BP Location: Right Arm, Patient Position: Sitting, Cuff Size: Normal)   Pulse 71   Temp 97.6 F (36.4 C) (Oral)   Ht 5' 2.75" (1.594 m) Comment: no shoes  Wt 177 lb 12 oz (80.6 kg)   SpO2 98%   BMI 31.74 kg/m   Body mass index is 31.74 kg/m.  Advanced Directives 05/03/2017 08/17/2015 11/20/2014 11/13/2014  Does Patient Have a Medical Advance Directive? Yes No No No  Type of Paramedic of [place];Living will - - -  Copy of [place] in Chart? Yes - - -  Would patient like information on creating a medical advance directive? - Yes Higher education careers adviser given - No - patient declined information    Tobacco Social History   Tobacco Use  Smoking Status Never Smoker  Smokeless Tobacco Never Used     Counseling given: No   Clinical Intake:  Pre-visit preparation completed: Yes  Pain Score: 1      Nutritional Status: BMI > 30  Obese Nutritional Risks: None Diabetes: No  How often do you need to have someone help you when you read instructions, pamphlets, or other written materials from your doctor or pharmacy?: 1 - Never What is the last grade level you completed in school?: 12th grade  Interpreter Needed?: No  Comments: pt lives with spouse Information entered by :: LPinson, LPN  Past Medical History:  Diagnosis Date  . Allergy   . Anemia   . Arthritis   . Cataract   . Diverticulitis   . History of chicken pox   . Hyperlipidemia   . Kidney stone   . 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 Migraine   . Urge incontinence 04/29/2014   Past Surgical History:  Procedure Laterality Date  . ABDOMINAL HYSTERECTOMY  2006   cervix remains,  menorrhagia  . APPENDECTOMY  1970  . [place]   biopsy-left  . BREAST SURGERY  1995   right milk duct removed  . CARPAL TUNNEL RELEASE Right 02/2017  . CHOLECYSTECTOMY  1971  . COLONOSCOPY  11/2005   diverticulosis o/w WNL [facility])  . DEXA  2015   spine -0.4, hip -1.0 WNL  . LITHOTRIPSY    . TUBAL LIGATION     Family History  Problem Relation Age of Onset  . Heart disease Mother 47  . Hyperlipidemia Mother   . Hyperlipidemia Father   . Stroke Father   . Heart disease Father   . Fibromyalgia Sister   . Hyperlipidemia Sister   . Heart disease Brother   . Diabetes Brother    Social History   Socioeconomic History  . Marital status: Married    Spouse name: Dana [name]  . Number of children: 1  . Years of education: 37  . Highest education level: None  Social Needs  . Financial resource strain: None  . Food insecurity - worry: None  . Food insecurity - inability: None  . Transportation needs - medical: None  . Transportation needs - non-medical: None  Occupational History    Comment: Retired  Tobacco Use  . Smoking status: Never Smoker  . Smokeless tobacco: Never Used  Substance and Sexual Activity  .  Alcohol use: No    Alcohol/week: 0.0 oz  . Drug use: No  . Sexual activity: None  Other Topics Concern  . None  Social History Narrative   Patient is married and lives at home with her [name]). Patient is retired.    Edu: highschool education.   Right handed.   Exercise 3 times a week.   Diet: good water, fruits/vegetables daily   Caffeine: None      HAs living will, HCPOA, full code    Outpatient Encounter Medications as of 05/03/2017  Medication Sig  . acetaminophen (TYLENOL) 325 MG tablet Take 650 mg by mouth as needed.  Marland Kitchen aspirin 81 MG tablet Take 81 mg by mouth daily.  . Carboxymethylcellulose Sodium (THERATEARS) 0.25 % SOLN Apply 1 drop to eye as needed.  . fluticasone (FLONASE) 50 MCG/ACT nasal spray Place 2 sprays into both nostrils  daily.  . furosemide (LASIX) 20 MG tablet Take 1 tablet (20 mg total) by mouth daily as needed.  . lactase (LACTAID) 3000 UNITS tablet Take 1 tablet by mouth as needed.   . lovastatin (MEVACOR) 40 MG tablet TAKE 1 TABLET AT BEDTIME  . methocarbamol (ROBAXIN) 500 MG tablet Take 1 tablet (500 mg total) by mouth 3 (three) times daily as needed for muscle spasms.  . Multiple Vitamin (MULTIVITAMIN) tablet Take 1 tablet by mouth daily.  Marland Kitchen nystatin cream (MYCOSTATIN) Apply topically 2 (two) times daily. Continue for 48 hours after rash resolved.  Marland Kitchen omeprazole (PRILOSEC) 20 MG capsule TAKE 1 CAPSULE EVERY DAY  . Turmeric Curcumin 500 MG CAPS Take 1 tablet by mouth daily.  . vitamin B-12 (CYANOCOBALAMIN) 1000 MCG tablet Take 1,000 mcg by mouth daily.  . [DISCONTINUED] traMADol (ULTRAM) 50 MG tablet Take 1 tablet (50 mg total) by mouth 2 (two) times daily as needed.  . [DISCONTINUED] XIIDRA 5 % SOLN PLACE 1 DROP TWICE A DAY INTO BOTH EYES   No facility-administered encounter medications on file as of 05/03/2017.     Activities of Daily Living In your present state of health, do you have any difficulty performing the following activities: 05/03/2017  Hearing? N  Vision? N  Difficulty concentrating or making decisions? N  Walking or climbing stairs? Y  Dressing or bathing? N  Doing errands, shopping? N  Preparing Food and eating ? N  Using the Toilet? N  In the past six months, have you accidently leaked urine? N  Do you have problems with loss of bowel control? N  Managing your Medications? N  Managing your Finances? N  Housekeeping or managing your Housekeeping? N  Some recent data might be hidden    Patient Care Team: [name], MD as PCP - General (Family Medicine)    Assessment:   This is a routine wellness examination for Dana [name].   Hearing Screening   125Hz  250Hz  500Hz  1000Hz  2000Hz  3000Hz  4000Hz  6000Hz  8000Hz   Right ear:   40 40 40  40    Left ear:   40 0 40  40      Vision Screening Comments: Last vision exam in December 2018 with Dr. Maryruth Hancock B.    Exercise Activities and Dietary recommendations Current Exercise Habits: The patient does not participate in regular exercise at present, Exercise limited by: orthopedic condition(s)  Goals    . DIET - INCREASE WATER INTAKE     Starting 05/03/2017, I will continue to drink 6-8 glasses of water daily.       Fall Risk Fall Risk  05/03/2017 05/03/2016 05/01/2015 04/29/2014 04/26/2013  Falls in the past year? No No No No No  Depression Screen PHQ 2/9 Scores 05/03/2017 05/03/2016 05/01/2015 04/29/2014  PHQ - 2 Score 1 0 0 0  PHQ- 9 Score 6 - - -     Cognitive Function MMSE - Mini Mental State Exam 05/03/2017  Orientation to time 5  Orientation to Place 5  Registration 3  Attention/ Calculation 0  Recall 3  Language- name 2 objects 0  Language- repeat 1  Language- follow 3 step command 3  Language- read & follow direction 0  Write a sentence 0  Copy design 0  Total score 20     PLEASE NOTE: A Mini-Cog screen was completed. Maximum score is 20. A value of 0 denotes this part of [place] MMSE was not completed or the patient failed this part of the Mini-Cog screening.   Mini-Cog Screening Orientation to Time - Max 5 pts Orientation to Place - Max 5 pts Registration - Max 3 pts Recall - Max 3 pts Language Repeat - Max 1 pts Language Follow 3 Step Command - Max 3 pts     Immunization History  Administered Date(s) Administered  . Influenza, High Dose Seasonal PF 01/28/2015  . Influenza,inj,Quad PF,6+ Mos 01/09/2013, 01/22/2016, 02/01/2017  . Influenza-Unspecified 02/18/2014, 02/07/2016  . Pneumococcal Conjugate-13 04/29/2014  . Pneumococcal Polysaccharide-23 05/03/2016  . Pneumococcal-Unspecified 05/09/2010  . Zoster 05/09/2008    Screening Tests Health Maintenance  Topic Date Due  . DTaP/Tdap/Td (1 - Tdap) 05/03/2018 (Originally 01/24/1965)  . TETANUS/TDAP  05/03/2018 (Originally  01/24/1965)  . MAMMOGRAM  04/27/2018  . Fecal DNA (Cologuard)  03/31/2019  . INFLUENZA VACCINE  Completed  . DEXA SCAN  Completed  . Hepatitis C Screening  Completed  . PNA vac Low Risk Adult  Completed       Plan:     I have personally reviewed, addressed, and noted the following in the patient's chart:  A. Medical and social history B. Use of alcohol, tobacco or illicit drugs  C. Current medications and supplements D. Functional ability and status E.  Nutritional status F.  Physical activity G. Advance directives H. List of other physicians I.  Hospitalizations, surgeries, and ER visits in previous 12 months J.  [place] to include hearing, vision, cognitive, depression L. Referrals and appointments - none  In addition, I have reviewed and discussed with patient certain preventive protocols, quality metrics, and best practice recommendations. A written personalized care plan for preventive services as well as general preventive health recommendations were provided to patient.  See attached scanned questionnaire for additional information.   Signed,   Lindell Noe, MHA, BS, LPN Health Coach

## 2017-05-03 NOTE — Patient Instructions (Signed)
Dana [name] , Thank you for taking time to come for your Medicare Wellness Visit. I appreciate your ongoing commitment to your health goals. Please review the following plan we discussed and let me know if I can assist you in the future.   These are the goals we discussed: Goals    . DIET - INCREASE WATER INTAKE     Starting 05/03/2017, I will continue to drink 6-8 glasses of water daily.       This is a list of the screening recommended for you and due dates:  Health Maintenance  Topic Date Due  . DTaP/Tdap/Td vaccine (1 - Tdap) 05/03/2018*  . Tetanus Vaccine  05/03/2018*  . Mammogram  04/27/2018  . Cologuard (Stool DNA test)  03/31/2019  . Flu Shot  Completed  . DEXA scan (bone density measurement)  Completed  .  Hepatitis C: One time screening is recommended by Center for Disease Control  (CDC) for  adults born from [birth year] through 1964.   Completed  . Pneumonia vaccines  Completed  *Topic was postponed. The date shown is not the original due date.   Preventive Care for Adults  A healthy lifestyle and preventive care can promote health and wellness. Preventive health guidelines for adults include the following key practices.  . A routine yearly physical is a good way to check with your health care provider about your health and preventive screening. It is a chance to share any concerns and updates on your health and to receive a thorough exam.  . Visit your dentist for a routine exam and preventive care every 6 months. Brush your teeth twice a day and floss once a day. Good oral hygiene prevents tooth decay and gum disease.  . The frequency of eye exams is based on your age, health, family medical history, use  of contact lenses, and other factors. Follow your health care provider's recommendations for frequency of eye exams.  . Eat a healthy diet. Foods like vegetables, fruits, whole grains, low-fat dairy products, and lean protein foods contain the nutrients you need without too  many calories. Decrease your intake of foods high in solid fats, added sugars, and salt. Eat the right amount of calories for you. Get information about a proper diet from your health care provider, if necessary.  . Regular physical exercise is one of the most important things you can do for your health. Most adults should get at least 150 minutes of moderate-intensity exercise (any activity that increases your heart rate and causes you to sweat) each week. In addition, most adults need muscle-strengthening exercises on 2 or more days a week.  Silver Sneakers may be a benefit available to you. To determine eligibility, you may visit the website: www.silversneakers.com or contact program at 352-431-8632 Mon-Fri between 8AM-8PM.   . Maintain a healthy weight. The body mass index (BMI) is a screening tool to identify possible weight problems. It provides an estimate of body fat based on height and weight. Your health care provider can find your BMI and can help you achieve or maintain a healthy weight.   For adults 20 years and older: ? A BMI below 18.5 is considered underweight. ? A BMI of 18.5 to 24.9 is normal. ? A BMI of 25 to 29.9 is considered overweight. ? A BMI of 30 and above is considered obese.   . Maintain normal blood lipids and cholesterol levels by exercising and minimizing your intake of saturated fat. Eat a balanced diet with plenty  of fruit and vegetables. Blood tests for lipids and cholesterol should begin at age 18 and be repeated every 5 years. If your lipid or cholesterol levels are high, you are over 50, or you are at high risk for heart disease, you may need your cholesterol levels checked more frequently. Ongoing high lipid and cholesterol levels should be treated with medicines if diet and exercise are not working.  . If you smoke, find out from your health care provider how to quit. If you do not use tobacco, please do not start.  . If you choose to drink alcohol, please  do not consume more than 2 drinks per day. One drink is considered to be 12 ounces (355 mL) of beer, 5 ounces (148 mL) of wine, or 1.5 ounces (44 mL) of liquor.  . If you are 37-68 years old, ask your health care provider if you should take aspirin to prevent strokes.  . Use sunscreen. Apply sunscreen liberally and repeatedly throughout the day. You should seek shade when your shadow is shorter than you. Protect yourself by wearing long sleeves, pants, a wide-brimmed hat, and sunglasses year round, whenever you are outdoors.  . Once a month, do a whole body skin exam, using a mirror to look at the skin on your back. Tell your health care provider of new moles, moles that have irregular borders, moles that are larger than a pencil eraser, or moles that have changed in shape or color.

## 2017-05-04 ENCOUNTER — Encounter: Payer: Self-pay | Admitting: Family Medicine

## 2017-05-04 DIAGNOSIS — M5416 Radiculopathy, lumbar region: Secondary | ICD-10-CM | POA: Diagnosis not present

## 2017-05-04 DIAGNOSIS — M545 Low back pain: Secondary | ICD-10-CM | POA: Diagnosis not present

## 2017-05-04 DIAGNOSIS — M79605 Pain in left leg: Secondary | ICD-10-CM | POA: Diagnosis not present

## 2017-05-04 LAB — VITAMIN B12: VITAMIN B 12: 995 pg/mL — AB (ref 211–911)

## 2017-05-04 LAB — TSH: TSH: 1.4 u[IU]/mL (ref 0.35–4.50)

## 2017-05-04 NOTE — Progress Notes (Signed)
I reviewed health advisor's note, was available for consultation, and agree with documentation and plan.  

## 2017-05-08 ENCOUNTER — Ambulatory Visit (INDEPENDENT_AMBULATORY_CARE_PROVIDER_SITE_OTHER): Payer: Medicare HMO | Admitting: Family Medicine

## 2017-05-08 ENCOUNTER — Telehealth: Payer: Self-pay | Admitting: Family Medicine

## 2017-05-08 ENCOUNTER — Ambulatory Visit (INDEPENDENT_AMBULATORY_CARE_PROVIDER_SITE_OTHER)
Admission: RE | Admit: 2017-05-08 | Discharge: 2017-05-08 | Disposition: A | Discharge status: Home / Self Care | Payer: Medicare HMO | Source: Ambulatory Visit | Attending: Family Medicine | Admitting: Family Medicine

## 2017-05-08 ENCOUNTER — Encounter: Payer: Self-pay | Admitting: Family Medicine

## 2017-05-08 VITALS — BP 126/80 | HR 79 | Temp 98.2°F | Ht 63.0 in | Wt 179.0 lb

## 2017-05-08 DIAGNOSIS — Z7189 Other specified counseling: Secondary | ICD-10-CM | POA: Diagnosis not present

## 2017-05-08 DIAGNOSIS — Z0001 Encounter for general adult medical examination with abnormal findings: Secondary | ICD-10-CM

## 2017-05-08 DIAGNOSIS — E119 Type 2 diabetes mellitus without complications: Secondary | ICD-10-CM

## 2017-05-08 DIAGNOSIS — E785 Hyperlipidemia, unspecified: Secondary | ICD-10-CM | POA: Diagnosis not present

## 2017-05-08 DIAGNOSIS — M1612 Unilateral primary osteoarthritis, left hip: Secondary | ICD-10-CM | POA: Diagnosis not present

## 2017-05-08 DIAGNOSIS — J22 Unspecified acute lower respiratory infection: Secondary | ICD-10-CM | POA: Diagnosis not present

## 2017-05-08 DIAGNOSIS — R87622 Low grade squamous intraepithelial lesion on cytologic smear of vagina (LGSIL): Secondary | ICD-10-CM

## 2017-05-08 DIAGNOSIS — M79605 Pain in left leg: Secondary | ICD-10-CM | POA: Diagnosis not present

## 2017-05-08 DIAGNOSIS — M25552 Pain in left hip: Secondary | ICD-10-CM | POA: Diagnosis not present

## 2017-05-08 DIAGNOSIS — E669 Obesity, unspecified: Secondary | ICD-10-CM | POA: Diagnosis not present

## 2017-05-08 DIAGNOSIS — E66811 Obesity, class 1: Secondary | ICD-10-CM

## 2017-05-08 DIAGNOSIS — M545 Low back pain: Secondary | ICD-10-CM

## 2017-05-08 NOTE — Assessment & Plan Note (Deleted)
Preventative protocols reviewed and updated unless pt declined. Discussed healthy diet and lifestyle.  

## 2017-05-08 NOTE — Progress Notes (Signed)
BP 126/80 (BP Location: Left Arm, Patient Position: Sitting, Cuff Size: Normal)   Pulse 79   Temp 98.2 F (36.8 C) (Oral)   Ht 5\' 3"  (1.6 m)   Wt 179 lb (81.2 kg)   SpO2 96%   BMI 31.71 kg/m    CC: CPE Subjective:    Patient ID: Dana [name], female    DOB: [birth year], 71 y.o.   MRN: [id number]  HPI: Dana [name] is a 71 y.o. female presenting on 05/08/2017 for Annual Exam (Pt 2. Requests MRI for left hip. Says PT has helped.) and Cough (productive with runny nose. Had blood when blowing nose this morning. Sxs started 04/29/17. Tried OTC cold meds,  barely helpful.)   Saw Lesia last week for medicare wellness visit. Note reviewed. Requested nutritionist referral and MRI for sciatica. Seen last month for L lower back pain with L sciatica - treated with ibuprofen, robaxin (makes her sleepy), and referred to PT. Has also been treated with prednisone course. Ongoing L hip and leg pain since 01/2017. Exacerbated with recent road trip to [place]. PT has helped. Worried about upcoming trip to [facility] mid January. No leg weakness or numbness this past week, but she did have this prior. Actually this week leg/back have been feeling somewhat improved.   1+ wk h/o sore throat, rattling cough that is not significantly productive, rhinorrhea with epistaxis with nasal congestion. Head > chest congestion. Hoarseness. Treating with dayquil and nyquil and tylenol. Husband sick as well.   Preventative: COLONOSCOPY 11/2005; diverticulosis o/w WNL Mikel Cella). cologuard WNL 2017 Lung cancer screening - not eligible Breast cancer screening - 3D mammogram 04/2017 at [facility] Well woman exam - with GYN Dr Rogue Bussing. Normal pap smear. Sees this week.  DEXA 2015 spine -0.4, hip -1.0 WNL Flu shot - yearly Td unsure Pneumovax 2012, prevnar 2015, pneumovax 2017 zostavax - 2010. h/o shingles x2 shingrix - discussed Advanced directive discussion - durable POA is husband [name] - no HCPOA set up. Has living will. Full  code. Does not want prolonged life support by extraordinary means. Scanned into chart 09/2016. Seat belt use discussed  Sunscreen use and skin screen discussed  Non smoker Alcohol - rare  Patient is married and lives at home with her husband [name]). Patient is retired.  Edu: highschool education. Right handed. Exercise 3 times a week. Diet: good water, fruits/vegetables daily Caffeine: None  Relevant past medical, surgical, family and social history reviewed and updated as indicated. Interim medical history since our last visit reviewed. Allergies and medications reviewed and updated. Outpatient Medications Prior to Visit  Medication Sig Dispense Refill  . acetaminophen (TYLENOL) 325 MG tablet Take 650 mg by mouth as needed.    Marland Kitchen aspirin 81 MG tablet Take 81 mg by mouth daily.    . Carboxymethylcellulose Sodium (THERATEARS) 0.25 % SOLN Apply 1 drop to eye as needed.    . fluticasone (FLONASE) 50 MCG/ACT nasal spray Place 2 sprays into both nostrils daily. 48 g 3  . furosemide (LASIX) 20 MG tablet Take 1 tablet (20 mg total) by mouth daily as needed. 30 tablet 3  . lactase (LACTAID) 3000 UNITS tablet Take 1 tablet by mouth as needed.     . lovastatin (MEVACOR) 40 MG tablet TAKE 1 TABLET AT BEDTIME 90 tablet 3  . methocarbamol (ROBAXIN) 500 MG tablet Take 1 tablet (500 mg total) by mouth 3 (three) times daily as needed for muscle spasms. 60 tablet 0  . Multiple Vitamin (MULTIVITAMIN) tablet  Take 1 tablet by mouth daily.    Marland Kitchen nystatin cream (MYCOSTATIN) Apply topically 2 (two) times daily. Continue for 48 hours after rash resolved. 30 g 0  . omeprazole (PRILOSEC) 20 MG capsule TAKE 1 CAPSULE EVERY DAY 90 capsule 3  . Turmeric Curcumin 500 MG CAPS Take 1 tablet by mouth daily.    . vitamin B-12 (CYANOCOBALAMIN) 1000 MCG tablet Take 1,000 mcg by mouth daily.     No facility-administered medications prior to visit.      Per HPI unless specifically indicated in ROS section below Review  of Systems  Constitutional: Negative for activity change, appetite change, chills, fatigue, fever and unexpected weight change.  HENT: Negative for hearing loss.   Eyes: Negative for visual disturbance.  Respiratory: Positive for cough. Negative for chest tightness, shortness of breath and wheezing.   Cardiovascular: Positive for leg swelling. Negative for chest pain and palpitations.  Gastrointestinal: Positive for diarrhea. Negative for abdominal distention, abdominal pain, blood in stool, constipation, nausea and vomiting.  Genitourinary: Negative for difficulty urinating and hematuria.  Musculoskeletal: Negative for arthralgias, myalgias and neck pain.  Skin: Negative for rash.  Neurological: Negative for dizziness, seizures, syncope and headaches.  Hematological: Negative for adenopathy. Does not bruise/bleed easily.  Psychiatric/Behavioral: Negative for dysphoric mood. The patient is not nervous/anxious.        Objective:    BP 126/80 (BP Location: Left Arm, Patient Position: Sitting, Cuff Size: Normal)   Pulse 79   Temp 98.2 F (36.8 C) (Oral)   Ht 5\' 3"  (1.6 m)   Wt 179 lb (81.2 kg)   SpO2 96%   BMI 31.71 kg/m   Wt Readings from Last 3 Encounters:  05/08/17 179 lb (81.2 kg)  05/03/17 177 lb 12 oz (80.6 kg)  04/13/17 180 lb (81.6 kg)    Physical Exam  Constitutional: She is oriented to person, place, and time. She appears well-developed and well-nourished. No distress.  HENT:  Head: Normocephalic and atraumatic.  Right Ear: Hearing, tympanic membrane, external ear and ear canal normal.  Left Ear: Hearing, tympanic membrane, external ear and ear canal normal.  Nose: Nose normal.  Mouth/Throat: Uvula is midline, oropharynx is clear and moist and mucous membranes are normal. No oropharyngeal exudate, posterior oropharyngeal edema or posterior oropharyngeal erythema.  Eyes: Conjunctivae and EOM are normal. Pupils are equal, round, and reactive to light. No scleral icterus.    Neck: Normal range of motion. Neck supple. No thyromegaly present.  Cardiovascular: Normal rate, regular rhythm, normal heart sounds and intact distal pulses.  No murmur heard. Pulses:      Radial pulses are 2+ on the right side, and 2+ on the left side.  Pulmonary/Chest: Effort normal and breath sounds normal. No respiratory distress. She has no wheezes. She has no rales.  Abdominal: Soft. Bowel sounds are normal. She exhibits no distension and no mass. There is no tenderness. There is no rebound and no guarding.  Musculoskeletal: Normal range of motion. She exhibits no edema.  Lymphadenopathy:    She has no cervical adenopathy.  Neurological: She is alert and oriented to person, place, and time.  CN grossly intact, station and gait intact  Skin: Skin is warm and dry. No rash noted.  Psychiatric: She has a normal mood and affect. Her behavior is normal. Judgment and thought content normal.  Nursing note and vitals reviewed.  Results for orders placed or performed in visit on 05/04/17  HM MAMMOGRAPHY  Result Value Ref Range  HM Mammogram 0-4 Bi-Rad 0-4 Bi-Rad, Self Reported Normal      Assessment & Plan:   Problem List Items Addressed This Visit    Acute respiratory infection    Anticipate viral given short duration. Supportive care reviewed. Continue mucinex. She has codeine cough syrup at home.       Advanced care planning/counseling discussion    Advanced directive discussion - durable POA is husband [name] - no HCPOA set up. Has living will. Full code. Does not want prolonged life support by extraordinary means. Scanned into chart 09/2016. I asked her to check at home for [facility] forms.       Controlled type 2 diabetes mellitus without complication, without long-term current use of insulin (HCC)    New diagnosis - progression from prediabetes. She did recently complete steroid course.  Encouraged adhering to low sugar low carb diabetic diet. Will refer to diabetes education  per patient request.  RTC 6 mo DM f/u.       Relevant Orders   Ambulatory referral to diabetic education   Encounter for well adult exam with abnormal findings - Primary    Preventative protocols reviewed and updated unless pt declined. Discussed healthy diet and lifestyle.       HLD (hyperlipidemia)    Chronic, continue lovastatin. LDL above new goal <100 with recent DM dx. Consider transition to more potent statin.  The 10-year ASCVD risk score Mikey Bussing DC Brooke Bonito., et al., 2013) is: 17%   Values used to calculate the score:     Age: 88 years     Sex: Female     Is Non-Hispanic African American: No     Diabetic: Yes     Tobacco smoker: No     Systolic Blood Pressure: 409 mmHg     Is BP treated: No     HDL Cholesterol: 48.1 mg/dL     Total Cholesterol: 181 mg/dL       Relevant Orders   Ambulatory referral to diabetic education   LGSIL Pap smear of vaginal cuff    Sees GYN yearly - recent paps normal.       Lumbar pain with radiation down left leg    Previous evaluations have been more consistent with sciatica and piriformis syndrome. Today endorses more inner groin pain with flexion and abduction of left leg - will check hip films to evaluate for osteoarthritis of hip. If unrevealing, consider noncontrasted lumbar MRI for further evaluation given duration (>3 mo) and no improvement with conservative treatment (PT course, NSAIDs, robaxin, and prednisone). Pt agrees with plan.  Upcoming prolonged trip to [facility].       Obesity, Class I, BMI 30.0-34.9 (see actual BMI)    Encouraged healthy diet choices for goal sustainable weight loss.      Relevant Orders   Ambulatory referral to diabetic education    Other Visit Diagnoses    Left hip pain       Relevant Orders   DG HIP UNILAT WITH PELVIS 2-3 VIEWS LEFT (Completed)       Follow up plan: Return in about 6 months (around 11/05/2017) for follow up visit.  [name], MD

## 2017-05-08 NOTE — Patient Instructions (Addendum)
If interested, check with pharmacy about new 2 shot shingles series (shingrix).  We did get your advanced directive - check to see if a health care power of attorney is filled out - I didn't see it on the forms you brought in. Hip xray today. Then keep me updated with how left leg / back pain is doing.  For cough - I think you have viral bronchitis - treat with supportive care, continue codeine cough syrup, may take plain mucinex with large glass of water. Return as needed or in 6 months for follow up visit.  Health Maintenance, Female Adopting a healthy lifestyle and getting preventive care can go a long way to promote health and wellness. Talk with your health care provider about what schedule of regular examinations is right for you. This is a good chance for you to check in with your provider about disease prevention and staying healthy. In between checkups, there are plenty of things you can do on your own. Experts have done a lot of research about which lifestyle changes and preventive measures are most likely to keep you healthy. Ask your health care provider for more information. Weight and diet Eat a healthy diet  Be sure to include plenty of vegetables, fruits, low-fat dairy products, and lean protein.  Do not eat a lot of foods high in solid fats, added sugars, or salt.  Get regular exercise. This is one of the most important things you can do for your health. ? Most adults should exercise for at least 150 minutes each week. The exercise should increase your heart rate and make you sweat (moderate-intensity exercise). ? Most adults should also do strengthening exercises at least twice a week. This is in addition to the moderate-intensity exercise.  Maintain a healthy weight  Body mass index (BMI) is a measurement that can be used to identify possible weight problems. It estimates body fat based on height and weight. Your health care provider can help determine your BMI and help you  achieve or maintain a healthy weight.  For females 87 years of age and older: ? A BMI below 18.5 is considered underweight. ? A BMI of 18.5 to 24.9 is normal. ? A BMI of 25 to 29.9 is considered overweight. ? A BMI of 30 and above is considered obese.  Watch levels of cholesterol and blood lipids  You should start having your blood tested for lipids and cholesterol at 70 years of age, then have this test every 5 years.  You may need to have your cholesterol levels checked more often if: ? Your lipid or cholesterol levels are high. ? You are older than 70 years of age. ? You are at high risk for heart disease.  Cancer screening Lung Cancer  Lung cancer screening is recommended for adults 57-12 years old who are at high risk for lung cancer because of a history of smoking.  A yearly low-dose CT scan of the lungs is recommended for people who: ? Currently smoke. ? Have quit within the past 15 years. ? Have at least a 30-pack-year history of smoking. A pack year is smoking an average of one pack of cigarettes a day for 1 year.  Yearly screening should continue until it has been 15 years since you quit.  Yearly screening should stop if you develop a health problem that would prevent you from having lung cancer treatment.  Breast Cancer  Practice breast self-awareness. This means understanding how your breasts normally appear and feel.  It also means doing regular breast self-exams. Let your health care provider know about any changes, no matter how small.  If you are in your 20s or 30s, you should have a clinical breast exam (CBE) by a health care provider every 1-3 years as part of a regular health exam.  If you are 51 or older, have a CBE every year. Also consider having a breast X-ray (mammogram) every year.  If you have a family history of breast cancer, talk to your health care provider about genetic screening.  If you are at high risk for breast cancer, talk to your health  care provider about having an MRI and a mammogram every year.  Breast cancer gene (BRCA) assessment is recommended for women who have family members with BRCA-related cancers. BRCA-related cancers include: ? Breast. ? Ovarian. ? Tubal. ? Peritoneal cancers.  Results of the assessment will determine the need for genetic counseling and BRCA1 and BRCA2 testing.  Cervical Cancer Your health care provider may recommend that you be screened regularly for cancer of the pelvic organs (ovaries, uterus, and vagina). This screening involves a pelvic examination, including checking for microscopic changes to the surface of your cervix (Pap test). You may be encouraged to have this screening done every 3 years, beginning at age 37.  For women ages 72-65, health care providers may recommend pelvic exams and Pap testing every 3 years, or they may recommend the Pap and pelvic exam, combined with testing for human papilloma virus (HPV), every 5 years. Some types of HPV increase your risk of cervical cancer. Testing for HPV may also be done on women of any age with unclear Pap test results.  Other health care providers may not recommend any screening for nonpregnant women who are considered low risk for pelvic cancer and who do not have symptoms. Ask your health care provider if a screening pelvic exam is right for you.  If you have had past treatment for cervical cancer or a condition that could lead to cancer, you need Pap tests and screening for cancer for at least 20 years after your treatment. If Pap tests have been discontinued, your risk factors (such as having a new sexual partner) need to be reassessed to determine if screening should resume. Some women have medical problems that increase the chance of getting cervical cancer. In these cases, your health care provider may recommend more frequent screening and Pap tests.  Colorectal Cancer  This type of cancer can be detected and often  prevented.  Routine colorectal cancer screening usually begins at 70 years of age and continues through 71 years of age.  Your health care provider may recommend screening at an earlier age if you have risk factors for colon cancer.  Your health care provider may also recommend using home test kits to check for hidden blood in the stool.  A small camera at the end of a tube can be used to examine your colon directly (sigmoidoscopy or colonoscopy). This is done to check for the earliest forms of colorectal cancer.  Routine screening usually begins at age 18.  Direct examination of the colon should be repeated every 5-10 years through 70 years of age. However, you may need to be screened more often if early forms of precancerous polyps or small growths are found.  Skin Cancer  Check your skin from head to toe regularly.  Tell your health care provider about any new moles or changes in moles, especially if there is a  change in a mole's shape or color.  Also tell your health care provider if you have a mole that is larger than the size of a pencil eraser.  Always use sunscreen. Apply sunscreen liberally and repeatedly throughout the day.  Protect yourself by wearing long sleeves, pants, a wide-brimmed hat, and sunglasses whenever you are outside.  Heart disease, diabetes, and high blood pressure  High blood pressure causes heart disease and increases the risk of stroke. High blood pressure is more likely to develop in: ? People who have blood pressure in the high end of the normal range (130-139/85-89 mm Hg). ? People who are overweight or obese. ? People who are African American.  If you are 79-13 years of age, have your blood pressure checked every 3-5 years. If you are 37 years of age or older, have your blood pressure checked every year. You should have your blood pressure measured twice-once when you are at a hospital or clinic, and once when you are not at a hospital or clinic.  Record the average of the two measurements. To check your blood pressure when you are not at a hospital or clinic, you can use: ? An automated blood pressure machine at a pharmacy. ? A home blood pressure monitor.  If you are between 71 years and 51 years old, ask your health care provider if you should take aspirin to prevent strokes.  Have regular diabetes screenings. This involves taking a blood sample to check your fasting blood sugar level. ? If you are at a normal weight and have a low risk for diabetes, have this test once every three years after 70 years of age. ? If you are overweight and have a high risk for diabetes, consider being tested at a younger age or more often. Preventing infection Hepatitis B  If you have a higher risk for hepatitis B, you should be screened for this virus. You are considered at high risk for hepatitis B if: ? You were born in a country where hepatitis B is common. Ask your health care provider which countries are considered high risk. ? Your parents were born in a high-risk country, and you have not been immunized against hepatitis B (hepatitis B vaccine). ? You have HIV or AIDS. ? You use needles to inject street drugs. ? You live with someone who has hepatitis B. ? You have had sex with someone who has hepatitis B. ? You get hemodialysis treatment. ? You take certain medicines for conditions, including cancer, organ transplantation, and autoimmune conditions.  Hepatitis C  Blood testing is recommended for: ? Everyone born from [birth year] through 1964. ? Anyone with known risk factors for hepatitis C.  Sexually transmitted infections (STIs)  You should be screened for sexually transmitted infections (STIs) including gonorrhea and chlamydia if: ? You are sexually active and are younger than 70 years of age. ? You are older than 70 years of age and your health care provider tells you that you are at risk for this type of infection. ? Your sexual  activity has changed since you were last screened and you are at an increased risk for chlamydia or gonorrhea. Ask your health care provider if you are at risk.  If you do not have HIV, but are at risk, it may be recommended that you take a prescription medicine daily to prevent HIV infection. This is called pre-exposure prophylaxis (PrEP). You are considered at risk if: ? You are sexually active and do not regularly  use condoms or know the HIV status of your partner(s). ? You take drugs by injection. ? You are sexually active with a partner who has HIV.  Talk with your health care provider about whether you are at high risk of being infected with HIV. If you choose to begin PrEP, you should first be tested for HIV. You should then be tested every 3 months for as long as you are taking PrEP. Pregnancy  If you are premenopausal and you may become pregnant, ask your health care provider about preconception counseling.  If you may become pregnant, take 400 to 800 micrograms (mcg) of folic acid every day.  If you want to prevent pregnancy, talk to your health care provider about birth control (contraception). Osteoporosis and menopause  Osteoporosis is a disease in which the bones lose minerals and strength with aging. This can result in serious bone fractures. Your risk for osteoporosis can be identified using a bone density scan.  If you are 80 years of age or older, or if you are at risk for osteoporosis and fractures, ask your health care provider if you should be screened.  Ask your health care provider whether you should take a calcium or vitamin D supplement to lower your risk for osteoporosis.  Menopause may have certain physical symptoms and risks.  Hormone replacement therapy may reduce some of these symptoms and risks. Talk to your health care provider about whether hormone replacement therapy is right for you. Follow these instructions at home:  Schedule regular health, dental,  and eye exams.  Stay current with your immunizations.  Do not use any tobacco products including cigarettes, chewing tobacco, or electronic cigarettes.  If you are pregnant, do not drink alcohol.  If you are breastfeeding, limit how much and how often you drink alcohol.  Limit alcohol intake to no more than 1 drink per day for nonpregnant women. One drink equals 12 ounces of beer, 5 ounces of wine, or 1 ounces of hard liquor.  Do not use street drugs.  Do not share needles.  Ask your health care provider for help if you need support or information about quitting drugs.  Tell your health care provider if you often feel depressed.  Tell your health care provider if you have ever been abused or do not feel safe at home. This information is not intended to replace advice given to you by your health care provider. Make sure you discuss any questions you have with your health care provider. Document Released: 11/08/2010 Document Revised: 10/01/2015 Document Reviewed: 01/27/2015 Elsevier Interactive Patient Education  [employer].

## 2017-05-08 NOTE — Assessment & Plan Note (Addendum)
Anticipate viral given short duration. Supportive care reviewed. Continue mucinex. She has codeine cough syrup at home.

## 2017-05-08 NOTE — Assessment & Plan Note (Addendum)
Advanced directive discussion - durable POA is husband [name] - no HCPOA set up. Has living will. Full code. Does not want prolonged life support by extraordinary means. Scanned into chart 09/2016. I asked her to check at home for [facility] forms.

## 2017-05-08 NOTE — Telephone Encounter (Signed)
Pt had question about a referral to a dietician for borderline diabetes and if [facility] will pay. She is requesting a call back.

## 2017-05-09 ENCOUNTER — Encounter: Payer: Self-pay | Admitting: Family Medicine

## 2017-05-09 DIAGNOSIS — E663 Overweight: Secondary | ICD-10-CM | POA: Insufficient documentation

## 2017-05-09 DIAGNOSIS — E669 Obesity, unspecified: Secondary | ICD-10-CM

## 2017-05-09 NOTE — Assessment & Plan Note (Signed)
Sees GYN yearly - recent paps normal.

## 2017-05-09 NOTE — Assessment & Plan Note (Signed)
Preventative protocols reviewed and updated unless pt declined. Discussed healthy diet and lifestyle.  

## 2017-05-09 NOTE — Assessment & Plan Note (Addendum)
New diagnosis - progression from prediabetes. She did recently complete steroid course.  Encouraged adhering to low sugar low carb diabetic diet. Will refer to diabetes education per patient request.  RTC 6 mo DM f/u.

## 2017-05-09 NOTE — Assessment & Plan Note (Signed)
Encouraged healthy diet choices for goal sustainable weight loss.  

## 2017-05-09 NOTE — Telephone Encounter (Signed)
With progression into diabetes range this should be better covered. I have placed referral for diabetes education which includes one class with nutritionist.

## 2017-05-09 NOTE — Assessment & Plan Note (Addendum)
Chronic, continue lovastatin. LDL above new goal <100 with recent DM dx. Consider transition to more potent statin.  The 10-year ASCVD risk score Mikey Bussing DC Brooke Bonito., et al., 2013) is: 17%   Values used to calculate the score:     Age: 72 years     Sex: Female     Is Non-Hispanic African American: No     Diabetic: Yes     Tobacco smoker: No     Systolic Blood Pressure: 384 mmHg     Is BP treated: No     HDL Cholesterol: 48.1 mg/dL     Total Cholesterol: 181 mg/dL

## 2017-05-09 NOTE — Assessment & Plan Note (Addendum)
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 Today endorses more inner groin pain with flexion and abduction of left leg - will check hip films to evaluate for osteoarthritis of hip. If unrevealing, consider noncontrasted lumbar MRI for further evaluation given duration (>3 mo) and no improvement with conservative treatment (PT course, NSAIDs, robaxin, and prednisone). Pt agrees with plan.  Upcoming prolonged trip to [facility].

## 2017-05-10 DIAGNOSIS — Z01419 Encounter for gynecological examination (general) (routine) without abnormal findings: Secondary | ICD-10-CM | POA: Diagnosis not present

## 2017-05-10 DIAGNOSIS — Z6832 Body mass index (BMI) 32.0-32.9, adult: Secondary | ICD-10-CM | POA: Diagnosis not present

## 2017-05-10 NOTE — Telephone Encounter (Signed)
Called the patient and gave her the info to [facility] , she will call her insurance co about her benefits. [facility]

## 2017-05-11 DIAGNOSIS — G5602 Carpal tunnel syndrome, left upper limb: Secondary | ICD-10-CM | POA: Diagnosis not present

## 2017-05-11 DIAGNOSIS — M79605 Pain in left leg: Secondary | ICD-10-CM | POA: Diagnosis not present

## 2017-05-11 DIAGNOSIS — M545 Low back pain: Secondary | ICD-10-CM | POA: Diagnosis not present

## 2017-05-11 DIAGNOSIS — M5416 Radiculopathy, lumbar region: Secondary | ICD-10-CM | POA: Diagnosis not present

## 2017-05-12 DIAGNOSIS — M79605 Pain in left leg: Secondary | ICD-10-CM | POA: Diagnosis not present

## 2017-05-12 DIAGNOSIS — M545 Low back pain: Secondary | ICD-10-CM | POA: Diagnosis not present

## 2017-05-12 DIAGNOSIS — M5416 Radiculopathy, lumbar region: Secondary | ICD-10-CM | POA: Diagnosis not present

## 2017-05-15 DIAGNOSIS — M545 Low back pain: Secondary | ICD-10-CM | POA: Diagnosis not present

## 2017-05-15 DIAGNOSIS — M5416 Radiculopathy, lumbar region: Secondary | ICD-10-CM | POA: Diagnosis not present

## 2017-05-15 DIAGNOSIS — M79605 Pain in left leg: Secondary | ICD-10-CM | POA: Diagnosis not present

## 2017-05-16 ENCOUNTER — Other Ambulatory Visit: Payer: Self-pay | Admitting: Family Medicine

## 2017-05-16 ENCOUNTER — Telehealth: Payer: Self-pay | Admitting: Family Medicine

## 2017-05-16 DIAGNOSIS — M545 Low back pain: Secondary | ICD-10-CM

## 2017-05-16 DIAGNOSIS — M79605 Pain in left leg: Secondary | ICD-10-CM

## 2017-05-16 NOTE — Telephone Encounter (Signed)
Copied from [place] [phone]. Topic: Inquiry >> May 16, 2017  9:44 AM Malena Catholic I, NT wrote: Reason for CRM: Pt call and want Doctor Hassan Buckler know she is in pain on her L hip and if want her get MRI done.Thanks

## 2017-05-16 NOTE — Telephone Encounter (Signed)
ordered

## 2017-05-16 NOTE — Telephone Encounter (Signed)
Pt seen 05/08/17 with note possible MRI.Please advise.

## 2017-05-17 ENCOUNTER — Telehealth: Payer: Self-pay

## 2017-05-17 NOTE — Telephone Encounter (Signed)
Pt was seen on 05/08/2017.

## 2017-05-17 NOTE — Telephone Encounter (Signed)
Copied from [place] [phone]. Topic: Inquiry >> May 16, 2017  9:44 AM Malena Catholic I, NT wrote: Reason for CRM: Pt call and want Doctor Hassan Buckler know she is in pain on her L hip and if want her get MRI done.Thanks  >> May 17, 2017 12:00 PM Robina Ade, Helene Kelp D wrote: Patient called and would like to know the status of her getting an MRI. Patient is leaving next Tuesday. Please call patient back, thanks.

## 2017-05-17 NOTE — Telephone Encounter (Signed)
Ordered yesterday. Will route to [facility].

## 2017-05-18 ENCOUNTER — Telehealth: Payer: Self-pay

## 2017-05-18 DIAGNOSIS — M79605 Pain in left leg: Secondary | ICD-10-CM | POA: Diagnosis not present

## 2017-05-18 DIAGNOSIS — M5416 Radiculopathy, lumbar region: Secondary | ICD-10-CM | POA: Diagnosis not present

## 2017-05-18 DIAGNOSIS — M545 Low back pain: Secondary | ICD-10-CM | POA: Diagnosis not present

## 2017-05-18 NOTE — Telephone Encounter (Signed)
MRI scheduled and patient aware.

## 2017-05-18 NOTE — Telephone Encounter (Signed)
MRI set up, patient aware.

## 2017-05-18 NOTE — Telephone Encounter (Signed)
Copied from [place] [phone]. Topic: Inquiry >> May 18, 2017 11:30 AM Cecelia Byars, NT wrote: Reason for CRM: Patient called to follow up with information concerning a MRI ,patient would like this done before she leaves town  on Monday or Tuesday please advise  [phone]

## 2017-05-18 NOTE — Telephone Encounter (Signed)
Will forward this to [facility].

## 2017-05-18 NOTE — Telephone Encounter (Signed)
MRI scheduled at [place] location on 05/19/17 at 11:30. Authorization obtained, Order faxed and patient aware.

## 2017-05-19 ENCOUNTER — Observation Stay (HOSPITAL_COMMUNITY)
Admission: EM | Admit: 2017-05-19 | Discharge: 2017-05-21 | Disposition: A | Discharge status: Home / Self Care | Payer: Medicare HMO | Attending: Family Medicine | Admitting: Family Medicine

## 2017-05-19 ENCOUNTER — Emergency Department (HOSPITAL_COMMUNITY): Payer: Medicare HMO

## 2017-05-19 ENCOUNTER — Other Ambulatory Visit: Payer: Self-pay

## 2017-05-19 ENCOUNTER — Encounter (HOSPITAL_COMMUNITY): Payer: Self-pay | Admitting: Emergency Medicine

## 2017-05-19 DIAGNOSIS — E1151 Type 2 diabetes mellitus with diabetic peripheral angiopathy without gangrene: Secondary | ICD-10-CM | POA: Diagnosis not present

## 2017-05-19 DIAGNOSIS — R509 Fever, unspecified: Secondary | ICD-10-CM | POA: Diagnosis not present

## 2017-05-19 DIAGNOSIS — Z79899 Other long term (current) drug therapy: Secondary | ICD-10-CM | POA: Diagnosis not present

## 2017-05-19 DIAGNOSIS — Z7982 Long term (current) use of aspirin: Secondary | ICD-10-CM | POA: Diagnosis not present

## 2017-05-19 DIAGNOSIS — E785 Hyperlipidemia, unspecified: Secondary | ICD-10-CM | POA: Insufficient documentation

## 2017-05-19 DIAGNOSIS — K219 Gastro-esophageal reflux disease without esophagitis: Secondary | ICD-10-CM | POA: Diagnosis not present

## 2017-05-19 DIAGNOSIS — M1990 Unspecified osteoarthritis, unspecified site: Secondary | ICD-10-CM | POA: Insufficient documentation

## 2017-05-19 DIAGNOSIS — E119 Type 2 diabetes mellitus without complications: Secondary | ICD-10-CM

## 2017-05-19 DIAGNOSIS — R4182 Altered mental status, unspecified: Secondary | ICD-10-CM | POA: Diagnosis not present

## 2017-05-19 DIAGNOSIS — Z885 Allergy status to narcotic agent status: Secondary | ICD-10-CM | POA: Diagnosis not present

## 2017-05-19 DIAGNOSIS — J9811 Atelectasis: Secondary | ICD-10-CM | POA: Diagnosis not present

## 2017-05-19 DIAGNOSIS — R413 Other amnesia: Secondary | ICD-10-CM | POA: Diagnosis not present

## 2017-05-19 DIAGNOSIS — J069 Acute upper respiratory infection, unspecified: Secondary | ICD-10-CM | POA: Insufficient documentation

## 2017-05-19 DIAGNOSIS — Z8719 Personal history of other diseases of the digestive system: Secondary | ICD-10-CM | POA: Insufficient documentation

## 2017-05-19 DIAGNOSIS — B349 Viral infection, unspecified: Secondary | ICD-10-CM | POA: Diagnosis present

## 2017-05-19 DIAGNOSIS — R9431 Abnormal electrocardiogram [ECG] [EKG]: Secondary | ICD-10-CM | POA: Diagnosis not present

## 2017-05-19 DIAGNOSIS — I6782 Cerebral ischemia: Secondary | ICD-10-CM | POA: Insufficient documentation

## 2017-05-19 DIAGNOSIS — G9340 Encephalopathy, unspecified: Secondary | ICD-10-CM | POA: Diagnosis not present

## 2017-05-19 DIAGNOSIS — I10 Essential (primary) hypertension: Secondary | ICD-10-CM | POA: Insufficient documentation

## 2017-05-19 DIAGNOSIS — Z8249 Family history of ischemic heart disease and other diseases of the circulatory system: Secondary | ICD-10-CM | POA: Insufficient documentation

## 2017-05-19 DIAGNOSIS — G43109 Migraine with aura, not intractable, without status migrainosus: Secondary | ICD-10-CM | POA: Diagnosis not present

## 2017-05-19 DIAGNOSIS — E739 Lactose intolerance, unspecified: Secondary | ICD-10-CM | POA: Diagnosis not present

## 2017-05-19 DIAGNOSIS — E1169 Type 2 diabetes mellitus with other specified complication: Secondary | ICD-10-CM | POA: Diagnosis present

## 2017-05-19 DIAGNOSIS — E118 Type 2 diabetes mellitus with unspecified complications: Secondary | ICD-10-CM

## 2017-05-19 DIAGNOSIS — Z888 Allergy status to other drugs, medicaments and biological substances status: Secondary | ICD-10-CM | POA: Insufficient documentation

## 2017-05-19 HISTORY — DX: Type 2 diabetes mellitus without complications: E11.9

## 2017-05-19 LAB — CSF CELL COUNT WITH DIFFERENTIAL
RBC Count, CSF: 0 /mm3
RBC Count, CSF: 3 /mm3 — ABNORMAL HIGH
Tube #: 1
Tube #: 4
WBC, CSF: 2 /mm3 (ref 0–5)
WBC, CSF: 3 /mm3 (ref 0–5)

## 2017-05-19 LAB — COMPREHENSIVE METABOLIC PANEL
ALBUMIN: 4.2 g/dL (ref 3.5–5.0)
ALT: 42 U/L (ref 14–54)
AST: 31 U/L (ref 15–41)
Alkaline Phosphatase: 90 U/L (ref 38–126)
Anion gap: 10 (ref 5–15)
BILIRUBIN TOTAL: 0.8 mg/dL (ref 0.3–1.2)
BUN: 11 mg/dL (ref 6–20)
CALCIUM: 9.5 mg/dL (ref 8.9–10.3)
CO2: 26 mmol/L (ref 22–32)
Chloride: 102 mmol/L (ref 101–111)
Creatinine, Ser: 0.65 mg/dL (ref 0.44–1.00)
GFR calc Af Amer: >60 mL/min (ref >60)
GLUCOSE: 137 mg/dL — AB (ref 65–99)
Potassium: 3.9 mmol/L (ref 3.5–5.1)
Sodium: 138 mmol/L (ref 135–145)
TOTAL PROTEIN: 7.4 g/dL (ref 6.5–8.1)

## 2017-05-19 LAB — CBC
HCT: 38.2 % (ref 36.0–46.0)
Hemoglobin: 12.5 g/dL (ref 12.0–15.0)
MCH: 30 pg (ref 26.0–34.0)
MCHC: 32.7 g/dL (ref 30.0–36.0)
MCV: 91.8 fL (ref 78.0–100.0)
PLATELETS: 353 10*3/uL (ref 150–400)
RBC: 4.16 MIL/uL (ref 3.87–5.11)
RDW: 13.7 % (ref 11.5–15.5)
WBC: 8.1 10*3/uL (ref 4.0–10.5)

## 2017-05-19 LAB — CBG MONITORING, ED: GLUCOSE-CAPILLARY: 119 mg/dL — AB (ref 65–99)

## 2017-05-19 LAB — APTT: aPTT: 32 seconds (ref 24–36)

## 2017-05-19 LAB — I-STAT CG4 LACTIC ACID, ED
Lactic Acid, Venous: 1.74 mmol/L (ref 0.5–1.9)
Lactic Acid, Venous: 1.21 mmol/L (ref 0.5–1.9)

## 2017-05-19 LAB — URINALYSIS, ROUTINE W REFLEX MICROSCOPIC
BILIRUBIN URINE: NEGATIVE
GLUCOSE, UA: NEGATIVE mg/dL
HGB URINE DIPSTICK: NEGATIVE
Ketones, ur: 20 mg/dL — AB
LEUKOCYTES UA: NEGATIVE
NITRITE: NEGATIVE
Protein, ur: 100 mg/dL — AB
SPECIFIC GRAVITY, URINE: 1.015 (ref 1.005–1.030)
pH: 8 (ref 5.0–8.0)

## 2017-05-19 LAB — DIFFERENTIAL
BASOS ABS: 0 10*3/uL (ref 0.0–0.1)
BASOS PCT: 0 %
Eosinophils Absolute: 0.1 10*3/uL (ref 0.0–0.7)
Eosinophils Relative: 1 %
LYMPHS ABS: 1.9 10*3/uL (ref 0.7–4.0)
Lymphocytes Relative: 25 %
MONO ABS: 0.6 10*3/uL (ref 0.1–1.0)
MONOS PCT: 8 %
NEUTROS ABS: 5.1 10*3/uL (ref 1.7–7.7)
Neutrophils Relative %: 66 %

## 2017-05-19 LAB — INFLUENZA PANEL BY PCR (TYPE A & B)
Influenza A By PCR: NEGATIVE
Influenza B By PCR: NEGATIVE

## 2017-05-19 LAB — ETHANOL: Alcohol, Ethyl (B): <10 mg/dL (ref <10)

## 2017-05-19 LAB — RAPID URINE DRUG SCREEN, HOSP PERFORMED
Amphetamines: NOT DETECTED
Barbiturates: NOT DETECTED
Benzodiazepines: NOT DETECTED
Cocaine: NOT DETECTED
OPIATES: NOT DETECTED
TETRAHYDROCANNABINOL: NOT DETECTED

## 2017-05-19 LAB — AMMONIA: Ammonia: 58 umol/L — ABNORMAL HIGH (ref 9–35)

## 2017-05-19 LAB — PROTEIN AND GLUCOSE, CSF
Glucose, CSF: 78 mg/dL — ABNORMAL HIGH (ref 40–70)
Total  Protein, CSF: 31 mg/dL (ref 15–45)

## 2017-05-19 LAB — PROTIME-INR
INR: 1.06
Prothrombin Time: 13.8 seconds (ref 11.4–15.2)

## 2017-05-19 LAB — GLUCOSE, CAPILLARY: Glucose-Capillary: 160 mg/dL — ABNORMAL HIGH (ref 65–99)

## 2017-05-19 MED ORDER — ONDANSETRON HCL 4 MG/2ML IJ SOLN
4.0000 mg | Freq: Four times a day (QID) | INTRAMUSCULAR | Status: DC | PRN
  Filled 2017-05-19: qty 2

## 2017-05-19 MED ORDER — LIDOCAINE HCL (PF) 1 % IJ SOLN
5.0000 mL | Freq: Once | INTRAMUSCULAR | Status: AC
  Administered 2017-05-19: 5 mL
  Filled 2017-05-19: qty 5

## 2017-05-19 MED ORDER — POTASSIUM CHLORIDE IN NACL 20-0.9 MEQ/L-% IV SOLN
INTRAVENOUS | Status: DC
  Administered 2017-05-19: 19:00:00 via INTRAVENOUS
  Filled 2017-05-19 (×3): qty 1000

## 2017-05-19 MED ORDER — FLUTICASONE PROPIONATE 50 MCG/ACT NA SUSP
2.0000 | Freq: Every day | NASAL | Status: DC
  Administered 2017-05-19: 2 via NASAL
  Filled 2017-05-19: qty 16

## 2017-05-19 MED ORDER — ACETAMINOPHEN 325 MG PO TABS
650.0000 mg | ORAL_TABLET | Freq: Once | ORAL | Status: AC
  Administered 2017-05-19: 650 mg via ORAL
  Filled 2017-05-19: qty 2

## 2017-05-19 MED ORDER — VITAMIN B-12 1000 MCG PO TABS
1000.0000 ug | ORAL_TABLET | Freq: Every day | ORAL | Status: DC
  Administered 2017-05-19 – 2017-05-21 (×2): 1000 ug via ORAL
  Filled 2017-05-19 (×3): qty 1

## 2017-05-19 MED ORDER — HEPARIN SODIUM (PORCINE) 5000 UNIT/ML IJ SOLN
5000.0000 [IU] | Freq: Three times a day (TID) | INTRAMUSCULAR | Status: DC
  Administered 2017-05-19 – 2017-05-21 (×5): 5000 [IU] via SUBCUTANEOUS
  Filled 2017-05-19 (×5): qty 1

## 2017-05-19 MED ORDER — KETOROLAC TROMETHAMINE 15 MG/ML IJ SOLN
15.0000 mg | Freq: Four times a day (QID) | INTRAMUSCULAR | Status: DC | PRN
  Administered 2017-05-19: 15 mg via INTRAVENOUS
  Filled 2017-05-19: qty 1

## 2017-05-19 MED ORDER — DIPHENHYDRAMINE HCL 50 MG/ML IJ SOLN
12.5000 mg | Freq: Once | INTRAMUSCULAR | Status: AC
  Administered 2017-05-19: 12.5 mg via INTRAVENOUS
  Filled 2017-05-19: qty 1

## 2017-05-19 MED ORDER — FUROSEMIDE 20 MG PO TABS: 20.0000 mg | ORAL_TABLET | Freq: Every day | ORAL | Status: DC | PRN

## 2017-05-19 MED ORDER — ACETAMINOPHEN 325 MG PO TABS
650.0000 mg | ORAL_TABLET | Freq: Four times a day (QID) | ORAL | Status: DC | PRN
  Administered 2017-05-19 – 2017-05-20 (×3): 650 mg via ORAL
  Filled 2017-05-19 (×3): qty 2

## 2017-05-19 MED ORDER — OXYCODONE HCL 5 MG PO TABS: 5.0000 mg | ORAL_TABLET | ORAL | Status: DC | PRN

## 2017-05-19 MED ORDER — PANTOPRAZOLE SODIUM 40 MG PO TBEC
40.0000 mg | DELAYED_RELEASE_TABLET | Freq: Every day | ORAL | Status: DC
  Administered 2017-05-19 – 2017-05-21 (×3): 40 mg via ORAL
  Filled 2017-05-19 (×3): qty 1

## 2017-05-19 MED ORDER — PRAVASTATIN SODIUM 10 MG PO TABS
10.0000 mg | ORAL_TABLET | Freq: Every day | ORAL | Status: DC
  Administered 2017-05-19 – 2017-05-20 (×2): 10 mg via ORAL
  Filled 2017-05-19 (×2): qty 1

## 2017-05-19 MED ORDER — ASPIRIN 81 MG PO CHEW
81.0000 mg | CHEWABLE_TABLET | Freq: Every day | ORAL | Status: DC
  Administered 2017-05-19 – 2017-05-20 (×2): 81 mg via ORAL
  Filled 2017-05-19 (×2): qty 1

## 2017-05-19 MED ORDER — ADULT MULTIVITAMIN W/MINERALS CH
1.0000 | ORAL_TABLET | Freq: Every day | ORAL | Status: DC
  Administered 2017-05-19 – 2017-05-21 (×3): 1 via ORAL
  Filled 2017-05-19 (×5): qty 1

## 2017-05-19 MED ORDER — METOCLOPRAMIDE HCL 5 MG/ML IJ SOLN
10.0000 mg | Freq: Once | INTRAMUSCULAR | Status: AC
  Administered 2017-05-19: 10 mg via INTRAVENOUS
  Filled 2017-05-19: qty 2

## 2017-05-19 MED ORDER — ONDANSETRON HCL 4 MG PO TABS: 4.0000 mg | ORAL_TABLET | Freq: Four times a day (QID) | ORAL | Status: DC | PRN

## 2017-05-19 MED ORDER — BISACODYL 5 MG PO TBEC: 5.0000 mg | DELAYED_RELEASE_TABLET | Freq: Every day | ORAL | Status: DC | PRN

## 2017-05-19 MED ORDER — LACTULOSE 10 GM/15ML PO SOLN
20.0000 g | Freq: Once | ORAL | Status: AC
  Administered 2017-05-19: 20 g via ORAL
  Filled 2017-05-19: qty 30

## 2017-05-19 MED ORDER — METHOCARBAMOL 500 MG PO TABS: 500.0000 mg | ORAL_TABLET | Freq: Three times a day (TID) | ORAL | Status: DC | PRN

## 2017-05-19 NOTE — ED Provider Notes (Signed)
.  Lumbar Puncture Date/Time: 05/19/2017 2:43 PM Performed by: Virgel Manifold, MD Authorized by: Virgel Manifold, MD   Consent:    Consent obtained:  Written and verbal   Consent given by:  Patient   Risks discussed:  Bleeding, infection, pain, headache and nerve damage Pre-procedure details:    Procedure purpose:  Diagnostic   Preparation: Patient was prepped and draped in usual sterile fashion   Anesthesia (see MAR for exact dosages):    Anesthesia method:  Local infiltration   Local anesthetic:  Lidocaine 1% w/o epi Procedure details:    Lumbar space:  L4-L5 interspace   Patient position:  L lateral decubitus   Needle gauge:  20   Needle type:  Spinal needle - Quincke tip   Needle length (in):  3.5   Ultrasound guidance: no     Number of attempts:  1   Opening pressure (cm H2O):  15   Fluid appearance:  Clear   Tubes of fluid:  4   Total volume (ml):  5 Post-procedure:    Patient tolerance of procedure:  Tolerated well, no immediate complications   72 year old female with change in mental status.  Unclear etiology.  Since being in the emergency room, she developed a fever.  Source is not clear.  UA looks fine.  Chest x-ray clear.  Flu negative.  She is complaining of a headache.  This may just be a symptom of her febrile illness.  She has no nuchal rigidity.  LP was performed though. Will be admitted to the hospital for ongoing tx/evaluation.    Virgel Manifold, MD 05/19/17 1527

## 2017-05-19 NOTE — ED Notes (Signed)
Patient transported to CT 

## 2017-05-19 NOTE — H&P (Signed)
History and Physical    [name] BPZ:[id number] DOB: [birth year] DOA: 05/19/2017  PCP: [name], MD  Patient coming from: Home  I have personally briefly reviewed patient's old medical records in [place]  Chief Complaint: Confusion and fever with memory loss.  HPI: Dana [name] is a 72 y.o. female with medical history significant of arthritis of the right hip, controlled type 2 diabetes without complication on no medications, hyperlipidemia, gastroesophageal reflux disease, diverticulosis of the colon, and migraine headaches presents the emergency department with complaints of confusion which was noted this morning.  Last night the patient was not feeling herself she had a bit of a headache and she was speaking slowly and having some difficulty finding words.  Morning the patient awoke and was more confused she did not know the day date year time, she did not know the name of her husband.  She did not know her birthdate.  Was brought in by her husband for further evaluation and management.  On initial presentation the patient was afebrile but blood pressure was elevated at 193/196.  With watchful waiting her blood pressure has come down to a more normal range of 120-140 over 50s-60s.  Unfortunately at approximately 1054 this morning the patient spiked a temperature to 101.  She remains confused.  She does not recognize her son, she does not know the president, she does not know where she is.  Does not recognize her daughter-in-law.  She was able with some coaching to tell us her name however.  Evaluation in the emergency department has been thorough and completely unrevealing.  Urinalysis is not abnormal there is no elevated white count renal function is normal there are no signs of infection LFTs are normal she does have a mildly elevated ammonia which in the face of no history of liver disease is not helpful.  Chest x-ray was unremarkable.  She was continued to monitored in the  emergency department and did not improve.  A flu swab was obtained which was negative.  Given her relative acute amnesia with confusion and fever patient will be admitted into the hospital.  We will try to obtain a lumbar puncture in the emergency department prior to transfer upstairs to a floor bed.   Review of Systems: Patient history completely unreliable she is unable to participate due to medical illness   Past Medical History:  Diagnosis Date  . Allergy   . Anemia   . Arthritis   . Cataract   . Controlled type 2 diabetes mellitus without complication, without long-term current use of insulin ([place])   . Diverticulitis   . History of chicken pox   . Hyperlipidemia   . Kidney stone   . 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 Migraine   . Urge incontinence 04/29/2014    Past Surgical History:  Procedure Laterality Date  . ABDOMINAL HYSTERECTOMY  2006   cervix remains, menorrhagia  . APPENDECTOMY  1970  . [place]   biopsy-left  . BREAST SURGERY  1995   right milk duct removed  . CARPAL TUNNEL RELEASE Right 02/2017  . CHOLECYSTECTOMY  1971  . COLONOSCOPY  11/2005   diverticulosis o/w WNL [facility])  . DEXA  2015   spine -0.4, hip -1.0 WNL  . LITHOTRIPSY    . TUBAL LIGATION       reports that  has never smoked. she has never used smokeless tobacco. She reports that she does not drink alcohol or  use drugs.  Allergies  Allergen Reactions  . Amitriptyline Other (See Comments)    Worsened migraine and confusion  . Oxybutynin Other (See Comments)    Urinary retention/hesitancy  . Tramadol Other (See Comments)    Causes confusion and dizziness  . Niacin And Related Itching  . Septra [Sulfamethoxazole-Trimethoprim] Itching    Family History  Problem Relation Age of Onset  . Heart disease Mother 54  . Hyperlipidemia Mother   . Hyperlipidemia Father   . Stroke Father   . Heart disease Father   . Fibromyalgia Sister   . Hyperlipidemia Sister   . Heart  disease Brother   . Diabetes Brother      Prior to Admission medications   Medication Sig Start Date End Date Taking? Authorizing Provider  acetaminophen (TYLENOL) 325 MG tablet Take 650 mg by mouth every 6 (six) hours as needed for mild pain.    Yes [provider]  aspirin 81 MG tablet Take 81 mg by mouth daily.   Yes [provider]  fluticasone (FLONASE) 50 MCG/ACT nasal spray USE 2 SPRAYS IN EACH NOSTRIL EVERY DAY 05/16/17  Yes [name], MD  furosemide (LASIX) 20 MG tablet Take 1 tablet (20 mg total) by mouth daily as needed. Patient taking differently: Take 20 mg by mouth daily as needed for fluid.  05/03/16  Yes [name], MD  lovastatin (MEVACOR) 40 MG tablet TAKE 1 TABLET AT BEDTIME 05/16/17  Yes [name], MD  methocarbamol (ROBAXIN) 500 MG tablet Take 1 tablet (500 mg total) by mouth 3 (three) times daily as needed for muscle spasms. 04/10/17  Yes [name], MD  Multiple Vitamin (MULTIVITAMIN) tablet Take 1 tablet by mouth daily.   Yes [provider]  omeprazole (PRILOSEC) 20 MG capsule TAKE 1 CAPSULE EVERY DAY 05/16/17  Yes [name], MD  Turmeric Curcumin 500 MG CAPS Take 1 tablet by mouth daily.   Yes [provider]  vitamin B-12 (CYANOCOBALAMIN) 1000 MCG tablet Take 1,000 mcg by mouth daily.   Yes [provider]  nystatin cream (MYCOSTATIN) Apply topically 2 (two) times daily. Continue for 48 hours after rash resolved. Patient not taking: Reported on 05/19/2017 11/19/15   [name], MD    Physical Exam: Vitals:   05/19/17 1230 05/19/17 1240 05/19/17 1300 05/19/17 1330  BP: (!) 127/56  (!) 145/65 135/66  Pulse: 81  90 76  Resp: 11  14 12   Temp:  99.8 F (37.7 C)    TempSrc:  Oral    SpO2: 92%  94% 92%   .TCS Constitutional: NAD, calm, comfortable Vitals:   05/19/17 1230 05/19/17 1240 05/19/17 1300 05/19/17 1330  BP: (!) 127/56  (!) 145/65 135/66  Pulse: 81  90 76  Resp: 11  14  12   Temp:  99.8 F (37.7 C)    TempSrc:  Oral    SpO2: 92%  94% 92%   Eyes: PERRL, lids and conjunctivae normal ENMT: Mucous membranes are moist. Posterior pharynx clear of any exudate or lesions.Normal dentition.  Neck: normal, supple, no masses, no thyromegaly full range of motion Respiratory: clear to auscultation bilaterally, no wheezing, no crackles. Normal respiratory effort. No accessory muscle use.  Cardiovascular: Regular rate and rhythm, no murmurs / rubs / gallops. No extremity edema. 2+ pedal pulses. No carotid bruits.  Abdomen: no tenderness, no masses palpated. No hepatosplenomegaly. Bowel sounds positive.  Musculoskeletal: no clubbing / cyanosis. No joint deformity upper and lower extremities. Good ROM, no contractures. Normal  muscle tone.  Skin: no rashes, lesions, ulcers. No induration, warm and dry Neurologic: CN 2-12 grossly intact. Sensation intact, DTR normal. Strength 5/5 in all 4.  Negative straight leg raise negative discomfort with range of motion of the neck. Psychiatric: Normal judgment and insight. Alert and oriented x 3. Normal mood.    Labs on Admission: I have personally reviewed following labs and imaging studies  CBC: Recent Labs  Lab 05/19/17 0249  WBC 8.1  NEUTROABS 5.1  HGB 12.5  HCT 38.2  MCV 91.8  PLT 357   Basic Metabolic Panel: Recent Labs  Lab 05/19/17 0249  NA 138  K 3.9  CL 102  CO2 26  GLUCOSE 137*  BUN 11  CREATININE 0.65  CALCIUM 9.5   GFR: Estimated Creatinine Clearance: 66 mL/min (by C-G formula based on SCr of 0.65 mg/dL). Liver Function Tests: Recent Labs  Lab 05/19/17 0249  AST 31  ALT 42  ALKPHOS 90  BILITOT 0.8  PROT 7.4  ALBUMIN 4.2   No results for input(s): LIPASE, AMYLASE in the last 168 hours. Recent Labs  Lab 05/19/17 0908  AMMONIA 58*   Coagulation Profile: Recent Labs  Lab 05/19/17 0852  INR 1.06   Cardiac Enzymes: No results for input(s): CKTOTAL, CKMB, CKMBINDEX, TROPONINI in the last  168 hours. BNP (last 3 results) No results for input(s): PROBNP in the last 8760 hours. HbA1C: No results for input(s): HGBA1C in the last 72 hours. CBG: Recent Labs  Lab 05/19/17 0249  GLUCAP 119*   Lipid Profile: No results for input(s): CHOL, HDL, LDLCALC, TRIG, CHOLHDL, LDLDIRECT in the last 72 hours. Thyroid Function Tests: No results for input(s): TSH, T4TOTAL, FREET4, T3FREE, THYROIDAB in the last 72 hours. Anemia Panel: No results for input(s): VITAMINB12, FOLATE, FERRITIN, TIBC, IRON, RETICCTPCT in the last 72 hours. Urine analysis:    Component Value Date/Time   COLORURINE YELLOW 05/19/2017 0718   APPEARANCEUR CLEAR 05/19/2017 0718   LABSPEC 1.015 05/19/2017 0718   PHURINE 8.0 05/19/2017 0718   GLUCOSEU NEGATIVE 05/19/2017 0718   HGBUR NEGATIVE 05/19/2017 0718   BILIRUBINUR NEGATIVE 05/19/2017 0718   BILIRUBINUR Negative 12/01/2015 1552   KETONESUR 20 (A) 05/19/2017 0718   PROTEINUR 100 (A) 05/19/2017 0718   UROBILINOGEN 0.2 12/01/2015 1552   UROBILINOGEN 0.2 11/13/2014 1005   NITRITE NEGATIVE 05/19/2017 0718   LEUKOCYTESUR NEGATIVE 05/19/2017 0718    Radiological Exams on Admission: Ct Head Wo Contrast  Result Date: 05/19/2017 CLINICAL DATA:  Altered mental status and slurred speech EXAM: CT HEAD WITHOUT CONTRAST TECHNIQUE: Contiguous axial images were obtained from the base of the skull through the vertex without intravenous contrast. COMPARISON:  None. FINDINGS: Brain: No mass lesion, intraparenchymal hemorrhage or extra-axial collection. No evidence of acute cortical infarct. Hyperdense focus in the right cerebellum is favored to be calcification. There is bilateral basal ganglia mineralization. There is periventricular hypoattenuation compatible with chronic microvascular disease. Vascular: No hyperdense vessel or unexpected calcification. Skull: Normal visualized skull base, calvarium and extracranial soft tissues. Sinuses/Orbits: No sinus fluid levels or  advanced mucosal thickening. No mastoid effusion. Normal orbits. IMPRESSION: No acute intracranial abnormality. Findings of mild chronic small vessel disease. Electronically Signed   By: Ulyses Jarred M.D.   On: 05/19/2017 06:32   Dg Chest Portable 1 View  Result Date: 05/19/2017 CLINICAL DATA:  Mental status changes. EXAM: PORTABLE CHEST 1 VIEW COMPARISON:  None. FINDINGS: The heart is mildly enlarged with a left ventricular configuration. Mild tortuosity and calcification of the  thoracic aorta. Low lung volumes with vascular crowding and bibasilar atelectasis. No definite infiltrates or effusions. The bony thorax is intact. IMPRESSION: Cardiac enlargement. Low lung volumes with vascular crowding and bibasilar atelectasis. No definite infiltrates, edema or effusions. Electronically Signed   By: Marijo Sanes M.D.   On: 05/19/2017 12:34    EKG: Independently reviewed.  Normal sinus rhythm normal EKG  Assessment/Plan Principal Problem:   Amnesia memory loss Active Problems:   Fever   Acute viral syndrome   Controlled type 2 diabetes mellitus without complication, without long-term current use of insulin (HCC)   HLD (hyperlipidemia)   GERD (gastroesophageal reflux disease)   1.  Amnesia with memory loss: The patient presents with a change in her mental status she does not recognize any of her family and is unable to tell us her name.  She has a metabolic encephalopathy which is confusing and it presentation as amnesia.  I believe that this may be associated with her fever however the confusion began prior to the fever.  Her neurological examination is completely is unremarkable her viral studies for flu are negative she has no localizing findings.  We will proceed with lumbar puncture to evaluate for possible viral meningitis.  2.  Fever: May be secondary to a viral meningitis.  Otherwise this would be an acute viral syndrome.  Another possibility is that the patient has had some hip pain while  ambulating and was scheduled for an MRI of the hip today.  There is an outside chance that she could have a hip infection that is manifesting itself.  3.  Acute viral syndrome.  Likely acute viral syndrome.  Currently no evidence of viral gastroenteritis or viral upper respiratory infection will continue to monitor.  4.  Controlled type 2 diabetes mellitus without complication without medication management.  Continue current management which at this point is diet control.  5.  Hyperlipidemia.  Continue home medications.  6.  Gastroesophageal reflux disease continue home medications.  DVT prophylaxis: Subcu heparin Code Status: Full code Family Communication: Spoke with patient's son and daughter-in-law at the bedside at length. Disposition Plan: Likely home in 3-4 days. Consults called: None at the present time. Admission status: Inpatient   Lady Deutscher MD FACP [facility] Pager [phone]  If 7PM-7AM, please contact night-coverage [email or web address] Password [email or web address]  05/19/2017, 2:13 PM

## 2017-05-19 NOTE — ED Notes (Signed)
ED Provider at bedside. 

## 2017-05-19 NOTE — ED Triage Notes (Signed)
Patient arrived with spouse reports confusion/disorientation onset yesterday noon with emesis x1 and fatigue this evening  , patient is confused/disoriented at arrival , hypertensive /respirations unlabored.

## 2017-05-19 NOTE — ED Provider Notes (Signed)
[facility] EMERGENCY DEPARTMENT Provider Note   CSN: [id number] Arrival date & time: 05/19/17  0230     History   Chief Complaint Chief Complaint  Patient presents with  . Altered Mental Status   Level 5 caveat due to altered mental status HPI Dana [name] is a 71 y.o. female.  The history is provided by the patient and the spouse. The history is limited by the condition of the patient.  Altered Mental Status   This is a new problem. The current episode started 6 to 12 hours ago. The problem has not changed since onset.Associated symptoms include confusion.  Husband reports that around dinnertime last night patient began becoming confused and reporting headache She had an episode of emesis Her confusion disorientation has continued No falls or injuries reported, no fevers reported Also reports similar episode happened previously when they were in [place], was told she had a migraine Past Medical History:  Diagnosis Date  . Allergy   . Anemia   . Arthritis   . Cataract   . Controlled type 2 diabetes mellitus without complication, without long-term current use of insulin ([place])   . Diverticulitis   . History of chicken pox   . Hyperlipidemia   . Kidney stone   . 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 Migraine   . Urge incontinence 04/29/2014    Patient Active Problem List   Diagnosis Date Noted  . Obesity, Class I, BMI 30.0-34.9 (see actual BMI) 05/09/2017  . Acute respiratory infection 05/08/2017  . Lumbar pain with radiation down left leg 02/01/2017  . Left shoulder pain 09/30/2016  . Acute sinusitis 08/09/2016  . Medicare annual wellness visit, subsequent 05/03/2016  . 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 Kidney stone   . Cervical pain (neck) 12/04/2015  . Encounter for well adult exam with abnormal findings 04/29/2014  . Right hand paresthesia 04/29/2014  . Advanced care planning/counseling discussion 04/29/2014  . PFD (pelvic floor dysfunction)  04/29/2014  . Controlled type 2 diabetes mellitus without complication, without long-term current use of insulin ([place]) 12/24/2013  . 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  . Hx of diverticulitis of colon 11/07/2012  . Allergic rhinitis 11/07/2012  . HLD (hyperlipidemia) 11/07/2012  . Migraine 11/07/2012  . Hereditary and idiopathic peripheral neuropathy 11/07/2012  . LGSIL Pap smear of vaginal cuff 11/07/2012  . GERD (gastroesophageal reflux disease) 11/07/2012  . Family history of premature coronary heart disease 11/07/2012    Past Surgical History:  Procedure Laterality Date  . ABDOMINAL HYSTERECTOMY  2006   cervix remains, menorrhagia  . APPENDECTOMY  1970  . [place]   biopsy-left  . BREAST SURGERY  1995   right milk duct removed  . CARPAL TUNNEL RELEASE Right 02/2017  . CHOLECYSTECTOMY  1971  . COLONOSCOPY  11/2005   diverticulosis o/w WNL [facility])  . DEXA  2015   spine -0.4, hip -1.0 WNL  . LITHOTRIPSY    . TUBAL LIGATION      OB History    No data available       Home Medications    Prior to Admission medications   Medication Sig Start Date End Date Taking? Authorizing Provider  acetaminophen (TYLENOL) 325 MG tablet Take 650 mg by mouth every 6 (six) hours as needed for mild pain.    Yes [provider]  aspirin 81 MG tablet Take 81 mg by mouth daily.   Yes [provider]  fluticasone (FLONASE) 50  MCG/ACT nasal spray USE 2 SPRAYS IN EACH NOSTRIL EVERY DAY 05/16/17  Yes [name], MD  furosemide (LASIX) 20 MG tablet Take 1 tablet (20 mg total) by mouth daily as needed. Patient taking differently: Take 20 mg by mouth daily as needed for fluid.  05/03/16  Yes [name], MD  lovastatin (MEVACOR) 40 MG tablet TAKE 1 TABLET AT BEDTIME 05/16/17  Yes [name], MD  methocarbamol (ROBAXIN) 500 MG tablet Take 1 tablet (500 mg total) by mouth 3 (three) times daily as needed for muscle spasms. 04/10/17  Yes [name],  MD  Multiple Vitamin (MULTIVITAMIN) tablet Take 1 tablet by mouth daily.   Yes [provider]  omeprazole (PRILOSEC) 20 MG capsule TAKE 1 CAPSULE EVERY DAY 05/16/17  Yes [name], MD  Turmeric Curcumin 500 MG CAPS Take 1 tablet by mouth daily.   Yes [provider]  vitamin B-12 (CYANOCOBALAMIN) 1000 MCG tablet Take 1,000 mcg by mouth daily.   Yes [provider]  nystatin cream (MYCOSTATIN) Apply topically 2 (two) times daily. Continue for 48 hours after rash resolved. Patient not taking: Reported on 05/19/2017 11/19/15   [name], MD    Family History Family History  Problem Relation Age of Onset  . Heart disease Mother 35  . Hyperlipidemia Mother   . Hyperlipidemia Father   . Stroke Father   . Heart disease Father   . Fibromyalgia Sister   . Hyperlipidemia Sister   . Heart disease Brother   . Diabetes Brother     Social History Social History   Tobacco Use  . Smoking status: Never Smoker  . Smokeless tobacco: Never Used  Substance Use Topics  . Alcohol use: No    Alcohol/week: 0.0 oz  . Drug use: No     Allergies   Amitriptyline; Oxybutynin; Tramadol; Niacin and related; and Septra [sulfamethoxazole-trimethoprim]   Review of Systems Review of Systems  Unable to perform ROS: Mental status change  Psychiatric/Behavioral: Positive for confusion.     Physical Exam Updated Vital Signs BP (!) 151/72   Pulse 77   Temp 98.1 F (36.7 C) (Oral)   Resp 11   SpO2 96%   Physical Exam CONSTITUTIONAL: Elderly, resting with eyes closed HEAD: Normocephalic/atraumatic EYES: EOMI/PERRL, no nystagmus ENMT: Mucous membranes moist NECK: supple no meningeal signs SPINE/BACK:entire spine nontender CV: S1/S2 noted, no murmurs/rubs/gallops noted LUNGS: Lungs are clear to auscultation bilaterally, no apparent distress ABDOMEN: soft, nontender, no rebound or guarding, bowel sounds noted throughout abdomen GU:no cva tenderness NEURO:  Pt is somnolent, will wake up to voice at times, will follow some commands and drift back to sleep.  When asked if she has a headache she reports "sure" no gross facial droop, no arm or leg drift EXTREMITIES: pulses normal/equal, full ROM SKIN: warm, color normal PSYCH: unable to assess  ED Treatments / Results  Labs (all labs ordered are listed, but only abnormal results are displayed) Labs Reviewed  COMPREHENSIVE METABOLIC PANEL - Abnormal; Notable for the following components:      Result Value   Glucose, Bld 137 (*)    All other components within normal limits  CBC  ETHANOL  PROTIME-INR  APTT  RAPID URINE DRUG SCREEN, HOSP PERFORMED  URINALYSIS, ROUTINE W REFLEX MICROSCOPIC  AMMONIA  DIFFERENTIAL  CBG MONITORING, ED  I-STAT CG4 LACTIC ACID, ED    EKG  EKG Interpretation  Date/Time:  Friday May 19 2017 02:42:59 EST Ventricular Rate:  89 PR Interval:  134 QRS  Duration: 82 QT Interval:  380 QTC Calculation: 462 R Axis:   81 Text Interpretation:  Normal sinus rhythm Nonspecific T wave abnormality Abnormal ECG Confirmed by Ripley Fraise [phone]) on 05/19/2017 5:46:59 AM       Radiology Ct Head Wo Contrast  Result Date: 05/19/2017 CLINICAL DATA:  Altered mental status and slurred speech EXAM: CT HEAD WITHOUT CONTRAST TECHNIQUE: Contiguous axial images were obtained from the base of the skull through the vertex without intravenous contrast. COMPARISON:  None. FINDINGS: Brain: No mass lesion, intraparenchymal hemorrhage or extra-axial collection. No evidence of acute cortical infarct. Hyperdense focus in the right cerebellum is favored to be calcification. There is bilateral basal ganglia mineralization. There is periventricular hypoattenuation compatible with chronic microvascular disease. Vascular: No hyperdense vessel or unexpected calcification. Skull: Normal visualized skull base, calvarium and extracranial soft tissues. Sinuses/Orbits: No sinus fluid levels or  advanced mucosal thickening. No mastoid effusion. Normal orbits. IMPRESSION: No acute intracranial abnormality. Findings of mild chronic small vessel disease. Electronically Signed   By: Ulyses Jarred M.D.   On: 05/19/2017 06:32    Procedures Procedures  Medications Ordered in ED Medications  metoCLOPramide (REGLAN) injection 10 mg (10 mg Intravenous Given 05/19/17 0657)  diphenhydrAMINE (BENADRYL) injection 12.5 mg (12.5 mg Intravenous Given 05/19/17 0655)     Initial Impression / Assessment and Plan / ED Course  I have reviewed the triage vital signs and the nursing notes.  Pertinent labs  results that were available during my care of the patient were reviewed by me and considered in my medical decision making (see chart for details).     tPA in stroke considered but not given due to: Onset over 3-4.5hours  7:53 AM Patient with encephalopathy of unclear etiology She has no focal weakness as far as I can tell on exam She is still resting with her eyes closed when asked her how she feels, she reports "it hurts" and points to her abdomen, but no focal tenderness She is afebrile, no meningeal signs, white count normal suspicion for meningitis is low Labs and urinalysis still pending at this time Signed out to Dr. Wilson Singer with labs pending and will need to be admitted   Final Clinical Impressions(s) / ED Diagnoses   Final diagnoses:  Encephalopathy    ED Discharge Orders    None       Ripley Fraise, MD 05/19/17 [phone]

## 2017-05-19 NOTE — ED Notes (Signed)
Son signed consent and it is at the bedside.

## 2017-05-19 NOTE — ED Notes (Signed)
Pt assisted to bedside commode.  Very unstable and needed assistance to stand.  Remains photophobic.

## 2017-05-20 ENCOUNTER — Inpatient Hospital Stay (HOSPITAL_COMMUNITY): Payer: Medicare HMO

## 2017-05-20 DIAGNOSIS — R410 Disorientation, unspecified: Secondary | ICD-10-CM | POA: Diagnosis not present

## 2017-05-20 DIAGNOSIS — R413 Other amnesia: Secondary | ICD-10-CM | POA: Diagnosis not present

## 2017-05-20 LAB — COMPREHENSIVE METABOLIC PANEL
ALBUMIN: 3.5 g/dL (ref 3.5–5.0)
ALK PHOS: 72 U/L (ref 38–126)
ALT: 31 U/L (ref 14–54)
ANION GAP: 7 (ref 5–15)
AST: 22 U/L (ref 15–41)
BUN: 12 mg/dL (ref 6–20)
CALCIUM: 8.8 mg/dL — AB (ref 8.9–10.3)
CHLORIDE: 110 mmol/L (ref 101–111)
CO2: 24 mmol/L (ref 22–32)
Creatinine, Ser: 0.69 mg/dL (ref 0.44–1.00)
GFR calc non Af Amer: >60 mL/min (ref >60)
GLUCOSE: 107 mg/dL — AB (ref 65–99)
Potassium: 4 mmol/L (ref 3.5–5.1)
SODIUM: 141 mmol/L (ref 135–145)
Total Bilirubin: 1.1 mg/dL (ref 0.3–1.2)
Total Protein: 6.4 g/dL — ABNORMAL LOW (ref 6.5–8.1)

## 2017-05-20 LAB — CBC
HCT: 37 % (ref 36.0–46.0)
HEMOGLOBIN: 12 g/dL (ref 12.0–15.0)
MCH: 30.2 pg (ref 26.0–34.0)
MCHC: 32.4 g/dL (ref 30.0–36.0)
MCV: 93 fL (ref 78.0–100.0)
PLATELETS: 293 10*3/uL (ref 150–400)
RBC: 3.98 MIL/uL (ref 3.87–5.11)
RDW: 13.9 % (ref 11.5–15.5)
WBC: 7.1 10*3/uL (ref 4.0–10.5)

## 2017-05-20 MED ORDER — ASPIRIN-ACETAMINOPHEN-CAFFEINE 250-250-65 MG PO TABS
1.0000 | ORAL_TABLET | Freq: Four times a day (QID) | ORAL | Status: DC | PRN
  Filled 2017-05-20: qty 1

## 2017-05-20 MED ORDER — ACETAMINOPHEN 500 MG PO TABS
1000.0000 mg | ORAL_TABLET | Freq: Four times a day (QID) | ORAL | Status: DC | PRN
  Administered 2017-05-20 – 2017-05-21 (×2): 1000 mg via ORAL
  Filled 2017-05-20 (×4): qty 2

## 2017-05-20 NOTE — Progress Notes (Addendum)
PROGRESS NOTE [facility]   Dana [name]   TWS:[id number] DOB: 12/09/1945  DOA: 05/19/2017 PCP: [name], MD   Brief Narrative:  Dana [name] is a 72 year old female with medical history significant for migraines, type 2 diabetes mellitus, hypertension, arthritis who presented to the emergency department complaining of memory loss and headache.  Patient tells me that she was having headache without any relief despite medication and still feeling some numbness sensation on the arms and legs.  Subsequently she could not remember the names of her family neither he could not remember time of the year.  She reported that she could not get her words out but she knew what to say.  Denies facial droopiness, and facial numbness.  Patient had an episode like this many years ago that resolved and was felt to be related to migraine.  She gets migraines every once in a while and normally they go away with Tylenol.  Patient also reported that she has been suffering  from a viral bronchitis for the past week or so.  Upon ED evaluation patient was confused with elevated blood pressure and severe headache.  She had a febrile episode with a temperature of 101 therefore lumbar puncture was performed.  Patient was admitted for further workup.  CT of the [name] was negative for acute findings.  Subjective: Patient seen and examined, she is back to baseline.  Still have some mild headache but took Tylenol and is improved.  Mentally at baseline.  Assessment & Plan: Complicated migraine Symptoms compatible with migraine with neurological findings.  Headache has improved  and symptoms has resolved.  Still will need to rule out stroke.  I have ordered MRI of the [name].  For now patient would like to continue to treat pain with Tylenol. Carefull with NSAIs after lumbar puncture.  LP analysis was normal. Will add Excedrin for migraines    Viral URI Hydration Tylenol for fever Flu PCR negative - can d/c  droplet precautions   Diabetes mellitus type 2 Well-controlled Continue current management which is diet controlled  GERD continue home medication  DVT prophylaxis: Subcu heparin Code Status: Full code Family Communication: Husband at bedside Disposition Plan: Home in the morning if MRI negative  Consultants:   None  Procedures:   LP 05/19/2017  Antimicrobials:  None   Objective: Vitals:   05/19/17 1630 05/19/17 2118 05/20/17 0503 05/20/17 1454  BP: (!) 142/69 (!) 129/55 (!) 140/55 (!) 147/55  Pulse: 71 68 71 71  Resp: 11 18 18 18   Temp:  98.2 F (36.8 C) 98 F (36.7 C) 97.7 F (36.5 C)  TempSrc:  Oral Oral Oral  SpO2: 94% 96% 98% 97%    Intake/Output Summary (Last 24 hours) at 05/20/2017 1700 Last data filed at 05/20/2017 1400 Gross per 24 hour  Intake 2502.5 ml  Output 1000 ml  Net 1502.5 ml   There were no vitals filed for this visit.  Examination:  General exam: Appears calm and comfortable  HEENT: AC/AT, PERRLA, OP moist and clear Respiratory system: Clear to auscultation. No wheezes,crackle or rhonchi Cardiovascular system: S1 & S2 heard, RRR. No JVD, murmurs, rubs or gallops Gastrointestinal system: Abdomen is nondistended, soft and nontender. No organomegaly or masses felt. Normal bowel sounds heard. Central nervous system: Alert and oriented. No focal neurological deficits. Extremities: No pedal edema. Symmetric, strength 5/5   Skin: No rashes, lesions or ulcers Psychiatry: Judgement and insight appear normal. Mood & affect appropriate.    Data Reviewed:  I have personally reviewed following labs and imaging studies  CBC: Recent Labs  Lab 05/19/17 0249 05/20/17 0416  WBC 8.1 7.1  NEUTROABS 5.1  --   HGB 12.5 12.0  HCT 38.2 37.0  MCV 91.8 93.0  PLT 353 270   Basic Metabolic Panel: Recent Labs  Lab 05/19/17 0249 05/20/17 0416  NA 138 141  K 3.9 4.0  CL 102 110  CO2 26 24  GLUCOSE 137* 107*  BUN 11 12  CREATININE 0.65 0.69    CALCIUM 9.5 8.8*   GFR: Estimated Creatinine Clearance: 66 mL/min (by C-G formula based on SCr of 0.69 mg/dL). Liver Function Tests: Recent Labs  Lab 05/19/17 0249 05/20/17 0416  AST 31 22  ALT 42 31  ALKPHOS 90 72  BILITOT 0.8 1.1  PROT 7.4 6.4*  ALBUMIN 4.2 3.5   No results for input(s): LIPASE, AMYLASE in the last 168 hours. Recent Labs  Lab 05/19/17 0908  AMMONIA 58*   Coagulation Profile: Recent Labs  Lab 05/19/17 0852  INR 1.06   Cardiac Enzymes: No results for input(s): CKTOTAL, CKMB, CKMBINDEX, TROPONINI in the last 168 hours. BNP (last 3 results) No results for input(s): PROBNP in the last 8760 hours. HbA1C: No results for input(s): HGBA1C in the last 72 hours. CBG: Recent Labs  Lab 05/19/17 0249 05/19/17 2325  GLUCAP 119* 160*   Lipid Profile: No results for input(s): CHOL, HDL, LDLCALC, TRIG, CHOLHDL, LDLDIRECT in the last 72 hours. Thyroid Function Tests: No results for input(s): TSH, T4TOTAL, FREET4, T3FREE, THYROIDAB in the last 72 hours. Anemia Panel: No results for input(s): VITAMINB12, FOLATE, FERRITIN, TIBC, IRON, RETICCTPCT in the last 72 hours. Sepsis Labs: Recent Labs  Lab 05/19/17 0756 05/19/17 1046  LATICACIDVEN 1.74 1.21    Recent Results (from the past 240 hour(s))  Blood culture (routine x 2)     Status: None (Preliminary result)   Collection Time: 05/19/17 11:56 AM  Result Value Ref Range Status   Specimen Description BLOOD BLOOD RIGHT HAND  Final   Special Requests IN PEDIATRIC BOTTLE Blood Culture adequate volume  Final   Culture NO GROWTH < 24 HOURS  Final   Report Status PENDING  Incomplete  Blood culture (routine x 2)     Status: None (Preliminary result)   Collection Time: 05/19/17 12:04 PM  Result Value Ref Range Status   Specimen Description BLOOD BLOOD LEFT HAND  Final   Special Requests   Final    BOTTLES DRAWN AEROBIC AND ANAEROBIC Blood Culture adequate volume   Culture NO GROWTH < 24 HOURS  Final   Report  Status PENDING  Incomplete  CSF culture     Status: None (Preliminary result)   Collection Time: 05/19/17  2:23 PM  Result Value Ref Range Status   Specimen Description FLUID CSF  Final   Special Requests NONE  Final   Gram Stain   Final    WBC PRESENT, PREDOMINANTLY PMN NO ORGANISMS SEEN CYTOSPIN SMEAR    Culture NO GROWTH 1 DAY  Final   Report Status PENDING  Incomplete      Radiology Studies: Ct [name] Wo Contrast  Result Date: 05/19/2017 CLINICAL DATA:  Altered mental status and slurred speech EXAM: CT [name] WITHOUT CONTRAST TECHNIQUE: Contiguous axial images were obtained from the base of the skull through the vertex without intravenous contrast. COMPARISON:  None. FINDINGS: Brain: No mass lesion, intraparenchymal hemorrhage or extra-axial collection. No evidence of acute cortical infarct. Hyperdense focus in the right cerebellum  is favored to be calcification. There is bilateral basal ganglia mineralization. There is periventricular hypoattenuation compatible with chronic microvascular disease. Vascular: No hyperdense vessel or unexpected calcification. Skull: Normal visualized skull base, calvarium and extracranial soft tissues. Sinuses/Orbits: No sinus fluid levels or advanced mucosal thickening. No mastoid effusion. Normal orbits. IMPRESSION: No acute intracranial abnormality. Findings of mild chronic small vessel disease. Electronically Signed   By: Ulyses Jarred M.D.   On: 05/19/2017 06:32   Dg Chest Portable 1 View  Result Date: 05/19/2017 CLINICAL DATA:  Mental status changes. EXAM: PORTABLE CHEST 1 VIEW COMPARISON:  None. FINDINGS: The heart is mildly enlarged with a left ventricular configuration. Mild tortuosity and calcification of the thoracic aorta. Low lung volumes with vascular crowding and bibasilar atelectasis. No definite infiltrates or effusions. The bony thorax is intact. IMPRESSION: Cardiac enlargement. Low lung volumes with vascular crowding and bibasilar atelectasis.  No definite infiltrates, edema or effusions. Electronically Signed   By: Marijo Sanes M.D.   On: 05/19/2017 12:34      Scheduled Meds: . fluticasone  2 spray Each Nare Daily  . heparin  5,000 Units Subcutaneous Q8H  . multivitamin with minerals  1 tablet Oral Daily  . pantoprazole  40 mg Oral Daily  . pravastatin  10 mg Oral q1800  . vitamin B-12  1,000 mcg Oral Daily   Continuous Infusions:   LOS: 1 day    Time spent: Total of 35 minutes spent with pt, greater than 50% of which was spent in discussion of  treatment, counseling and coordination of care  Chipper Oman, MD Pager: Text Page via [email or web address]   If 7PM-7AM, please contact night-coverage [email or web address] 05/20/2017, 5:00 PM

## 2017-05-21 DIAGNOSIS — R413 Other amnesia: Secondary | ICD-10-CM | POA: Diagnosis not present

## 2017-05-21 MED ORDER — ASPIRIN-ACETAMINOPHEN-CAFFEINE 250-250-65 MG PO TABS: 1.0000 | ORAL_TABLET | Freq: Four times a day (QID) | ORAL | 0 refills | Status: DC | PRN

## 2017-05-21 MED ORDER — FUROSEMIDE 20 MG PO TABS: 20.0000 mg | ORAL_TABLET | Freq: Every day | ORAL | Status: DC | PRN

## 2017-05-21 NOTE — Discharge Summary (Signed)
Physician Discharge Summary  Dana [name]  NFA:[id number]  DOB: [birth year]  DOA: 05/19/2017 PCP: [name], MD  Admit date: 05/19/2017 Discharge date: 05/21/2017  Admitted From: Home  Disposition:  Home   Recommendations for Outpatient Follow-up:  1. Follow up with PCP in 1-2 weeks 2. Please obtain Ammonia levels in 1 week, while in hospital slight elevated  3. Please follow up on the following pending results: Final blood and CFS cultures so far negative   Discharge Condition: Stable CODE STATUS: Full code  Diet recommendation: Heart Healthy   Brief/Interim Summary: For full details see H&P/progress note but in brief Dana [name] is a 72 year old female with medical history significant for migraines, type 2 diabetes mellitus, hypertension, arthritis who presented to the emergency department complaining of memory loss and headache.  Patient tells me that she was having headache without any relief despite medication and still feeling some numbness sensation on the arms and legs.  Subsequently she could not remember the names of her family neither he could not remember time of the year.  She reported that she could not get her words out but she knew what to say.  Denies facial droopiness, and facial numbness.  Patient had an episode like this many years ago that resolved and was felt to be related to migraine.  She gets migraines every once in a while and normally they go away with Tylenol.  Patient also reported that she has been suffering  from a viral bronchitis for the past week or so.  Upon ED evaluation patient was confused with elevated blood pressure and severe headache.  She had a febrile episode with a temperature of 101 therefore lumbar puncture was performed.  Patient was admitted for further workup.  CT of the head was negative for acute findings.  Headaches improved and neurologic symptoms have resolved. LP normal.  MRI of the brain does not show any significant findings.  Patient back to baseline, remains afebrile and was deemed stable to be discharge home.   Subjective: Patient seen and examined, mild headaches but improved with Excedrin. Denies blurry vision, weakness, difficulty with speaking and memory loss. Patient can recall everything that has happened. No chest pain or SOB.    Discharge Diagnoses/Hospital Course:  Complicated migraine - Resolved  Symptoms compatible with migraine with neurological findings. Headache has improved  and symptoms has resolved.  LP Negative, MRI with no acute changes. Migraine is not that often for medical prophylaxis at this time. Recommended to continue Excedrin Migraine PRN and follow up with PCP.    Viral URI Supportive treatment  Patient afebrile  Continue Flonase  Tylenol for fever Flu PCR negative   Diabetes mellitus type 2 Well-controlled Continue current management which is diet controlled  GERD continue home medication  All other chronic medical condition were stable during the hospitalization.  On the day of the discharge the patient's vitals were stable, and no other acute medical condition were reported by patient. the patient was felt safe to be discharge to home   Discharge Instructions  You were cared for by a hospitalist during your hospital stay. If you have any questions about your discharge medications or the care you received while you were in the hospital after you are discharged, you can call the unit and asked to speak with the hospitalist on call if the hospitalist that took care of you is not available. Once you are discharged, your primary care physician will handle any further medical issues. Please  note that NO REFILLS for any discharge medications will be authorized once you are discharged, as it is imperative that you return to your primary care physician (or establish a relationship with a primary care physician if you do not have one) for your aftercare needs so that they can reassess  your need for medications and monitor your lab values.  Discharge Instructions    Call MD for:  difficulty breathing, headache or visual disturbances   Complete by:  As directed    Call MD for:  extreme fatigue   Complete by:  As directed    Call MD for:  hives   Complete by:  As directed    Call MD for:  persistant dizziness or light-headedness   Complete by:  As directed    Call MD for:  persistant nausea and vomiting   Complete by:  As directed    Call MD for:  redness, tenderness, or signs of infection (pain, swelling, redness, odor or green/yellow discharge around incision site)   Complete by:  As directed    Call MD for:  severe uncontrolled pain   Complete by:  As directed    Call MD for:  temperature >100.4   Complete by:  As directed    Diet - low sodium heart healthy   Complete by:  As directed    Increase activity slowly   Complete by:  As directed      Allergies as of 05/21/2017      Reactions   Amitriptyline Other (See Comments)   Worsened migraine and confusion   Oxybutynin Other (See Comments)   Urinary retention/hesitancy   Tramadol Other (See Comments)   Causes confusion and dizziness   Niacin And Related Itching   Septra [sulfamethoxazole-trimethoprim] Itching      Medication List    STOP taking these medications   nystatin cream Commonly known as:  MYCOSTATIN   Turmeric Curcumin 500 MG Caps     TAKE these medications   aspirin 81 MG tablet Take 81 mg by mouth daily.   aspirin-acetaminophen-caffeine 250-250-65 MG tablet Commonly known as:  EXCEDRIN MIGRAINE Take 1 tablet by mouth every 6 (six) hours as needed for headache or migraine.   fluticasone 50 MCG/ACT nasal spray Commonly known as:  FLONASE USE 2 SPRAYS IN EACH NOSTRIL EVERY DAY   furosemide 20 MG tablet Commonly known as:  LASIX Take 1 tablet (20 mg total) by mouth daily as needed for fluid.   lovastatin 40 MG tablet Commonly known as:  MEVACOR TAKE 1 TABLET AT BEDTIME    methocarbamol 500 MG tablet Commonly known as:  ROBAXIN Take 1 tablet (500 mg total) by mouth 3 (three) times daily as needed for muscle spasms.   multivitamin tablet Take 1 tablet by mouth daily.   omeprazole 20 MG capsule Commonly known as:  PRILOSEC TAKE 1 CAPSULE EVERY DAY   TYLENOL 325 MG tablet Generic drug:  acetaminophen Take 650 mg by mouth every 6 (six) hours as needed for mild pain.   vitamin B-12 1000 MCG tablet Commonly known as:  CYANOCOBALAMIN Take 1,000 mcg by mouth daily.      Follow-up Information    [name], MD. Schedule an appointment as soon as possible for a visit in 1 week(s).   Specialty:  Family Medicine Why:  Hospital follow up  Contact information: [place] [address] [phone]          Allergies  Allergen Reactions  .  Amitriptyline Other (See Comments)    Worsened migraine and confusion  . Oxybutynin Other (See Comments)    Urinary retention/hesitancy  . Tramadol Other (See Comments)    Causes confusion and dizziness  . Niacin And Related Itching  . Septra [Sulfamethoxazole-Trimethoprim] Itching    Consultations:  Procedures/Studies: Ct Head Wo Contrast  Result Date: 05/19/2017 CLINICAL DATA:  Altered mental status and slurred speech EXAM: CT HEAD WITHOUT CONTRAST TECHNIQUE: Contiguous axial images were obtained from the base of the skull through the vertex without intravenous contrast. COMPARISON:  None. FINDINGS: Brain: No mass lesion, intraparenchymal hemorrhage or extra-axial collection. No evidence of acute cortical infarct. Hyperdense focus in the right cerebellum is favored to be calcification. There is bilateral basal ganglia mineralization. There is periventricular hypoattenuation compatible with chronic microvascular disease. Vascular: No hyperdense vessel or unexpected calcification. Skull: Normal visualized skull base, calvarium and extracranial soft tissues. Sinuses/Orbits: No sinus  fluid levels or advanced mucosal thickening. No mastoid effusion. Normal orbits. IMPRESSION: No acute intracranial abnormality. Findings of mild chronic small vessel disease. Electronically Signed   By: Ulyses Jarred M.D.   On: 05/19/2017 06:32   Mr Brain Wo Contrast  Result Date: 05/20/2017 CLINICAL DATA:  72 y/o F; episode of confusion and disorientation with aphasia. History of migraines. EXAM: MRI HEAD WITHOUT CONTRAST TECHNIQUE: Multiplanar, multiecho pulse sequences of the brain and surrounding structures were obtained without intravenous contrast. COMPARISON:  05/19/2017 CT head FINDINGS: Brain: No acute infarction, hemorrhage, hydrocephalus, extra-axial collection or mass lesion. Severalnonspecific foci of T2 FLAIR hyperintense signal abnormality in subcortical and periventricular white matter are compatible withmoderatechronic microvascular ischemic changes for age. Mildbrain parenchymal volume loss. Few punctate foci of susceptibility hypointensity are present within left periventricular white matter compatible with hemosiderin deposition of chronic microhemorrhage. Vascular: Normal flow voids. Skull and upper cervical spine: Normal marrow signal. Sinuses/Orbits: Negative. Other: None. IMPRESSION: 1. No acute intracranial abnormality identified. 2. Moderate chronic microvascular ischemic changes and mild parenchymal volume loss of the brain. Electronically Signed   By: Kristine Garbe M.D.   On: 05/20/2017 21:56   Dg Chest Portable 1 View  Result Date: 05/19/2017 CLINICAL DATA:  Mental status changes. EXAM: PORTABLE CHEST 1 VIEW COMPARISON:  None. FINDINGS: The heart is mildly enlarged with a left ventricular configuration. Mild tortuosity and calcification of the thoracic aorta. Low lung volumes with vascular crowding and bibasilar atelectasis. No definite infiltrates or effusions. The bony thorax is intact. IMPRESSION: Cardiac enlargement. Low lung volumes with vascular crowding and  bibasilar atelectasis. No definite infiltrates, edema or effusions. Electronically Signed   By: Marijo Sanes M.D.   On: 05/19/2017 12:34   Dg Hip Unilat With Pelvis 2-3 Views Left  Result Date: 05/08/2017 CLINICAL DATA:  Left hip pain with sciatica. EXAM: DG HIP (WITH OR WITHOUT PELVIS) 2-3V LEFT COMPARISON:  None. FINDINGS: Mild left femoral head and neck junction spur formation. Normal appearing right hip. Mild lower lumbar spine degenerative changes. IMPRESSION: 1. Mild left hip degenerative changes. 2. Mild lower lumbar spine degenerative changes. Electronically Signed   By: Claudie Revering M.D.   On: 05/08/2017 10:53    Discharge Exam: Vitals:   05/20/17 2153 05/21/17 0528  BP: (!) 143/61 (!) 147/67  Pulse: 80 64  Resp: 18 18  Temp: 97.6 F (36.4 C) 97.7 F (36.5 C)  SpO2: 95% 99%   Vitals:   05/20/17 0503 05/20/17 1454 05/20/17 2153 05/21/17 0528  BP: (!) 140/55 (!) 147/55 (!) 143/61 (!) 147/67  Pulse: 71  71 80 64  Resp: 18 18 18 18   Temp: 98 F (36.7 C) 97.7 F (36.5 C) 97.6 F (36.4 C) 97.7 F (36.5 C)  TempSrc: Oral Oral Oral Oral  SpO2: 98% 97% 95% 99%  Weight:    83.2 kg (183 lb 6.8 oz)    General: Pt is alert, awake, not in acute distress Cardiovascular: RRR, S1/S2 +, no rubs, no gallops Respiratory: CTA bilaterally, no wheezing, no rhonchi Abdominal: Soft, NT, ND, bowel sounds + Extremities: no edema, no cyanosis   The results of significant diagnostics from this hospitalization (including imaging, microbiology, ancillary and laboratory) are listed below for reference.     Microbiology: Recent Results (from the past 240 hour(s))  Blood culture (routine x 2)     Status: None (Preliminary result)   Collection Time: 05/19/17 11:56 AM  Result Value Ref Range Status   Specimen Description BLOOD BLOOD RIGHT HAND  Final   Special Requests IN PEDIATRIC BOTTLE Blood Culture adequate volume  Final   Culture NO GROWTH < 24 HOURS  Final   Report Status PENDING   Incomplete  Blood culture (routine x 2)     Status: None (Preliminary result)   Collection Time: 05/19/17 12:04 PM  Result Value Ref Range Status   Specimen Description BLOOD BLOOD LEFT HAND  Final   Special Requests   Final    BOTTLES DRAWN AEROBIC AND ANAEROBIC Blood Culture adequate volume   Culture NO GROWTH < 24 HOURS  Final   Report Status PENDING  Incomplete  CSF culture     Status: None (Preliminary result)   Collection Time: 05/19/17  2:23 PM  Result Value Ref Range Status   Specimen Description FLUID CSF  Final   Special Requests NONE  Final   Gram Stain   Final    WBC PRESENT, PREDOMINANTLY PMN NO ORGANISMS SEEN CYTOSPIN SMEAR    Culture NO GROWTH 2 DAYS  Final   Report Status PENDING  Incomplete     Labs: BNP (last 3 results) No results for input(s): BNP in the last 8760 hours. Basic Metabolic Panel: Recent Labs  Lab 05/19/17 0249 05/20/17 0416  NA 138 141  K 3.9 4.0  CL 102 110  CO2 26 24  GLUCOSE 137* 107*  BUN 11 12  CREATININE 0.65 0.69  CALCIUM 9.5 8.8*   Liver Function Tests: Recent Labs  Lab 05/19/17 0249 05/20/17 0416  AST 31 22  ALT 42 31  ALKPHOS 90 72  BILITOT 0.8 1.1  PROT 7.4 6.4*  ALBUMIN 4.2 3.5   No results for input(s): LIPASE, AMYLASE in the last 168 hours. Recent Labs  Lab 05/19/17 0908  AMMONIA 58*   CBC: Recent Labs  Lab 05/19/17 0249 05/20/17 0416  WBC 8.1 7.1  NEUTROABS 5.1  --   HGB 12.5 12.0  HCT 38.2 37.0  MCV 91.8 93.0  PLT 353 293   Cardiac Enzymes: No results for input(s): CKTOTAL, CKMB, CKMBINDEX, TROPONINI in the last 168 hours. BNP: Invalid input(s): POCBNP CBG: Recent Labs  Lab 05/19/17 0249 05/19/17 2325  GLUCAP 119* 160*   D-Dimer No results for input(s): DDIMER in the last 72 hours. Hgb A1c No results for input(s): HGBA1C in the last 72 hours. Lipid Profile No results for input(s): CHOL, HDL, LDLCALC, TRIG, CHOLHDL, LDLDIRECT in the last 72 hours. Thyroid function studies No results  for input(s): TSH, T4TOTAL, T3FREE, THYROIDAB in the last 72 hours.  Invalid input(s): FREET3 Anemia work up No results for  input(s): VITAMINB12, FOLATE, FERRITIN, TIBC, IRON, RETICCTPCT in the last 72 hours. Urinalysis    Component Value Date/Time   COLORURINE YELLOW 05/19/2017 0718   APPEARANCEUR CLEAR 05/19/2017 0718   LABSPEC 1.015 05/19/2017 0718   PHURINE 8.0 05/19/2017 0718   GLUCOSEU NEGATIVE 05/19/2017 0718   HGBUR NEGATIVE 05/19/2017 0718   BILIRUBINUR NEGATIVE 05/19/2017 0718   BILIRUBINUR Negative 12/01/2015 1552   KETONESUR 20 (A) 05/19/2017 0718   PROTEINUR 100 (A) 05/19/2017 0718   UROBILINOGEN 0.2 12/01/2015 1552   UROBILINOGEN 0.2 11/13/2014 1005   NITRITE NEGATIVE 05/19/2017 0718   LEUKOCYTESUR NEGATIVE 05/19/2017 0718   Sepsis Labs Invalid input(s): PROCALCITONIN,  WBC,  LACTICIDVEN Microbiology Recent Results (from the past 240 hour(s))  Blood culture (routine x 2)     Status: None (Preliminary result)   Collection Time: 05/19/17 11:56 AM  Result Value Ref Range Status   Specimen Description BLOOD BLOOD RIGHT HAND  Final   Special Requests IN PEDIATRIC BOTTLE Blood Culture adequate volume  Final   Culture NO GROWTH < 24 HOURS  Final   Report Status PENDING  Incomplete  Blood culture (routine x 2)     Status: None (Preliminary result)   Collection Time: 05/19/17 12:04 PM  Result Value Ref Range Status   Specimen Description BLOOD BLOOD LEFT HAND  Final   Special Requests   Final    BOTTLES DRAWN AEROBIC AND ANAEROBIC Blood Culture adequate volume   Culture NO GROWTH < 24 HOURS  Final   Report Status PENDING  Incomplete  CSF culture     Status: None (Preliminary result)   Collection Time: 05/19/17  2:23 PM  Result Value Ref Range Status   Specimen Description FLUID CSF  Final   Special Requests NONE  Final   Gram Stain   Final    WBC PRESENT, PREDOMINANTLY PMN NO ORGANISMS SEEN CYTOSPIN SMEAR    Culture NO GROWTH 2 DAYS  Final   Report Status  PENDING  Incomplete     Time coordinating discharge: 35 minutes  SIGNED:  Chipper Oman, MD  [facility] 05/21/2017, 12:23 PM  Pager please text page via  [email or web address]

## 2017-05-21 NOTE — Care Management CC44 (Signed)
Condition Code 44 Documentation Completed  Patient Details  Name: SHATIA SINDONI MRN: [id number] Date of Birth: 08-26-1945   Condition Code 44 given:  Yes Patient signature on Condition Code 44 notice:  Yes Documentation of 2 MD's agreement:  Yes Code 44 added to claim:  Yes    Arley Phenix, RN 05/21/2017, 12:18 PM

## 2017-05-21 NOTE — Progress Notes (Signed)
Pt ready for discharge to home accomp by husband.  No rx. All DC instructions given and reviewed.  OK per Dr. Quincy Simmonds for pt to take her ASA now

## 2017-05-22 ENCOUNTER — Encounter: Payer: Self-pay | Admitting: Family Medicine

## 2017-05-22 ENCOUNTER — Telehealth: Payer: Self-pay

## 2017-05-22 DIAGNOSIS — M545 Low back pain: Secondary | ICD-10-CM | POA: Diagnosis not present

## 2017-05-22 DIAGNOSIS — M5117 Intervertebral disc disorders with radiculopathy, lumbosacral region: Secondary | ICD-10-CM | POA: Diagnosis not present

## 2017-05-22 DIAGNOSIS — M79605 Pain in left leg: Secondary | ICD-10-CM | POA: Diagnosis not present

## 2017-05-22 DIAGNOSIS — M4726 Other spondylosis with radiculopathy, lumbar region: Secondary | ICD-10-CM | POA: Diagnosis not present

## 2017-05-22 DIAGNOSIS — M5116 Intervertebral disc disorders with radiculopathy, lumbar region: Secondary | ICD-10-CM | POA: Diagnosis not present

## 2017-05-22 DIAGNOSIS — M4856XA Collapsed vertebra, not elsewhere classified, lumbar region, initial encounter for fracture: Secondary | ICD-10-CM | POA: Diagnosis not present

## 2017-05-22 LAB — CSF CULTURE: Culture: NO GROWTH

## 2017-05-22 LAB — CSF CULTURE W GRAM STAIN

## 2017-05-22 NOTE — Telephone Encounter (Signed)
Copied from [place] [id number]. Topic: Inquiry >> May 22, 2017  3:52 PM Conception Chancy, NT wrote: Reason for CRM: Helene Kelp is calling from [facility] and patient was seen today for a MRI and they can not find the order and are needing that.   Fax : [phone] Phone : [phone]

## 2017-05-22 NOTE — Telephone Encounter (Signed)
Order faxed to [facility] at [phone].

## 2017-05-23 ENCOUNTER — Ambulatory Visit: Payer: Medicare HMO | Admitting: Family Medicine

## 2017-05-23 ENCOUNTER — Telehealth: Payer: Self-pay

## 2017-05-23 DIAGNOSIS — Z0289 Encounter for other administrative examinations: Secondary | ICD-10-CM

## 2017-05-23 NOTE — Telephone Encounter (Signed)
Copied from [place] #[id number]. Topic: Quick Communication - Appointment Cancellation >> May 23, 2017  8:17 AM Yvette Rack wrote: Patient called to cancel appointment scheduled for 05-23-17. Patient has not rescheduled their appointment.    Route to department's PEC pool.

## 2017-05-23 NOTE — Telephone Encounter (Signed)
Spoke with pt to get update on her condition.  Says she is feeling a lot better. Denies any confusion, HA or fever.  However her BP was 155/80, which she was a little concerned about.  But was told by the nurse at the hospital, that number was not to alarming.  Pt still wants to know what Dr. Darnell Level thinks.

## 2017-05-23 NOTE — Telephone Encounter (Signed)
For BP - rec monitor at home and update with readings. No HA, vision changes, CP/tightness, SOB, leg swelling.  BP Readings from Last 3 Encounters:  05/21/17 (!) 147/67  05/08/17 126/80  05/03/17 130/78    Discussed lumbar MRI - L L3/4 HNP pushing on L L4 nerve root. PT course has helped-  Taking muscle relaxant also helps.   Planning to return mid March. Will let me know how she does in interim. Discussed red flags to seek urgent care.

## 2017-05-23 NOTE — Telephone Encounter (Signed)
The cancellation note for 05/23/17 appt has pt being out of town. Do you want to charge late cancellation fee and does pt need to reschedule? appt was for hospital f/u.

## 2017-05-23 NOTE — Telephone Encounter (Signed)
Dana [name] - ok to not charge late cancellation fee.  Dana [name] - plz call pt for update - any further confusion, headache, fevers? She was seen at ER for acute encephalopathy of unknown cause. They cancelled hosp f/u visit today and are going out of town for extended period of time.

## 2017-05-24 LAB — CULTURE, BLOOD (ROUTINE X 2)
Culture: NO GROWTH
Culture: NO GROWTH
Special Requests: ADEQUATE
Special Requests: ADEQUATE

## 2017-06-05 NOTE — Telephone Encounter (Signed)
Received MRI report with image on CD - would patient like this mailed to her or does she want to come pick it up? Placed in Lisa'x box.

## 2017-06-05 NOTE — Telephone Encounter (Signed)
Spoke with pt relaying message per Dr. Darnell Level.  Pt says she is still in [facility] and will be until mid March.  Asks that we leave at front desk and she will pick it up. [Placed report and CD at front office.]

## 2017-06-16 ENCOUNTER — Telehealth: Payer: Self-pay | Admitting: Family Medicine

## 2017-06-16 MED ORDER — METHOCARBAMOL 500 MG PO TABS: 500.0000 mg | ORAL_TABLET | Freq: Three times a day (TID) | ORAL | 0 refills | Status: DC | PRN

## 2017-06-16 NOTE — Telephone Encounter (Signed)
Copied from [place]. Topic: Quick Communication - Rx Refill/Question >> Jun 16, 2017  2:08 PM Bea Graff, NT wrote: Medication: methocarbamol Sherlene Shams)    Has the patient contacted their pharmacy? Yes.     (Agent: If no, request that the patient contact the pharmacy for the refill.)   Preferred Pharmacy (with phone number or street name): [facility] [address].    Agent: Please be advised that RX refills may take up to 3 business days. We ask that you follow-up with your pharmacy.

## 2017-06-16 NOTE — Telephone Encounter (Signed)
Requesting refill of Robaxin  LOV 05/08/17   LRF 04/10/17  #60  0  Refills  Preferred Pharmacy (with phone number [facility] street [facility] [address].

## 2017-06-16 NOTE — Telephone Encounter (Signed)
Sent in

## 2017-07-10 ENCOUNTER — Telehealth: Payer: Self-pay | Admitting: Family Medicine

## 2017-07-10 NOTE — Telephone Encounter (Signed)
Copied from [place] [id number]. Topic: Quick Communication - See Telephone Encounter >> Jul 10, 2017  3:20 PM Lolita Rieger, [place] wrote: CRM for notification. See Telephone encounter for:   07/10/17.pt stated that who she spoke to told her that she would not be charged for a cancelling an app on 05/23/17 and pt stated that she received a bill Please call pt [phone] 

## 2017-07-11 ENCOUNTER — Encounter: Payer: Self-pay | Admitting: Family Medicine

## 2017-07-11 ENCOUNTER — Ambulatory Visit (INDEPENDENT_AMBULATORY_CARE_PROVIDER_SITE_OTHER): Payer: Medicare HMO | Admitting: Family Medicine

## 2017-07-11 VITALS — BP 124/72 | HR 66 | Temp 97.3°F | Wt 180.0 lb

## 2017-07-11 DIAGNOSIS — M5126 Other intervertebral disc displacement, lumbar region: Secondary | ICD-10-CM

## 2017-07-11 DIAGNOSIS — M79605 Pain in left leg: Secondary | ICD-10-CM

## 2017-07-11 DIAGNOSIS — M545 Low back pain: Secondary | ICD-10-CM

## 2017-07-11 MED ORDER — FUROSEMIDE 20 MG PO TABS: 20.0000 mg | ORAL_TABLET | Freq: Every day | ORAL | 1 refills | Status: DC | PRN

## 2017-07-11 NOTE — Patient Instructions (Addendum)
We will refer you to neurosurgeon for evaluation of herniated disc.  See Rosaria Ferries or Anastasiya to schedule appointment.  Good to see you today.

## 2017-07-11 NOTE — Progress Notes (Signed)
BP 124/72 (BP Location: Left Arm, Patient Position: Sitting, Cuff Size: Normal)   Pulse 66   Temp (!) 97.3 F (36.3 C) (Oral)   Wt 180 lb (81.6 kg)   SpO2 98%   BMI 31.89 kg/m    CC: discuss MRI results Subjective:    Patient ID: Dana [name], female    DOB: 11/07/1945, 72 y.o.   MRN: [id number]  HPI: Dana [name] is a 72 y.o. female presenting on 07/11/2017 for MRI results (Pt wants to discuss further treatment based on the MRI results that were relayed to her. Pt also wants to discuss her fluctuating BP.)   Recent trip to [facility] for several months - no pain while traveling. Feels back pain went into "remission" on trip to [facility]. 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 Describes L lateral lowre back pain with radiation down posterior buttock and lateral thigh to anterior knee. Treating with tylenol/ibuprofen and robaxin. Denies leg weakness, bowel/bladder incontinence.   Lumbar MRI 05/2017 - L L3/4 HNP pushing on thecal sac and L L4 nerve root, as well as L4/5 central disc protrusion. PT course was helpful, as was robaxin.  bp fluctuating per patient - but well controlled on average. Not regularly needing lasix.   Relevant past medical, surgical, family and social history reviewed and updated as indicated. Interim medical history since our last visit reviewed. Allergies and medications reviewed and updated. Outpatient Medications Prior to Visit  Medication Sig Dispense Refill  . acetaminophen (TYLENOL) 325 MG tablet Take 650 mg by mouth every 6 (six) hours as needed for mild pain.     Marland Kitchen aspirin 81 MG tablet Take 81 mg by mouth daily.    Marland Kitchen aspirin-acetaminophen-caffeine (EXCEDRIN MIGRAINE) 250-250-65 MG tablet Take 1 tablet by mouth every 6 (six) hours as needed for headache or migraine. 30 tablet 0  . fluticasone (FLONASE) 50 MCG/ACT nasal spray USE 2 SPRAYS IN EACH NOSTRIL EVERY DAY 48 g 3  . lovastatin  (MEVACOR) 40 MG tablet TAKE 1 TABLET AT BEDTIME 90 tablet 3  . methocarbamol (ROBAXIN) 500 MG tablet Take 1 tablet (500 mg total) by mouth 3 (three) times daily as needed for muscle spasms. 60 tablet 0  . Multiple Vitamin (MULTIVITAMIN) tablet Take 1 tablet by mouth daily.    Marland Kitchen omeprazole (PRILOSEC) 20 MG capsule TAKE 1 CAPSULE EVERY DAY 90 capsule 3  . vitamin B-12 (CYANOCOBALAMIN) 1000 MCG tablet Take 1,000 mcg by mouth daily.    . furosemide (LASIX) 20 MG tablet Take 1 tablet (20 mg total) by mouth daily as needed for fluid.     No facility-administered medications prior to visit.      Per HPI unless specifically indicated in ROS section below Review of Systems     Objective:    BP 124/72 (BP Location: Left Arm, Patient Position: Sitting, Cuff Size: Normal)   Pulse 66   Temp (!) 97.3 F (36.3 C) (Oral)   Wt 180 lb (81.6 kg)   SpO2 98%   BMI 31.89 kg/m   Wt Readings from Last 3 Encounters:  07/11/17 180 lb (81.6 kg)  05/21/17 183 lb 6.8 oz (83.2 kg)  05/08/17 179 lb (81.2 kg)    Physical Exam  Constitutional: She appears well-developed and well-nourished. No distress.  Musculoskeletal: She exhibits no edema.  No pain midline spine No paraspinous mm tenderness Neg SLR bilaterally.  Neurological: She is  alert. She has normal strength. No sensory deficit.  Reflex Scores:      Patellar reflexes are 2+ on the right side and 2+ on the left side. 5/5 strength BLE  Skin: Skin is warm and dry.  Psychiatric: She has a normal mood and affect.  Nursing note and vitals reviewed.     Assessment & Plan:   Problem List Items Addressed This Visit    Lumbar herniated disc - Primary    PT helped. Continues robaxin PRN. Recurrent pain with increased activity. Desires further eval/treatment. Will refer to spine center. Pt agrees with plan.       Relevant Orders   Ambulatory referral to Neurosurgery       Meds ordered this encounter  Medications  . furosemide (LASIX) 20 MG  tablet    Sig: Take 1 tablet (20 mg total) by mouth daily as needed for fluid.    Dispense:  30 tablet    Refill:  1   Orders Placed This Encounter  Procedures  . Ambulatory referral to Neurosurgery    Referral Priority:   Routine    Referral Type:   Surgical    Referral Reason:   Specialty Services Required    Requested Specialty:   Neurosurgery    Number of Visits Requested:   1    Follow up plan: Return if symptoms worsen or fail to improve.  [name], MD

## 2017-07-11 NOTE — Assessment & Plan Note (Signed)
PT helped. Continues robaxin PRN. Recurrent pain with increased activity. Desires further eval/treatment. Will refer to spine center. Pt agrees with plan.

## 2017-07-11 NOTE — Telephone Encounter (Signed)
I have sent to charge correction. Appt was filled.

## 2017-07-20 DIAGNOSIS — M5416 Radiculopathy, lumbar region: Secondary | ICD-10-CM | POA: Diagnosis not present

## 2017-07-20 DIAGNOSIS — R030 Elevated blood-pressure reading, without diagnosis of hypertension: Secondary | ICD-10-CM | POA: Diagnosis not present

## 2017-07-20 DIAGNOSIS — Z6831 Body mass index (BMI) 31.0-31.9, adult: Secondary | ICD-10-CM | POA: Diagnosis not present

## 2017-07-25 ENCOUNTER — Encounter: Payer: Self-pay | Admitting: *Deleted

## 2017-07-25 ENCOUNTER — Encounter: Payer: Medicare HMO | Attending: Family Medicine | Admitting: *Deleted

## 2017-07-25 VITALS — BP 136/80 | Ht 63.0 in | Wt 181.1 lb

## 2017-07-25 DIAGNOSIS — Z713 Dietary counseling and surveillance: Secondary | ICD-10-CM | POA: Diagnosis not present

## 2017-07-25 DIAGNOSIS — E785 Hyperlipidemia, unspecified: Secondary | ICD-10-CM | POA: Insufficient documentation

## 2017-07-25 DIAGNOSIS — E669 Obesity, unspecified: Secondary | ICD-10-CM | POA: Diagnosis not present

## 2017-07-25 DIAGNOSIS — Z6832 Body mass index (BMI) 32.0-32.9, adult: Secondary | ICD-10-CM | POA: Diagnosis not present

## 2017-07-25 DIAGNOSIS — E119 Type 2 diabetes mellitus without complications: Secondary | ICD-10-CM | POA: Insufficient documentation

## 2017-07-25 NOTE — Patient Instructions (Signed)
Check blood sugars 2 x day before breakfast and 2 hrs after supper 3 x week Bring blood sugar records to the next appointment/class  Exercise:   Walk as tolerated  Eat 3 meals day,   1-2  snacks a day Space meals 4-6 hours apart Limit fried foods and desserts/sweets Avoid sugar sweetened drinks (soda, juices)  Call back to schedule diabetes classes or an appointment with the dietitian

## 2017-07-25 NOTE — Progress Notes (Signed)
Diabetes Self-Management Education  Visit Type: First/Initial  Appt. Start Time: 1510 Appt. End Time: 1620  07/25/2017  [name], identified by name and date of birth, is a 72 y.o. female with a diagnosis of Diabetes: Type 2.   ASSESSMENT  Blood pressure 136/80, height 5\' 3"  (1.6 m), weight 181 lb 1.6 oz (82.1 kg). Body mass index is 32.08 kg/m.  Diabetes Self-Management Education - 07/25/17 1633      Visit Information   Visit Type  First/Initial      Initial Visit   Diabetes Type  Type 2    Are you currently following a meal plan?  No Pt is using diet to decrease high oxalates due to past kidney stone.     Are you taking your medications as prescribed?  Yes    Date Diagnosed  1 year      Health Coping   How would you rate your overall health?  Good      Psychosocial Assessment   Patient Belief/Attitude about Diabetes  Other (comment) "okay"    Self-care barriers  None    Self-management support  Doctor's office;Family    Patient Concerns  Nutrition/Meal planning;Weight Control;Glycemic Control;Healthy Lifestyle    Preferred Learning Style  Hands on    Learning Readiness  Ready    How often do you need to have someone help you when you read instructions, pamphlets, or other written materials from your doctor or pharmacy?  1 - Never    What is the last grade level you completed in school?  12th      Pre-Education Assessment   Patient understands the diabetes disease and treatment process.  Needs Instruction    Patient understands incorporating nutritional management into lifestyle.  Needs Instruction    Patient undertands incorporating physical activity into lifestyle.  Needs Instruction    Patient understands using medications safely.  Needs Instruction    Patient understands monitoring blood glucose, interpreting and using results  Needs Review    Patient understands prevention, detection, and treatment of acute complications.  Needs Instruction    Patient  understands prevention, detection, and treatment of chronic complications.  Needs Instruction    Patient understands how to develop strategies to address psychosocial issues.  Needs Instruction    Patient understands how to develop strategies to promote health/change behavior.  Needs Instruction      Complications   Last HgB A1C per patient/outside source  6.7 % 05/03/17    How often do you check your blood sugar?  3-4 times / week    Fasting Blood glucose range (mg/dL)  70-129 Pt reports FBG's 100-130 mg/dL.     Have you had a dilated eye exam in the past 12 months?  Yes    Have you had a dental exam in the past 12 months?  No    Are you checking your feet?  No      Dietary Intake   Breakfast  toast and butter; oatmeal, egg white or egg beaters, bacon or sausage    Lunch  pimento cheese, deli ham or [place] sandwich, occasional chips; frozen dinner    Dinner  hamburger, pork chop, chicken, pizza, pasta, occasional biscuit, potoates, corn, beans, rice, salads, broccoli, cauliflower, carrots, cuccumbers    Snack (evening)  cookie, milk, peanut butter    Beverage(s)  water, juice, soda      Exercise   Exercise Type  ADL's      Patient Education   Previous Diabetes Education  No    Disease state   Factors that contribute to the development of diabetes    Nutrition management   Role of diet in the treatment of diabetes and the relationship between the three main macronutrients and blood glucose level;Carbohydrate counting;Reviewed blood glucose goals for pre and post meals and how to evaluate the patients' food intake on their blood glucose level.    Physical activity and exercise   Role of exercise on diabetes management, blood pressure control and cardiac health.    Monitoring  Purpose and frequency of SMBG.;Taught/discussed recording of test results and interpretation of SMBG.;Identified appropriate SMBG and/or A1C goals.    Chronic complications  Relationship between chronic complications  and blood glucose control    Psychosocial adjustment  Identified and addressed patients feelings and concerns about diabetes      Individualized Goals (developed by patient)   Reducing Risk Improve blood sugars Lose weight Lead a healthier lifestyle Become more fit     Outcomes   Expected Outcomes  Demonstrated interest in learning. Expect positive outcomes    Future DMSE  Other (comment) Pt to call back after she checks with insurance coverage.        Individualized Plan for Diabetes Self-Management Training:   Learning Objective:  Patient will have a greater understanding of diabetes self-management. Patient education plan is to attend individual and/or group sessions per assessed needs and concerns.   Plan:   Patient Instructions  Check blood sugars 2 x day before breakfast and 2 hrs after supper 3 x week Bring blood sugar records to the next appointment/class Exercise:   Walk as tolerated Eat 3 meals day,   1-2  snacks a day Space meals 4-6 hours apart Limit fried foods and desserts/sweets Avoid sugar sweetened drinks (soda, juices) Call back to schedule diabetes classes or an appointment with the dietitian   Expected Outcomes:  Demonstrated interest in learning. Expect positive outcomes  Education material provided:  General Meal Planning Guidelines Simple Meal Plan  If problems or questions, patient to contact team via:  Johny Drilling, RN, CCM, CDE [phone]  Future DSME appointment:  (Pt to call back after she checks with insurance coverage.) She was offered diabetes classes or 1:1 appointment with the dietitian.

## 2017-08-07 DIAGNOSIS — M5416 Radiculopathy, lumbar region: Secondary | ICD-10-CM | POA: Diagnosis not present

## 2017-08-30 ENCOUNTER — Encounter: Payer: Self-pay | Admitting: *Deleted

## 2017-09-04 ENCOUNTER — Encounter: Payer: Self-pay | Admitting: Physician Assistant

## 2017-09-04 ENCOUNTER — Ambulatory Visit (INDEPENDENT_AMBULATORY_CARE_PROVIDER_SITE_OTHER): Payer: Medicare HMO | Admitting: Physician Assistant

## 2017-09-04 ENCOUNTER — Ambulatory Visit: Payer: Self-pay | Admitting: *Deleted

## 2017-09-04 ENCOUNTER — Other Ambulatory Visit: Payer: Self-pay

## 2017-09-04 VITALS — BP 126/60 | HR 73 | Temp 97.7°F | Resp 14 | Ht 63.0 in | Wt 177.0 lb

## 2017-09-04 DIAGNOSIS — R300 Dysuria: Secondary | ICD-10-CM | POA: Diagnosis not present

## 2017-09-04 LAB — POCT URINALYSIS DIPSTICK
Bilirubin, UA: NEGATIVE
GLUCOSE UA: NEGATIVE
Ketones, UA: NEGATIVE
NITRITE UA: NEGATIVE
SPEC GRAV UA: 1.015 (ref 1.010–1.025)
Urobilinogen, UA: 0.2 E.U./dL
pH, UA: 5 (ref 5.0–8.0)

## 2017-09-04 MED ORDER — CEPHALEXIN 500 MG PO CAPS: 500.0000 mg | ORAL_CAPSULE | Freq: Two times a day (BID) | ORAL | 0 refills | Status: AC

## 2017-09-04 NOTE — Telephone Encounter (Signed)
abd pain hurts when urinating   Call to patient- she started having signs of UTI on Saturday- she is having urgency and frequency. She has started having some discomfort with urination.  Reason for Disposition . Urinating more frequently than usual (i.e., frequency)  Answer Assessment - Initial Assessment Questions 1. SYMPTOM: "What's the main symptom you're concerned about?" (e.g., frequency, incontinence)     Pain  and urgency 2. ONSET: "When did the  ________  start?"     Started Saturday 3. PAIN: "Is there any pain?" If so, ask: "How bad is it?" (Scale: 1-10; mild, moderate, severe)     Pain with urination- a little- 3 4. CAUSE: "What do you think is causing the symptoms?"     UTI 5. OTHER SYMPTOMS: "Do you have any other symptoms?" (e.g., fever, flank pain, blood in urine, pain with urination)     Urgency, frequency, pain- abdominal 6. PREGNANCY: "Is there any chance you are pregnant?" "When was your last menstrual period?"     n/a  Protocols used: URINARY [facility]

## 2017-09-04 NOTE — Progress Notes (Signed)
Patient presents to clinic today c/o 3 days of urinary urgency, urinary frequency, and dysuria. Denies any fever, chills, n/v or flank pain. Notes suprapubic pain. [place] and says she did not stay very well-hydrated. Denies history of UTI. Does have history of kidney stone s/p lithotripsy in 2017.    Past Medical History:  Diagnosis Date  . Allergy   . Anemia   . Arthritis   . Cataract   . Controlled type 2 diabetes mellitus without complication, without long-term current use of insulin ([place])   . Diverticulitis   . History of chicken pox   . Hyperlipidemia   . Kidney stone   . 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 Migraine   . Urge incontinence 04/29/2014    Current Outpatient Medications on File Prior to Visit  Medication Sig Dispense Refill  . acetaminophen (TYLENOL) 325 MG tablet Take 650 mg by mouth every 6 (six) hours as needed for mild pain.     Marland Kitchen aspirin 81 MG tablet Take 81 mg by mouth daily.    Marland Kitchen aspirin-acetaminophen-caffeine (EXCEDRIN MIGRAINE) 250-250-65 MG tablet Take 1 tablet by mouth every 6 (six) hours as needed for headache or migraine. 30 tablet 0  . co-enzyme Q-10 30 MG capsule Take 30 mg by mouth 3 (three) times daily.    . fluticasone (FLONASE) 50 MCG/ACT nasal spray USE 2 SPRAYS IN EACH NOSTRIL EVERY DAY 48 g 3  . furosemide (LASIX) 20 MG tablet Take 1 tablet (20 mg total) by mouth daily as needed for fluid. 30 tablet 1  . lovastatin (MEVACOR) 40 MG tablet TAKE 1 TABLET AT BEDTIME 90 tablet 3  . methocarbamol (ROBAXIN) 500 MG tablet Take 1 tablet (500 mg total) by mouth 3 (three) times daily as needed for muscle spasms. 60 tablet 0  . Multiple Vitamin (MULTIVITAMIN) tablet Take 1 tablet by mouth daily.    Marland Kitchen omeprazole (PRILOSEC) 20 MG capsule TAKE 1 CAPSULE EVERY DAY 90 capsule 3  . vitamin B-12 (CYANOCOBALAMIN) 1000 MCG tablet Take 1,000 mcg by mouth daily.     No current facility-administered medications on file prior to visit.      Allergies  Allergen Reactions  . Amitriptyline Other (See Comments)    Worsened migraine and confusion  . Oxybutynin Other (See Comments)    Urinary retention/hesitancy  . Tramadol Other (See Comments)    Causes confusion and dizziness  . Niacin And Related Itching  . Septra [Sulfamethoxazole-Trimethoprim] Itching    Family History  Problem Relation Age of Onset  . Heart disease Mother 57  . Hyperlipidemia Mother   . Hyperlipidemia Father   . Stroke Father   . Heart disease Father   . Diabetes Father   . Fibromyalgia Sister   . Hyperlipidemia Sister   . Heart disease Brother   . Diabetes Brother     Social History   Socioeconomic History  . Marital status: Married    Spouse name: [name]  . Number of children: 1  . Years of education: 71  . Highest education level: Not on file  Occupational History    Comment: Retired  Scientific laboratory technician  . Financial resource strain: Not on file  . Food insecurity:    Worry: Not on file    Inability: Not on file  . Transportation needs:    Medical: Not on file    Non-medical: Not on file  Tobacco Use  . Smoking status: Never Smoker  . Smokeless  tobacco: Never Used  Substance and Sexual Activity  . Alcohol use: No    Alcohol/week: 0.0 oz  . Drug use: No  . Sexual activity: Not on file  Lifestyle  . Physical activity:    Days per week: Not on file    Minutes per session: Not on file  . Stress: Not on file  Relationships  . Social connections:    Talks on phone: Not on file    Gets together: Not on file    Attends religious service: Not on file    Active member of club or organization: Not on file    Attends meetings of clubs or organizations: Not on file    Relationship status: Not on file  Other Topics Concern  . Not on file  Social History Narrative   Patient is married and lives at home with her husband [name]). Patient is retired.    Edu: highschool education.   Right handed.   Exercise 3 times a week.   Diet:  good water, fruits/vegetables daily   Caffeine: None      HAs living will, HCPOA, full code   Review of Systems - See HPI.  All other ROS are negative.  BP 126/60   Pulse 73   Temp 97.7 F (36.5 C) (Oral)   Resp 14   Ht 5\' 3"  (1.6 m)   Wt 177 lb (80.3 kg)   SpO2 98%   BMI 31.35 kg/m   Physical Exam  Constitutional: She appears well-developed and well-nourished.  HENT:  Head: Normocephalic and atraumatic.  Cardiovascular: Normal rate, regular rhythm and normal heart sounds.  Pulmonary/Chest: Effort normal and breath sounds normal. No stridor. No respiratory distress. She has no wheezes. She has no rales. She exhibits no tenderness.  Abdominal: Soft. There is tenderness in the suprapubic area. There is no CVA tenderness.  Vitals reviewed.  Assessment/Plan: 1. Dysuria Urine dip + blood and LE. Will send for culture. Symptoms are classic for UTI. Will start Keflex empirically. Will alter based on culture. Increase fluids. Start probiotic.   - POCT Urinalysis Dipstick - Urine Culture   Leeanne Rio, PA-C

## 2017-09-04 NOTE — Telephone Encounter (Signed)
Will see her at appointment. Thanks for the heads up.

## 2017-09-04 NOTE — Patient Instructions (Signed)
Your symptoms are consistent with a bladder infection, also called acute cystitis. Please take your antibiotic (Keflex) as directed until all pills are gone.  Stay very well hydrated.  Consider a daily probiotic (Align, Culturelle, or Activia) to help prevent stomach upset caused by the antibiotic.  Taking a probiotic daily may also help prevent recurrent UTIs.  Also consider taking AZO (Phenazopyridine) tablets to help decrease pain with urination.  I will call you with your urine testing results.  We will change antibiotics if indicated.  Call or return to clinic if symptoms are not resolved by completion of antibiotic.   Urinary Tract Infection A urinary tract infection (UTI) can occur any place along the urinary tract. The tract includes the kidneys, ureters, bladder, and urethra. A type of germ called bacteria often causes a UTI. UTIs are often helped with antibiotic medicine.  HOME CARE   If given, take antibiotics as told by your doctor. Finish them even if you start to feel better.  Drink enough fluids to keep your pee (urine) clear or pale yellow.  Avoid tea, drinks with caffeine, and bubbly (carbonated) drinks.  Pee often. Avoid holding your pee in for a long time.  Pee before and after having sex (intercourse).  Wipe from front to back after you poop (bowel movement) if you are a woman. Use each tissue only once. GET HELP RIGHT AWAY IF:   You have back pain.  You have lower belly (abdominal) pain.  You have chills.  You feel sick to your stomach (nauseous).  You throw up (vomit).  Your burning or discomfort with peeing does not go away.  You have a fever.  Your symptoms are not better in 3 days. MAKE SURE YOU:   Understand these instructions.  Will watch your condition.  Will get help right away if you are not doing well or get worse. Document Released: 10/12/2007 Document Revised: 01/18/2012 Document Reviewed: 11/24/2011 Exit[employer] Patient Information 2015  [employer]. This information is not intended to replace advice given to you by your health care provider. Make sure you discuss any questions you have with your health care provider.   

## 2017-09-04 NOTE — Telephone Encounter (Signed)
Returned call. No answer.  

## 2017-09-06 LAB — URINE CULTURE
MICRO NUMBER:: 90519512
SPECIMEN QUALITY:: ADEQUATE

## 2017-09-08 ENCOUNTER — Other Ambulatory Visit: Payer: Self-pay

## 2017-09-08 ENCOUNTER — Encounter: Payer: Self-pay | Admitting: Family Medicine

## 2017-09-08 ENCOUNTER — Ambulatory Visit (INDEPENDENT_AMBULATORY_CARE_PROVIDER_SITE_OTHER): Payer: Medicare HMO | Admitting: Family Medicine

## 2017-09-08 VITALS — BP 132/80 | HR 77 | Temp 98.0°F | Ht 63.0 in | Wt 177.6 lb

## 2017-09-08 DIAGNOSIS — M542 Cervicalgia: Secondary | ICD-10-CM

## 2017-09-08 NOTE — Progress Notes (Signed)
Subjective  CC:  Chief Complaint  Patient presents with  . Cyst    Knot to back of neck on the left side, sore. Noticed knot this morning     HPI: Dana [name] is a 72 y.o. female who presents to the office today to address the problems listed above in the chief complaint.  Acute visit: pt awoke and felt like there was a 'knot' on the right side of her upper neck. Feels a small "knot" that is sore. No injury. No pain with mvt. No rash.   I reviewed the patients updated PMH, FH, and SocHx.    Patient Active Problem List   Diagnosis Date Noted  . Amnesia memory loss 05/19/2017  . Fever 05/19/2017  . Acute viral syndrome 05/19/2017  . Obesity, Class I, BMI 30.0-34.9 (see actual BMI) 05/09/2017  . Acute respiratory infection 05/08/2017  . Lumbar herniated disc 02/01/2017  . Left shoulder pain 09/30/2016  . Acute sinusitis 08/09/2016  . Medicare annual wellness visit, subsequent 05/03/2016  . 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 Kidney stone   . Cervical pain (neck) 12/04/2015  . Encounter for well adult exam with abnormal findings 04/29/2014  . Right hand paresthesia 04/29/2014  . Advanced care planning/counseling discussion 04/29/2014  . PFD (pelvic floor dysfunction) 04/29/2014  . Controlled type 2 diabetes mellitus without complication, without long-term current use of insulin ([place]) 12/24/2013  . 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  . Hx of diverticulitis of colon 11/07/2012  . Allergic rhinitis 11/07/2012  . HLD (hyperlipidemia) 11/07/2012  . Migraine 11/07/2012  . Hereditary and idiopathic peripheral neuropathy 11/07/2012  . LGSIL Pap smear of vaginal cuff 11/07/2012  . GERD (gastroesophageal reflux disease) 11/07/2012  . Family history of premature coronary heart disease 11/07/2012   Current Meds  Medication Sig  . acetaminophen (TYLENOL) 325 MG tablet Take 650 mg by mouth every 6 (six) hours as needed for mild pain.   Marland Kitchen aspirin 81 MG tablet Take 81 mg by mouth daily.    Marland Kitchen aspirin-acetaminophen-caffeine (EXCEDRIN MIGRAINE) 250-250-65 MG tablet Take 1 tablet by mouth every 6 (six) hours as needed for headache or migraine.  . cephALEXin (KEFLEX) 500 MG capsule Take 1 capsule (500 mg total) by mouth 2 (two) times daily for 7 days.  Marland Kitchen co-enzyme Q-10 30 MG capsule Take 30 mg by mouth 3 (three) times daily.  . fluticasone (FLONASE) 50 MCG/ACT nasal spray USE 2 SPRAYS IN EACH NOSTRIL EVERY DAY  . furosemide (LASIX) 20 MG tablet Take 1 tablet (20 mg total) by mouth daily as needed for fluid.  Marland Kitchen lovastatin (MEVACOR) 40 MG tablet TAKE 1 TABLET AT BEDTIME  . methocarbamol (ROBAXIN) 500 MG tablet Take 1 tablet (500 mg total) by mouth 3 (three) times daily as needed for muscle spasms.  . Multiple Vitamin (MULTIVITAMIN) tablet Take 1 tablet by mouth daily.  Marland Kitchen omeprazole (PRILOSEC) 20 MG capsule TAKE 1 CAPSULE EVERY DAY  . vitamin B-12 (CYANOCOBALAMIN) 1000 MCG tablet Take 1,000 mcg by mouth daily.   Past Medical History:  Diagnosis Date  . Allergy   . Anemia   . Arthritis   . Cataract   . Controlled type 2 diabetes mellitus without complication, without long-term current use of insulin ([place])   . Diverticulitis   . History of chicken pox   . Hyperlipidemia   . Kidney stone   . 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 Migraine   . Urge incontinence 04/29/2014    Allergies:  Patient is allergic to amitriptyline; oxybutynin; tramadol; niacin and related; and septra [sulfamethoxazole-trimethoprim]. Family History: Patient family history includes Diabetes in her brother and father; Fibromyalgia in her sister; Heart disease in her brother and father; Heart disease (age of onset: 2) in her mother; Hyperlipidemia in her father, mother, and sister; Stroke in her father. Social History:  Patient  reports that she has never smoked. She has never used smokeless tobacco. She reports that she does not drink alcohol or use drugs.  Review of Systems: Constitutional: Negative for fever  malaise or anorexia Cardiovascular: negative for chest pain Respiratory: negative for SOB or persistent cough Gastrointestinal: negative for abdominal pain  Objective  Vitals: BP 132/80   Pulse 77   Temp 98 F (36.7 C)   Ht 5\' 3"  (1.6 m)   Wt 177 lb 9.6 oz (80.6 kg)   BMI 31.46 kg/m  General: no acute distress , A&Ox3 HEENT: supple neck: pt is feeling the base of her bony skull; no mass present, has localized ttp at this point where strap mm insert. No rash. Findings of "swelling" are symmetric, no ant or post cervical lad Skin:  Warm, no rashes  Assessment  1. Neck pain      Plan   Neck pain:  Reassured; no mass on exam. Likely MSK pain; trial of advil or tylenol and monitor for resolution. F/u for further eval if pain worsens.  Follow up: Return if symptoms worsen or fail to improve.    Commons side effects, risks, benefits, and alternatives for medications and treatment plan prescribed today were discussed, and the patient expressed understanding of the given instructions. Patient is instructed to call or message via MyChart if he/she has any questions or concerns regarding our treatment plan. No barriers to understanding were identified. We discussed Red Flag symptoms and signs in detail. Patient expressed understanding regarding what to do in case of urgent or emergency type symptoms.   Medication list was reconciled, printed and provided to the patient in AVS. Patient instructions and summary information was reviewed with the patient as documented in the AVS. This note was prepared with assistance of Dragon voice recognition software. Occasional wrong-word or sound-a-like substitutions may have occurred due to the inherent limitations of voice recognition software  No orders of the defined types were placed in this encounter.  No orders of the defined types were placed in this encounter.

## 2017-09-08 NOTE — Patient Instructions (Signed)
Please follow up if symptoms do not improve or as needed.   Trial of tylenol or advil for the soreness; let us know if it does not resolve.

## 2017-09-09 ENCOUNTER — Ambulatory Visit: Payer: Commercial Managed Care - HMO | Admitting: Family Medicine

## 2017-09-14 DIAGNOSIS — M5136 Other intervertebral disc degeneration, lumbar region: Secondary | ICD-10-CM | POA: Diagnosis not present

## 2017-09-14 DIAGNOSIS — M5416 Radiculopathy, lumbar region: Secondary | ICD-10-CM | POA: Diagnosis not present

## 2017-09-26 ENCOUNTER — Other Ambulatory Visit: Payer: Self-pay

## 2017-09-26 NOTE — Telephone Encounter (Signed)
Patient in office today with spouse. Requested refill of Methocarbamol to be sent to [facility].

## 2017-09-28 MED ORDER — METHOCARBAMOL 500 MG PO TABS: 500.0000 mg | ORAL_TABLET | Freq: Three times a day (TID) | ORAL | 0 refills | Status: DC | PRN

## 2017-09-28 NOTE — Telephone Encounter (Signed)
Contacted patient to advise medication refill request has been approved by PCP and sent to pharmacy of choice.

## 2017-10-07 HISTORY — PX: LUMBAR EPIDURAL INJECTION: SHX1980

## 2017-10-16 DIAGNOSIS — M5136 Other intervertebral disc degeneration, lumbar region: Secondary | ICD-10-CM | POA: Diagnosis not present

## 2017-10-16 DIAGNOSIS — M5416 Radiculopathy, lumbar region: Secondary | ICD-10-CM | POA: Diagnosis not present

## 2017-10-29 ENCOUNTER — Encounter: Payer: Self-pay | Admitting: Family Medicine

## 2017-11-06 ENCOUNTER — Encounter: Payer: Self-pay | Admitting: Family Medicine

## 2017-11-06 ENCOUNTER — Ambulatory Visit (INDEPENDENT_AMBULATORY_CARE_PROVIDER_SITE_OTHER): Payer: Medicare HMO | Admitting: Family Medicine

## 2017-11-06 VITALS — BP 140/74 | HR 73 | Temp 97.8°F | Ht 63.0 in | Wt 178.5 lb

## 2017-11-06 DIAGNOSIS — M5126 Other intervertebral disc displacement, lumbar region: Secondary | ICD-10-CM

## 2017-11-06 DIAGNOSIS — E119 Type 2 diabetes mellitus without complications: Secondary | ICD-10-CM | POA: Diagnosis not present

## 2017-11-06 DIAGNOSIS — E669 Obesity, unspecified: Secondary | ICD-10-CM

## 2017-11-06 LAB — MICROALBUMIN / CREATININE URINE RATIO
CREATININE, U: 91.8 mg/dL
MICROALB UR: 23.1 mg/dL — AB (ref 0.0–1.9)
Microalb Creat Ratio: 25.2 mg/g (ref 0.0–30.0)

## 2017-11-06 LAB — POCT GLYCOSYLATED HEMOGLOBIN (HGB A1C): HEMOGLOBIN A1C: 6.4 % — AB (ref 4.0–5.6)

## 2017-11-06 MED ORDER — FUROSEMIDE 20 MG PO TABS: 20.0000 mg | ORAL_TABLET | Freq: Every day | ORAL | 1 refills | Status: DC | PRN

## 2017-11-06 NOTE — Patient Instructions (Signed)
A1c looking better! Continue healthy low sugar low carb diet. Work on walking routine.  Urine test today.  Good to see you, call us with questions. Return in 6 months for physical.

## 2017-11-06 NOTE — Progress Notes (Addendum)
BP 140/74 (BP Location: Left Arm, Patient Position: Sitting, Cuff Size: Normal)   Pulse 73   Temp 97.8 F (36.6 C) (Oral)   Ht 5\' 3"  (1.6 m)   Wt 178 lb 8 oz (81 kg)   SpO2 97%   BMI 31.62 kg/m    CC: 9mo DM f/u visit Subjective:    Patient ID: Dana [name], female    DOB: June 28, 1945, 72 y.o.   MRN: [id number]  HPI: Dana [name] is a 72 y.o. female presenting on 11/06/2017 for Diabetes (Here for 6 mo f/u.)   DM - does regularly check fasting sugars at least once a week: this morning 106. Compliant with antihyperglycemic regimen which includes: diet controlled. Trying to cut out ice cream at night. Denies low sugars or hypoglycemic symptoms. Denies paresthesias. Last diabetic eye exam 04/2017. Pneumovax: 2017. Prevnar: 2015. Glucometer brand: not sure. DSME: 07/2017 x1 class, didn't complete classes.  Lab Results  Component Value Date   HGBA1C 6.4 (A) 11/06/2017   Diabetic Foot Exam - Simple   No data filed     No results found for: MICROALBUR, [id number]   Lumbar back pain - had epidural steroid shots x2. Back pain has subsided some.   Relevant past medical, surgical, family and social history reviewed and updated as indicated. Interim medical history since our last visit reviewed. Allergies and medications reviewed and updated. Outpatient Medications Prior to Visit  Medication Sig Dispense Refill  . acetaminophen (TYLENOL) 325 MG tablet Take 650 mg by mouth every 6 (six) hours as needed for mild pain.     Marland Kitchen aspirin 81 MG tablet Take 81 mg by mouth daily.    Marland Kitchen aspirin-acetaminophen-caffeine (EXCEDRIN MIGRAINE) 250-250-65 MG tablet Take 1 tablet by mouth every 6 (six) hours as needed for headache or migraine. 30 tablet 0  . co-enzyme Q-10 30 MG capsule Take 30 mg by mouth 3 (three) times daily.    . fluticasone (FLONASE) 50 MCG/ACT nasal spray USE 2 SPRAYS IN EACH NOSTRIL EVERY DAY 48 g 3  . lovastatin (MEVACOR) 40 MG tablet TAKE 1 TABLET AT BEDTIME 90 tablet 3  .  methocarbamol (ROBAXIN) 500 MG tablet Take 1 tablet (500 mg total) by mouth 3 (three) times daily as needed for muscle spasms. 60 tablet 0  . Multiple Vitamin (MULTIVITAMIN) tablet Take 1 tablet by mouth daily.    Marland Kitchen omeprazole (PRILOSEC) 20 MG capsule TAKE 1 CAPSULE EVERY DAY 90 capsule 3  . vitamin B-12 (CYANOCOBALAMIN) 1000 MCG tablet Take 1,000 mcg by mouth daily.    . furosemide (LASIX) 20 MG tablet Take 1 tablet (20 mg total) by mouth daily as needed for fluid. 30 tablet 1   No facility-administered medications prior to visit.      Per HPI unless specifically indicated in ROS section below Review of Systems     Objective:    BP 140/74 (BP Location: Left Arm, Patient Position: Sitting, Cuff Size: Normal)   Pulse 73   Temp 97.8 F (36.6 C) (Oral)   Ht 5\' 3"  (1.6 m)   Wt 178 lb 8 oz (81 kg)   SpO2 97%   BMI 31.62 kg/m   Wt Readings from Last 3 Encounters:  11/06/17 178 lb 8 oz (81 kg)  09/08/17 177 lb 9.6 oz (80.6 kg)  09/04/17 177 lb (80.3 kg)    Physical Exam  Constitutional: She appears well-developed and well-nourished. No distress.  HENT:  Head: Normocephalic and atraumatic.  Right Ear: External ear  normal.  Left Ear: External ear normal.  Nose: Nose normal.  Mouth/Throat: Oropharynx is clear and moist. No oropharyngeal exudate.  Eyes: Pupils are equal, round, and reactive to light. Conjunctivae and EOM are normal. No scleral icterus.  Neck: Normal range of motion. Neck supple.  Cardiovascular: Normal rate, regular rhythm, normal heart sounds and intact distal pulses.  No murmur heard. Pulmonary/Chest: Effort normal and breath sounds normal. No respiratory distress. She has no wheezes. She has no rales.  Musculoskeletal: She exhibits no edema.  See HPI for foot exam if done  Lymphadenopathy:    She has no cervical adenopathy.  Skin: Skin is warm and dry. No rash noted.  Psychiatric: She has a normal mood and affect.  Nursing note and vitals reviewed.  Results  for orders placed or performed in visit on 11/06/17  POCT glycosylated hemoglobin (Hb A1C)  Result Value Ref Range   Hemoglobin A1C 6.4 (A) 4.0 - 5.6 %   HbA1c POC (<> result, manual entry)  4.0 - 5.6 %   HbA1c, POC (prediabetic range)  5.7 - 6.4 %   HbA1c, POC (controlled diabetic range)  0.0 - 7.0 %      Assessment & Plan:   Problem List Items Addressed This Visit    Obesity, Class I, BMI 30.0-34.9 (see actual BMI)    Continue to encourage weight loss efforts through healthy diet and regular exercise       Lumbar herniated disc    Stable period after course of PT then epidural steroid injections x2. Appreciate PM&R care.       Controlled type 2 diabetes mellitus without complication, without long-term current use of insulin ([facility]) - Primary    Chronic, improved readings based on A1c today. Congratulated on healthy diet choices. RTC 25mo f/u visit.       Relevant Orders   POCT glycosylated hemoglobin (Hb A1C) (Completed)   Microalbumin / creatinine urine ratio       Meds ordered this encounter  Medications  . furosemide (LASIX) 20 MG tablet    Sig: Take 1 tablet (20 mg total) by mouth daily as needed for fluid.    Dispense:  30 tablet    Refill:  1   Orders Placed This Encounter  Procedures  . Microalbumin / creatinine urine ratio  . POCT glycosylated hemoglobin (Hb A1C)    Follow up plan: Return in about 6 months (around 05/09/2018) for annual exam, prior fasting for blood work, medicare wellness visit.  [name], MD

## 2017-11-06 NOTE — Assessment & Plan Note (Signed)
Chronic, improved readings based on A1c today. Congratulated on healthy diet choices. RTC 31mo f/u visit.

## 2017-11-06 NOTE — Assessment & Plan Note (Signed)
Stable period after course of PT then epidural steroid injections x2. Appreciate PM&R care.

## 2017-11-06 NOTE — Assessment & Plan Note (Signed)
Continue to encourage weight loss efforts through healthy diet and regular exercise

## 2017-11-24 ENCOUNTER — Other Ambulatory Visit: Payer: Self-pay | Admitting: Family Medicine

## 2017-11-24 NOTE — Telephone Encounter (Signed)
Methocarbamol Last filled:  09/28/17, #60 Last OV:  11/06/17 Next OV (CPE): 05/14/18

## 2017-12-21 IMAGING — DX DG SHOULDER 2+V*L*
3 series · 3 of 3 positions shown · non-contrast
Comparison: 09/30/2016

CLINICAL DATA: Left upper arm pain for 2 months.

EXAM:
LEFT SHOULDER - 2+ VIEW

[shoulder axial]
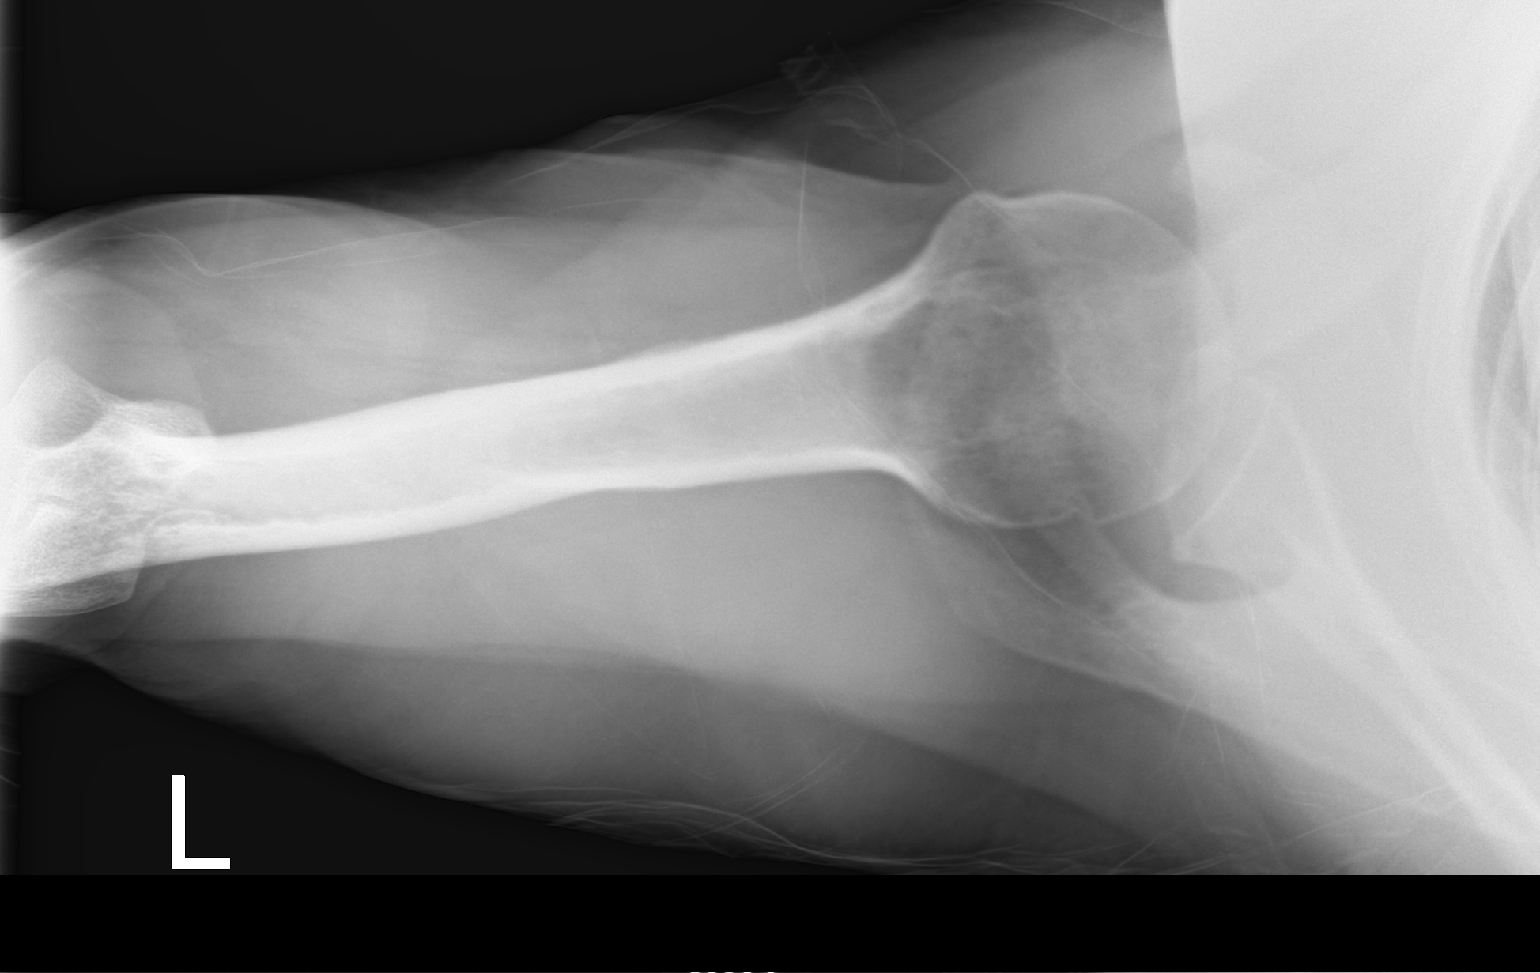

[shoulder ap]
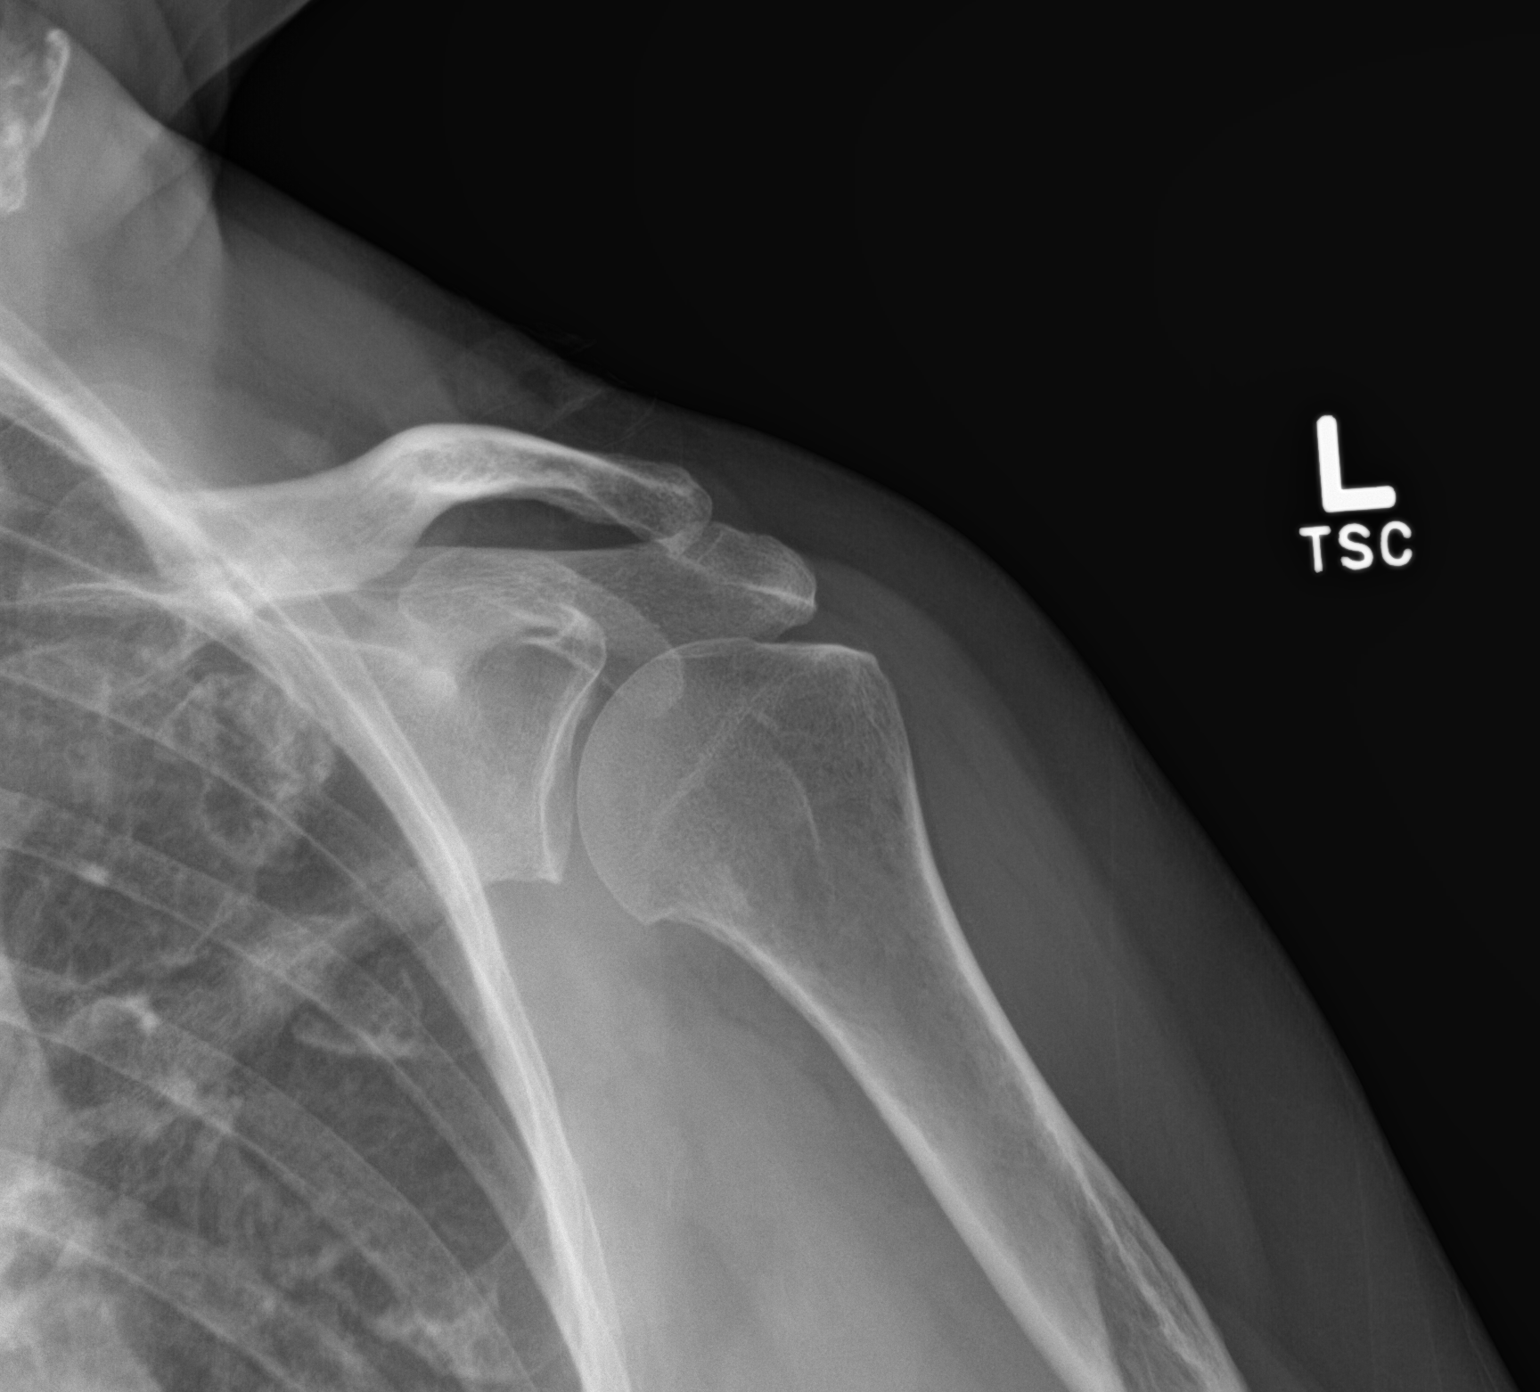

[shoulder y-view]
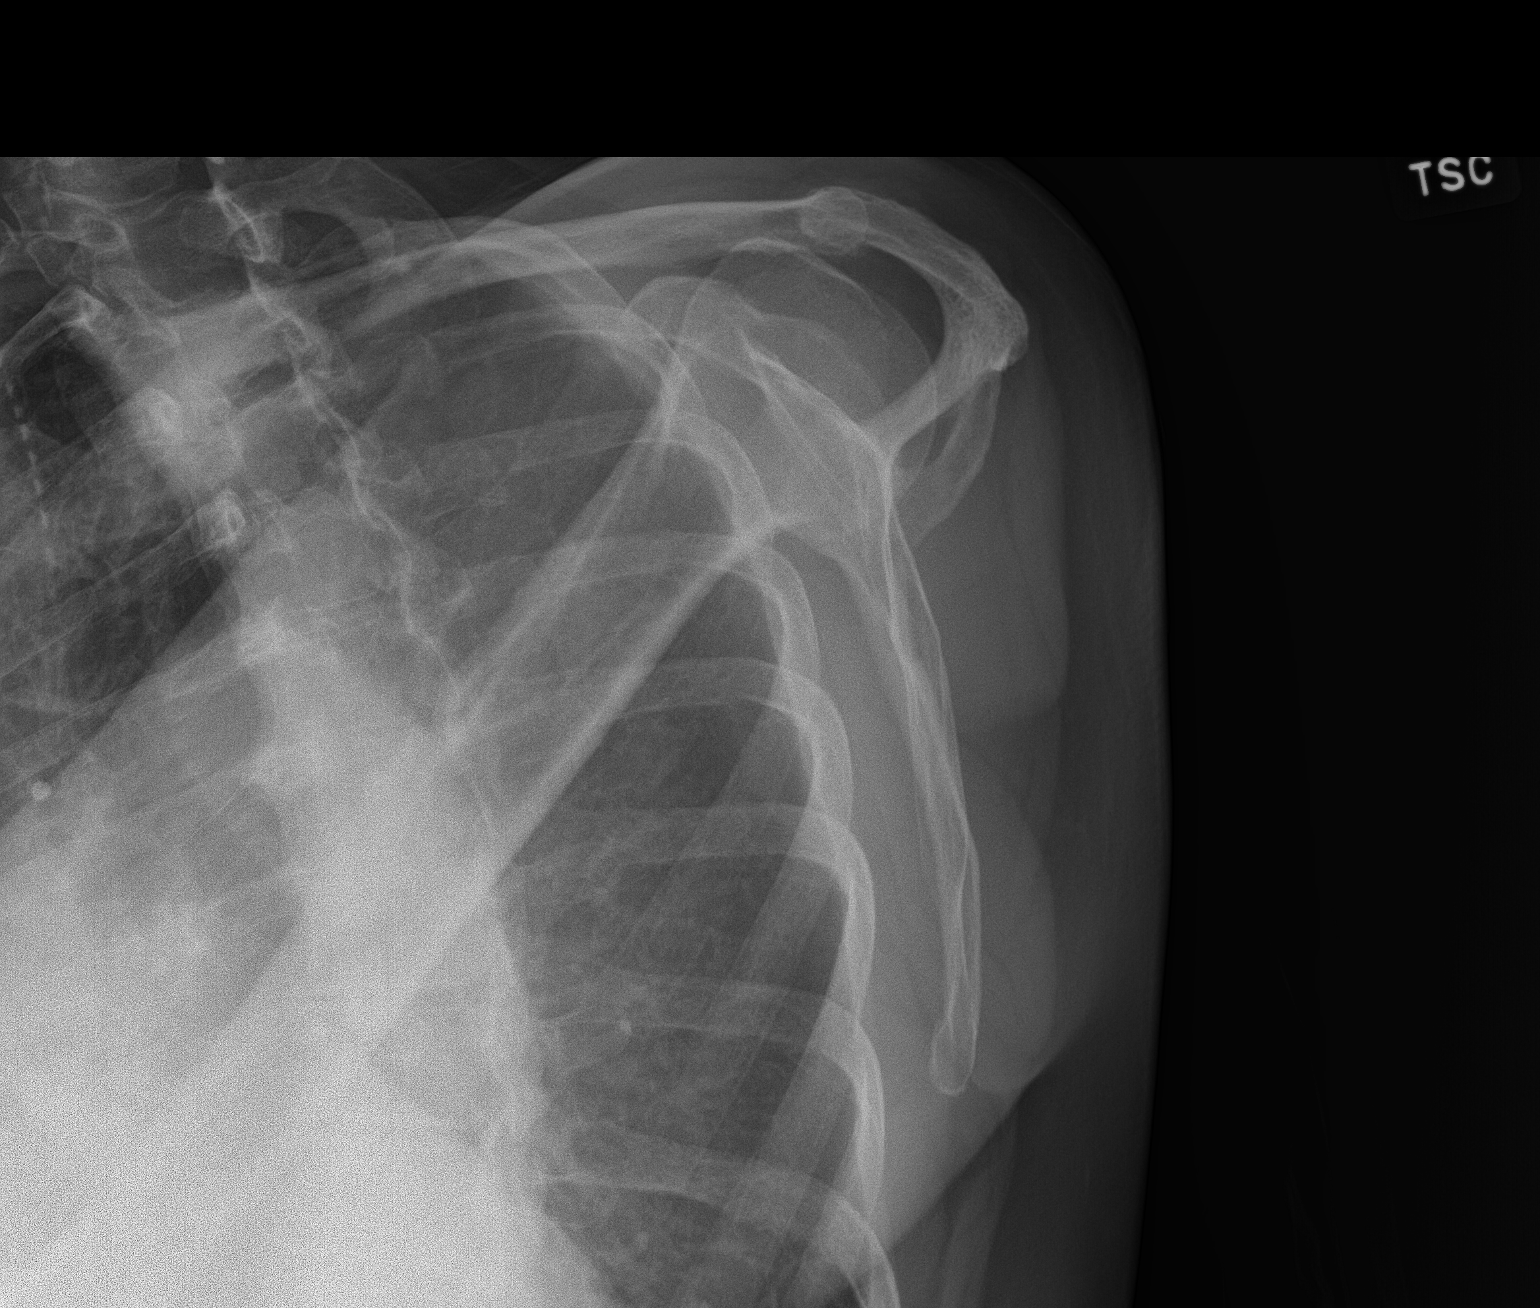

[3 of 3 positions shown; findings below may reference images not displayed]

FINDINGS: Mild degenerative changes in the AC joint with joint space narrowing
and spurring. Glenohumeral joint is maintained. No acute bony
abnormality. Specifically, no fracture, subluxation, or dislocation.
Soft tissues are intact.
IMPRESSION: No acute findings.

## 2018-02-20 ENCOUNTER — Ambulatory Visit (INDEPENDENT_AMBULATORY_CARE_PROVIDER_SITE_OTHER): Payer: Medicare HMO | Admitting: Family Medicine

## 2018-02-20 ENCOUNTER — Encounter: Payer: Self-pay | Admitting: Family Medicine

## 2018-02-20 VITALS — BP 118/64 | HR 78 | Temp 98.1°F | Ht 63.0 in | Wt 183.0 lb

## 2018-02-20 DIAGNOSIS — L282 Other prurigo: Secondary | ICD-10-CM | POA: Diagnosis not present

## 2018-02-20 DIAGNOSIS — L03116 Cellulitis of left lower limb: Secondary | ICD-10-CM | POA: Diagnosis not present

## 2018-02-20 DIAGNOSIS — S80869A Insect bite (nonvenomous), unspecified lower leg, initial encounter: Secondary | ICD-10-CM

## 2018-02-20 DIAGNOSIS — W57XXXA Bitten or stung by nonvenomous insect and other nonvenomous arthropods, initial encounter: Secondary | ICD-10-CM

## 2018-02-20 MED ORDER — CEPHALEXIN 500 MG PO CAPS: 500.0000 mg | ORAL_CAPSULE | Freq: Two times a day (BID) | ORAL | 0 refills | Status: AC

## 2018-02-20 MED ORDER — TRIAMCINOLONE ACETONIDE 0.1 % EX CREA: 1.0000 "application " | TOPICAL_CREAM | Freq: Two times a day (BID) | CUTANEOUS | 0 refills | Status: DC

## 2018-02-20 MED ORDER — PREDNISONE 5 MG PO TABS: 5.0000 mg | ORAL_TABLET | Freq: Every day | ORAL | 0 refills | Status: AC

## 2018-02-20 NOTE — Progress Notes (Signed)
Subjective:    Patient ID: Dana [name], female    DOB: 1946/02/11, 72 y.o.   MRN: [id number]  HPI  Presents to clinic c/o rash on both legs that began yesterday.  States the small red bumps are mostly on bilateral ankles.  Denies any new soaps, lotions, detergents.  Was walking around outside with her ankle showing, it is possible she was bitten by insects.  Denies any possibility of fleas or bedbugs.  No one in her household has similar rash.  States the rash is very itchy and she has scratched the one especially on the left ankle causing some increased redness.  Patient Active Problem List   Diagnosis Date Noted  . Amnesia memory loss 05/19/2017  . Obesity, Class I, BMI 30.0-34.9 (see actual BMI) 05/09/2017  . Lumbar herniated disc 02/01/2017  . Left shoulder pain 09/30/2016  . Medicare annual wellness visit, subsequent 05/03/2016  . 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 Kidney stone   . Cervical pain (neck) 12/04/2015  . Encounter for well adult exam with abnormal findings 04/29/2014  . Right hand paresthesia 04/29/2014  . Advanced care planning/counseling discussion 04/29/2014  . PFD (pelvic floor dysfunction) 04/29/2014  . Controlled type 2 diabetes mellitus without complication, without long-term current use of insulin (Dana [name]) 12/24/2013  . 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  . Hx of diverticulitis of colon 11/07/2012  . Allergic rhinitis 11/07/2012  . HLD (hyperlipidemia) 11/07/2012  . Migraine 11/07/2012  . Hereditary and idiopathic peripheral neuropathy 11/07/2012  . LGSIL Pap smear of vaginal cuff 11/07/2012  . GERD (gastroesophageal reflux disease) 11/07/2012  . Family history of premature coronary heart disease 11/07/2012   Social History   Tobacco Use  . Smoking status: Never Smoker  . Smokeless tobacco: Never Used  Substance Use Topics  . Alcohol use: No    Alcohol/week: 0.0 standard drinks   Review of Systems  Constitutional: Negative for chills, fatigue and  fever.  HENT: Negative for congestion, ear pain, sinus pain and sore throat.   Eyes: Negative.   Respiratory: Negative for cough, shortness of breath and wheezing.   Cardiovascular: Negative for chest pain, palpitations and leg swelling.  Gastrointestinal: Negative for abdominal pain, diarrhea, nausea and vomiting.  Genitourinary: Negative for dysuria, frequency and urgency.  Musculoskeletal: Negative for arthralgias and myalgias.  Skin: +rash bilat LEs/bug bites? Neurological: Negative for syncope, light-headedness and headaches.  Psychiatric/Behavioral: The patient is not nervous/anxious.       Objective:   Physical Exam  Constitutional: She is oriented to person, place, and time. She appears well-nourished. No distress.  HENT:  Head: Normocephalic and atraumatic.  Eyes: EOM are normal. No scleral icterus.  Neck: Neck supple. No tracheal deviation present.  Cardiovascular: Normal rate and regular rhythm.  Pulmonary/Chest: Effort normal and breath sounds normal. No respiratory distress.  Musculoskeletal: She exhibits no edema.  Neurological: She is alert and oriented to person, place, and time.  Skin: Skin is warm and dry. There is erythema.  1 small bite (about size of pea) looking area on left ankle, surrounding skin red and warm to touch. 2 small bite looking area on right ankle, no additional surrounding skin redness.   Psychiatric: She has a normal mood and affect. Her behavior is normal.  Nursing note and vitals reviewed.     Vitals:   02/20/18 1451  BP: 118/64  Pulse: 78  Temp: 98.1 F (36.7 C)  SpO2: 97%    Assessment & Plan:   Allergic rash/possible  insect bite - areas on bilateral ankles do appear consistent with insect bites such as from mosquito.  Patient will take oral prednisone for 5 days, and use topical triamcinolone cream to help calm down itching response.  Cellulitis of left lower extremity - surrounding redness has occurred near the bite of the left  lower ankle.  We will treat with Keflex twice daily for 7 days due to cellulitis.   Keep regular follow-up with PCP as already scheduled.  Return to clinic sooner if any issues arise.

## 2018-02-20 NOTE — Progress Notes (Signed)
l °

## 2018-02-21 ENCOUNTER — Encounter: Payer: Self-pay | Admitting: Family Medicine

## 2018-03-19 ENCOUNTER — Encounter: Payer: Self-pay | Admitting: Internal Medicine

## 2018-03-19 ENCOUNTER — Ambulatory Visit (INDEPENDENT_AMBULATORY_CARE_PROVIDER_SITE_OTHER): Payer: Medicare HMO | Admitting: Internal Medicine

## 2018-03-19 VITALS — BP 128/82 | HR 69 | Temp 97.9°F | Wt 184.0 lb

## 2018-03-19 DIAGNOSIS — R05 Cough: Secondary | ICD-10-CM | POA: Diagnosis not present

## 2018-03-19 DIAGNOSIS — J302 Other seasonal allergic rhinitis: Secondary | ICD-10-CM | POA: Diagnosis not present

## 2018-03-19 DIAGNOSIS — R059 Cough, unspecified: Secondary | ICD-10-CM

## 2018-03-19 MED ORDER — BENZONATATE 200 MG PO CAPS: 200.0000 mg | ORAL_CAPSULE | Freq: Three times a day (TID) | ORAL | 0 refills | Status: DC | PRN

## 2018-03-19 NOTE — Patient Instructions (Signed)

## 2018-03-20 ENCOUNTER — Encounter: Payer: Self-pay | Admitting: Internal Medicine

## 2018-03-20 NOTE — Progress Notes (Signed)
HPI  Pt presents to the clinic today with c/o itchy eyes, nasal congestion, scratchy throat and cough.  She reports this started 2 to 3 days ago in she is not able to blow anything out of her nose.  She is swallowing.  The cough is nonproductive.  She denies fever, chills or body aches.  She has a history of allergies and is taking Claritin and Flonase with some relief.  She is also tried Tylenol cough and cold OTC.  She has not had sick contacts.  Review of Systems      Past Medical History:  Diagnosis Date  . Allergy   . Anemia   . Arthritis   . Cataract   . Controlled type 2 diabetes mellitus without complication, without long-term current use of insulin ([place])   . Diverticulitis   . History of chicken pox   . Hyperlipidemia   . Kidney stone   . 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 Migraine   . Urge incontinence 04/29/2014    Family History  Problem Relation Age of Onset  . Heart disease Mother 17  . Hyperlipidemia Mother   . Hyperlipidemia Father   . Stroke Father   . Heart disease Father   . Diabetes Father   . Fibromyalgia Sister   . Hyperlipidemia Sister   . Heart disease Brother   . Diabetes Brother     Social History   Socioeconomic History  . Marital status: Married    Spouse name: [name]  . Number of children: 1  . Years of education: 78  . Highest education level: Not on file  Occupational History    Comment: Retired  Scientific laboratory technician  . Financial resource strain: Not on file  . Food insecurity:    Worry: Not on file    Inability: Not on file  . Transportation needs:    Medical: Not on file    Non-medical: Not on file  Tobacco Use  . Smoking status: Never Smoker  . Smokeless tobacco: Never Used  Substance and Sexual Activity  . Alcohol use: No    Alcohol/week: 0.0 standard drinks  . Drug use: No  . Sexual activity: Not on file  Lifestyle  . Physical activity:    Days per week: Not on file    Minutes per session: Not on file  . Stress: Not on file   Relationships  . Social connections:    Talks on phone: Not on file    Gets together: Not on file    Attends religious service: Not on file    Active member of club or organization: Not on file    Attends meetings of clubs or organizations: Not on file    Relationship status: Not on file  . Intimate partner violence:    Fear of current or ex partner: Not on file    Emotionally abused: Not on file    Physically abused: Not on file    Forced sexual activity: Not on file  Other Topics Concern  . Not on file  Social History Narrative   Patient is married and lives at home with her husband [name]). Patient is retired.    Edu: highschool education.   Right handed.   Exercise 3 times a week.   Diet: good water, fruits/vegetables daily   Caffeine: None      HAs living will, HCPOA, full code    Allergies  Allergen Reactions  . Amitriptyline Other (See Comments)    Worsened migraine  and confusion  . Oxybutynin Other (See Comments)    Urinary retention/hesitancy  . Tramadol Other (See Comments)    Causes confusion and dizziness  . Niacin And Related Itching  . Septra [Sulfamethoxazole-Trimethoprim] Itching     Constitutional:  Denies headache, fatigue, fever or abrupt weight changes.  HEENT:  Positive itchy eyes, nasal congestion and scratchy throat. Denies eye redness, eye pain, pressure behind the eyes, facial pain, ear pain, ringing in the ears, wax buildup, runny nose or bloody nose. Respiratory: Positive cough. Denies difficulty breathing or shortness of breath.  Cardiovascular: Denies chest pain, chest tightness, palpitations or swelling in the hands or feet.   No other specific complaints in a complete review of systems (except as listed in HPI above).  Objective:   BP 128/82   Pulse 69   Temp 97.9 F (36.6 C) (Oral)   Wt 184 lb (83.5 kg)   SpO2 98%   BMI 32.59 kg/m  Wt Readings from Last 3 Encounters:  03/19/18 184 lb (83.5 kg)  02/20/18 183 lb (83 kg)   11/06/17 178 lb 8 oz (81 kg)     General: Appears her stated age, well developed, well nourished in NAD. HEENT: Head: normal shape and size, no sinus tenderness noted;  Ears: Tm's gray and intact, normal light reflex; Nose: mucosa pink and moist, septum midline; Throat/Mouth: + PND. Teeth present, mucosa pink and moist, no exudate noted, no lesions or ulcerations noted.  Neck: No cervical lymphadenopathy.  Cardiovascular: Normal rate and rhythm. Pulmonary/Chest: Normal effort and positive vesicular breath sounds. No respiratory distress. No wheezes, rales or ronchi noted.       Assessment & Plan:   Allergic Rhinitis:  Get some rest and drink plenty of water Do salt water gargles for the sore throat Continue Claritin and Flonase E Rx for Tessalon 200 mg 3 times daily as needed for cough  RTC as needed or if symptoms persist.   Webb Silversmith, NP

## 2018-04-21 DIAGNOSIS — G43009 Migraine without aura, not intractable, without status migrainosus: Secondary | ICD-10-CM | POA: Diagnosis not present

## 2018-04-22 ENCOUNTER — Other Ambulatory Visit: Payer: Self-pay

## 2018-04-22 ENCOUNTER — Emergency Department (HOSPITAL_COMMUNITY)
Admission: EM | Admit: 2018-04-22 | Discharge: 2018-04-22 | Disposition: A | Discharge status: Home / Self Care | Payer: Medicare HMO | Attending: Emergency Medicine | Admitting: Emergency Medicine

## 2018-04-22 ENCOUNTER — Emergency Department (HOSPITAL_COMMUNITY): Payer: Medicare HMO

## 2018-04-22 DIAGNOSIS — E119 Type 2 diabetes mellitus without complications: Secondary | ICD-10-CM | POA: Insufficient documentation

## 2018-04-22 DIAGNOSIS — Z7982 Long term (current) use of aspirin: Secondary | ICD-10-CM | POA: Diagnosis not present

## 2018-04-22 DIAGNOSIS — R51 Headache: Secondary | ICD-10-CM | POA: Diagnosis not present

## 2018-04-22 DIAGNOSIS — Z79899 Other long term (current) drug therapy: Secondary | ICD-10-CM | POA: Insufficient documentation

## 2018-04-22 DIAGNOSIS — G43909 Migraine, unspecified, not intractable, without status migrainosus: Secondary | ICD-10-CM

## 2018-04-22 LAB — CBC WITH DIFFERENTIAL/PLATELET
Abs Immature Granulocytes: 0.01 10*3/uL (ref 0.00–0.07)
BASOS ABS: 0 10*3/uL (ref 0.0–0.1)
Basophils Relative: 0 %
EOS ABS: 0 10*3/uL (ref 0.0–0.5)
EOS PCT: 0 %
HCT: 41.9 % (ref 36.0–46.0)
Hemoglobin: 13.4 g/dL (ref 12.0–15.0)
Immature Granulocytes: 0 %
Lymphocytes Relative: 26 %
Lymphs Abs: 1.6 10*3/uL (ref 0.7–4.0)
MCH: 29.5 pg (ref 26.0–34.0)
MCHC: 32 g/dL (ref 30.0–36.0)
MCV: 92.3 fL (ref 80.0–100.0)
Monocytes Absolute: 0.5 10*3/uL (ref 0.1–1.0)
Monocytes Relative: 8 %
NRBC: 0 % (ref 0.0–0.2)
Neutro Abs: 4 10*3/uL (ref 1.7–7.7)
Neutrophils Relative %: 66 %
Platelets: 284 10*3/uL (ref 150–400)
RBC: 4.54 MIL/uL (ref 3.87–5.11)
RDW: 13.4 % (ref 11.5–15.5)
WBC: 6.2 10*3/uL (ref 4.0–10.5)

## 2018-04-22 LAB — BASIC METABOLIC PANEL
Anion gap: 14 (ref 5–15)
BUN: 10 mg/dL (ref 8–23)
CALCIUM: 9.4 mg/dL (ref 8.9–10.3)
CO2: 23 mmol/L (ref 22–32)
Chloride: 104 mmol/L (ref 98–111)
Creatinine, Ser: 0.79 mg/dL (ref 0.44–1.00)
GFR calc non Af Amer: >60 mL/min (ref >60)
Glucose, Bld: 130 mg/dL — ABNORMAL HIGH (ref 70–99)
Potassium: 4 mmol/L (ref 3.5–5.1)
SODIUM: 141 mmol/L (ref 135–145)

## 2018-04-22 MED ORDER — DIPHENHYDRAMINE HCL 50 MG/ML IJ SOLN
12.5000 mg | Freq: Once | INTRAMUSCULAR | Status: AC
  Administered 2018-04-22: 12.5 mg via INTRAVENOUS
  Filled 2018-04-22: qty 1

## 2018-04-22 MED ORDER — PROCHLORPERAZINE MALEATE 5 MG PO TABS
5.0000 mg | ORAL_TABLET | Freq: Once | ORAL | Status: AC
  Administered 2018-04-22: 5 mg via ORAL
  Filled 2018-04-22: qty 1

## 2018-04-22 MED ORDER — ONDANSETRON HCL 4 MG/2ML IJ SOLN
4.0000 mg | Freq: Once | INTRAMUSCULAR | Status: AC
  Administered 2018-04-22: 4 mg via INTRAVENOUS
  Filled 2018-04-22: qty 2

## 2018-04-22 MED ORDER — KETOROLAC TROMETHAMINE 15 MG/ML IJ SOLN
15.0000 mg | Freq: Once | INTRAMUSCULAR | Status: AC
  Administered 2018-04-22: 15 mg via INTRAVENOUS
  Filled 2018-04-22: qty 1

## 2018-04-22 NOTE — ED Notes (Signed)
Patient verbalizes understanding of discharge instructions. Opportunity for questioning and answers were provided. Armband removed by staff, pt discharged from ED. Pt wheeled to lobby. 

## 2018-04-22 NOTE — ED Provider Notes (Signed)
[facility] EMERGENCY DEPARTMENT Provider Note   CSN: [id number] Arrival date & time: 04/22/18  1252     History   Chief Complaint Chief Complaint  Patient presents with  . Headache    HPI Dana [name] is a 72 y.o. female.  72 year old female with prior medical history as detailed below presents for evaluation of headache.  Patient reports longstanding history of migraine headaches.  Patient's headaches are usually treated at home with Tylenol or Excedrin.  Symptoms today felt improved with those interventions.  She denies associated fever or vision change.  Patient denies focal weakness.  The history is provided by the patient and medical records.  Headache   This is a recurrent problem. The current episode started 12 to 24 hours ago. The problem occurs every few hours. The problem has not changed since onset.The headache is associated with nothing. The pain is located in the frontal and bilateral region. The quality of the pain is described as dull. The pain is mild. The pain does not radiate. Pertinent negatives include no fever.    Past Medical History:  Diagnosis Date  . Allergy   . Anemia   . Arthritis   . Cataract   . Controlled type 2 diabetes mellitus without complication, without long-term current use of insulin ([place])   . Diverticulitis   . History of chicken pox   . Hyperlipidemia   . Kidney stone   . 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 Migraine   . Urge incontinence 04/29/2014    Patient Active Problem List   Diagnosis Date Noted  . Amnesia memory loss 05/19/2017  . Obesity, Class I, BMI 30.0-34.9 (see actual BMI) 05/09/2017  . Lumbar herniated disc 02/01/2017  . Left shoulder pain 09/30/2016  . Medicare annual wellness visit, subsequent 05/03/2016  . 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 Kidney stone   . Cervical pain (neck) 12/04/2015  . Encounter for well adult exam with abnormal findings 04/29/2014  . Right hand paresthesia 04/29/2014    . Advanced care planning/counseling discussion 04/29/2014  . PFD (pelvic floor dysfunction) 04/29/2014  . Controlled type 2 diabetes mellitus without complication, without long-term current use of insulin ([place]) 12/24/2013  . 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  . Hx of diverticulitis of colon 11/07/2012  . Allergic rhinitis 11/07/2012  . HLD (hyperlipidemia) 11/07/2012  . Migraine 11/07/2012  . Hereditary and idiopathic peripheral neuropathy 11/07/2012  . LGSIL Pap smear of vaginal cuff 11/07/2012  . GERD (gastroesophageal reflux disease) 11/07/2012  . Family history of premature coronary heart disease 11/07/2012    Past Surgical History:  Procedure Laterality Date  . ABDOMINAL HYSTERECTOMY  2006   cervix remains, menorrhagia  . APPENDECTOMY  1970  . [place]   biopsy-left  . BREAST SURGERY  1995   right milk duct removed  . CARPAL TUNNEL RELEASE Right 02/2017  . CHOLECYSTECTOMY  1971  . COLONOSCOPY  11/2005   diverticulosis o/w WNL [facility])  . DEXA  2015   spine -0.4, hip -1.0 WNL  . LITHOTRIPSY    . LUMBAR EPIDURAL INJECTION Left 10/2017   transforaminal L L3/4 (Dr Maryjean Ka)  . TUBAL LIGATION       OB History   No obstetric history on file.      Home Medications    Prior to Admission medications   Medication Sig Start Date End Date Taking? Authorizing Provider  acetaminophen (TYLENOL) 325 MG tablet Take 650 mg by mouth every 6 (  six) hours as needed for mild pain.    Yes [provider]  aspirin 81 MG tablet Take 81 mg by mouth daily.   Yes [provider]  aspirin-acetaminophen-caffeine (EXCEDRIN MIGRAINE) [phone] MG tablet Take 1 tablet by mouth every 6 (six) hours as needed for headache or migraine. 05/21/17  Yes Patrecia Pour, Christean Grief, MD  co-enzyme Q-10 30 MG capsule Take 30 mg by mouth daily.    Yes [provider]  fluticasone (FLONASE) 50 MCG/ACT nasal spray USE 2 SPRAYS IN EACH NOSTRIL EVERY DAY Patient taking  differently: Place 2 sprays into both nostrils daily.  05/16/17  Yes [name], MD  furosemide (LASIX) 20 MG tablet Take 1 tablet (20 mg total) by mouth daily as needed for fluid. 11/06/17  Yes [name], MD  lovastatin (MEVACOR) 40 MG tablet TAKE 1 TABLET AT BEDTIME Patient taking differently: Take 40 mg by mouth daily.  05/16/17  Yes [name], MD  Multiple Vitamin (MULTIVITAMIN) tablet Take 1 tablet by mouth daily.   Yes [provider]  omeprazole (PRILOSEC) 20 MG capsule TAKE 1 CAPSULE EVERY DAY Patient taking differently: Take 20 mg by mouth daily.  05/16/17  Yes [name], MD  vitamin B-12 (CYANOCOBALAMIN) 1000 MCG tablet Take 1,000 mcg by mouth daily.   Yes [provider]  benzonatate (TESSALON) 200 MG capsule Take 1 capsule (200 mg total) by mouth 3 (three) times daily as needed for cough. Patient not taking: Reported on 04/22/2018 03/19/18   Jearld Fenton, NP  methocarbamol (ROBAXIN) 500 MG tablet TAKE 1 TABLET (500 MG TOTAL) BY MOUTH 3 (THREE) TIMES DAILY AS NEEDED FOR MUSCLE SPASMS. Patient not taking: Reported on 04/22/2018 11/24/17   [name], MD  triamcinolone cream (KENALOG) 0.1 % Apply 1 application topically 2 (two) times daily. Patient not taking: Reported on 04/22/2018 02/20/18   Jodelle Green, FNP    Family History Family History  Problem Relation Age of Onset  . Heart disease Mother 51  . Hyperlipidemia Mother   . Hyperlipidemia Father   . Stroke Father   . Heart disease Father   . Diabetes Father   . Fibromyalgia Sister   . Hyperlipidemia Sister   . Heart disease Brother   . Diabetes Brother     Social History Social History   Tobacco Use  . Smoking status: Never Smoker  . Smokeless tobacco: Never Used  Substance Use Topics  . Alcohol use: No    Alcohol/week: 0.0 standard drinks  . Drug use: No     Allergies   Amitriptyline; Oxybutynin; Tramadol; Niacin and related; and Septra  [sulfamethoxazole-trimethoprim]   Review of Systems Review of Systems  Constitutional: Negative for fever.  Neurological: Positive for headaches.  All other systems reviewed and are negative.    Physical Exam Updated Vital Signs BP (!) 142/69   Pulse 74   Temp 98.3 F (36.8 C) (Oral)   Resp (!) 22   Ht 5\' 3"  (1.6 m)   Wt 81.6 kg   SpO2 99%   BMI 31.89 kg/m   Physical Exam Vitals signs and nursing note reviewed.  Constitutional:      General: She is not in acute distress.    Appearance: She is well-developed.  HENT:     Head: Normocephalic and atraumatic.  Eyes:     General: No visual field deficit.    Conjunctiva/sclera: Conjunctivae normal.     Pupils: Pupils are equal, round, and reactive to light.  Neck:  Musculoskeletal: Normal range of motion and neck supple.  Cardiovascular:     Rate and Rhythm: Normal rate and regular rhythm.     Heart sounds: Normal heart sounds.  Pulmonary:     Effort: Pulmonary effort is normal. No respiratory distress.     Breath sounds: Normal breath sounds.  Abdominal:     General: There is no distension.     Palpations: Abdomen is soft.     Tenderness: There is no abdominal tenderness.  Musculoskeletal: Normal range of motion.        General: No deformity.  Skin:    General: Skin is warm and dry.  Neurological:     Mental Status: She is alert and oriented to person, place, and time. Mental status is at baseline.     GCS: GCS eye subscore is 4. GCS verbal subscore is 5. GCS motor subscore is 6.     Cranial Nerves: No cranial nerve deficit, dysarthria or facial asymmetry.      ED Treatments / Results  Labs (all labs ordered are listed, but only abnormal results are displayed) Labs Reviewed  BASIC METABOLIC PANEL - Abnormal; Notable for the following components:      Result Value   Glucose, Bld 130 (*)    All other components within normal limits  CBC WITH DIFFERENTIAL/PLATELET    EKG None  Radiology Ct Head Wo  Contrast  Result Date: 04/22/2018 CLINICAL DATA:  Headache since yesterday.  Nausea.  No vomiting. EXAM: CT HEAD WITHOUT CONTRAST TECHNIQUE: Contiguous axial images were obtained from the base of the skull through the vertex without intravenous contrast. COMPARISON:  May 19, 2016 FINDINGS: Brain: No subdural, epidural, or subarachnoid hemorrhage. Few small calcifications are seen in the cerebellum, unchanged. Cerebellum, brainstem, and basal cisterns are stable. Calcifications seen in the basal ganglia. There is a lacunar infarct in the left basal ganglia on series 6, image 13, more focal in the interval, consistent with interval evolution. Lacunar infarct in the left caudate head again identified. White matter changes are similar. No acute cortical ischemia infarct. No mass effect or midline shift. Vascular: No hyperdense vessel or unexpected calcification. Skull: Normal. Negative for fracture or focal lesion. Sinuses/Orbits: No acute finding. Other: None. IMPRESSION: Chronic white matter changes. No acute intracranial abnormalities are noted. Electronically Signed   By: Dorise Bullion III M.D   On: 04/22/2018 15:15    Procedures Procedures (including critical care time)  Medications Ordered in ED Medications  prochlorperazine (COMPAZINE) tablet 5 mg (has no administration in time range)  ondansetron (ZOFRAN) injection 4 mg (4 mg Intravenous Given 04/22/18 1329)  ketorolac (TORADOL) 15 MG/ML injection 15 mg (15 mg Intravenous Given 04/22/18 1331)  diphenhydrAMINE (BENADRYL) injection 12.5 mg (12.5 mg Intravenous Given 04/22/18 1333)     Initial Impression / Assessment and Plan / ED Course  I have reviewed the triage vital signs and the nursing notes.  Pertinent labs & imaging results that were available during my care of the patient were reviewed by me and considered in my medical decision making (see chart for details).     MDM  Screen complete  Patient is presenting for  evaluation of migraine.  Patient's symptoms are suggestive of her typical migraine.  Screening labs and imaging studies done in the ED are not suggestive of more significant acute pathology.  She does feel improved following administration of medications in the ED.  Patient understands the need for close follow-up.  Strict return precautions given and understood.  Final Clinical Impressions(s) / ED Diagnoses   Final diagnoses:  Migraine without status migrainosus, not intractable, unspecified migraine type    ED Discharge Orders    None       Valarie Merino, MD 04/22/18 [phone]

## 2018-04-22 NOTE — Discharge Instructions (Signed)
Please return for any problem.  Follow-up with your regular care provider as instructed. °

## 2018-04-22 NOTE — ED Triage Notes (Signed)
Pt endorses migraine and bilateral blurred vision starting yesterday morning. Pt went to [facility] and they gave her "a shot" which helped for a little, but then the pain came back and is still here today. Pt reports some bilateral hand numbness yesterday, but it is worse today.

## 2018-04-22 NOTE — ED Notes (Signed)
ED Provider at bedside. 

## 2018-04-22 NOTE — ED Notes (Signed)
Patient transported to CT 

## 2018-04-23 ENCOUNTER — Telehealth: Payer: Self-pay

## 2018-04-23 NOTE — Telephone Encounter (Signed)
Is this pain medicine for migraine? If so, could try one of her robaxin she has at home (muscle relaxant) Keep f/u appt tomorrow.

## 2018-04-23 NOTE — Telephone Encounter (Signed)
Left message for pt or her husband, [name] (on dpr), to call back.   Need to relay Dr. Synthia Innocent instructions and message.

## 2018-04-23 NOTE — Telephone Encounter (Signed)
[facility] faxed note that pt having migraine h/a on 04/21/18. Per chart review tab pt was seen 04/22/18 at [facility] ED and pt is to FU at [facility]. FYI to Dr Danise Mina.

## 2018-04-23 NOTE — Telephone Encounter (Signed)
pts husband contacted office requesting pain med for pt. Advised she may have to be seen, but that I would put the request in to Dr Darnell Level. Neurosurgery cannot see pt until Feb 2020; hospital f/u scheduled for 12/17

## 2018-04-23 NOTE — Telephone Encounter (Signed)
Pt's husband, [name], returned call.  Confirms pt's the pain med request is for migraine.  I relayed instructions for Robaxin and keeping f/u for tomorrow. He verbalizes understanding.

## 2018-04-24 ENCOUNTER — Ambulatory Visit (INDEPENDENT_AMBULATORY_CARE_PROVIDER_SITE_OTHER): Payer: Medicare HMO | Admitting: Family Medicine

## 2018-04-24 ENCOUNTER — Telehealth: Payer: Self-pay | Admitting: Neurology

## 2018-04-24 ENCOUNTER — Encounter: Payer: Self-pay | Admitting: Family Medicine

## 2018-04-24 VITALS — BP 120/84 | HR 70 | Temp 98.2°F | Ht 63.0 in | Wt 178.5 lb

## 2018-04-24 DIAGNOSIS — R413 Other amnesia: Secondary | ICD-10-CM | POA: Diagnosis not present

## 2018-04-24 DIAGNOSIS — G43109 Migraine with aura, not intractable, without status migrainosus: Secondary | ICD-10-CM | POA: Diagnosis not present

## 2018-04-24 DIAGNOSIS — R202 Paresthesia of skin: Secondary | ICD-10-CM

## 2018-04-24 DIAGNOSIS — R2681 Unsteadiness on feet: Secondary | ICD-10-CM

## 2018-04-24 DIAGNOSIS — E785 Hyperlipidemia, unspecified: Secondary | ICD-10-CM | POA: Diagnosis not present

## 2018-04-24 MED ORDER — IBUPROFEN 600 MG PO TABS: 600.0000 mg | ORAL_TABLET | Freq: Two times a day (BID) | ORAL | 0 refills | Status: DC | PRN

## 2018-04-24 MED ORDER — ATORVASTATIN CALCIUM 40 MG PO TABS: 40.0000 mg | ORAL_TABLET | Freq: Every day | ORAL | 3 refills | Status: DC

## 2018-04-24 NOTE — Telephone Encounter (Signed)
Spoke to patient - she has been worked into Dr. Rhea Belton schedule on 04/25/18 at noon.  She was instructed to arrive at 11:30am for check-in.

## 2018-04-24 NOTE — Telephone Encounter (Signed)
Please call Dr. Danise Mina with [facility] to discuss patient. He can be reached at [phone].

## 2018-04-24 NOTE — Patient Instructions (Addendum)
Likely complicated migraine. Return to see neurology - see our referral coordinator to schedule appointment.  May take ibuprofen 600mg  for migraines.  Let me know how you do.

## 2018-04-24 NOTE — Addendum Note (Signed)
Addended by: [name] on: 04/24/2018 01:47 PM   Modules accepted: Orders

## 2018-04-24 NOTE — Assessment & Plan Note (Signed)
Overdue for follow up. She has been on lovastatin for the past year. LDL at that time was >100. Will change to more potent atorvastatin 40mg  daily.

## 2018-04-24 NOTE — Assessment & Plan Note (Addendum)
Complex history, now with new auditory and visual hallucinations however no memory troubles prior to headaches pointing against a condition like lewy body dementia. Still anticipate most consistent with complicated migraine picture. She doesn't get migraines frequently enough to need prophylaxis (but would consider given severity when she does get it). I did recommend return to neurology for re-evaluation and further management recommendations. 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  For now, will Rx ibuprofen 600mg  to use PRN migraine abortively. Would consider fioricet abortively as well.  Next appt with Dr Krista Blue is 06/2018. I will touch base with her prior to ensure no other changes needed in the interim.

## 2018-04-24 NOTE — Progress Notes (Addendum)
BP 120/84 (BP Location: Left Arm, Patient Position: Sitting, Cuff Size: Normal)   Pulse 70   Temp 98.2 F (36.8 C) (Oral)   Ht 5\' 3"  (1.6 m)   Wt 178 lb 8 oz (81 kg)   SpO2 97%   BMI 31.62 kg/m    CC: ER f/u visit Subjective:    Patient ID: Dana [name], female    DOB: 27-Sep-1945, 72 y.o.   MRN: [id number]  HPI: Dana [name] is a 72 y.o. female presenting on 04/24/2018 for Hospitalization Follow-up (Seen at [facility] ED on 04/22/18. States she has hallucinations all night. Also, needs assistance from her husband to help with daily activities. Says she took Robaxin, per Dr. Darnell Level, and does not have a HA today. Pt accompanied by her husband, [name]. )   Recent ER visit 81/05/7508 for complicated headache associated with nausea, unsteadiness and bilateral hand numbness and confusion, treated with compazine, zofran, toradol, benadryl without much effect. Thought migraine related. Since home notices vision distortions (husband's face, my face in office today are distorted, fans at home "going crazy"). Notices swollen faces when she looks at people ie on TV. Ongoing hand paresthesia/numbness, trouble holding onto things, unsteadiness (husband has to help her get to bathroom). Treated ongoing migraine at home with robaxin muscle relaxant and excedrin. Also tried 2 of husband's tramadols which helped headache. Currently without headache, but vision changes and unsteadiness remains.   Denies fevers, URI sxs, vomiting, diarrhea. Denies memory trouble prior to HA.  Unsure trigger.   Head CT was unrevealing (chronic white matter changes, calcifications in cerebellum and basal ganglia, old lacunar infarct L basal ganglia and L caudate).   Excedrin didn't help much.  H/o migraines - this is different because she didn't have vision changes.   + auditory hallucinations, + visual hallucinations even prior to taking robaxin and tramadol.   She had hospitalization 06/5850 for complicated migraine associated with  paresthesias, memory impairment (could not remember family member names), fever and aphasia. LP was unrevealing, MRI brain was unrevealing as well (chronic microvascular changes, possible chronic microhemorrhage).   Relevant past medical, surgical, family and social history reviewed and updated as indicated. Interim medical history since our last visit reviewed. Allergies and medications reviewed and updated. Outpatient Medications Prior to Visit  Medication Sig Dispense Refill  . acetaminophen (TYLENOL) 325 MG tablet Take 650 mg by mouth every 6 (six) hours as needed for mild pain.     Marland Kitchen aspirin 81 MG tablet Take 81 mg by mouth daily.    Marland Kitchen aspirin-acetaminophen-caffeine (EXCEDRIN MIGRAINE) 250-250-65 MG tablet Take 1 tablet by mouth every 6 (six) hours as needed for headache or migraine. 30 tablet 0  . benzonatate (TESSALON) 200 MG capsule Take 1 capsule (200 mg total) by mouth 3 (three) times daily as needed for cough. 30 capsule 0  . co-enzyme Q-10 30 MG capsule Take 30 mg by mouth daily.     . fluticasone (FLONASE) 50 MCG/ACT nasal spray USE 2 SPRAYS IN EACH NOSTRIL EVERY DAY (Patient taking differently: Place 2 sprays into both nostrils daily. ) 48 g 3  . furosemide (LASIX) 20 MG tablet Take 1 tablet (20 mg total) by mouth daily as needed for fluid. 30 tablet 1  . methocarbamol (ROBAXIN) 500 MG tablet TAKE 1 TABLET (500 MG TOTAL) BY MOUTH 3 (THREE) TIMES DAILY AS NEEDED FOR MUSCLE SPASMS. 60 tablet 0  . Multiple Vitamin (MULTIVITAMIN) tablet Take 1 tablet by mouth daily.    Marland Kitchen  omeprazole (PRILOSEC) 20 MG capsule TAKE 1 CAPSULE EVERY DAY (Patient taking differently: Take 20 mg by mouth daily. ) 90 capsule 3  . triamcinolone cream (KENALOG) 0.1 % Apply 1 application topically 2 (two) times daily. 30 g 0  . vitamin B-12 (CYANOCOBALAMIN) 1000 MCG tablet Take 1,000 mcg by mouth daily.    Marland Kitchen lovastatin (MEVACOR) 40 MG tablet TAKE 1 TABLET AT BEDTIME (Patient taking differently: Take 40 mg by mouth  daily. ) 90 tablet 3   No facility-administered medications prior to visit.      Per HPI unless specifically indicated in ROS section below Review of Systems     Objective:    BP 120/84 (BP Location: Left Arm, Patient Position: Sitting, Cuff Size: Normal)   Pulse 70   Temp 98.2 F (36.8 C) (Oral)   Ht 5\' 3"  (1.6 m)   Wt 178 lb 8 oz (81 kg)   SpO2 97%   BMI 31.62 kg/m   Wt Readings from Last 3 Encounters:  04/24/18 178 lb 8 oz (81 kg)  04/22/18 180 lb (81.6 kg)  03/19/18 184 lb (83.5 kg)    Physical Exam Vitals signs and nursing note reviewed.  Constitutional:      General: She is not in acute distress.    Appearance: Normal appearance.  HENT:     Head: Normocephalic and atraumatic.     Nose: Nose normal.     Mouth/Throat:     Mouth: Mucous membranes are moist.     Pharynx: Oropharynx is clear.  Eyes:     Conjunctiva/sclera: Conjunctivae normal.     Pupils: Pupils are equal, round, and reactive to light.  Neck:     Musculoskeletal: Normal range of motion.     Vascular: No carotid bruit.  Cardiovascular:     Rate and Rhythm: Normal rate and regular rhythm.     Pulses: Normal pulses.     Heart sounds: Normal heart sounds. No murmur.  Pulmonary:     Effort: Pulmonary effort is normal. No respiratory distress.     Breath sounds: Normal breath sounds. No wheezing, rhonchi or rales.  Musculoskeletal: Normal range of motion.     Right lower leg: No edema.     Left lower leg: No edema.  Lymphadenopathy:     Cervical: No cervical adenopathy.  Skin:    General: Skin is warm and dry.     Capillary Refill: Capillary refill takes less than 2 seconds.     Coloration: Skin is not jaundiced or pale.  Neurological:     General: No focal deficit present.     Mental Status: She is alert.     Cranial Nerves: Cranial nerves are intact.     Sensory: Sensation is intact.     Motor: Motor function is intact.     Coordination: Romberg sign negative. Finger-Nose-Finger Test  normal.     Comments: Seems more unsure of herself than usual. Needs assistance getting on exam table. Somewhat unsteady gait.  No pronator drift. CN 2-12 intact FTN intact, EOMI  Psychiatric:        Attention and Perception: Attention normal. She perceives auditory and visual hallucinations.        Mood and Affect: Mood normal.        Speech: Speech normal.        Thought Content: Thought content normal.        Cognition and Memory: Cognition normal. She exhibits impaired recent memory.  Judgment: Judgment normal.    Results for orders placed or performed during the hospital encounter of 85/46/27  Basic metabolic panel  Result Value Ref Range   Sodium 141 135 - 145 mmol/L   Potassium 4.0 3.5 - 5.1 mmol/L   Chloride 104 98 - 111 mmol/L   CO2 23 22 - 32 mmol/L   Glucose, Bld 130 (H) 70 - 99 mg/dL   BUN 10 8 - 23 mg/dL   Creatinine, Ser 0.79 0.44 - 1.00 mg/dL   Calcium 9.4 8.9 - 10.3 mg/dL   GFR calc non Af Amer >60 >60 mL/min   GFR calc Af Amer >60 >60 mL/min   Anion gap 14 5 - 15  CBC with Differential  Result Value Ref Range   WBC 6.2 4.0 - 10.5 K/uL   RBC 4.54 3.87 - 5.11 MIL/uL   Hemoglobin 13.4 12.0 - 15.0 g/dL   HCT 41.9 36.0 - 46.0 %   MCV 92.3 80.0 - 100.0 fL   MCH 29.5 26.0 - 34.0 pg   MCHC 32.0 30.0 - 36.0 g/dL   RDW 13.4 11.5 - 15.5 %   Platelets 284 150 - 400 K/uL   nRBC 0.0 0.0 - 0.2 %   Neutrophils Relative % 66 %   Neutro Abs 4.0 1.7 - 7.7 K/uL   Lymphocytes Relative 26 %   Lymphs Abs 1.6 0.7 - 4.0 K/uL   Monocytes Relative 8 %   Monocytes Absolute 0.5 0.1 - 1.0 K/uL   Eosinophils Relative 0 %   Eosinophils Absolute 0.0 0.0 - 0.5 K/uL   Basophils Relative 0 %   Basophils Absolute 0.0 0.0 - 0.1 K/uL   Immature Granulocytes 0 %   Abs Immature Granulocytes 0.01 0.00 - 0.07 K/uL      Assessment & Plan:  Upcoming yearly trip back to [facility] 05/10/2018.   Problem List Items Addressed This Visit    HLD (hyperlipidemia)    Overdue for follow up. She has  been on lovastatin for the past year. LDL at that time was >100. Will change to more potent atorvastatin 40mg  daily.       Relevant Medications   atorvastatin (LIPITOR) 40 MG tablet   Complicated migraine - Primary    Complex history, now with new auditory and visual hallucinations however no memory troubles prior to headaches pointing against a condition like lewy body dementia. Still anticipate most consistent with complicated migraine picture. She doesn't get migraines frequently enough to need prophylaxis (but would consider given severity when she does get it). I did recommend return to neurology for re-evaluation and further management recommendations. 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  For now, will Rx ibuprofen 600mg  to use PRN migraine abortively. Would consider fioricet abortively as well.  Next appt with Dr Krista Blue is 06/2018. I will touch base with her prior to ensure no other changes needed in the interim.       Relevant Medications   ibuprofen (ADVIL,MOTRIN) 600 MG tablet   atorvastatin (LIPITOR) 40 MG tablet   Other Relevant Orders   Ambulatory referral to Neurology   Amnesia memory loss    Other Visit Diagnoses    Unsteadiness       Paresthesia of both hands           Meds ordered this encounter  Medications  .  ibuprofen (ADVIL,MOTRIN) 600 MG tablet    Sig: Take 1 tablet (600 mg total) by mouth 2 (two) times daily as needed for headache (migraines).    Dispense:  30 tablet    Refill:  0  . DISCONTD: atorvastatin (LIPITOR) 40 MG tablet    Sig: Take 1 tablet (40 mg total) by mouth daily.    Dispense:  30 tablet    Refill:  3  . atorvastatin (LIPITOR) 40 MG tablet    Sig: Take 1 tablet (40 mg total) by mouth daily.    Dispense:  90 tablet    Refill:  3   Orders Placed This Encounter  Procedures  . Ambulatory referral  to Neurology    Referral Priority:   Routine    Referral Type:   Consultation    Referral Reason:   Specialty Services Required    Requested Specialty:   Neurology    Number of Visits Requested:   1    Follow up plan: No follow-ups on file.  [name], MD

## 2018-04-24 NOTE — Telephone Encounter (Signed)
I was contacted by her PCP Dr. Danise Mina for recent ED presentation on Dec 15th 2019, she presented with headache, complains of visual distortion  Hx of chronic migraine with visual aura.  MRI brain in Jan 2019 showed atrophy, small vessel disease.  Please give her a follow up appt soon (1-2 weeks)

## 2018-04-25 ENCOUNTER — Ambulatory Visit: Payer: Medicare HMO | Admitting: Neurology

## 2018-04-25 ENCOUNTER — Encounter: Payer: Self-pay | Admitting: Neurology

## 2018-04-25 VITALS — BP 142/73 | HR 76 | Ht 63.0 in | Wt 177.5 lb

## 2018-04-25 DIAGNOSIS — IMO0002 Reserved for concepts with insufficient information to code with codable children: Secondary | ICD-10-CM

## 2018-04-25 DIAGNOSIS — G43709 Chronic migraine without aura, not intractable, without status migrainosus: Secondary | ICD-10-CM

## 2018-04-25 MED ORDER — BUTALBITAL-APAP-CAFFEINE 50-325-40 MG PO TABS: 1.0000 | ORAL_TABLET | Freq: Four times a day (QID) | ORAL | 5 refills | Status: DC | PRN

## 2018-04-25 MED ORDER — ONDANSETRON 4 MG PO TBDP: 4.0000 mg | ORAL_TABLET | Freq: Three times a day (TID) | ORAL | 6 refills | Status: DC | PRN

## 2018-04-25 MED ORDER — TOPIRAMATE 100 MG PO TABS: 100.0000 mg | ORAL_TABLET | Freq: Every day | ORAL | 11 refills | Status: DC

## 2018-04-25 NOTE — Patient Instructions (Addendum)
Daily topamax 100mg  every night as preventive medication.  When you do get headache,   You may take  Fioricet as needed Zofran as needed for nausea Robaxin for muscle relaxant  Aleve 1-2 tabs Benadryl 25mg 

## 2018-04-25 NOTE — Progress Notes (Signed)
PATIENT: Dana [name] DOB: August 19, 1945  Chief Complaint  Patient presents with  . Migraine    She is here with her husband, Dana [name].  States she has only had two significant migraines since May 2019.  Her most recent migraine caused the following concerning symptoms:  visual hallucinations, confusion, numbness in hands, difficulty walking, nausea,   She was treated in the ED for stroke work-up.  Her headache has now resolved.    Marland Kitchen PCP    Dana Bush, MD     [place] S Tippin is our 72 year old female accompanied by her husband, seen in request by her primary care physician Dana [name], Dana [name] for recurrent headaches.  I saw her previously in 2016 for chronic migraine headaches, last visit was on January 14, 2015.  She reported a history of migraine since 20s, her typical migraine started with visual aura, shadow coming down from one side of her visual field, lasting 5-10 minutes, then followed by a severe pounding headaches, piercing pain, movement would exacerbate her headaches, her headache usually lasts for one day.   At time of evaluation in 2016, she complains of increased frequency of headaches, in addition, she reported few episode of headache with associated confusion, language difficulty, longer lasting up to 24 hours,   She was taking Relafen as needed for headaches, which seems to work for her,  She lost to follow-up because overall her headache was under good control, only has moderate migraine headaches every 2 to 3 months, but began to experience more frequent and prolonged headaches since November 2019, in December, she suffered migraine lasting for 5 days, despite multiple home medication treatment, NSAIDs, Excedrin Migraine, Tylenol, without helping her symptoms, also failed to be relieved by emergency treatment, Toradol injection, during the intense headache, she also describes visual distortions, hallucinations, her husband face was distorted, the blade  of the ceiling fan began to move by itself, she was confused, unsteady gait  Previously she was taking amitriptyline complains of confusion slow thinking with the medications,  I personally reviewed MRI of the brain in January 2019, there was no acute abnormality, generalized atrophy, moderate supratentorium small vessel disease.  CT head in December 2019, there was no acute abnormality  Labs normal BMP, CBC, A1C 6.4   REVIEW OF SYSTEMS: Full 14 system review of systems performed and notable only for headache All other review of systems were negative.  ALLERGIES: Allergies  Allergen Reactions  . Amitriptyline Other (See Comments)    Worsened migraine and confusion  . Oxybutynin Other (See Comments)    Urinary retention/hesitancy  . Tramadol Other (See Comments)    Causes confusion and dizziness  . Niacin And Related Itching  . Septra [Sulfamethoxazole-Trimethoprim] Itching    HOME MEDICATIONS: Current Outpatient Medications  Medication Sig Dispense Refill  . acetaminophen (TYLENOL) 325 MG tablet Take 650 mg by mouth every 6 (six) hours as needed for mild pain.     Marland Kitchen aspirin 81 MG tablet Take 81 mg by mouth daily.    Marland Kitchen aspirin-acetaminophen-caffeine (EXCEDRIN MIGRAINE) 250-250-65 MG tablet Take 1 tablet by mouth every 6 (six) hours as needed for headache or migraine. 30 tablet 0  . atorvastatin (LIPITOR) 40 MG tablet Take 1 tablet (40 mg total) by mouth daily. 90 tablet 3  . benzonatate (TESSALON) 200 MG capsule Take 1 capsule (200 mg total) by mouth 3 (three) times daily as needed for cough. 30 capsule 0  . co-enzyme Q-10 30 MG capsule Take 30 mg  by mouth daily.     . fluticasone (FLONASE) 50 MCG/ACT nasal spray USE 2 SPRAYS IN EACH NOSTRIL EVERY DAY (Patient taking differently: Place 2 sprays into both nostrils daily. ) 48 g 3  . furosemide (LASIX) 20 MG tablet Take 1 tablet (20 mg total) by mouth daily as needed for fluid. 30 tablet 1  . ibuprofen (ADVIL,MOTRIN) 600 MG tablet  Take 1 tablet (600 mg total) by mouth 2 (two) times daily as needed for headache (migraines). 30 tablet 0  . methocarbamol (ROBAXIN) 500 MG tablet TAKE 1 TABLET (500 MG TOTAL) BY MOUTH 3 (THREE) TIMES DAILY AS NEEDED FOR MUSCLE SPASMS. 60 tablet 0  . Multiple Vitamin (MULTIVITAMIN) tablet Take 1 tablet by mouth daily.    Marland Kitchen omeprazole (PRILOSEC) 20 MG capsule TAKE 1 CAPSULE EVERY DAY (Patient taking differently: Take 20 mg by mouth daily. ) 90 capsule 3  . triamcinolone cream (KENALOG) 0.1 % Apply 1 application topically 2 (two) times daily. 30 g 0  . vitamin B-12 (CYANOCOBALAMIN) 1000 MCG tablet Take 1,000 mcg by mouth daily.     No current facility-administered medications for this visit.     PAST MEDICAL HISTORY: Past Medical History:  Diagnosis Date  . Allergy   . Anemia   . Arthritis   . Cataract   . Controlled type 2 diabetes mellitus without complication, without long-term current use of insulin ([place])   . Diverticulitis   . History of chicken pox   . Hyperlipidemia   . Kidney stone   . 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 Migraine   . Urge incontinence 04/29/2014    PAST SURGICAL HISTORY: Past Surgical History:  Procedure Laterality Date  . ABDOMINAL HYSTERECTOMY  2006   cervix remains, menorrhagia  . APPENDECTOMY  1970  . [place]   biopsy-left  . BREAST SURGERY  1995   right milk duct removed  . CARPAL TUNNEL RELEASE Right 02/2017  . CHOLECYSTECTOMY  1971  . COLONOSCOPY  11/2005   diverticulosis o/w WNL [facility])  . DEXA  2015   spine -0.4, hip -1.0 WNL  . LITHOTRIPSY    . LUMBAR EPIDURAL INJECTION Left 10/2017   transforaminal L L3/4 (Dr Dana [name])  . TUBAL LIGATION      FAMILY HISTORY: Family History  Problem Relation Age of Onset  . Heart disease Mother 31  . Hyperlipidemia Mother   . Hyperlipidemia Father   . Stroke Father   . Heart disease Father   . Diabetes Father   . Fibromyalgia Sister   . Hyperlipidemia Sister   . Heart disease  Brother   . Diabetes Brother     SOCIAL HISTORY: Social History   Socioeconomic History  . Marital status: Married    Spouse name: Dana [name]  . Number of children: 1  . Years of education: 32  . Highest education level: Not on file  Occupational History  . Occupation: Retired    Comment: Retired  Scientific laboratory technician  . Financial resource strain: Not on file  . Food insecurity:    Worry: Not on file    Inability: Not on file  . Transportation needs:    Medical: Not on file    Non-medical: Not on file  Tobacco Use  . Smoking status: Never Smoker  . Smokeless tobacco: Never Used  Substance and Sexual Activity  . Alcohol use: No    Alcohol/week: 0.0 standard drinks  . Drug use: No  . Sexual activity: Not on file  Lifestyle  . Physical activity:    Days per week: Not on file    Minutes per session: Not on file  . Stress: Not on file  Relationships  . Social connections:    Talks on phone: Not on file    Gets together: Not on file    Attends religious service: Not on file    Active member of club or organization: Not on file    Attends meetings of clubs or organizations: Not on file    Relationship status: Not on file  . Intimate partner violence:    Fear of current or ex partner: Not on file    Emotionally abused: Not on file    Physically abused: Not on file    Forced sexual activity: Not on file  Other Topics Concern  . Not on file  Social History Narrative   Patient is married and lives at home with her [name]). Patient is retired.    Edu: highschool education.   Right handed.   Exercise 3 times a week.   Diet: good water, fruits/vegetables daily   Caffeine: None      HAs living will, HCPOA, full code     PHYSICAL EXAM   Vitals:   04/25/18 1143  BP: (!) 142/73  Pulse: 76  Weight: 177 lb 8 oz (80.5 kg)  Height: 5\' 3"  (1.6 m)    Not recorded      Body mass index is 31.44 kg/m.  PHYSICAL EXAMNIATION:  Gen: NAD, conversant, well nourised,  obese, well groomed                     Cardiovascular: Regular rate rhythm, no peripheral edema, warm, nontender. Eyes: Conjunctivae clear without exudates or hemorrhage Neck: Supple, no carotid bruits. Pulmonary: Clear to auscultation bilaterally   NEUROLOGICAL EXAM:  MENTAL STATUS: Speech:    Speech is normal; fluent and spontaneous with normal comprehension.  Cognition:     Orientation to time, place and person     Normal recent and remote memory     Normal Attention span and concentration     Normal Language, naming, repeating,spontaneous speech     Fund of knowledge   CRANIAL NERVES: CN II: Visual fields are full to confrontation. Fundoscopic exam is normal with sharp discs and no vascular changes. Pupils are round equal and briskly reactive to light. CN III, IV, VI: extraocular movement are normal. No ptosis. CN V: Facial sensation is intact to pinprick in all 3 divisions bilaterally. Corneal responses are intact.  CN VII: Face is symmetric with normal eye closure and smile. CN VIII: Hearing is normal to rubbing fingers CN IX, X: Palate elevates symmetrically. Phonation is normal. CN XI: Head turning and shoulder shrug are intact CN XII: Tongue is midline with normal movements and no atrophy.  MOTOR: There is no pronator drift of out-stretched arms. Muscle bulk and tone are normal. Muscle strength is normal.  REFLEXES: Reflexes are 2+ and symmetric at the biceps, triceps, knees, and ankles. Plantar responses are flexor.  SENSORY: Intact to light touch, pinprick, positional sensation and vibratory sensation are intact in fingers and toes.  COORDINATION: Rapid alternating movements and fine finger movements are intact. There is no dysmetria on finger-to-nose and heel-knee-shin.    GAIT/STANCE: Posture is normal. Gait is steady with normal steps, base, arm swing, and turning. Heel and toe walking are normal. Tandem gait is normal.  Romberg is absent.   DIAGNOSTIC DATA  (LABS, IMAGING,  TESTING) - I reviewed patient records, labs, notes, testing and imaging myself where available.   ASSESSMENT AND PLAN  Nashalie S Durnin is a 72 y.o. 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 Ph.D.  [facility] [address] [place], [place] [address] Ph: [phone] Fax: [phone]  CC: Referring Provider

## 2018-04-26 ENCOUNTER — Other Ambulatory Visit: Payer: Self-pay | Admitting: Family Medicine

## 2018-04-26 ENCOUNTER — Other Ambulatory Visit: Payer: Self-pay | Admitting: *Deleted

## 2018-04-26 ENCOUNTER — Telehealth: Payer: Self-pay | Admitting: Neurology

## 2018-04-26 MED ORDER — TOPIRAMATE 100 MG PO TABS: 100.0000 mg | ORAL_TABLET | Freq: Every day | ORAL | 3 refills | Status: DC

## 2018-04-26 MED ORDER — ONDANSETRON 4 MG PO TBDP: 4.0000 mg | ORAL_TABLET | Freq: Three times a day (TID) | ORAL | 6 refills | Status: DC | PRN

## 2018-04-26 NOTE — Telephone Encounter (Signed)
Spoke to patient and reviewed the new medications (doses and directions).  She wrote the information down and read it back to me correctly.

## 2018-04-26 NOTE — Telephone Encounter (Signed)
Patient called on-call physician to clarify her prescription from her office visit on April 25, 2018,  Please call her again,  Topamax 100 mg every night with sending to was sent to [facility] mail order  [facility]  Aleve, benadryl does not need Rx, only use cocktail for severe prolonged headaches

## 2018-04-27 ENCOUNTER — Telehealth: Payer: Self-pay | Admitting: Family Medicine

## 2018-04-27 ENCOUNTER — Telehealth: Payer: Self-pay

## 2018-04-27 MED ORDER — TOPIRAMATE 100 MG PO TABS: 100.0000 mg | ORAL_TABLET | Freq: Every day | ORAL | 0 refills | Status: DC

## 2018-04-27 NOTE — Telephone Encounter (Signed)
Submitted PA for atorvastatin calcium 40 mg tab, key:  [id number], Rx #: [id number].  Received message stating "available without authorization".  Spoke with [employer] asking about PA request.  States they got an alert pt was initially taking lovastatin, which was filled in Nov and now received rx for atorvastatin.  I informed them there is a change in therapy as of last OV this week. Dr. Darnell Level is now prescribing atorvastatin 40 mg. They documented this info and will fill rx for pt.

## 2018-04-27 NOTE — Telephone Encounter (Signed)
[facility] called off to see if Dr.Gutierrez can send a 7 day supply of the Topiramate over to [place]. The pt stated she was out of medication and it will be an additional 7 days before the medication will arrive in the mail.

## 2018-04-27 NOTE — Telephone Encounter (Signed)
topamax sent locally.

## 2018-04-30 DIAGNOSIS — H5213 Myopia, bilateral: Secondary | ICD-10-CM | POA: Diagnosis not present

## 2018-04-30 DIAGNOSIS — H04123 Dry eye syndrome of bilateral lacrimal glands: Secondary | ICD-10-CM | POA: Diagnosis not present

## 2018-04-30 DIAGNOSIS — E119 Type 2 diabetes mellitus without complications: Secondary | ICD-10-CM | POA: Diagnosis not present

## 2018-04-30 DIAGNOSIS — Z1231 Encounter for screening mammogram for malignant neoplasm of breast: Secondary | ICD-10-CM | POA: Diagnosis not present

## 2018-04-30 DIAGNOSIS — H2513 Age-related nuclear cataract, bilateral: Secondary | ICD-10-CM | POA: Diagnosis not present

## 2018-04-30 DIAGNOSIS — H16223 Keratoconjunctivitis sicca, not specified as Sjogren's, bilateral: Secondary | ICD-10-CM | POA: Diagnosis not present

## 2018-04-30 DIAGNOSIS — H524 Presbyopia: Secondary | ICD-10-CM | POA: Diagnosis not present

## 2018-04-30 LAB — HM MAMMOGRAPHY

## 2018-04-30 LAB — HM DIABETES EYE EXAM

## 2018-05-01 ENCOUNTER — Encounter: Payer: Self-pay | Admitting: Family Medicine

## 2018-05-07 ENCOUNTER — Encounter: Payer: Self-pay | Admitting: Family Medicine

## 2018-05-07 NOTE — Progress Notes (Signed)
BP 122/64 (BP Location: Right Arm, Patient Position: Sitting, Cuff Size: Normal)   Pulse 69   Temp 97.8 F (36.6 C) (Oral)   Ht 5\' 3"  (1.6 m)   Wt 176 lb 12 oz (80.2 kg)   SpO2 97%   BMI 31.31 kg/m    CC: cough Subjective:    Patient ID: Dana [name], female    DOB: 12-24-1945, 72 y.o.   MRN: [id number]  HPI: Dana [name] is a 72 y.o. female presenting on 05/08/2018 for Cough (C/o productive cough with greyish mucous and runny nose. Started about 3 wks ago. Tried Dayquil and Nyquil, helpful. Pt accompanied by her husband, [name].)   See prior notes - appreciate neuro care Krista Blue) started on topamax for chronic migraine with benefit although side effects endorsed (memory trouble, paresthesias).   3 wk h/o productive cough, rhinorrhea, tickle in back of throat, PNDrainage and sinus congestion L>R. Initial chills, now resolved. Some sinus headache. Some dyspnea. Sinus > head congestion.   No fevers, ear or tooth pain, ST, or wheezing.   No h/o asthma. Second hand smoke exposure (husband).  Treating with dayquil, nyquil.  + sick contacts recently (flu - exposed after illness started however).      Relevant past medical, surgical, family and social history reviewed and updated as indicated. Interim medical history since our last visit reviewed. Allergies and medications reviewed and updated. Outpatient Medications Prior to Visit  Medication Sig Dispense Refill  . acetaminophen (TYLENOL) 325 MG tablet Take 650 mg by mouth every 6 (six) hours as needed for mild pain.     Marland Kitchen aspirin 81 MG tablet Take 81 mg by mouth daily.    Marland Kitchen aspirin-acetaminophen-caffeine (EXCEDRIN MIGRAINE) 250-250-65 MG tablet Take 1 tablet by mouth every 6 (six) hours as needed for headache or migraine. 30 tablet 0  . atorvastatin (LIPITOR) 40 MG tablet Take 1 tablet (40 mg total) by mouth daily. 90 tablet 3  . co-enzyme Q-10 30 MG capsule Take 30 mg by mouth daily.     . fluticasone (FLONASE) 50 MCG/ACT nasal  spray USE 2 SPRAYS IN EACH NOSTRIL EVERY DAY (Patient taking differently: Place 2 sprays into both nostrils daily. ) 48 g 3  . furosemide (LASIX) 20 MG tablet TAKE 1 TABLET BY MOUTH DAILY AS NEEDED FOR FLUID. 30 tablet 1  . ibuprofen (ADVIL,MOTRIN) 600 MG tablet Take 1 tablet (600 mg total) by mouth 2 (two) times daily as needed for headache (migraines). 30 tablet 0  . methocarbamol (ROBAXIN) 500 MG tablet TAKE 1 TABLET (500 MG TOTAL) BY MOUTH 3 (THREE) TIMES DAILY AS NEEDED FOR MUSCLE SPASMS. 60 tablet 0  . Multiple Vitamin (MULTIVITAMIN) tablet Take 1 tablet by mouth daily.    Marland Kitchen omeprazole (PRILOSEC) 20 MG capsule TAKE 1 CAPSULE EVERY DAY (Patient taking differently: Take 20 mg by mouth daily. ) 90 capsule 3  . ondansetron (ZOFRAN ODT) 4 MG disintegrating tablet Take 1 tablet (4 mg total) by mouth every 8 (eight) hours as needed. 20 tablet 6  . topiramate (TOPAMAX) 100 MG tablet Take 1 tablet (100 mg total) by mouth at bedtime. 90 tablet 3  . triamcinolone cream (KENALOG) 0.1 % Apply 1 application topically 2 (two) times daily. 30 g 0  . vitamin B-12 (CYANOCOBALAMIN) 1000 MCG tablet Take 1,000 mcg by mouth daily.    . butalbital-acetaminophen-caffeine (FIORICET, ESGIC) 50-325-40 MG tablet Take 1 tablet by mouth every 6 (six) hours as needed for headache. (Patient not taking: Reported  on 05/08/2018) 10 tablet 5  . benzonatate (TESSALON) 200 MG capsule Take 1 capsule (200 mg total) by mouth 3 (three) times daily as needed for cough. 30 capsule 0   No facility-administered medications prior to visit.      Per HPI unless specifically indicated in ROS section below Review of Systems Objective:    BP 122/64 (BP Location: Right Arm, Patient Position: Sitting, Cuff Size: Normal)   Pulse 69   Temp 97.8 F (36.6 C) (Oral)   Ht 5\' 3"  (1.6 m)   Wt 176 lb 12 oz (80.2 kg)   SpO2 97%   BMI 31.31 kg/m   Wt Readings from Last 3 Encounters:  05/08/18 176 lb 12 oz (80.2 kg)  04/25/18 177 lb 8 oz (80.5  kg)  04/24/18 178 lb 8 oz (81 kg)    Physical Exam Vitals signs and nursing note reviewed.  Constitutional:      General: She is not in acute distress.    Appearance: She is well-developed.  HENT:     Head: Normocephalic and atraumatic.     Right Ear: Hearing, tympanic membrane, ear canal and external ear normal.     Left Ear: Hearing, tympanic membrane, ear canal and external ear normal.     Nose: Mucosal edema (L>R) and rhinorrhea present.     Right Sinus: No maxillary sinus tenderness or frontal sinus tenderness.     Left Sinus: No maxillary sinus tenderness or frontal sinus tenderness.     Mouth/Throat:     Pharynx: Uvula midline. No oropharyngeal exudate or posterior oropharyngeal erythema.     Tonsils: No tonsillar abscesses.  Eyes:     General: No scleral icterus.    Conjunctiva/sclera: Conjunctivae normal.     Pupils: Pupils are equal, round, and reactive to light.  Neck:     Musculoskeletal: Normal range of motion and neck supple.  Cardiovascular:     Rate and Rhythm: Normal rate and regular rhythm.     Heart sounds: Normal heart sounds. No murmur.     Comments: Lungs clear Pulmonary:     Effort: Pulmonary effort is normal. No respiratory distress.     Breath sounds: Normal breath sounds. No wheezing or rales.  Lymphadenopathy:     Cervical: No cervical adenopathy.  Skin:    General: Skin is warm and dry.     Findings: No rash.       Results for orders placed or performed in visit on 05/07/18  HM DIABETES EYE EXAM  Result Value Ref Range   HM Diabetic Eye Exam No Retinopathy No Retinopathy   Assessment & Plan:   Problem List Items Addressed This Visit    Complicated migraine    Appreciate neuro care. Has been started on topamax with benefit.       Acute sinusitis - Primary    Will cover for bacterial cause given duration and progression of symptoms. Rx amoxicillin 7-10d course. Further supportive care as per instructions. Update if not improving with  treatment. Pt agrees with plan.       Relevant Medications   amoxicillin (AMOXIL) 875 MG tablet       Meds ordered this encounter  Medications  . amoxicillin (AMOXIL) 875 MG tablet    Sig: Take 1 tablet (875 mg total) by mouth 2 (two) times daily.    Dispense:  20 tablet    Refill:  0   No orders of the defined types were placed in this encounter.  Patient Instructions  You have a sinus infection. Take medicine as prescribed: amoxicillin twice daily for 10 days.  Push fluids and plenty of rest. Nasal saline irrigation or neti pot to help drain sinuses. May use plain mucinex with plenty of fluid to help mobilize mucous. Please let us know if fever >101.5, trouble opening/closing mouth, difficulty swallowing, or worsening instead of improving as expected.  Let us know if not improving with treatment.    Follow up plan: Return if symptoms worsen or fail to improve.  [name], MD

## 2018-05-08 ENCOUNTER — Ambulatory Visit: Payer: Medicare HMO

## 2018-05-08 ENCOUNTER — Ambulatory Visit (INDEPENDENT_AMBULATORY_CARE_PROVIDER_SITE_OTHER): Payer: Medicare HMO | Admitting: Family Medicine

## 2018-05-08 ENCOUNTER — Encounter: Payer: Self-pay | Admitting: Family Medicine

## 2018-05-08 VITALS — BP 122/64 | HR 69 | Temp 97.8°F | Ht 63.0 in | Wt 176.8 lb

## 2018-05-08 DIAGNOSIS — G43109 Migraine with aura, not intractable, without status migrainosus: Secondary | ICD-10-CM | POA: Diagnosis not present

## 2018-05-08 DIAGNOSIS — J019 Acute sinusitis, unspecified: Secondary | ICD-10-CM

## 2018-05-08 MED ORDER — AMOXICILLIN 875 MG PO TABS: 875.0000 mg | ORAL_TABLET | Freq: Two times a day (BID) | ORAL | 0 refills | Status: DC

## 2018-05-08 NOTE — Assessment & Plan Note (Signed)
Will cover for bacterial cause given duration and progression of symptoms. Rx amoxicillin 7-10d course. Further supportive care as per instructions. Update if not improving with treatment. Pt agrees with plan.

## 2018-05-08 NOTE — Patient Instructions (Addendum)
You have a sinus infection. Take medicine as prescribed: amoxicillin twice daily for 10 days.  Push fluids and plenty of rest. Nasal saline irrigation or neti pot to help drain sinuses. May use plain mucinex with plenty of fluid to help mobilize mucous. Please let us know if fever >101.5, trouble opening/closing mouth, difficulty swallowing, or worsening instead of improving as expected.  Let us know if not improving with treatment.

## 2018-05-08 NOTE — Assessment & Plan Note (Signed)
Appreciate neuro care. Has been started on topamax with benefit.

## 2018-05-11 ENCOUNTER — Ambulatory Visit: Payer: Medicare HMO

## 2018-05-14 ENCOUNTER — Encounter: Payer: Medicare HMO | Admitting: Family Medicine

## 2018-05-28 ENCOUNTER — Telehealth: Payer: Self-pay | Admitting: Family Medicine

## 2018-05-28 MED ORDER — ATORVASTATIN CALCIUM 40 MG PO TABS: 40.0000 mg | ORAL_TABLET | Freq: Every day | ORAL | 0 refills | Status: DC

## 2018-05-28 NOTE — Telephone Encounter (Signed)
Pt called office to request a refill on the atorvastatin. Pt is requesting it to be sent to [place], [place] [phone].

## 2018-05-28 NOTE — Telephone Encounter (Signed)
[facility] [address], [place], [place], per pt request.   Spoke with pt notifying her refill was sent to requested pharmacy in [facility].  Pt verbalizes understanding and expresses her thanks.

## 2018-05-28 NOTE — Addendum Note (Signed)
Addended by: Brenton Grills on: 01/05/5620 30:86 PM   Modules accepted: Orders

## 2018-05-31 ENCOUNTER — Telehealth: Payer: Self-pay

## 2018-05-31 NOTE — Telephone Encounter (Signed)
Submitted PA for Atorvastatin Calcium 40 mg tab, key:  [id number], PA case ID:  [id number], Rx #:  [id number].  Decision pending.

## 2018-06-01 NOTE — Telephone Encounter (Signed)
Received faxed PA approval valid until 05/09/2019.  

## 2018-06-08 ENCOUNTER — Other Ambulatory Visit: Payer: Self-pay

## 2018-06-08 NOTE — Telephone Encounter (Signed)
Ibprofen Last rx: 04/24/18, #30/0 Last OV:  05/08/18, acute Next OV:  09/26/18, CPE

## 2018-06-09 MED ORDER — IBUPROFEN 600 MG PO TABS: 600.0000 mg | ORAL_TABLET | Freq: Two times a day (BID) | ORAL | 0 refills | Status: DC | PRN

## 2018-06-18 ENCOUNTER — Institutional Professional Consult (permissible substitution): Payer: Commercial Managed Care - HMO | Admitting: Neurology

## 2018-06-25 ENCOUNTER — Other Ambulatory Visit: Payer: Self-pay | Admitting: Family Medicine

## 2018-06-25 NOTE — Telephone Encounter (Signed)
Sent. Thanks.   

## 2018-06-25 NOTE — Telephone Encounter (Signed)
Electronic refill request. Methocarbamol Last office visit:   05/08/18 Last Filled:    60 tablet 0 11/24/2017  Please advise.

## 2018-07-05 ENCOUNTER — Other Ambulatory Visit: Payer: Self-pay | Admitting: Family Medicine

## 2018-07-06 NOTE — Telephone Encounter (Signed)
Ibuprofen Last filled:  06/12/18, #30 Last OV:  05/08/18, acute Next OV:  09/26/18, CPE Pt 2

## 2018-08-10 IMAGING — MR MR HEAD W/O CM
9 of 10 series · 34 of 48 positions shown · non-contrast
Comparison: 05/19/2017 CT head

CLINICAL DATA: 71 y/o F; episode of confusion and disorientation
with aphasia. History of migraines.

EXAM:
MRI HEAD WITHOUT CONTRAST
TECHNIQUE: Multiplanar, multiecho pulse sequences of the brain and surrounding
structures were obtained without intravenous contrast.

[Series 3: DWI · axial · 3.0mm · 0.94mm/px · z∈[-88,+58]mm · 8 of 100 slices shown (1 of 2)]
[im 1/100]
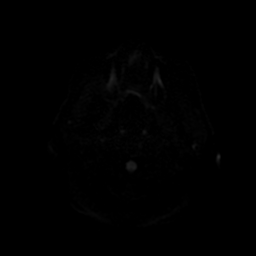
[im 12/100]
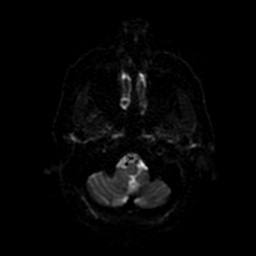
[im 34/100]
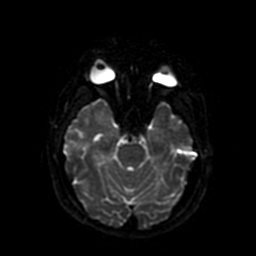
[im 45/100]
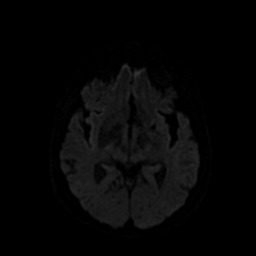
[im 56/100]
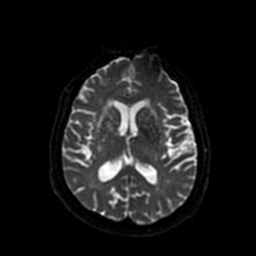
[im 67/100]
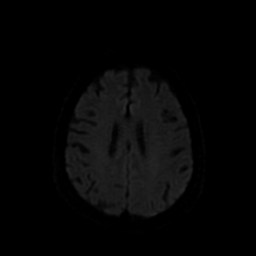
[im 89/100]
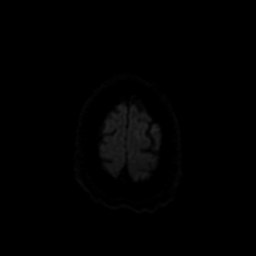
[im 100/100]
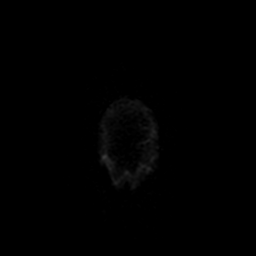

[Series 4: FLAIR · sagittal · 5.0mm · 0.47mm/px · 2 of 23 slices shown (1 of 2)]
[im 1/23]
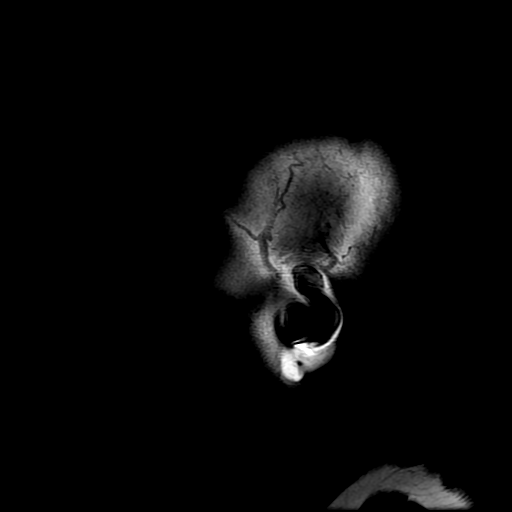
[im 23/23]
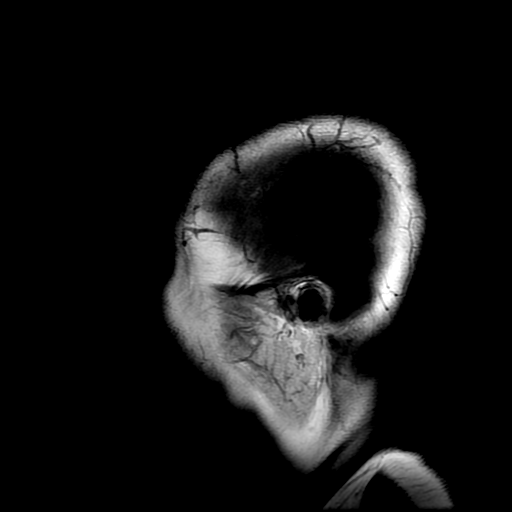

[Series 5: DWI · coronal · 4.0mm · 0.94mm/px · 6 of 68 slices shown (2 of 2)]
[im 1/68]
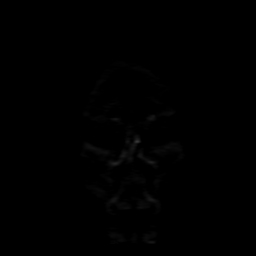
[im 14/68]
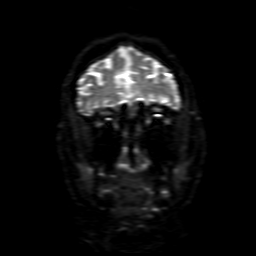
[im 27/68]
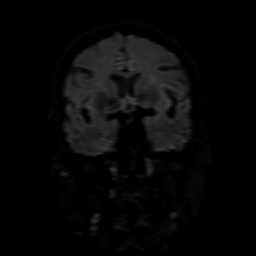
[im 41/68]
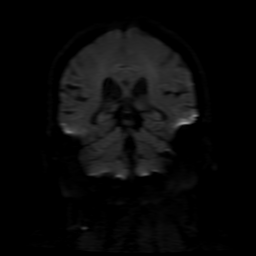
[im 54/68]
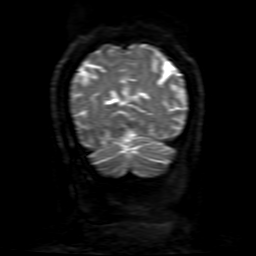
[im 68/68]
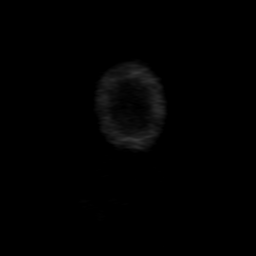

[Series 6: T2 · axial · 5.0mm · 0.47mm/px · z∈[-85,+65]mm · 2 of 26 slices shown (1 of 2)]
[im 1/26]
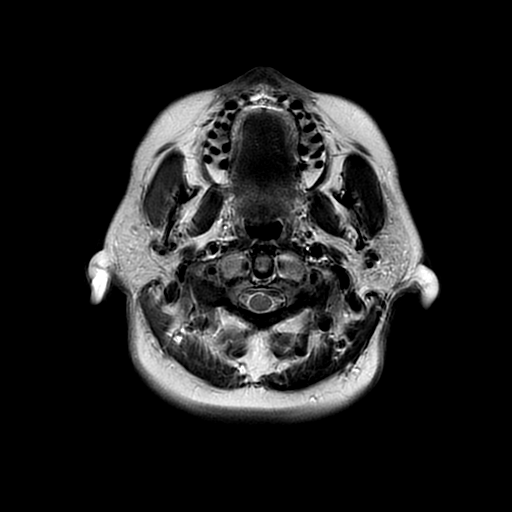
[im 26/26]
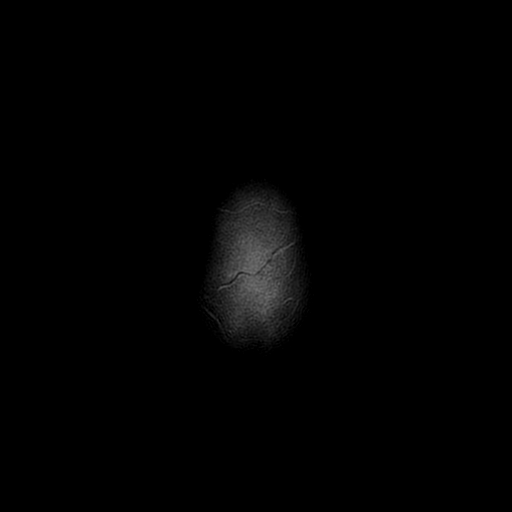

[Series 7: FLAIR · axial · 3.0mm · 0.47mm/px · z∈[-85,+65]mm · 2 of 26 slices shown (2 of 2)]
[im 1/26]
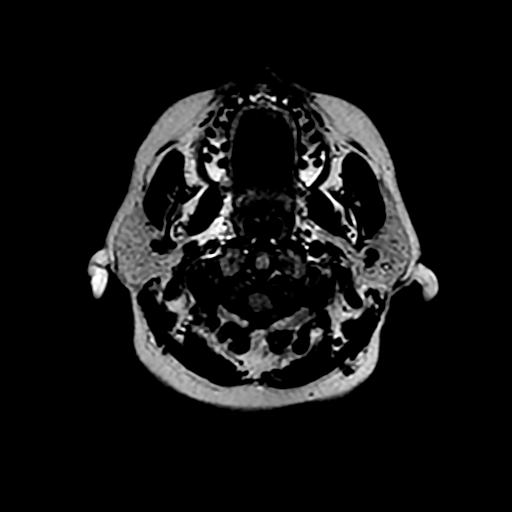
[im 26/26]
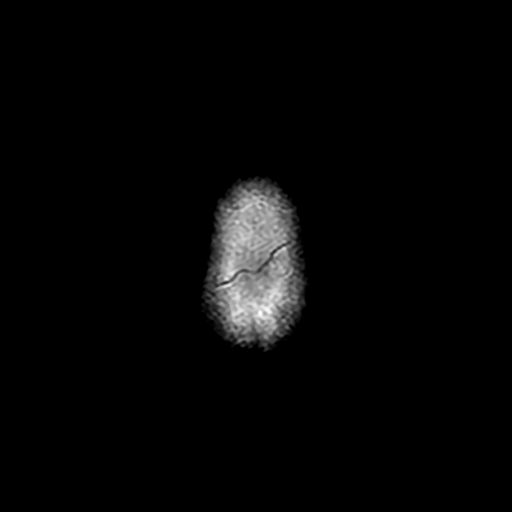

[Series 8: (person_name) · axial · 3.0mm · 0.47mm/px · z∈[-86,-35]mm · 3 of 104 slices shown]
[im 1/104]
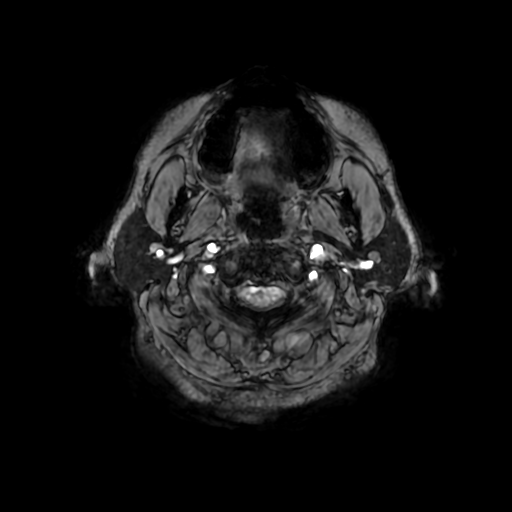
[im 12/104]
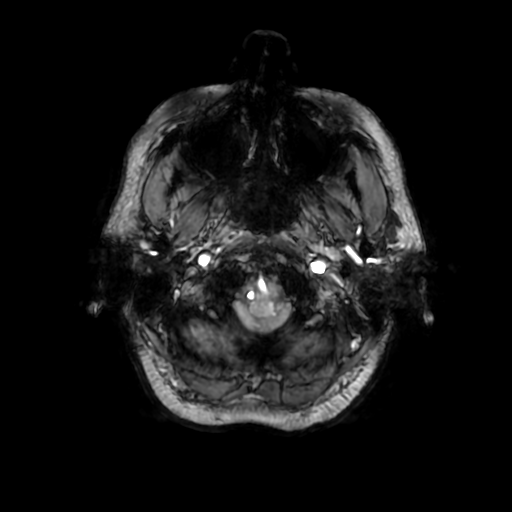
[im 35/104]
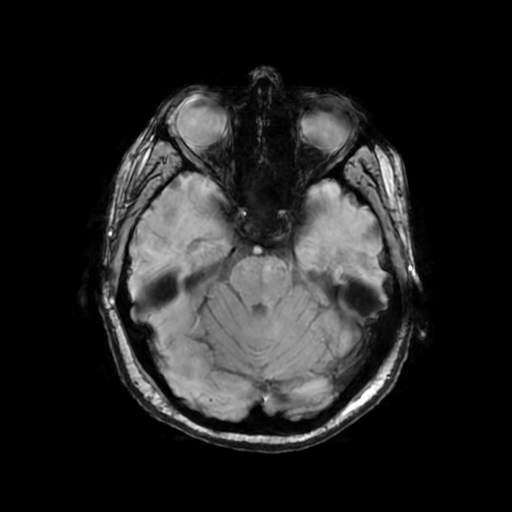

[Series 10: T2 · coronal · 5.0mm · 0.39mm/px · 3 of 28 slices shown (2 of 2)]
[im 1/28]
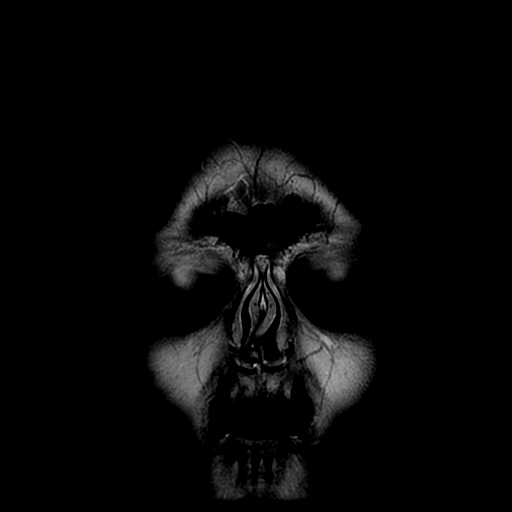
[im 14/28]
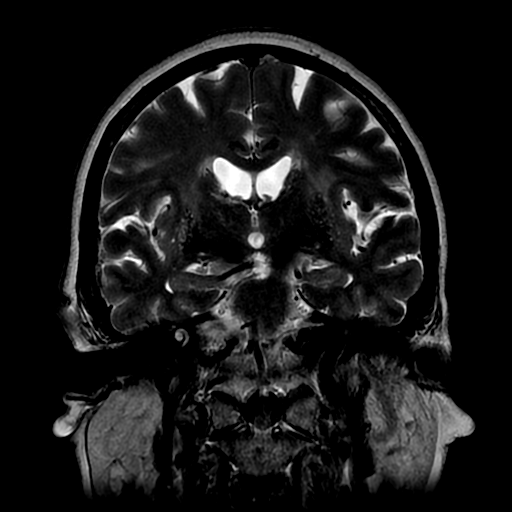
[im 28/28]
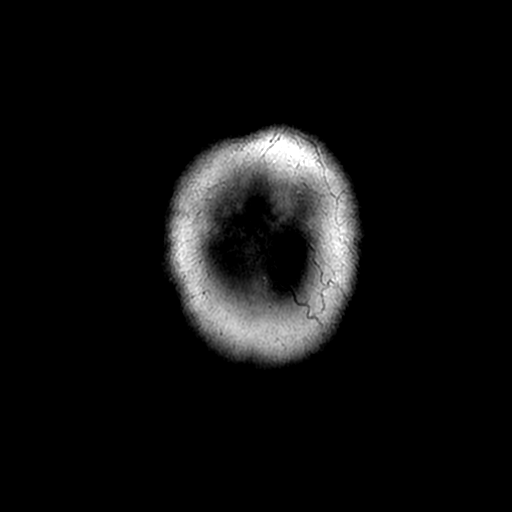

[Series 350: ADC · axial · 3.0mm · 0.94mm/px · z∈[-88,+58]mm · 5 of 50 slices shown (1 of 2)]
[im 1/50]
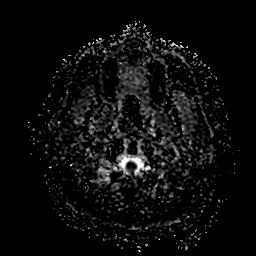
[im 13/50]
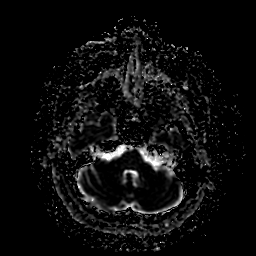
[im 25/50]
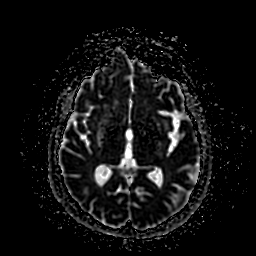
[im 37/50]
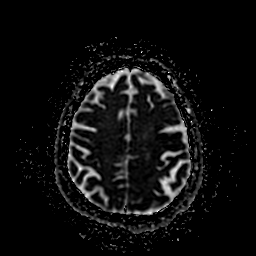
[im 50/50]
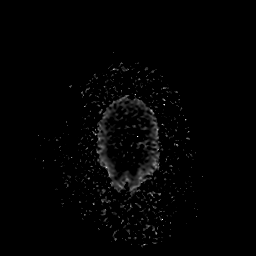

[Series 550: ADC · coronal · 4.0mm · 0.94mm/px · 3 of 34 slices shown (2 of 2)]
[im 1/34]
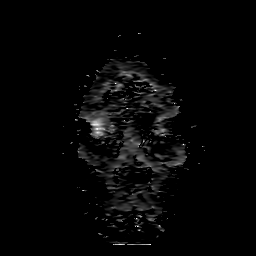
[im 17/34]
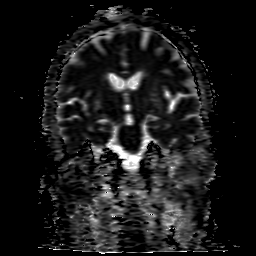
[im 34/34]
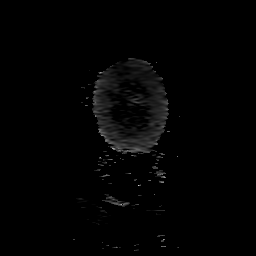

[34 of 48 positions shown; findings below may reference images not displayed]

FINDINGS: Brain: No acute infarction, hemorrhage, hydrocephalus, extra-axial
collection or mass lesion. Severalnonspecific foci of T2 FLAIR
hyperintense signal abnormality in subcortical and periventricular
white matter are compatible withmoderatechronic microvascular
ischemic changes for age. Mildbrain parenchymal volume loss. Few
punctate foci of susceptibility hypointensity are present within
left periventricular white matter compatible with hemosiderin
deposition of chronic microhemorrhage.

Vascular: Normal flow voids.

Skull and upper cervical spine: Normal marrow signal.

Sinuses/Orbits: Negative.

Other: None.
IMPRESSION: 1. No acute intracranial abnormality identified.
2. Moderate chronic microvascular ischemic changes and mild
parenchymal volume loss of the brain.

By: Memounati Zk M.D.

## 2018-08-29 ENCOUNTER — Ambulatory Visit (INDEPENDENT_AMBULATORY_CARE_PROVIDER_SITE_OTHER): Payer: Medicare HMO | Admitting: Neurology

## 2018-08-29 ENCOUNTER — Encounter: Payer: Self-pay | Admitting: Neurology

## 2018-08-29 ENCOUNTER — Other Ambulatory Visit: Payer: Self-pay

## 2018-08-29 DIAGNOSIS — G43709 Chronic migraine without aura, not intractable, without status migrainosus: Secondary | ICD-10-CM | POA: Diagnosis not present

## 2018-08-29 DIAGNOSIS — IMO0002 Reserved for concepts with insufficient information to code with codable children: Secondary | ICD-10-CM

## 2018-08-29 MED ORDER — TOPIRAMATE 25 MG PO TABS: 75.0000 mg | ORAL_TABLET | Freq: Every day | ORAL | 1 refills | Status: DC

## 2018-08-29 NOTE — Progress Notes (Signed)
Virtual Visit via Video Note  I connected with Dana [name] on 08/29/18 at  1:15 PM EDT by a telephone application and verified that I am speaking with the correct person using two identifiers.   I discussed the limitations of evaluation and management by telemedicine and the availability of in person appointments. The patient expressed understanding and agreed to proceed.  History of Present Illness: 04/25/18 YY: Dana [name] is our 73 year old female accompanied by her husband, seen in request by her primary care physician Dr.Gutierrez, Garlon Hatchet for recurrent headaches.  I saw her previously in 2016 for chronic migraine headaches, last visit was on January 14, 2015.  She reported a history of migraine since 20s, her typical migraine started with visual aura, shadow coming down from one side of her visual field, lasting 5-10 minutes, then followed by a severe pounding headaches, piercing pain, movement would exacerbate her headaches, her headache usually lasts for one day.   At time of evaluation in 2016, she complains of increased frequency of headaches, in addition, she reported few episode of headache with associated confusion, language difficulty, longer lasting up to 24 hours,   She was taking Relafen as needed for headaches, which seems to work for her,  She lost to follow-up because overall her headache was under good control, only has moderate migraine headaches every 2 to 3 months, but began to experience more frequent and prolonged headaches since November 2019, in December, she suffered migraine lasting for 5 days, despite multiple home medication treatment, NSAIDs, Excedrin Migraine, Tylenol, without helping her symptoms, also failed to be relieved by emergency treatment, Toradol injection, during the intense headache, she also describes visual distortions, hallucinations, her husband face was distorted, the blade of the ceiling fan began to move by itself, she was confused,  unsteady gait  Previously she was taking amitriptyline complains of confusion slow thinking with the medications,  I personally reviewed MRI of the brain in January 2019, there was no acute abnormality, generalized atrophy, moderate supratentorium small vessel disease.  CT head in December 2019, there was no acute abnormality  Labs normal BMP, CBC, A1C 6.4  August 29 2018 SS: Topamax 100 mg every night, Fioricet, Zofran, Robaxin, Aleve, Benadryl as needed for headache.  Taking daily aspirin 81 mg.   She reports she has been doing well, was not able to tolerate Topamax 100 mg, because she felt so sleepy.  She has been taking 50 mg at bedtime.  She reports she has not had any bad migraines, will have about 1 minor headache every 1 to 2 weeks.  She describes this headache as starting behind her eyes, feels like a sinus issue.  She does report this is exacerbated by bright light, sunlight.  She tries to wear her sunglasses at all times when outside.  One time she did have to take the Fioricet, she reports it was beneficial.  She had an eye exam recently, she did get new glasses, new lenses.  Before her bad migraines she will get shadows, that is her aura.  She does also complain of some neck pain with her headache. She has not had any episodes of confusion, numbness, trouble walking, or nausea.   Observations/Objective: Alert, answers questions, knowledgeable of health condition, speech is clear and concise  Assessment and Plan: 1.  Chronic migraine headache with aura 2.  Cerebral small vessel disease  We will adjust her Topamax to taking 75 mg at bedtime.  I sent in a prescription for the  25 mg tablets, taking 3 tablets at bedtime.  Medication has been beneficial for her headaches.  She does report has been having about 1 mild to moderate headache every 1 to 2 weeks.  She has been trying to avoid bright lights which is a trigger, wearing her sunglasses.  She will can continue to use Fioricet in  combination with Zofran, Robaxin, Aleve, Benadryl as needed for prolonged severe headaches.  She will continue to take a daily 81 mg aspirin.  She will call for any dose adjustments.  We discussed importance of staying hydrated, eating healthy, getting good rest, being active for headache prevention.   Follow Up Instructions: 6 months for revisit   I discussed the assessment and treatment plan with the patient. The patient was provided an opportunity to ask questions and all were answered. The patient agreed with the plan and demonstrated an understanding of the instructions.   The patient was advised to call back or seek an in-person evaluation if the symptoms worsen or if the condition fails to improve as anticipated.  I provided 30 minutes of non-face-to-face time during this encounter.   Evangeline Dakin, DNP  [facility] [address] [place], [place] [address] [phone]

## 2018-08-29 NOTE — Progress Notes (Signed)
I have reviewed and agreed above plan. 

## 2018-09-19 ENCOUNTER — Ambulatory Visit (INDEPENDENT_AMBULATORY_CARE_PROVIDER_SITE_OTHER): Payer: Medicare HMO

## 2018-09-19 ENCOUNTER — Other Ambulatory Visit: Payer: Self-pay | Admitting: Family Medicine

## 2018-09-19 DIAGNOSIS — Z0000 Encounter for general adult medical examination without abnormal findings: Secondary | ICD-10-CM | POA: Diagnosis not present

## 2018-09-19 DIAGNOSIS — G609 Hereditary and idiopathic neuropathy, unspecified: Secondary | ICD-10-CM

## 2018-09-19 DIAGNOSIS — E119 Type 2 diabetes mellitus without complications: Secondary | ICD-10-CM

## 2018-09-19 DIAGNOSIS — E785 Hyperlipidemia, unspecified: Secondary | ICD-10-CM

## 2018-09-19 NOTE — Progress Notes (Signed)
Subjective:   Dana [name] is a 73 y.o. female who presents for Medicare Annual (Subsequent) preventive examination.  Review of Systems:  N/A Cardiac Risk Factors include: advanced age (>55men, >80 women);dyslipidemia     Objective:     Vitals: There were no vitals taken for this visit.  There is no height or weight on file to calculate BMI.  Advanced Directives 09/19/2018 07/25/2017 05/19/2017 05/03/2017 08/17/2015 11/20/2014 11/13/2014  Does Patient Have a Medical Advance Directive? Yes Yes Yes Yes No No No  Type of Paramedic of [place];Living will Living will;Healthcare Power of Attorney Living will [place];Living will - - -  Does patient want to make changes to medical advance directive? No - Patient declined No - Patient declined No - Patient declined - - - -  Copy of [place] in Chart? Yes - validated most recent copy scanned in chart (See row information) - - Yes - - -  Would patient like information on creating a medical advance directive? - - - - Yes - Educational materials given - No - patient declined information    Tobacco Social History   Tobacco Use  Smoking Status Never Smoker  Smokeless Tobacco Never Used     Counseling given: No   Clinical Intake:  Pre-visit preparation completed: Yes  Pain : No/denies pain Pain Score: 0-No pain     Nutritional Status: BMI 25 -29 Overweight Nutritional Risks: None Diabetes: No  How often do you need to have someone help you when you read instructions, pamphlets, or other written materials from your doctor or pharmacy?: 1 - Never What is the last grade level you completed in school?: 12th grade  Interpreter Needed?: No  Comments: pt lives with spouse Information entered by :: LPinson, LPN  Past Medical History:  Diagnosis Date  . Allergy   . Anemia   . Arthritis   . Cataract   . Controlled type 2 diabetes mellitus without complication, without  long-term current use of insulin ([place])   . Diverticulitis   . History of chicken pox   . Hyperlipidemia   . Kidney stone   . 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 Migraine   . Urge incontinence 04/29/2014   Past Surgical History:  Procedure Laterality Date  . ABDOMINAL HYSTERECTOMY  2006   cervix remains, menorrhagia  . APPENDECTOMY  1970  . [place]   biopsy-left  . BREAST SURGERY  1995   right milk duct removed  . CARPAL TUNNEL RELEASE Right 02/2017  . CHOLECYSTECTOMY  1971  . COLONOSCOPY  11/2005   diverticulosis o/w WNL [facility])  . DEXA  2015   spine -0.4, hip -1.0 WNL  . LITHOTRIPSY    . LUMBAR EPIDURAL INJECTION Left 10/2017   transforaminal L L3/4 (Dr Maryjean Ka)  . TUBAL LIGATION     Family History  Problem Relation Age of Onset  . Heart disease Mother 48  . Hyperlipidemia Mother   . Hyperlipidemia Father   . Stroke Father   . Heart disease Father   . Diabetes Father   . Fibromyalgia Sister   . Hyperlipidemia Sister   . Heart disease Brother   . Diabetes Brother    Social History   Socioeconomic History  . Marital status: Married    Spouse name: [name]  . Number of children: 1  . Years of education: 37  . Highest education level: Not on file  Occupational History  .  Occupation: Retired    Comment: Retired  Scientific laboratory technician  . Financial resource strain: Not on file  . Food insecurity:    Worry: Not on file    Inability: Not on file  . Transportation needs:    Medical: Not on file    Non-medical: Not on file  Tobacco Use  . Smoking status: Never Smoker  . Smokeless tobacco: Never Used  Substance and Sexual Activity  . Alcohol use: No    Alcohol/week: 0.0 standard drinks  . Drug use: No  . Sexual activity: Not Currently  Lifestyle  . Physical activity:    Days per week: Not on file    Minutes per session: Not on file  . Stress: Not on file  Relationships  . Social connections:    Talks on phone: Not on file    Gets together: Not on  file    Attends religious service: Not on file    Active member of club or organization: Not on file    Attends meetings of clubs or organizations: Not on file    Relationship status: Not on file  Other Topics Concern  . Not on file  Social History Narrative   Patient is married and lives at home with her husband [name]). Patient is retired.    Edu: highschool education.   Right handed.   Exercise 3 times a week.   Diet: good water, fruits/vegetables daily   Caffeine: None      HAs living will, HCPOA, full code    Outpatient Encounter Medications as of 09/19/2018  Medication Sig  . acetaminophen (TYLENOL) 325 MG tablet Take 650 mg by mouth every 6 (six) hours as needed for mild pain.   Marland Kitchen aspirin 81 MG tablet Take 81 mg by mouth daily.  Marland Kitchen atorvastatin (LIPITOR) 40 MG tablet Take 1 tablet (40 mg total) by mouth daily.  . butalbital-acetaminophen-caffeine (FIORICET, ESGIC) 50-325-40 MG tablet Take 1 tablet by mouth every 6 (six) hours as needed for headache.  . co-enzyme Q-10 30 MG capsule Take 30 mg by mouth daily.   . fluticasone (FLONASE) 50 MCG/ACT nasal spray USE 2 SPRAYS IN EACH NOSTRIL EVERY DAY  . furosemide (LASIX) 20 MG tablet TAKE 1 TABLET BY MOUTH DAILY AS NEEDED FOR FLUID.  . IBU 600 MG tablet TAKE 1 TABLET (600 MG TOTAL) BY MOUTH 2 (TWO) TIMES DAILY AS NEEDED FOR HEADACHE (MIGRAINES).  . methocarbamol (ROBAXIN) 500 MG tablet TAKE 1 TABLET (500 MG TOTAL) BY MOUTH 3 (THREE) TIMES DAILY AS NEEDED FOR MUSCLE SPASMS.  . Multiple Vitamin (MULTIVITAMIN) tablet Take 1 tablet by mouth daily.  Marland Kitchen omeprazole (PRILOSEC) 20 MG capsule TAKE 1 CAPSULE EVERY DAY  . ondansetron (ZOFRAN ODT) 4 MG disintegrating tablet Take 1 tablet (4 mg total) by mouth every 8 (eight) hours as needed.  . topiramate (TOPAMAX) 25 MG tablet Take 3 tablets (75 mg total) by mouth at bedtime. Take 3 tablets at bedtime  . triamcinolone cream (KENALOG) 0.1 % Apply 1 application topically 2 (two) times daily.  .  vitamin B-12 (CYANOCOBALAMIN) 1000 MCG tablet Take 1,000 mcg by mouth daily.  . [DISCONTINUED] aspirin-acetaminophen-caffeine (EXCEDRIN MIGRAINE) 250-250-65 MG tablet Take 1 tablet by mouth every 6 (six) hours as needed for headache or migraine.  . [DISCONTINUED] amoxicillin (AMOXIL) 875 MG tablet Take 1 tablet (875 mg total) by mouth 2 (two) times daily. (Patient not taking: Reported on 08/29/2018)   No facility-administered encounter medications on file as of 09/19/2018.  Activities of Daily Living In your present state of health, do you have any difficulty performing the following activities: 09/19/2018  Hearing? N  Vision? N  Difficulty concentrating or making decisions? Y  Walking or climbing stairs? N  Dressing or bathing? N  Doing errands, shopping? N  Preparing Food and eating ? N  Using the Toilet? N  In the past six months, have you accidently leaked urine? N  Do you have problems with loss of bowel control? N  Managing your Medications? N  Managing your Finances? N  Housekeeping or managing your Housekeeping? N  Some recent data might be hidden    Patient Care Team: [name], MD as PCP - General (Family Medicine)    Assessment:   This is a routine wellness examination for [place].  Vision Screening Comments: Vision exam in Dec 2019 with Dr. Maryruth Hancock B.   Exercise Activities and Dietary recommendations Current Exercise Habits: The patient does not participate in regular exercise at present, Exercise limited by: None identified  Goals    . Patient Stated     Starting 09/19/2018, I will continue to take medications as prescribed.        Fall Risk Fall Risk  09/19/2018 07/25/2017 05/03/2017 05/03/2016 05/01/2015  Falls in the past year? 0 No No No No   Depression Screen PHQ 2/9 Scores 09/19/2018 09/08/2017 07/25/2017 05/03/2017  PHQ - 2 Score 0 0 0 1  PHQ- 9 Score 0 0 - 6     Cognitive Function MMSE - Mini Mental State Exam 09/19/2018 05/03/2017  Orientation  to time 5 5  Orientation to Place 5 5  Registration 3 3  Attention/ Calculation 0 0  Recall 3 3  Language- name 2 objects 0 0  Language- repeat 1 1  Language- follow 3 step command 0 3  Language- read & follow direction 0 0  Write a sentence 0 0  Copy design 0 0  Total score 17 20     PLEASE NOTE: A Mini-Cog screen was completed. Maximum score is 17. A value of 0 denotes this part of [place] MMSE was not completed or the patient failed this part of the Mini-Cog screening.   Mini-Cog Screening Orientation to Time - Max 5 pts Orientation to Place - Max 5 pts Registration - Max 3 pts Recall - Max 3 pts Language Repeat - Max 1 pts      Immunization History  Administered Date(s) Administered  . Influenza, High Dose Seasonal PF 01/28/2015  . Influenza,inj,Quad PF,6+ Mos 01/09/2013, 01/22/2016, 02/01/2017, 02/15/2018  . Influenza-Unspecified 02/18/2014, 02/07/2016, 02/06/2017  . Pneumococcal Conjugate-13 04/29/2014  . Pneumococcal Polysaccharide-23 05/03/2016  . Pneumococcal-Unspecified 05/09/2010  . Zoster 05/09/2008  . Zoster Recombinat (Shingrix) 02/09/2018    Screening Tests Health Maintenance  Topic Date Due  . DTaP/Tdap/Td (1 - Tdap) 01/24/1966  . TETANUS/TDAP  01/24/1966  . HEMOGLOBIN A1C  05/09/2018  . URINE MICROALBUMIN  11/07/2018  . FOOT EXAM  11/07/2018  . INFLUENZA VACCINE  12/08/2018  . Fecal DNA (Cologuard)  03/31/2019  . MAMMOGRAM  05/01/2019  . OPHTHALMOLOGY EXAM  05/01/2019  . DEXA SCAN  Completed  . Hepatitis C Screening  Completed  . PNA vac Low Risk Adult  Completed      Plan:     I have personally reviewed, addressed, and noted the following in the patient's chart:  A. Medical and social history B. Use of alcohol, tobacco or illicit drugs  C. Current medications and supplements D. Functional ability  and status E.  Nutritional status F.  Physical activity G. Advance directives H. List of other physicians I.  Hospitalizations, surgeries,  and ER visits in previous 12 months J.  Vitals (unless it is a telemedicine encounter) K. Screenings to include cognitive, depression, hearing, vision (NOTE: hearing and vision screenings not completed in telemedicine encounter) L. Referrals and appointments   In addition, I have reviewed and discussed with patient certain preventive protocols, quality metrics, and best practice recommendations. A written personalized care plan for preventive services and recommendations were provided to patient.  With patient's permission, we connected on 09/19/18 at 12:30 PM EDT by a video enabled telemedicine application. Two patient identifiers were used to ensure the encounter occurred with the correct person.    Patient was in home and writer was in office.   Signed,   Lindell Noe, MHA, BS, LPN Health Coach

## 2018-09-19 NOTE — Progress Notes (Signed)
PCP notes:   Health maintenance:  Tetanus vaccine - postponed/insurance A1C - ordered Microalbumin - ordered  Abnormal screenings:   None  Patient concerns:   None   Nurse concerns:  None  Next PCP appt:   09/26/18 @ 1600

## 2018-09-19 NOTE — Patient Instructions (Signed)
Dana [name] , Thank you for taking time to come for your Medicare Wellness Visit. I appreciate your ongoing commitment to your health goals. Please review the following plan we discussed and let me know if I can assist you in the future.   These are the goals we discussed: Goals    . Patient Stated     Starting 09/19/2018, I will continue to take medications as prescribed.        This is a list of the screening recommended for you and due dates:  Health Maintenance  Topic Date Due  . Hemoglobin A1C  05/09/2018  . Urine Protein Check  11/07/2018  . DTaP/Tdap/Td vaccine (1 - Tdap) 05/08/2020*  . Tetanus Vaccine  05/08/2020*  . Complete foot exam   11/07/2018  . Flu Shot  12/08/2018  . Cologuard (Stool DNA test)  03/31/2019  . Mammogram  05/01/2019  . Eye exam for diabetics  05/01/2019  . DEXA scan (bone density measurement)  Completed  .  Hepatitis C: One time screening is recommended by Center for Disease Control  (CDC) for  adults born from [birth year] through 1964.   Completed  . Pneumonia vaccines  Completed  *Topic was postponed. The date shown is not the original due date.   Preventive Care for Adults  A healthy lifestyle and preventive care can promote health and wellness. Preventive health guidelines for adults include the following key practices.  . A routine yearly physical is a good way to check with your health care provider about your health and preventive screening. It is a chance to share any concerns and updates on your health and to receive a thorough exam.  . Visit your dentist for a routine exam and preventive care every 6 months. Brush your teeth twice a day and floss once a day. Good oral hygiene prevents tooth decay and gum disease.  . The frequency of eye exams is based on your age, health, family medical history, use  of contact lenses, and other factors. Follow your health care provider's recommendations for frequency of eye exams.  . Eat a healthy diet. Foods like  vegetables, fruits, whole grains, low-fat dairy products, and lean protein foods contain the nutrients you need without too many calories. Decrease your intake of foods high in solid fats, added sugars, and salt. Eat the right amount of calories for you. Get information about a proper diet from your health care provider, if necessary.  . Regular physical exercise is one of the most important things you can do for your health. Most adults should get at least 150 minutes of moderate-intensity exercise (any activity that increases your heart rate and causes you to sweat) each week. In addition, most adults need muscle-strengthening exercises on 2 or more days a week.  Silver Sneakers may be a benefit available to you. To determine eligibility, you may visit the website: www.silversneakers.com or contact program at 9807504219 Mon-Fri between 8AM-8PM.   . Maintain a healthy weight. The body mass index (BMI) is a screening tool to identify possible weight problems. It provides an estimate of body fat based on height and weight. Your health care provider can find your BMI and can help you achieve or maintain a healthy weight.   For adults 20 years and older: ? A BMI below 18.5 is considered underweight. ? A BMI of 18.5 to 24.9 is normal. ? A BMI of 25 to 29.9 is considered overweight. ? A BMI of 30 and above is considered obese.   Marland Kitchen  Maintain normal blood lipids and cholesterol levels by exercising and minimizing your intake of saturated fat. Eat a balanced diet with plenty of fruit and vegetables. Blood tests for lipids and cholesterol should begin at age 10 and be repeated every 5 years. If your lipid or cholesterol levels are high, you are over 50, or you are at high risk for heart disease, you may need your cholesterol levels checked more frequently. Ongoing high lipid and cholesterol levels should be treated with medicines if diet and exercise are not working.  . If you smoke, find out from your  health care provider how to quit. If you do not use tobacco, please do not start.  . If you choose to drink alcohol, please do not consume more than 2 drinks per day. One drink is considered to be 12 ounces (355 mL) of beer, 5 ounces (148 mL) of wine, or 1.5 ounces (44 mL) of liquor.  . If you are 62-62 years old, ask your health care provider if you should take aspirin to prevent strokes.  . Use sunscreen. Apply sunscreen liberally and repeatedly throughout the day. You should seek shade when your shadow is shorter than you. Protect yourself by wearing long sleeves, pants, a wide-brimmed hat, and sunglasses year round, whenever you are outdoors.  . Once a month, do a whole body skin exam, using a mirror to look at the skin on your back. Tell your health care provider of new moles, moles that have irregular borders, moles that are larger than a pencil eraser, or moles that have changed in shape or color.

## 2018-09-20 ENCOUNTER — Other Ambulatory Visit (INDEPENDENT_AMBULATORY_CARE_PROVIDER_SITE_OTHER): Payer: Medicare HMO

## 2018-09-20 DIAGNOSIS — E785 Hyperlipidemia, unspecified: Secondary | ICD-10-CM | POA: Diagnosis not present

## 2018-09-20 DIAGNOSIS — G609 Hereditary and idiopathic neuropathy, unspecified: Secondary | ICD-10-CM | POA: Diagnosis not present

## 2018-09-20 DIAGNOSIS — E119 Type 2 diabetes mellitus without complications: Secondary | ICD-10-CM | POA: Diagnosis not present

## 2018-09-20 LAB — COMPREHENSIVE METABOLIC PANEL
ALT: 20 U/L (ref 0–35)
AST: 17 U/L (ref 0–37)
Albumin: 4.2 g/dL (ref 3.5–5.2)
Alkaline Phosphatase: 77 U/L (ref 39–117)
BUN: 16 mg/dL (ref 6–23)
CO2: 25 mEq/L (ref 19–32)
Calcium: 9.3 mg/dL (ref 8.4–10.5)
Chloride: 109 mEq/L (ref 96–112)
Creatinine, Ser: 0.72 mg/dL (ref 0.40–1.20)
GFR: 79.7 mL/min (ref >60.00)
Glucose, Bld: 106 mg/dL — ABNORMAL HIGH (ref 70–99)
Potassium: 4 mEq/L (ref 3.5–5.1)
Sodium: 142 mEq/L (ref 135–145)
Total Bilirubin: 0.6 mg/dL (ref 0.2–1.2)
Total Protein: 7.2 g/dL (ref 6.0–8.3)

## 2018-09-20 LAB — HEMOGLOBIN A1C: Hgb A1c MFr Bld: 6.7 % — ABNORMAL HIGH (ref 4.6–6.5)

## 2018-09-20 LAB — TSH: TSH: 1.97 u[IU]/mL (ref 0.35–4.50)

## 2018-09-20 LAB — LIPID PANEL
Cholesterol: 141 mg/dL (ref 0–200)
HDL: 43 mg/dL (ref >39.00)
LDL Cholesterol: 78 mg/dL (ref 0–99)
NonHDL: 98.06
Total CHOL/HDL Ratio: 3
Triglycerides: 102 mg/dL (ref 0.0–149.0)
VLDL: 20.4 mg/dL (ref 0.0–40.0)

## 2018-09-20 LAB — MICROALBUMIN / CREATININE URINE RATIO
Creatinine,U: 82.7 mg/dL
Microalb Creat Ratio: 6 mg/g (ref 0.0–30.0)
Microalb, Ur: 5 mg/dL — ABNORMAL HIGH (ref 0.0–1.9)

## 2018-09-20 LAB — VITAMIN B12: Vitamin B-12: >1500 pg/mL — ABNORMAL HIGH (ref 211–911)

## 2018-09-26 ENCOUNTER — Encounter: Payer: Self-pay | Admitting: Family Medicine

## 2018-09-26 ENCOUNTER — Ambulatory Visit (INDEPENDENT_AMBULATORY_CARE_PROVIDER_SITE_OTHER): Payer: Medicare HMO | Admitting: Family Medicine

## 2018-09-26 VITALS — BP 111/74 | HR 62 | Temp 97.5°F | Ht 63.0 in | Wt 165.1 lb

## 2018-09-26 DIAGNOSIS — E669 Obesity, unspecified: Secondary | ICD-10-CM

## 2018-09-26 DIAGNOSIS — R87622 Low grade squamous intraepithelial lesion on cytologic smear of vagina (LGSIL): Secondary | ICD-10-CM

## 2018-09-26 DIAGNOSIS — E785 Hyperlipidemia, unspecified: Secondary | ICD-10-CM | POA: Diagnosis not present

## 2018-09-26 DIAGNOSIS — E119 Type 2 diabetes mellitus without complications: Secondary | ICD-10-CM | POA: Diagnosis not present

## 2018-09-26 DIAGNOSIS — G43109 Migraine with aura, not intractable, without status migrainosus: Secondary | ICD-10-CM | POA: Diagnosis not present

## 2018-09-26 MED ORDER — ATORVASTATIN CALCIUM 40 MG PO TABS: 40.0000 mg | ORAL_TABLET | Freq: Every day | ORAL | 3 refills | Status: DC

## 2018-09-26 MED ORDER — IBUPROFEN 600 MG PO TABS: 600.0000 mg | ORAL_TABLET | Freq: Two times a day (BID) | ORAL | 1 refills | Status: DC | PRN

## 2018-09-26 MED ORDER — METHOCARBAMOL 500 MG PO TABS: 500.0000 mg | ORAL_TABLET | Freq: Three times a day (TID) | ORAL | 1 refills | Status: DC | PRN

## 2018-09-26 MED ORDER — VITAMIN B-12 1000 MCG PO TABS: 1000.0000 ug | ORAL_TABLET | ORAL | Status: DC

## 2018-09-26 NOTE — Assessment & Plan Note (Addendum)
Continues seeing GYN yearly in h/o abnormal paps

## 2018-09-26 NOTE — Assessment & Plan Note (Signed)
Chronic, A1c elevated but still controlled. Encouraged ongoing efforts at following diabetic diet. RTC 3 mo foot exam.

## 2018-09-26 NOTE — Assessment & Plan Note (Signed)
Appreciate neuro care. topamax recently increased to 75mg  daily with benefit.

## 2018-09-26 NOTE — Progress Notes (Signed)
Virtual visit completed through [employer]. Due to national recommendations of social distancing due to COVID-19, a virtual visit is felt to be most appropriate for this patient at this time.   Patient location: home Provider location: [place] [facility], office If any vitals were documented, they were collected by patient at home unless specified below.    BP 111/74 (BP Location: Left Arm, Patient Position: Sitting)   Pulse 62   Temp (!) 97.5 F (36.4 C) (Oral)   Ht 5\' 3"  (1.6 m)   Wt 165 lb 2 oz (74.9 kg)   BMI 29.25 kg/m    CC: AMW f/u visit Subjective:    Patient ID: Dana [name], female    DOB: 09-Nov-1945, 73 y.o.   MRN: [id number]  HPI: Dana [name] is a 73 y.o. female presenting on 09/26/2018 for Annual Exam (Pt 2. )   Saw Dana [name] last week for medicare wellness visit. Note reviewed.    Chronic complicated migraines saw neuro Dana [name]) and started on topamax with benefit. Up to topamax 75mg  daily.   Preventative: COLONOSCOPY 11/2005;diverticulosis o/w WNL Dana [name]). cologuard WNL 2017, will sign up for repeat this year.  Breast cancer screening -3D mammogram 04/2018 at [place] screening -not eligible Well woman examwith GYN Dr Dana [name]. Normal pap smears.upcoming appt 10/2018. DEXA 2015 spine -0.4, hip -1.0 WNL Flu shot -yearly Td unsure Pneumovax 2012, prevnar 2015, pneumovax 2017 zostavax -2010. h/o shingles x2 shingrix - 02/2018, 05/2018 Advanced directive discussion - durable POA is [name] - no HCPOA set up. Has living will. Full code. Does not want prolonged life support by extraordinary means. Scanned into chart 09/2016. Seat belt use discussed  Sunscreen use and skin screen discussed Non smoker  Alcohol - rare  Dentist - due Eye exam yearly 04/2018 Bowels - no constipation Bladder - no incontinence  Patient is married and lives at home with her [name]). Patient is retired. EHU:[id number] education.  Right  handed.  Exercise 3 times a week.  Diet: good water, fruits/vegetables daily - joined weight watchers.  Caffeine:None.      Relevant past medical, surgical, family and social history reviewed and updated as indicated. Interim medical history since our last visit reviewed. Allergies and medications reviewed and updated. Outpatient Medications Prior to Visit  Medication Sig Dispense Refill  . acetaminophen (TYLENOL) 325 MG tablet Take 650 mg by mouth every 6 (six) hours as needed for mild pain.     Marland Kitchen aspirin 81 MG tablet Take 81 mg by mouth daily.    . butalbital-acetaminophen-caffeine (FIORICET, ESGIC) 50-325-40 MG tablet Take 1 tablet by mouth every 6 (six) hours as needed for headache. 10 tablet 5  . co-enzyme Q-10 30 MG capsule Take 30 mg by mouth daily.     . fluticasone (FLONASE) 50 MCG/ACT nasal spray USE 2 SPRAYS IN EACH NOSTRIL EVERY DAY 48 g 1  . furosemide (LASIX) 20 MG tablet TAKE 1 TABLET BY MOUTH DAILY AS NEEDED FOR FLUID. 30 tablet 1  . Multiple Vitamin (MULTIVITAMIN) tablet Take 1 tablet by mouth daily.    Marland Kitchen omeprazole (PRILOSEC) 20 MG capsule TAKE 1 CAPSULE EVERY DAY 90 capsule 1  . ondansetron (ZOFRAN ODT) 4 MG disintegrating tablet Take 1 tablet (4 mg total) by mouth every 8 (eight) hours as needed. 20 tablet 6  . topiramate (TOPAMAX) 25 MG tablet Take 3 tablets (75 mg total) by mouth at bedtime. Take 3 tablets at bedtime 270 tablet 1  . triamcinolone  cream (KENALOG) 0.1 % Apply 1 application topically 2 (two) times daily. 30 g 0  . atorvastatin (LIPITOR) 40 MG tablet Take 1 tablet (40 mg total) by mouth daily. 30 tablet 0  . IBU 600 MG tablet TAKE 1 TABLET (600 MG TOTAL) BY MOUTH 2 (TWO) TIMES DAILY AS NEEDED FOR HEADACHE (MIGRAINES). 30 tablet 0  . methocarbamol (ROBAXIN) 500 MG tablet TAKE 1 TABLET (500 MG TOTAL) BY MOUTH 3 (THREE) TIMES DAILY AS NEEDED FOR MUSCLE SPASMS. 60 tablet 0  . vitamin B-12 (CYANOCOBALAMIN) 1000 MCG tablet Take 1,000 mcg by mouth daily.     No  facility-administered medications prior to visit.      Per HPI unless specifically indicated in ROS section below Review of Systems Objective:    BP 111/74 (BP Location: Left Arm, Patient Position: Sitting)   Pulse 62   Temp (!) 97.5 F (36.4 C) (Oral)   Ht 5\' 3"  (1.6 m)   Wt 165 lb 2 oz (74.9 kg)   BMI 29.25 kg/m   Wt Readings from Last 3 Encounters:  09/26/18 165 lb 2 oz (74.9 kg)  05/08/18 176 lb 12 oz (80.2 kg)  04/25/18 177 lb 8 oz (80.5 kg)     Physical exam: Gen: alert, NAD, not ill appearing Pulm: speaks in complete sentences without increased work of breathing Psych: normal mood, normal thought content      Results for orders placed or performed in visit on 09/20/18  TSH  Result Value Ref Range   TSH 1.97 0.35 - 4.50 uIU/mL  Vitamin B12  Result Value Ref Range   Vitamin B-12 >1500 (H) 211 - 911 pg/mL  Hemoglobin A1c  Result Value Ref Range   Hgb A1c MFr Bld 6.7 (H) 4.6 - 6.5 %  Microalbumin / creatinine urine ratio  Result Value Ref Range   Microalb, Ur 5.0 (H) 0.0 - 1.9 mg/dL   Creatinine,U 82.7 mg/dL   Microalb Creat Ratio 6.0 0.0 - 30.0 mg/g  Comprehensive metabolic panel  Result Value Ref Range   Sodium 142 135 - 145 mEq/L   Potassium 4.0 3.5 - 5.1 mEq/L   Chloride 109 96 - 112 mEq/L   CO2 25 19 - 32 mEq/L   Glucose, Bld 106 (H) 70 - 99 mg/dL   BUN 16 6 - 23 mg/dL   Creatinine, Ser 0.72 0.40 - 1.20 mg/dL   Total Bilirubin 0.6 0.2 - 1.2 mg/dL   Alkaline Phosphatase 77 39 - 117 U/L   AST 17 0 - 37 U/L   ALT 20 0 - 35 U/L   Total Protein 7.2 6.0 - 8.3 g/dL   Albumin 4.2 3.5 - 5.2 g/dL   Calcium 9.3 8.4 - 10.5 mg/dL   GFR 79.70 >60.00 mL/min  Lipid panel  Result Value Ref Range   Cholesterol 141 0 - 200 mg/dL   Triglycerides 102.0 0.0 - 149.0 mg/dL   HDL 43.00 >39.00 mg/dL   VLDL 20.4 0.0 - 40.0 mg/dL   LDL Cholesterol 78 0 - 99 mg/dL   Total CHOL/HDL Ratio 3    NonHDL 98.06    Assessment & Plan:   Problem List Items Addressed This Visit     Obesity, Class I, BMI 30.0-34.9 (see actual BMI)    Congratulated on weight loss to date = she is motivated to continue this through [employer]      LGSIL Pap smear of vaginal cuff    Continues seeing GYN yearly in h/o abnormal paps  HLD (hyperlipidemia)    Chronic, stable. Continue statin. The 10-year ASCVD risk score Mikey [name] DC Brooke Bonito., et al., 2013) is: 14.3%   Values used to calculate the score:     Age: 5 years     Sex: Female     Is Non-Hispanic African American: No     Diabetic: Yes     Tobacco smoker: No     Systolic Blood Pressure: 540 mmHg     Is BP treated: No     HDL Cholesterol: 43 mg/dL     Total Cholesterol: 141 mg/dL       Relevant Medications   atorvastatin (LIPITOR) 40 MG tablet   Controlled type 2 diabetes mellitus without complication, without long-term current use of insulin (HCC) - Primary    Chronic, A1c elevated but still controlled. Encouraged ongoing efforts at following diabetic diet. RTC 3 mo foot exam.       Relevant Medications   atorvastatin (LIPITOR) 40 MG tablet   Complicated migraine    Appreciate neuro care. topamax recently increased to 75mg  daily with benefit.       Relevant Medications   methocarbamol (ROBAXIN) 500 MG tablet   ibuprofen (IBU) 600 MG tablet   atorvastatin (LIPITOR) 40 MG tablet       Meds ordered this encounter  Medications  . vitamin B-12 (CYANOCOBALAMIN) 1000 MCG tablet    Sig: Take 1 tablet (1,000 mcg total) by mouth every Monday, Wednesday, and Friday.  . methocarbamol (ROBAXIN) 500 MG tablet    Sig: Take 1 tablet (500 mg total) by mouth 3 (three) times daily as needed for muscle spasms (sedation precautions).    Dispense:  60 tablet    Refill:  1  . ibuprofen (IBU) 600 MG tablet    Sig: Take 1 tablet (600 mg total) by mouth 2 (two) times daily as needed for headache (migraine).    Dispense:  30 tablet    Refill:  1  . atorvastatin (LIPITOR) 40 MG tablet    Sig: Take 1 tablet (40 mg total) by  mouth daily.    Dispense:  90 tablet    Refill:  3   No orders of the defined types were placed in this encounter.   Follow up plan: Return in about 3 months (around 12/27/2018) for follow up visit.  [name], MD

## 2018-09-26 NOTE — Assessment & Plan Note (Signed)
Chronic, stable. Continue statin. The 10-year ASCVD risk score Mikey Bussing DC Brooke Bonito., et al., 2013) is: 14.3%   Values used to calculate the score:     Age: 73 years     Sex: Female     Is Non-Hispanic African American: No     Diabetic: Yes     Tobacco smoker: No     Systolic Blood Pressure: 607 mmHg     Is BP treated: No     HDL Cholesterol: 43 mg/dL     Total Cholesterol: 141 mg/dL

## 2018-09-26 NOTE — Assessment & Plan Note (Signed)
Congratulated on weight loss to date = she is motivated to continue this through [employer]

## 2018-10-02 NOTE — Progress Notes (Signed)
I reviewed health advisor's note, was available for consultation, and agree with documentation and plan.  

## 2018-10-18 ENCOUNTER — Telehealth: Payer: Self-pay | Admitting: Family Medicine

## 2018-10-18 DIAGNOSIS — Z1283 Encounter for screening for malignant neoplasm of skin: Secondary | ICD-10-CM

## 2018-10-18 NOTE — Telephone Encounter (Signed)
At husband's appt , wife requests derm referral for skin cancer screening

## 2018-10-23 DIAGNOSIS — Z01419 Encounter for gynecological examination (general) (routine) without abnormal findings: Secondary | ICD-10-CM | POA: Diagnosis not present

## 2018-10-23 DIAGNOSIS — Z1272 Encounter for screening for malignant neoplasm of vagina: Secondary | ICD-10-CM | POA: Diagnosis not present

## 2018-10-24 ENCOUNTER — Encounter: Payer: Self-pay | Admitting: Family Medicine

## 2018-10-24 DIAGNOSIS — N3281 Overactive bladder: Secondary | ICD-10-CM | POA: Insufficient documentation

## 2018-10-26 ENCOUNTER — Other Ambulatory Visit: Payer: Self-pay | Admitting: Family Medicine

## 2018-10-26 NOTE — Telephone Encounter (Signed)
Ibuprofen Last filled:  10/14/18, #30 Last OV:  09/26/18, f/u Next OV:  12/27/18, CPE Pt 2

## 2018-10-29 ENCOUNTER — Ambulatory Visit: Payer: Medicare HMO

## 2018-11-05 DIAGNOSIS — L814 Other melanin hyperpigmentation: Secondary | ICD-10-CM | POA: Diagnosis not present

## 2018-11-05 DIAGNOSIS — M7130 Other bursal cyst, unspecified site: Secondary | ICD-10-CM | POA: Diagnosis not present

## 2018-11-05 DIAGNOSIS — L821 Other seborrheic keratosis: Secondary | ICD-10-CM | POA: Diagnosis not present

## 2018-11-05 DIAGNOSIS — Z1283 Encounter for screening for malignant neoplasm of skin: Secondary | ICD-10-CM | POA: Diagnosis not present

## 2018-11-05 DIAGNOSIS — D225 Melanocytic nevi of trunk: Secondary | ICD-10-CM | POA: Diagnosis not present

## 2018-12-12 ENCOUNTER — Telehealth: Payer: Self-pay

## 2018-12-12 NOTE — Telephone Encounter (Signed)
Pt called to ck on next appt date and would it be in office. Pt has no covid symptoms, no travel and no known exposure to + covid. Verified appt on 12/27/18 at 10:30 pt voiced understanding and will cb if any covid symptoms prior to in office appt.

## 2018-12-13 ENCOUNTER — Other Ambulatory Visit: Payer: Self-pay | Admitting: Family Medicine

## 2018-12-27 ENCOUNTER — Ambulatory Visit: Payer: Medicare HMO | Admitting: Family Medicine

## 2019-01-23 ENCOUNTER — Ambulatory Visit (INDEPENDENT_AMBULATORY_CARE_PROVIDER_SITE_OTHER): Payer: Medicare HMO | Admitting: Family Medicine

## 2019-01-23 ENCOUNTER — Encounter: Payer: Self-pay | Admitting: Family Medicine

## 2019-01-23 ENCOUNTER — Other Ambulatory Visit: Payer: Self-pay

## 2019-01-23 VITALS — BP 124/68 | HR 70 | Temp 97.8°F | Ht 63.0 in | Wt 165.5 lb

## 2019-01-23 DIAGNOSIS — Z23 Encounter for immunization: Secondary | ICD-10-CM

## 2019-01-23 DIAGNOSIS — E663 Overweight: Secondary | ICD-10-CM | POA: Diagnosis not present

## 2019-01-23 DIAGNOSIS — E119 Type 2 diabetes mellitus without complications: Secondary | ICD-10-CM | POA: Diagnosis not present

## 2019-01-23 DIAGNOSIS — G43109 Migraine with aura, not intractable, without status migrainosus: Secondary | ICD-10-CM | POA: Diagnosis not present

## 2019-01-23 LAB — POCT GLYCOSYLATED HEMOGLOBIN (HGB A1C): Hemoglobin A1C: 6.3 % — AB (ref 4.0–5.6)

## 2019-01-23 MED ORDER — METHOCARBAMOL 500 MG PO TABS: 500.0000 mg | ORAL_TABLET | Freq: Three times a day (TID) | ORAL | 1 refills | Status: DC | PRN

## 2019-01-23 MED ORDER — TOPIRAMATE 25 MG PO TABS: 75.0000 mg | ORAL_TABLET | Freq: Every day | ORAL | 1 refills | Status: DC

## 2019-01-23 NOTE — Assessment & Plan Note (Signed)
Chronic, improved, A1c now in prediabetes range. Reviewed healthy diet and lifestyle. Encouraged ongoing efforts at glycemic control. Foot exam today.

## 2019-01-23 NOTE — Patient Instructions (Addendum)
Flu shot today. You are doing well today Sugars are improved! Now back in prediabetes range. Return as needed or in May for next wellness visit.

## 2019-01-23 NOTE — Assessment & Plan Note (Signed)
Congratulated on weight loss noted this year. BMI now in overweight range.

## 2019-01-23 NOTE — Assessment & Plan Note (Signed)
Doing well with topamax 75mg  daily through neurology.

## 2019-01-23 NOTE — Progress Notes (Signed)
This visit was conducted in person.  BP 124/68 (BP Location: Left Arm, Patient Position: Sitting, Cuff Size: Normal)   Pulse 70   Temp 97.8 F (36.6 C) (Temporal)   Ht 5\' 3"  (1.6 m)   Wt 165 lb 8 oz (75.1 kg)   SpO2 96%   BMI 29.32 kg/m    CC: DM f/u visit Subjective:    Patient ID: Dana [name], female    DOB: [birth year], 73 y.o.   MRN: [id number]  HPI: Dana [name] is a 73 y.o. female presenting on 01/23/2019 for Diabetes (Here for 3 mo f/u.) and Discuss Medication (Wants to start getting 90-day refills for Topamax and Robaxin sent to [facility] order. )   Recent trip to [place] and beach.   Requests topamax and robaxin refilled starting 01/29/2019 due to new insurance. Advised topamax is prescribed through neurology.   DM - does not regularly check sugars. Compliant with antihyperglycemic regimen which includes: diet controlled. She has limited sweets these last few months. Finds topamax has helped decrease appetite. Increased walking as well. Denies low sugars or hypoglycemic symptoms. Denies paresthesias. Last diabetic eye exam 04/2018. Pneumovax: 2017. Prevnar: 2015. Glucometer brand: does not have this. DSME: completed 1 class 07/2017. She has been going through DSME with her husband.  Lab Results  Component Value Date   HGBA1C 6.3 (A) 01/23/2019   Diabetic Foot Exam - Simple   Simple Foot Form Diabetic Foot exam was performed with the following findings: Yes 01/23/2019 10:14 AM  Visual Inspection No deformities, no ulcerations, no other skin breakdown bilaterally: Yes Sensation Testing Intact to touch and monofilament testing bilaterally: Yes Pulse Check Posterior Tibialis and Dorsalis pulse intact bilaterally: Yes Comments    Lab Results  Component Value Date   MICROALBUR 5.0 (H) 09/20/2018         Relevant past medical, surgical, family and social history reviewed and updated as indicated. Interim medical history since our last visit reviewed. Allergies  and medications reviewed and updated. Outpatient Medications Prior to Visit  Medication Sig Dispense Refill  . acetaminophen (TYLENOL) 325 MG tablet Take 650 mg by mouth every 6 (six) hours as needed for mild pain.     Marland Kitchen aspirin 81 MG tablet Take 81 mg by mouth daily.    Marland Kitchen atorvastatin (LIPITOR) 40 MG tablet Take 1 tablet (40 mg total) by mouth daily. 90 tablet 3  . butalbital-acetaminophen-caffeine (FIORICET, ESGIC) 50-325-40 MG tablet Take 1 tablet by mouth every 6 (six) hours as needed for headache. 10 tablet 5  . co-enzyme Q-10 30 MG capsule Take 30 mg by mouth daily.     . fluticasone (FLONASE) 50 MCG/ACT nasal spray USE 2 SPRAYS IN EACH NOSTRIL EVERY DAY 48 g 1  . furosemide (LASIX) 20 MG tablet TAKE 1 TABLET BY MOUTH DAILY AS NEEDED FOR FLUID. 30 tablet 1  . ibuprofen (ADVIL) 600 MG tablet TAKE 1 TABLET (600 MG TOTAL) BY MOUTH 2 (TWO) TIMES DAILY AS NEEDED FOR HEADACHE (MIGRAINE). 30 tablet 1  . Multiple Vitamin (MULTIVITAMIN) tablet Take 1 tablet by mouth daily.    Marland Kitchen omeprazole (PRILOSEC) 20 MG capsule TAKE 1 CAPSULE EVERY DAY 90 capsule 1  . ondansetron (ZOFRAN ODT) 4 MG disintegrating tablet Take 1 tablet (4 mg total) by mouth every 8 (eight) hours as needed. 20 tablet 6  . topiramate (TOPAMAX) 25 MG tablet Take 3 tablets (75 mg total) by mouth at bedtime. Take 3 tablets at bedtime 270 tablet 1  .  triamcinolone cream (KENALOG) 0.1 % Apply 1 application topically 2 (two) times daily. 30 g 0  . vitamin B-12 (CYANOCOBALAMIN) 1000 MCG tablet Take 1 tablet (1,000 mcg total) by mouth every Monday, Wednesday, and Friday.    . methocarbamol (ROBAXIN) 500 MG tablet Take 1 tablet (500 mg total) by mouth 3 (three) times daily as needed for muscle spasms (sedation precautions). 60 tablet 1  . topiramate (TOPAMAX) 25 MG tablet Take 3 tablets (75 mg total) by mouth at bedtime. Take 3 tablets at bedtime 270 tablet 1   No facility-administered medications prior to visit.      Per HPI unless  specifically indicated in ROS section below Review of Systems Objective:    BP 124/68 (BP Location: Left Arm, Patient Position: Sitting, Cuff Size: Normal)   Pulse 70   Temp 97.8 F (36.6 C) (Temporal)   Ht 5\' 3"  (1.6 m)   Wt 165 lb 8 oz (75.1 kg)   SpO2 96%   BMI 29.32 kg/m   Wt Readings from Last 3 Encounters:  01/23/19 165 lb 8 oz (75.1 kg)  09/26/18 165 lb 2 oz (74.9 kg)  05/08/18 176 lb 12 oz (80.2 kg)    Physical Exam Vitals signs and nursing note reviewed.  Constitutional:      General: She is not in acute distress.    Appearance: Normal appearance. She is well-developed. She is not ill-appearing.  HENT:     Head: Normocephalic and atraumatic.     Right Ear: External ear normal.     Left Ear: External ear normal.     Nose: Nose normal.     Mouth/Throat:     Mouth: Mucous membranes are moist.     Pharynx: No oropharyngeal exudate.  Eyes:     General: No scleral icterus.    Conjunctiva/sclera: Conjunctivae normal.     Pupils: Pupils are equal, round, and reactive to light.  Neck:     Musculoskeletal: Normal range of motion and neck supple.  Cardiovascular:     Rate and Rhythm: Normal rate and regular rhythm.     Pulses: Normal pulses.     Heart sounds: Normal heart sounds. No murmur.  Pulmonary:     Effort: Pulmonary effort is normal. No respiratory distress.     Breath sounds: Normal breath sounds. No wheezing, rhonchi or rales.  Musculoskeletal:     Comments: See HPI for foot exam if done  Lymphadenopathy:     Cervical: No cervical adenopathy.  Skin:    General: Skin is warm and dry.     Findings: No rash.  Neurological:     General: No focal deficit present.     Mental Status: She is alert.  Psychiatric:        Mood and Affect: Mood normal.        Behavior: Behavior normal.       Results for orders placed or performed in visit on 01/23/19  POCT glycosylated hemoglobin (Hb A1C)  Result Value Ref Range   Hemoglobin A1C 6.3 (A) 4.0 - 5.6 %   HbA1c  POC (<> result, manual entry)     HbA1c, POC (prediabetic range)     HbA1c, POC (controlled diabetic range)     Assessment & Plan:  Topamax and robaxin accidentally refilled - I called [facility] and cancelled these prescriptions.  Problem List Items Addressed This Visit    Overweight with body mass index (BMI) 25.0-29.9    Congratulated on weight loss noted  this year. BMI now in overweight range.       Controlled type 2 diabetes mellitus without complication, without long-term current use of insulin (HCC) - Primary    Chronic, improved, A1c now in prediabetes range. Reviewed healthy diet and lifestyle. Encouraged ongoing efforts at glycemic control. Foot exam today.       Relevant Orders   POCT glycosylated hemoglobin (Hb A1C) (Completed)   Complicated migraine    Doing well with topamax 75mg  daily through neurology.       Relevant Medications   methocarbamol (ROBAXIN) 500 MG tablet   topiramate (TOPAMAX) 25 MG tablet    Other Visit Diagnoses    Need for influenza vaccination       Relevant Orders   Flu Vaccine QUAD High Dose(Fluad) (Completed)       Meds ordered this encounter  Medications  . DISCONTD: topiramate (TOPAMAX) 25 MG tablet    Sig: Take 3 tablets (75 mg total) by mouth at bedtime. Take 3 tablets at bedtime    Dispense:  270 tablet    Refill:  1  . methocarbamol (ROBAXIN) 500 MG tablet    Sig: Take 1 tablet (500 mg total) by mouth 3 (three) times daily as needed for muscle spasms (sedation precautions).    Dispense:  60 tablet    Refill:  1   Orders Placed This Encounter  Procedures  . Flu Vaccine QUAD High Dose(Fluad)  . POCT glycosylated hemoglobin (Hb A1C)    Follow up plan: Return for annual exam, prior fasting for blood work, medicare wellness visit.  [name], MD

## 2019-01-30 ENCOUNTER — Telehealth: Payer: Self-pay | Admitting: Neurology

## 2019-01-30 NOTE — Telephone Encounter (Signed)
I called pt and she wants to take ubrelby (seen on TV).  She wanted to know if ok to take with topamax.  I relayed yes this is ok to do.  She will discuss at appt.  (02-28-19 at 0945).  She verbalized understanding.  Had migraine in mtns recently lasted 3 days.  Better now.

## 2019-01-30 NOTE — Telephone Encounter (Signed)
Pt called wanting to know if it will be beneficial for her to take Ubrelvy along with her current medication topiramate (TOPAMAX) 25 MG tablet for when she gets headaches. Please advise.

## 2019-01-31 ENCOUNTER — Other Ambulatory Visit: Payer: Self-pay

## 2019-01-31 NOTE — Telephone Encounter (Signed)
Refill request from [facility] for Ibuprofen 600 mg. Last filled on 10/26/2018 #30 with 1 refill and sent to local pharmacy. LOV 01/23/2019 for follow up Next appointment on 09/27/2019

## 2019-02-01 MED ORDER — IBUPROFEN 600 MG PO TABS: 600.0000 mg | ORAL_TABLET | Freq: Two times a day (BID) | ORAL | 1 refills | Status: DC | PRN

## 2019-02-25 ENCOUNTER — Other Ambulatory Visit: Payer: Self-pay

## 2019-02-25 ENCOUNTER — Encounter: Payer: Self-pay | Admitting: Family Medicine

## 2019-02-25 ENCOUNTER — Ambulatory Visit (INDEPENDENT_AMBULATORY_CARE_PROVIDER_SITE_OTHER): Payer: Medicare HMO | Admitting: Family Medicine

## 2019-02-25 DIAGNOSIS — R238 Other skin changes: Secondary | ICD-10-CM

## 2019-02-25 NOTE — Patient Instructions (Signed)
Likely streak from trauma to skin ?scratch in your sleep.  Less likely skin infection - treat with antibiotic ointment twice daily (neosporin or triple antibiotic).  Let us know if not continuing to improve each day.

## 2019-02-25 NOTE — Progress Notes (Signed)
This visit was conducted in person.  BP 120/62 (BP Location: Left Arm, Patient Position: Sitting, Cuff Size: Normal)   Pulse 70   Temp 97.6 F (36.4 C) (Temporal)   Ht 5\' 3"  (1.6 m)   Wt 166 lb 7 oz (75.5 kg)   SpO2 97%   BMI 29.48 kg/m    CC: check skin on forehead Subjective:    Patient ID: Dana [name], female    DOB: [birth year], 73 y.o.   MRN: [id number]  HPI: Dana [name] is a 73 y.o. female presenting on 02/25/2019 for Skin Problem (C/o red streak on forehead.  Noticed on 02/23/19.  Seems to be improving.  Area sometimes itches. )   Traveled to [place] last week to meet with sister in law from [facility], stayed in a condo ?new laundry detergent.  Traveling to [facility] this evening to visit brother who is sick (lung issues coming off vent).   Noted red streak on forehead 02/23/2019. Woke up with this. Seems to be improving. It is sensitive to touch but not painful or itchy otherwise.    No change in products, lotions, soaps or shampoo.  She had shingrix vaccine.      Relevant past medical, surgical, family and social history reviewed and updated as indicated. Interim medical history since our last visit reviewed. Allergies and medications reviewed and updated. Outpatient Medications Prior to Visit  Medication Sig Dispense Refill  . acetaminophen (TYLENOL) 325 MG tablet Take 650 mg by mouth every 6 (six) hours as needed for mild pain.     Marland Kitchen aspirin 81 MG tablet Take 81 mg by mouth daily.    Marland Kitchen atorvastatin (LIPITOR) 40 MG tablet Take 1 tablet (40 mg total) by mouth daily. 90 tablet 3  . butalbital-acetaminophen-caffeine (FIORICET, ESGIC) 50-325-40 MG tablet Take 1 tablet by mouth every 6 (six) hours as needed for headache. 10 tablet 5  . co-enzyme Q-10 30 MG capsule Take 30 mg by mouth daily.     . fluticasone (FLONASE) 50 MCG/ACT nasal spray USE 2 SPRAYS IN EACH NOSTRIL EVERY DAY 48 g 1  . furosemide (LASIX) 20 MG tablet TAKE 1 TABLET BY MOUTH DAILY AS NEEDED FOR FLUID. 30  tablet 1  . ibuprofen (ADVIL) 600 MG tablet Take 1 tablet (600 mg total) by mouth 2 (two) times daily as needed for headache (migraine). 30 tablet 1  . methocarbamol (ROBAXIN) 500 MG tablet Take 1 tablet (500 mg total) by mouth 3 (three) times daily as needed for muscle spasms (sedation precautions). 60 tablet 1  . Multiple Vitamin (MULTIVITAMIN) tablet Take 1 tablet by mouth daily.    Marland Kitchen omeprazole (PRILOSEC) 20 MG capsule TAKE 1 CAPSULE EVERY DAY 90 capsule 1  . ondansetron (ZOFRAN ODT) 4 MG disintegrating tablet Take 1 tablet (4 mg total) by mouth every 8 (eight) hours as needed. 20 tablet 6  . topiramate (TOPAMAX) 25 MG tablet Take 3 tablets (75 mg total) by mouth at bedtime. Take 3 tablets at bedtime 270 tablet 1  . triamcinolone cream (KENALOG) 0.1 % Apply 1 application topically 2 (two) times daily. 30 g 0  . vitamin B-12 (CYANOCOBALAMIN) 1000 MCG tablet Take 1 tablet (1,000 mcg total) by mouth every Monday, Wednesday, and Friday.     No facility-administered medications prior to visit.      Per HPI unless specifically indicated in ROS section below Review of Systems Objective:    BP 120/62 (BP Location: Left Arm, Patient Position: Sitting, Cuff Size:  Normal)   Pulse 70   Temp 97.6 F (36.4 C) (Temporal)   Ht 5\' 3"  (1.6 m)   Wt 166 lb 7 oz (75.5 kg)   SpO2 97%   BMI 29.48 kg/m   Wt Readings from Last 3 Encounters:  02/25/19 166 lb 7 oz (75.5 kg)  01/23/19 165 lb 8 oz (75.1 kg)  09/26/18 165 lb 2 oz (74.9 kg)    Physical Exam Vitals signs and nursing note reviewed.  Constitutional:      Appearance: Normal appearance. She is not ill-appearing.  Skin:    General: Skin is warm and dry.     Findings: Erythema and rash present.     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       Assessment & Plan:   Problem List  Items Addressed This Visit    Red skin    Isolated streak of redness to forehead anticipate from unintentional scratching of forehead in her sleep. Already improving. rec continue to monitor, if worsening redness start OTC topical antibiotic like neosporin, update if not improved.           No orders of the defined types were placed in this encounter.  No orders of the defined types were placed in this encounter.   Follow up plan: No follow-ups on file.  [name], MD

## 2019-02-25 NOTE — Assessment & Plan Note (Signed)
Isolated streak of redness to forehead anticipate from unintentional scratching of forehead in her sleep. Already improving. rec continue to monitor, if worsening redness start OTC topical antibiotic like neosporin, update if not improved.

## 2019-02-26 ENCOUNTER — Ambulatory Visit: Payer: Medicare HMO | Admitting: Family Medicine

## 2019-02-28 ENCOUNTER — Ambulatory Visit: Payer: Self-pay | Admitting: Neurology

## 2019-03-28 ENCOUNTER — Other Ambulatory Visit: Payer: Self-pay | Admitting: Neurology

## 2019-04-09 ENCOUNTER — Ambulatory Visit (INDEPENDENT_AMBULATORY_CARE_PROVIDER_SITE_OTHER): Payer: Medicare HMO | Admitting: Family Medicine

## 2019-04-09 ENCOUNTER — Other Ambulatory Visit: Payer: Self-pay

## 2019-04-09 ENCOUNTER — Encounter: Payer: Self-pay | Admitting: Family Medicine

## 2019-04-09 VITALS — BP 138/70 | HR 66 | Temp 98.1°F | Ht 63.0 in | Wt 166.4 lb

## 2019-04-09 DIAGNOSIS — L282 Other prurigo: Secondary | ICD-10-CM | POA: Diagnosis not present

## 2019-04-09 DIAGNOSIS — R21 Rash and other nonspecific skin eruption: Secondary | ICD-10-CM | POA: Diagnosis not present

## 2019-04-09 DIAGNOSIS — M7050 Other bursitis of knee, unspecified knee: Secondary | ICD-10-CM

## 2019-04-09 MED ORDER — TRIAMCINOLONE ACETONIDE 0.1 % EX CREA: 1.0000 "application " | TOPICAL_CREAM | Freq: Two times a day (BID) | CUTANEOUS | 0 refills | Status: DC

## 2019-04-09 NOTE — Assessment & Plan Note (Addendum)
Under breasts bilaterally, anticipate mild intertrigo. rec OTC lotrimin BID + triamcinolone cream PRN sparingly for inflammation. Pt agrees with plan.

## 2019-04-09 NOTE — Progress Notes (Signed)
This visit was conducted in person.  BP 138/70 (BP Location: Left Arm, Patient Position: Sitting, Cuff Size: Normal)   Pulse 66   Temp 98.1 F (36.7 C) (Temporal)   Ht 5\' 3"  (1.6 m)   Wt 166 lb 6 oz (75.5 kg)   SpO2 96%   BMI 29.47 kg/m    CC: R leg pain  Subjective:    Patient ID: Dana [name], female    DOB: Dec 09, 1945, 73 y.o.   MRN: [id number]  HPI: Dana [name] is a 73 y.o. female presenting on 04/09/2019 for Leg Pain (C/o right leg pain just below knee.  Started 2 wks ago.  Denies injury but has to pull herself up to get into husband's truck. Tried ibuprofen and Salon Pas, helpful. )   2 wk h/o R leg pain medial leg just below knee. Denies inciting trauma/injury or falls. Worse pain when trying to get into husband's truck and with prolonged walking. Treating with ibuprofen and salon pas with benefit.   Requests triamcinolone cream refilled for rash under breasts - itchy, red, worse when she sweats.      Relevant past medical, surgical, family and social history reviewed and updated as indicated. Interim medical history since our last visit reviewed. Allergies and medications reviewed and updated. Outpatient Medications Prior to Visit  Medication Sig Dispense Refill  . acetaminophen (TYLENOL) 325 MG tablet Take 650 mg by mouth every 6 (six) hours as needed for mild pain.     Marland Kitchen aspirin 81 MG tablet Take 81 mg by mouth daily.    Marland Kitchen atorvastatin (LIPITOR) 40 MG tablet Take 1 tablet (40 mg total) by mouth daily. 90 tablet 3  . butalbital-acetaminophen-caffeine (FIORICET, ESGIC) 50-325-40 MG tablet Take 1 tablet by mouth every 6 (six) hours as needed for headache. 10 tablet 5  . co-enzyme Q-10 30 MG capsule Take 30 mg by mouth daily.     . fluticasone (FLONASE) 50 MCG/ACT nasal spray USE 2 SPRAYS IN EACH NOSTRIL EVERY DAY 48 g 1  . furosemide (LASIX) 20 MG tablet TAKE 1 TABLET BY MOUTH DAILY AS NEEDED FOR FLUID. 30 tablet 1  . ibuprofen (ADVIL) 600 MG tablet Take 1 tablet (600  mg total) by mouth 2 (two) times daily as needed for headache (migraine). 30 tablet 1  . methocarbamol (ROBAXIN) 500 MG tablet Take 1 tablet (500 mg total) by mouth 3 (three) times daily as needed for muscle spasms (sedation precautions). 60 tablet 1  . Multiple Vitamin (MULTIVITAMIN) tablet Take 1 tablet by mouth daily.    Marland Kitchen omeprazole (PRILOSEC) 20 MG capsule TAKE 1 CAPSULE EVERY DAY 90 capsule 1  . ondansetron (ZOFRAN ODT) 4 MG disintegrating tablet Take 1 tablet (4 mg total) by mouth every 8 (eight) hours as needed. 20 tablet 6  . topiramate (TOPAMAX) 25 MG tablet Take 3 tablets (75 mg total) by mouth at bedtime. Take 3 tablets at bedtime 270 tablet 1  . vitamin B-12 (CYANOCOBALAMIN) 1000 MCG tablet Take 1 tablet (1,000 mcg total) by mouth every Monday, Wednesday, and Friday.    . triamcinolone cream (KENALOG) 0.1 % Apply 1 application topically 2 (two) times daily. 30 g 0   No facility-administered medications prior to visit.      Per HPI unless specifically indicated in ROS section below Review of Systems Objective:    BP 138/70 (BP Location: Left Arm, Patient Position: Sitting, Cuff Size: Normal)   Pulse 66   Temp 98.1 F (36.7 C) (Temporal)  Ht 5\' 3"  (1.6 m)   Wt 166 lb 6 oz (75.5 kg)   SpO2 96%   BMI 29.47 kg/m   Wt Readings from Last 3 Encounters:  04/09/19 166 lb 6 oz (75.5 kg)  02/25/19 166 lb 7 oz (75.5 kg)  01/23/19 165 lb 8 oz (75.1 kg)    Physical Exam Vitals signs and nursing note reviewed.  Constitutional:      General: She is not in acute distress.    Appearance: Normal appearance. She is not ill-appearing.  Musculoskeletal: Normal range of motion.        General: Tenderness present. No swelling.     Comments:  Bilateral knee exam: No deformity on inspection. Tender to palpation at R pes anserine bursa  No effusion/swelling noted. FROM in flex/extension without crepitus. No popliteal fullness.  Skin:    General: Skin is warm and dry.     Findings:  Erythema and rash present.     Comments: Itchy erythematous patches below bilateral breasts with some abrasions  Neurological:     Mental Status: She is alert.  Psychiatric:        Mood and Affect: Mood normal.        Behavior: Behavior normal.       Results for orders placed or performed in visit on 01/23/19  POCT glycosylated hemoglobin (Hb A1C)  Result Value Ref Range   Hemoglobin A1C 6.3 (A) 4.0 - 5.6 %   HbA1c POC (<> result, manual entry)     HbA1c, POC (prediabetic range)     HbA1c, POC (controlled diabetic range)     Assessment & Plan:  This visit occurred during the SARS-CoV-2 public health emergency.  Safety protocols were in place, including screening questions prior to the visit, additional usage of staff PPE, and extensive cleaning of exam room while observing appropriate contact time as indicated for disinfecting solutions.   Problem List Items Addressed This Visit    Skin rash    Under breasts bilaterally, anticipate mild intertrigo. rec OTC lotrimin BID + triamcinolone cream PRN sparingly for inflammation. Pt agrees with plan.       Pes anserine bursitis - Primary    On right. Treat with voltaren gel. Provided with stretching exercises from [facility] pt advisor today. Update if not improving with treatment.        Other Visit Diagnoses    Pruritic rash       Relevant Medications   triamcinolone cream (KENALOG) 0.1 %       Meds ordered this encounter  Medications  . triamcinolone cream (KENALOG) 0.1 %    Sig: Apply 1 application topically 2 (two) times daily.    Dispense:  30 g    Refill:  0   No orders of the defined types were placed in this encounter.  Patient Instructions  For rash, use over the counter lotrimin (clotrimazole) antifungal twice daily, and sparing use of triamcinolone steroid cream (no more than 1 week at a time).  For R leg pain - I think you have knee bursitis (pes anserine bursitis). Treat with exercises provided today as well as OTC  voltaren gel. Avoid direct pressure to the area. Sleep with pillow between legs. Rest the knee.    Follow up plan: Return if symptoms worsen or fail to improve.  [name], MD

## 2019-04-09 NOTE — Patient Instructions (Addendum)
For rash, use over the counter lotrimin (clotrimazole) antifungal twice daily, and sparing use of triamcinolone steroid cream (no more than 1 week at a time).  For R leg pain - I think you have knee bursitis (pes anserine bursitis). Treat with exercises provided today as well as OTC voltaren gel. Avoid direct pressure to the area. Sleep with pillow between legs. Rest the knee.

## 2019-04-09 NOTE — Assessment & Plan Note (Signed)
On right. Treat with voltaren gel. Provided with stretching exercises from [facility] pt advisor today. Update if not improving with treatment.

## 2019-04-11 DIAGNOSIS — E119 Type 2 diabetes mellitus without complications: Secondary | ICD-10-CM | POA: Diagnosis not present

## 2019-04-11 DIAGNOSIS — H2513 Age-related nuclear cataract, bilateral: Secondary | ICD-10-CM | POA: Diagnosis not present

## 2019-04-11 DIAGNOSIS — H16223 Keratoconjunctivitis sicca, not specified as Sjogren's, bilateral: Secondary | ICD-10-CM | POA: Diagnosis not present

## 2019-04-11 DIAGNOSIS — H04123 Dry eye syndrome of bilateral lacrimal glands: Secondary | ICD-10-CM | POA: Diagnosis not present

## 2019-04-11 DIAGNOSIS — H524 Presbyopia: Secondary | ICD-10-CM | POA: Diagnosis not present

## 2019-04-11 DIAGNOSIS — H5213 Myopia, bilateral: Secondary | ICD-10-CM | POA: Diagnosis not present

## 2019-04-11 LAB — HM DIABETES EYE EXAM

## 2019-04-17 ENCOUNTER — Encounter: Payer: Self-pay | Admitting: Family Medicine

## 2019-04-17 ENCOUNTER — Telehealth: Payer: Self-pay | Admitting: Neurology

## 2019-04-17 NOTE — Telephone Encounter (Signed)
Pt called wanting to discuss a concern with the RN or provider about her topiramate (TOPAMAX) 25 MG tablet Please advise.

## 2019-04-17 NOTE — Telephone Encounter (Signed)
I called pt and she is asking about other migraine medications Ubrelvy.  I made her a mychart appt 04-29-19 at 1415.

## 2019-04-29 ENCOUNTER — Other Ambulatory Visit: Payer: Self-pay

## 2019-04-29 ENCOUNTER — Encounter: Payer: Self-pay | Admitting: Neurology

## 2019-04-29 ENCOUNTER — Telehealth (INDEPENDENT_AMBULATORY_CARE_PROVIDER_SITE_OTHER): Payer: Medicare HMO | Admitting: Neurology

## 2019-04-29 DIAGNOSIS — G43109 Migraine with aura, not intractable, without status migrainosus: Secondary | ICD-10-CM

## 2019-04-29 NOTE — Progress Notes (Signed)
Virtual Visit via Video Note  I connected with Dana [name] on 04/29/19 at  2:15 PM EST by a video enabled telemedicine application and verified that I am speaking with the correct person using two identifiers.  Location: Patient: At her home Provider: In the office    I discussed the limitations of evaluation and management by telemedicine and the availability of in person appointments. The patient expressed understanding and agreed to proceed.  History of Present Illness: 04/25/18 YY: Dana [name] our 73 year old female accompanied by her husband, seen in request byher primary care physician Dr.Gutierrez, Javierfor recurrent headaches.  I saw her previously in 2016 for chronic migraine headaches, last visit was on January 14, 2015.  Dana [name] history of migraine since20s, her typical migraine started with visual aura, shadow coming downfromone side of her visual field, lasting 5-10 minutes, then followed by a severe pounding headaches, piercing pain, movementwould exacerbate her headaches, her headache usually lasts for one day.   At time of evaluation in 2016, she complains of increased frequency of headaches, in addition, she reported few episode of headache with associated confusion, language difficulty, longer lasting up to 24 hours,  Shewastaking Relafen as needed for headaches, which seems to work for her,  She lost to follow-up because overall her headache was under good control, only has moderate migraine headachesevery 2 to 3 months, but began to experience more frequent and prolonged headaches since November 2019, in December, she suffered migraine lasting for 5 days, despite multiple home medication treatment, NSAIDs, Excedrin Migraine, Tylenol, without helping her symptoms, also failed to be relieved by emergency treatment, Toradol injection, during the intense headache, she also describes visual distortions, hallucinations, her husband face was  distorted, the bladeof the ceiling fan began to move by itself, she was confused, unsteady gait  Previously she was taking amitriptyline complains of confusion slow thinking with the medications,  I personally reviewed MRI of the brain in January 2019, there was no acute abnormality, generalized atrophy, moderate supratentorium small vessel disease.  CT head in December 2019, there was no acute abnormality  Labs normal BMP, CBC, A1C 6.4  August 29 2018 SS: Topamax 100 mg every night, Fioricet, Zofran, Robaxin, Aleve, Benadryl as needed for headache.  Taking daily aspirin 81 mg.   She reports she has been doing well, was not able to tolerate Topamax 100 mg, because she felt so sleepy.  She has been taking 50 mg at bedtime.  She reports she has not had any bad migraines, will have about 1 minor headache every 1 to 2 weeks.  She describes this headache as starting behind her eyes, feels like a sinus issue.  She does report this is exacerbated by bright light, sunlight.  She tries to wear her sunglasses at all times when outside.  One time she did have to take the Fioricet, she reports it was beneficial.  She had an eye exam recently, she did get new glasses, new lenses.  Before her bad migraines she will get shadows, that is her aura.  She does also complain of some neck pain with her headache. She has not had any episodes of confusion, numbness, trouble walking, or nausea.   Update April 30, 2019 SS: She presents today for follow-up via telephone visit, couldn't connect on My Chart.  She reports overall her headaches have been doing much better, on 75 mg daily of Topamax.  Since April, she had 1 headache in October when she was in [place],  could be due to high altitude, lasted 3 days.  The headache was mild to moderate, she took Fioricet with partial relief, didn't take cocktail of medications.  She indicates her overall health has been good.  She denies any new problems or concerns.  She is  wondering if she might be a good candidate for Ubrelvy.   Observations/Objective: Via telephone visit is alert and oriented, answers questions appropriately, speech is clear and concise  Assessment and Plan: 1.  Chronic migraine headache with aura 2.  Cerebral small vessel disease  Overall, she has done quite well.  She has had 1 headache since April 2020.  She will remain on Topamax 75 mg daily (last filled by her PCP).  She will take Fioricet in combination with Zofran, Robaxin, Aleve, and even Benadryl for severe prolonged headache.  She is not a candidate for triptan medications, due to her age and cerebral small vessel disease.  She may be a good candidate for Ubrelvy in the future, but right now she has only had 1 headache in the last 8 months.  She will remain on aspirin 81 mg daily.  She will follow-up in 6 months or sooner if needed.  Follow Up Instructions: 6 months, 10/28/2018 10:15   I discussed the assessment and treatment plan with the patient. The patient was provided an opportunity to ask questions and all were answered. The patient agreed with the plan and demonstrated an understanding of the instructions.   The patient was advised to call back or seek an in-person evaluation if the symptoms worsen or if the condition fails to improve as anticipated.  I provided 15 minutes of non-face-to-face time during this encounter.  Evangeline Dakin, DNP  [facility] [address] [place], [place] [address] [phone]

## 2019-04-29 NOTE — Patient Instructions (Signed)
For acute headache you may take fiorcet, robaxin, zofran, aleve, even benadryl for severe prolonged headache.  Continue Topamax 75 mg daily.   If headaches increase, let me know.

## 2019-05-06 DIAGNOSIS — Z1231 Encounter for screening mammogram for malignant neoplasm of breast: Secondary | ICD-10-CM | POA: Diagnosis not present

## 2019-05-06 LAB — HM MAMMOGRAPHY

## 2019-05-09 ENCOUNTER — Encounter: Payer: Self-pay | Admitting: Family Medicine

## 2019-05-09 ENCOUNTER — Other Ambulatory Visit: Payer: Self-pay | Admitting: Family Medicine

## 2019-05-13 ENCOUNTER — Telehealth: Payer: Self-pay | Admitting: Family Medicine

## 2019-05-13 NOTE — Telephone Encounter (Signed)
Pt called stating she thought she was going to get a colorgurad kit in mail.  She has not received anything  Please advise

## 2019-05-15 NOTE — Telephone Encounter (Signed)
Spoke with Naval architect. The order was sent in in May 2020 and her last test was done in November 2017 and it was too soon. They will send this out to patient now since she is due now. Called patient and advised.

## 2019-05-28 DIAGNOSIS — Z1212 Encounter for screening for malignant neoplasm of rectum: Secondary | ICD-10-CM | POA: Diagnosis not present

## 2019-05-28 DIAGNOSIS — Z1211 Encounter for screening for malignant neoplasm of colon: Secondary | ICD-10-CM | POA: Diagnosis not present

## 2019-05-31 LAB — COLOGUARD
COLOGUARD: NEGATIVE
Cologuard: NEGATIVE

## 2019-06-03 ENCOUNTER — Telehealth: Payer: Self-pay | Admitting: *Deleted

## 2019-06-03 NOTE — Telephone Encounter (Signed)
Patient called to let Dr. Danise Mina know that she had the covid vaccine on 05/31/19 at [facility]. Patient stated that she had the Moderna vaccine.

## 2019-06-03 NOTE — Telephone Encounter (Signed)
Noted  

## 2019-06-04 ENCOUNTER — Telehealth: Payer: Self-pay

## 2019-06-04 ENCOUNTER — Encounter: Payer: Self-pay | Admitting: Family Medicine

## 2019-06-04 NOTE — Telephone Encounter (Signed)
Spoke with pt notifying her the Cologuard results are negative and she can repeat it in 3 yrs.  Verbalizes understanding.

## 2019-07-03 ENCOUNTER — Other Ambulatory Visit: Payer: Self-pay | Admitting: Family Medicine

## 2019-07-04 NOTE — Telephone Encounter (Signed)
Ibuprofen  Last filled:  02/16/19, #30 Last OV:  04/09/19, acute right leg pain Next OV:  09/27/19, AWV prt 2

## 2019-07-10 NOTE — Progress Notes (Signed)
I have reviewed and agreed above plan.  If she continues to do well, may continue follow-up with his primary care physician

## 2019-07-29 ENCOUNTER — Other Ambulatory Visit: Payer: Self-pay | Admitting: Family Medicine

## 2019-07-30 NOTE — Telephone Encounter (Signed)
Ibuprofen Last filled:  07/19/19, #30 Last OV:  04/09/19, busitis Next OV:  09/27/19, AWV prt 2

## 2019-08-15 ENCOUNTER — Other Ambulatory Visit: Payer: Self-pay | Admitting: Family Medicine

## 2019-08-15 DIAGNOSIS — L282 Other prurigo: Secondary | ICD-10-CM

## 2019-08-16 NOTE — Telephone Encounter (Signed)
Kenalog cream Last filled:  04/09/19, #30 g Last OV:  04/09/19, acute bursitis, rash Next OV:  09/27/19, AWV prt 2

## 2019-08-28 ENCOUNTER — Telehealth: Payer: Self-pay | Admitting: Neurology

## 2019-08-28 MED ORDER — TOPIRAMATE 25 MG PO TABS: 75.0000 mg | ORAL_TABLET | Freq: Every day | ORAL | 0 refills | Status: DC

## 2019-08-28 NOTE — Telephone Encounter (Signed)
Yes, we can prescribe her Topamax 75 mg at bedtime. You can send in for local and mail order, whichever she needs or both.

## 2019-08-28 NOTE — Telephone Encounter (Signed)
Noted. I sent a 2 week supply of Topiramate 25 mg tablet (directions 75 mg QHS) to [facility] and a 90 day supply to [facility]. I called pt and let her know. She verbalized appreciation.

## 2019-08-28 NOTE — Addendum Note (Signed)
Addended by: Gildardo Griffes on: 08/28/2019 12:58 PM   Modules accepted: Orders

## 2019-08-28 NOTE — Telephone Encounter (Signed)
Pt called stating she is out of her topiramate (TOPAMAX) 25 MG tablet and she is wanting to know if at least a 10 day supply can be called in for her at the [facility] [place] since they are about to leave for a trip but call her 90 day supply to the [place] mail service. Please advise.

## 2019-09-16 ENCOUNTER — Ambulatory Visit: Payer: Medicare HMO | Admitting: Family Medicine

## 2019-09-20 ENCOUNTER — Other Ambulatory Visit: Payer: Medicare HMO

## 2019-09-20 ENCOUNTER — Other Ambulatory Visit: Payer: Self-pay | Admitting: Family Medicine

## 2019-09-20 ENCOUNTER — Ambulatory Visit (INDEPENDENT_AMBULATORY_CARE_PROVIDER_SITE_OTHER): Payer: Medicare HMO

## 2019-09-20 DIAGNOSIS — Z0000 Encounter for general adult medical examination without abnormal findings: Secondary | ICD-10-CM | POA: Diagnosis not present

## 2019-09-20 NOTE — Patient Instructions (Signed)
[name] , Thank you for taking time to come for your Medicare Wellness Visit. I appreciate your ongoing commitment to your health goals. Please review the following plan we discussed and let me know if I can assist you in the future.   Screening recommendations/referrals: Colonoscopy: Cologuard completed 05/28/2019 Mammogram: Up to date, completed 05/06/2019 Bone Density: completed 05/07/2014 Recommended yearly ophthalmology/optometry visit for glaucoma screening and checkup Recommended yearly dental visit for hygiene and checkup  Vaccinations: Influenza vaccine: Up to date, completed 01/23/2019 Pneumococcal vaccine: Completed series Tdap vaccine: decline Shingles vaccine: Completed series    Advanced directives: copy in chart   Conditions/risks identified: diabetes, hyperlipidemia  Next appointment: 10/04/2019 @ 2 pm    Preventive Care 50 Years and Older, Female Preventive care refers to lifestyle choices and visits with your health care provider that can promote health and wellness. What does preventive care include?  A yearly physical exam. This is also called an annual well check.  Dental exams once or twice a year.  Routine eye exams. Ask your health care provider how often you should have your eyes checked.  Personal lifestyle choices, including:  Daily care of your teeth and gums.  Regular physical activity.  Eating a healthy diet.  Avoiding tobacco and drug use.  Limiting alcohol use.  Practicing safe sex.  Taking low-dose aspirin every day.  Taking vitamin and mineral supplements as recommended by your health care provider. What happens during an annual well check? The services and screenings done by your health care provider during your annual well check will depend on your age, overall health, lifestyle risk factors, and family history of disease. Counseling  Your health care provider may ask you questions about your:  Alcohol use.  Tobacco  use.  Drug use.  Emotional well-being.  Home and relationship well-being.  Sexual activity.  Eating habits.  History of falls.  Memory and ability to understand (cognition).  Work and work Statistician.  Reproductive health. Screening  You may have the following tests or measurements:  Height, weight, and BMI.  Blood pressure.  Lipid and cholesterol levels. These may be checked every 5 years, or more frequently if you are over 53 years old.  Skin check.  Lung cancer screening. You may have this screening every year starting at age 35 if you have a 30-pack-year history of smoking and currently smoke or have quit within the past 15 years.  Fecal occult blood test (FOBT) of the stool. You may have this test every year starting at age 15.  Flexible sigmoidoscopy or colonoscopy. You may have a sigmoidoscopy every 5 years or a colonoscopy every 10 years starting at age 55.  Hepatitis C blood test.  Hepatitis B blood test.  Sexually transmitted disease (STD) testing.  Diabetes screening. This is done by checking your blood sugar (glucose) after you have not eaten for a while (fasting). You may have this done every 1-3 years.  Bone density scan. This is done to screen for osteoporosis. You may have this done starting at age 102.  Mammogram. This may be done every 1-2 years. Talk to your health care provider about how often you should have regular mammograms. Talk with your health care provider about your test results, treatment options, and if necessary, the need for more tests. Vaccines  Your health care provider may recommend certain vaccines, such as:  Influenza vaccine. This is recommended every year.  Tetanus, diphtheria, and acellular pertussis (Tdap, Td) vaccine. You may need a Td  booster every 10 years.  Zoster vaccine. You may need this after age 80.  Pneumococcal 13-valent conjugate (PCV13) vaccine. One dose is recommended after age 18.  Pneumococcal  polysaccharide (PPSV23) vaccine. One dose is recommended after age 88. Talk to your health care provider about which screenings and vaccines you need and how often you need them. This information is not intended to replace advice given to you by your health care provider. Make sure you discuss any questions you have with your health care provider. Document Released: 05/22/2015 Document Revised: 01/13/2016 Document Reviewed: 02/24/2015 Elsevier Interactive Patient Education  2017 [place] Prevention in the Home Falls can cause injuries. They can happen to people of all ages. There are many things you can do to make your home safe and to help prevent falls. What can I do on the outside of my home?  Regularly fix the edges of walkways and driveways and fix any cracks.  Remove anything that might make you trip as you walk through a door, such as a raised step or threshold.  Trim any bushes or trees on the path to your home.  Use bright outdoor lighting.  Clear any walking paths of anything that might make someone trip, such as rocks or tools.  Regularly check to see if handrails are loose or broken. Make sure that both sides of any steps have handrails.  Any raised decks and porches should have guardrails on the edges.  Have any leaves, snow, or ice cleared regularly.  Use sand or salt on walking paths during winter.  Clean up any spills in your garage right away. This includes oil or grease spills. What can I do in the bathroom?  Use night lights.  Install grab bars by the toilet and in the tub and shower. Do not use towel bars as grab bars.  Use non-skid mats or decals in the tub or shower.  If you need to sit down in the shower, use a plastic, non-slip stool.  Keep the floor dry. Clean up any water that spills on the floor as soon as it happens.  Remove soap buildup in the tub or shower regularly.  Attach bath mats securely with double-sided non-slip rug  tape.  Do not have throw rugs and other things on the floor that can make you trip. What can I do in the bedroom?  Use night lights.  Make sure that you have a light by your bed that is easy to reach.  Do not use any sheets or blankets that are too big for your bed. They should not hang down onto the floor.  Have a firm chair that has side arms. You can use this for support while you get dressed.  Do not have throw rugs and other things on the floor that can make you trip. What can I do in the kitchen?  Clean up any spills right away.  Avoid walking on wet floors.  Keep items that you use a lot in easy-to-reach places.  If you need to reach something above you, use a strong step stool that has a grab bar.  Keep electrical cords out of the way.  Do not use floor polish or wax that makes floors slippery. If you must use wax, use non-skid floor wax.  Do not have throw rugs and other things on the floor that can make you trip. What can I do with my stairs?  Do not leave any items on the stairs.  Make sure that there are handrails on both sides of the stairs and use them. Fix handrails that are broken or loose. Make sure that handrails are as long as the stairways.  Check any carpeting to make sure that it is firmly attached to the stairs. Fix any carpet that is loose or worn.  Avoid having throw rugs at the top or bottom of the stairs. If you do have throw rugs, attach them to the floor with carpet tape.  Make sure that you have a light switch at the top of the stairs and the bottom of the stairs. If you do not have them, ask someone to add them for you. What else can I do to help prevent falls?  Wear shoes that:  Do not have high heels.  Have rubber bottoms.  Are comfortable and fit you well.  Are closed at the toe. Do not wear sandals.  If you use a stepladder:  Make sure that it is fully opened. Do not climb a closed stepladder.  Make sure that both sides of the  stepladder are locked into place.  Ask someone to hold it for you, if possible.  Clearly mark and make sure that you can see:  Any grab bars or handrails.  First and last steps.  Where the edge of each step is.  Use tools that help you move around (mobility aids) if they are needed. These include:  Canes.  Walkers.  Scooters.  Crutches.  Turn on the lights when you go into a dark area. Replace any light bulbs as soon as they burn out.  Set up your furniture so you have a clear path. Avoid moving your furniture around.  If any of your floors are uneven, fix them.  If there are any pets around you, be aware of where they are.  Review your medicines with your doctor. Some medicines can make you feel dizzy. This can increase your chance of falling. Ask your doctor what other things that you can do to help prevent falls. This information is not intended to replace advice given to you by your health care provider. Make sure you discuss any questions you have with your health care provider. Document Released: 02/19/2009 Document Revised: 10/01/2015 Document Reviewed: 05/30/2014 Elsevier Interactive Patient Education  2017 [employer].

## 2019-09-20 NOTE — Progress Notes (Signed)
PCP notes:  Health Maintenance: Tdap- insurance/finanical    Abnormal Screenings: none   Patient concerns: none   Nurse concerns: none   Next PCP appt: 10/04/2019 @ 2 pm

## 2019-09-20 NOTE — Progress Notes (Signed)
Subjective:   Dana [name] is a 74 y.o. female who presents for Medicare Annual (Subsequent) preventive examination.  Review of Systems: N/A   I connected with the patient today by telephone and verified that I am speaking with the correct person using two identifiers. Location patient: home Location provider: work Persons participating in the virtual visit: patient, provider.   I discussed the limitations, risks, security and privacy concerns of performing an evaluation and management service by telephone and the availability of in person appointments. I also discussed with the patient that there may be a patient responsible charge related to this service. The patient expressed understanding and verbally consented to this telephonic visit.    Interactive audio and video telecommunications were attempted between this provider and patient, however failed, due to patient having technical difficulties OR patient did not have access to video capability.  We continued and completed visit with audio only.     Time Spent with patient on telephone encounter: 35 minutes  Cardiac Risk Factors include: advanced age (>63men, >81 women);diabetes mellitus;female gender;dyslipidemia     Objective:     Vitals: There were no vitals taken for this visit.  There is no height or weight on file to calculate BMI.  Advanced Directives 09/20/2019 09/19/2018 07/25/2017 05/19/2017 05/03/2017 08/17/2015 11/20/2014  Does Patient Have a Medical Advance Directive? Yes Yes Yes Yes Yes No No  Type of Paramedic of [place];Living will [place];Living will Living will;Healthcare Power of Attorney Living will [place];Living will - -  Does patient want to make changes to medical advance directive? - No - Patient declined No - Patient declined No - Patient declined - - -  Copy of [place] in Chart? Yes - validated most recent copy scanned in  chart (See row information) Yes - validated most recent copy scanned in chart (See row information) - - Yes - -  Would patient like information on creating a medical advance directive? - - - - - Yes - Educational materials given -    Tobacco Social History   Tobacco Use  Smoking Status Never Smoker  Smokeless Tobacco Never Used     Counseling given: Not Answered   Clinical Intake:  Pre-visit preparation completed: Yes  Pain : No/denies pain     Nutritional Risks: None Diabetes: Yes CBG done?: No Did pt. bring in CBG monitor from home?: No  How often do you need to have someone help you when you read instructions, pamphlets, or other written materials from your doctor or pharmacy?: 1 - Never What is the last grade level you completed in school?: 12th  Interpreter Needed?: No  Information entered by :: CJohnson, LPN  Past Medical History:  Diagnosis Date  . Allergy   . Anemia   . Arthritis   . Cataract   . Controlled type 2 diabetes mellitus without complication, without long-term current use of insulin ([place])   . Diverticulitis   . History of chicken pox   . Hyperlipidemia   . Kidney stone   . 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 Migraine   . Urge incontinence 04/29/2014   Past Surgical History:  Procedure Laterality Date  . ABDOMINAL HYSTERECTOMY  2006   cervix remains, menorrhagia  . APPENDECTOMY  1970  . [place]   biopsy-left  . BREAST SURGERY  1995   right milk duct removed  . CARPAL TUNNEL RELEASE Right 02/2017  .  CHOLECYSTECTOMY  1971  . COLONOSCOPY  11/2005   diverticulosis o/w WNL [facility])  . DEXA  2015   spine -0.4, hip -1.0 WNL  . LITHOTRIPSY    . LUMBAR EPIDURAL INJECTION Left 10/2017   transforaminal L L3/4 (Dr Maryjean Ka)  . TUBAL LIGATION     Family History  Problem Relation Age of Onset  . Heart disease Mother 82  . Hyperlipidemia Mother   . Hyperlipidemia Father   . Stroke Father   . Heart disease Father   . Diabetes  Father   . Fibromyalgia Sister   . Hyperlipidemia Sister   . Heart disease Brother   . Diabetes Brother    Social History   Socioeconomic History  . Marital status: Married    Spouse name: [name]  . Number of children: 1  . Years of education: 1  . Highest education level: Not on file  Occupational History  . Occupation: Retired    Comment: Retired  Immunologist  . Smoking status: Never Smoker  . Smokeless tobacco: Never Used  Substance and Sexual Activity  . Alcohol use: No    Alcohol/week: 0.0 standard drinks  . Drug use: No  . Sexual activity: Not Currently  Other Topics Concern  . Not on file  Social History Narrative   Patient is married and lives at home with her husband [name]). Patient is retired.    Edu: highschool education.   Right handed.   Exercise 3 times a week.   Diet: good water, fruits/vegetables daily   Caffeine: None      HAs living will, HCPOA, full code   Social Determinants of Health   Financial Resource Strain: Low Risk   . Difficulty of Paying Living Expenses: Not hard at all  Food Insecurity: No Food Insecurity  . Worried About Charity fundraiser in the Last Year: Never true  . Ran Out of Food in the Last Year: Never true  Transportation Needs: No Transportation Needs  . Lack of Transportation (Medical): No  . Lack of Transportation (Non-Medical): No  Physical Activity: Insufficiently Active  . Days of Exercise per Week: 7 days  . Minutes of Exercise per Session: 20 min  Stress: No Stress Concern Present  . Feeling of Stress : Not at all  Social Connections:   . Frequency of Communication with Friends and Family:   . Frequency of Social Gatherings with Friends and Family:   . Attends Religious Services:   . Active Member of Clubs or Organizations:   . Attends Archivist Meetings:   Marland Kitchen Marital Status:     Outpatient Encounter Medications as of 09/20/2019  Medication Sig  . acetaminophen (TYLENOL) 325 MG tablet Take 650  mg by mouth every 6 (six) hours as needed for mild pain.   Marland Kitchen aspirin 81 MG tablet Take 81 mg by mouth daily.  Marland Kitchen atorvastatin (LIPITOR) 40 MG tablet Take 1 tablet (40 mg total) by mouth daily.  . butalbital-acetaminophen-caffeine (FIORICET, ESGIC) 50-325-40 MG tablet Take 1 tablet by mouth every 6 (six) hours as needed for headache.  . co-enzyme Q-10 30 MG capsule Take 30 mg by mouth daily.   . fluticasone (FLONASE) 50 MCG/ACT nasal spray USE 2 SPRAYS IN EACH NOSTRIL EVERY DAY  . furosemide (LASIX) 20 MG tablet TAKE 1 TABLET BY MOUTH DAILY AS NEEDED FOR FLUID.  . IBU 600 MG tablet TAKE 1 TABLET TWICE DAILY AS NEEDED FOR  MIGRAINE  HEADACHE  . methocarbamol (ROBAXIN) 500 MG  tablet Take 1 tablet (500 mg total) by mouth 3 (three) times daily as needed for muscle spasms (sedation precautions).  . Multiple Vitamin (MULTIVITAMIN) tablet Take 1 tablet by mouth daily.  Marland Kitchen omeprazole (PRILOSEC) 20 MG capsule TAKE 1 CAPSULE EVERY DAY  . ondansetron (ZOFRAN ODT) 4 MG disintegrating tablet Take 1 tablet (4 mg total) by mouth every 8 (eight) hours as needed.  . topiramate (TOPAMAX) 25 MG tablet Take 3 tablets (75 mg total) by mouth at bedtime.  . triamcinolone cream (KENALOG) 0.1 % APPLY TO AFFECTED AREA TWICE A DAY  . vitamin B-12 (CYANOCOBALAMIN) 1000 MCG tablet Take 1 tablet (1,000 mcg total) by mouth every Monday, Wednesday, and Friday.   No facility-administered encounter medications on file as of 09/20/2019.    Activities of Daily Living In your present state of health, do you have any difficulty performing the following activities: 09/20/2019  Hearing? N  Vision? N  Difficulty concentrating or making decisions? N  Walking or climbing stairs? N  Dressing or bathing? N  Doing errands, shopping? N  Preparing Food and eating ? N  Using the Toilet? N  In the past six months, have you accidently leaked urine? N  Do you have problems with loss of bowel control? N  Managing your Medications? N    Managing your Finances? N  Housekeeping or managing your Housekeeping? N  Some recent data might be hidden    Patient Care Team: [name], MD as PCP - General (Family Medicine)    Assessment:   This is a routine wellness examination for Adiana.  Exercise Activities and Dietary recommendations Current Exercise Habits: Home exercise routine, Type of exercise: walking, Time (Minutes): 20, Frequency (Times/Week): 7, Weekly Exercise (Minutes/Week): 140, Intensity: Moderate, Exercise limited by: None identified  Goals    . Patient Stated     Starting 09/19/2018, I will continue to take medications as prescribed.     . Patient Stated     09/20/2019, I will continue to walk 1,000-2,000 steps everyday.       Fall Risk Fall Risk  09/20/2019 09/19/2018 07/25/2017 05/03/2017 05/03/2016  Falls in the past year? 0 0 No No No  Number falls in past yr: 0 - - - -  Injury with Fall? 0 - - - -  Risk for fall due to : Medication side effect - - - -  Follow up Falls evaluation completed;Falls prevention discussed - - - -   Is the patient's home free of loose throw rugs in walkways, pet beds, electrical cords, etc?   yes      Grab bars in the bathroom? no      Handrails on the stairs?   yes      Adequate lighting?   yes  Timed Get Up and Go performed: N/A  Depression Screen PHQ 2/9 Scores 09/20/2019 09/19/2018 09/08/2017 07/25/2017  PHQ - 2 Score 0 0 0 0  PHQ- 9 Score 0 0 0 -     Cognitive Function MMSE - Mini Mental State Exam 09/20/2019 09/19/2018 05/03/2017  Orientation to time 5 5 5   Orientation to Place 5 5 5   Registration 3 3 3   Attention/ Calculation 5 0 0  Recall 3 3 3   Language- name 2 objects - 0 0  Language- repeat 1 1 1   Language- follow 3 step command - 0 3  Language- read & follow direction - 0 0  Write a sentence - 0 0  Copy design - 0 0  Total score - 17 20  Mini Cog  Mini-Cog screen was completed. Maximum score is 22. A value of 0 denotes this part of the MMSE  was not completed or the patient failed this part of the Mini-Cog screening.       Immunization History  Administered Date(s) Administered  . Fluad Quad(high Dose 65+) 01/23/2019  . Influenza, High Dose Seasonal PF 01/28/2015  . Influenza,inj,Quad PF,6+ Mos 01/09/2013, 01/22/2016, 02/01/2017, 02/15/2018  . Influenza-Unspecified 02/18/2014, 02/07/2016, 02/06/2017  . Moderna SARS-COVID-2 Vaccination 05/31/2019  . Pneumococcal Conjugate-13 04/29/2014  . Pneumococcal Polysaccharide-23 05/03/2016  . Pneumococcal-Unspecified 05/09/2010  . Zoster 05/09/2008  . Zoster Recombinat (Shingrix) 02/09/2018, 05/22/2018    Qualifies for Shingles Vaccine: Completed series  Screening Tests Health Maintenance  Topic Date Due  . DTAP VACCINES (1) Never done  . COVID-19 Vaccine (2 - Moderna 2-dose series) 06/28/2019  . HEMOGLOBIN A1C  07/23/2019  . URINE MICROALBUMIN  09/20/2019  . DTaP/Tdap/Td (1 - Tdap) 05/08/2020 (Originally 01/24/1965)  . TETANUS/TDAP  05/08/2020 (Originally 01/24/1965)  . INFLUENZA VACCINE  12/08/2019  . FOOT EXAM  01/23/2020  . OPHTHALMOLOGY EXAM  04/10/2020  . MAMMOGRAM  05/05/2020  . Fecal DNA (Cologuard)  05/27/2022  . DEXA SCAN  Completed  . Hepatitis C Screening  Completed  . PNA vac Low Risk Adult  Completed    Cancer Screenings: Lung: Low Dose CT Chest recommended if Age 47-80 years, 30 pack-year currently smoking OR have quit w/in 15 years. Patient does not qualify. Breast:  Up to date on Mammogram: Yes, completed 05/06/2019   Bone Density/Dexa: completed 05/07/2014 Colorectal: Cologuard completed 05/28/2019  Additional Screenings:  Hepatitis C Screening: 05/01/2015     Plan:   Patient will continue to walk 1,000-2,000 steps everyday.    I have personally reviewed and noted the following in the patient's chart:   . Medical and social history . Use of alcohol, tobacco or illicit drugs  . Current medications and supplements . Functional ability and  status . Nutritional status . Physical activity . Advanced directives . List of other physicians . Hospitalizations, surgeries, and ER visits in previous 12 months . Vitals . Screenings to include cognitive, depression, and falls . Referrals and appointments  In addition, I have reviewed and discussed with patient certain preventive protocols, quality metrics, and best practice recommendations. A written personalized care plan for preventive services as well as general preventive health recommendations were provided to patient.     Andrez Grime, LPN  [id number]

## 2019-09-26 ENCOUNTER — Other Ambulatory Visit: Payer: Self-pay | Admitting: Family Medicine

## 2019-09-26 DIAGNOSIS — G609 Hereditary and idiopathic neuropathy, unspecified: Secondary | ICD-10-CM

## 2019-09-26 DIAGNOSIS — E785 Hyperlipidemia, unspecified: Secondary | ICD-10-CM

## 2019-09-26 DIAGNOSIS — E119 Type 2 diabetes mellitus without complications: Secondary | ICD-10-CM

## 2019-09-27 ENCOUNTER — Encounter: Payer: Medicare HMO | Admitting: Family Medicine

## 2019-10-01 ENCOUNTER — Other Ambulatory Visit (INDEPENDENT_AMBULATORY_CARE_PROVIDER_SITE_OTHER): Payer: Medicare HMO

## 2019-10-01 DIAGNOSIS — E119 Type 2 diabetes mellitus without complications: Secondary | ICD-10-CM

## 2019-10-01 DIAGNOSIS — G609 Hereditary and idiopathic neuropathy, unspecified: Secondary | ICD-10-CM | POA: Diagnosis not present

## 2019-10-01 DIAGNOSIS — E785 Hyperlipidemia, unspecified: Secondary | ICD-10-CM | POA: Diagnosis not present

## 2019-10-01 LAB — COMPREHENSIVE METABOLIC PANEL
ALT: 20 U/L (ref 0–35)
AST: 15 U/L (ref 0–37)
Albumin: 4.2 g/dL (ref 3.5–5.2)
Alkaline Phosphatase: 76 U/L (ref 39–117)
BUN: 17 mg/dL (ref 6–23)
CO2: 27 mEq/L (ref 19–32)
Calcium: 9.2 mg/dL (ref 8.4–10.5)
Chloride: 110 mEq/L (ref 96–112)
Creatinine, Ser: 0.71 mg/dL (ref 0.40–1.20)
GFR: 80.76 mL/min (ref >60.00)
Glucose, Bld: 113 mg/dL — ABNORMAL HIGH (ref 70–99)
Potassium: 3.8 mEq/L (ref 3.5–5.1)
Sodium: 142 mEq/L (ref 135–145)
Total Bilirubin: 0.6 mg/dL (ref 0.2–1.2)
Total Protein: 7.1 g/dL (ref 6.0–8.3)

## 2019-10-01 LAB — LIPID PANEL
Cholesterol: 143 mg/dL (ref 0–200)
HDL: 46.8 mg/dL (ref >39.00)
LDL Cholesterol: 77 mg/dL (ref 0–99)
NonHDL: 95.9
Total CHOL/HDL Ratio: 3
Triglycerides: 97 mg/dL (ref 0.0–149.0)
VLDL: 19.4 mg/dL (ref 0.0–40.0)

## 2019-10-01 LAB — MICROALBUMIN / CREATININE URINE RATIO
Creatinine,U: 75.9 mg/dL
Microalb Creat Ratio: 4.1 mg/g (ref 0.0–30.0)
Microalb, Ur: 3.1 mg/dL — ABNORMAL HIGH (ref 0.0–1.9)

## 2019-10-01 LAB — HEMOGLOBIN A1C: Hgb A1c MFr Bld: 6.5 % (ref 4.6–6.5)

## 2019-10-01 LAB — VITAMIN B12: Vitamin B-12: 817 pg/mL (ref 211–911)

## 2019-10-04 ENCOUNTER — Encounter: Payer: Self-pay | Admitting: Family Medicine

## 2019-10-04 ENCOUNTER — Other Ambulatory Visit: Payer: Self-pay

## 2019-10-04 ENCOUNTER — Ambulatory Visit (INDEPENDENT_AMBULATORY_CARE_PROVIDER_SITE_OTHER): Payer: Medicare HMO | Admitting: Family Medicine

## 2019-10-04 VITALS — BP 122/66 | HR 72 | Temp 98.1°F | Ht 62.5 in | Wt 171.1 lb

## 2019-10-04 DIAGNOSIS — K219 Gastro-esophageal reflux disease without esophagitis: Secondary | ICD-10-CM | POA: Diagnosis not present

## 2019-10-04 DIAGNOSIS — Z87442 Personal history of urinary calculi: Secondary | ICD-10-CM | POA: Diagnosis not present

## 2019-10-04 DIAGNOSIS — E1169 Type 2 diabetes mellitus with other specified complication: Secondary | ICD-10-CM

## 2019-10-04 DIAGNOSIS — E785 Hyperlipidemia, unspecified: Secondary | ICD-10-CM

## 2019-10-04 DIAGNOSIS — E669 Obesity, unspecified: Secondary | ICD-10-CM | POA: Diagnosis not present

## 2019-10-04 DIAGNOSIS — E119 Type 2 diabetes mellitus without complications: Secondary | ICD-10-CM | POA: Diagnosis not present

## 2019-10-04 DIAGNOSIS — G43109 Migraine with aura, not intractable, without status migrainosus: Secondary | ICD-10-CM

## 2019-10-04 DIAGNOSIS — Z0000 Encounter for general adult medical examination without abnormal findings: Secondary | ICD-10-CM

## 2019-10-04 DIAGNOSIS — R87622 Low grade squamous intraepithelial lesion on cytologic smear of vagina (LGSIL): Secondary | ICD-10-CM | POA: Diagnosis not present

## 2019-10-04 MED ORDER — OMEPRAZOLE 20 MG PO CPDR: 20.0000 mg | DELAYED_RELEASE_CAPSULE | Freq: Every day | ORAL | 3 refills | Status: DC

## 2019-10-04 MED ORDER — ATORVASTATIN CALCIUM 40 MG PO TABS: 40.0000 mg | ORAL_TABLET | Freq: Every day | ORAL | 3 refills | Status: DC

## 2019-10-04 NOTE — Patient Instructions (Signed)
You are doing well today Continue current medicines Return as needed or in 6 months for diabetes follow up visit.   Health Maintenance After Age 73 After age 66, you are at a higher risk for certain long-term diseases and infections as well as injuries from falls. Falls are a major cause of broken bones and head injuries in people who are older than age 50. Getting regular preventive care can help to keep you healthy and well. Preventive care includes getting regular testing and making lifestyle changes as recommended by your health care provider. Talk with your health care provider about:  Which screenings and tests you should have. A screening is a test that checks for a disease when you have no symptoms.  A diet and exercise plan that is right for you. What should I know about screenings and tests to prevent falls? Screening and testing are the best ways to find a health problem early. Early diagnosis and treatment give you the best chance of managing medical conditions that are common after age 38. Certain conditions and lifestyle choices may make you more likely to have a fall. Your health care provider may recommend:  Regular vision checks. Poor vision and conditions such as cataracts can make you more likely to have a fall. If you wear glasses, make sure to get your prescription updated if your vision changes.  Medicine review. Work with your health care provider to regularly review all of the medicines you are taking, including over-the-counter medicines. Ask your health care provider about any side effects that may make you more likely to have a fall. Tell your health care provider if any medicines that you take make you feel dizzy or sleepy.  Osteoporosis screening. Osteoporosis is a condition that causes the bones to get weaker. This can make the bones weak and cause them to break more easily.  Blood pressure screening. Blood pressure changes and medicines to control blood pressure can  make you feel dizzy.  Strength and balance checks. Your health care provider may recommend certain tests to check your strength and balance while standing, walking, or changing positions.  Foot health exam. Foot pain and numbness, as well as not wearing proper footwear, can make you more likely to have a fall.  Depression screening. You may be more likely to have a fall if you have a fear of falling, feel emotionally low, or feel unable to do activities that you used to do.  Alcohol use screening. Using too much alcohol can affect your balance and may make you more likely to have a fall. What actions can I take to lower my risk of falls? General instructions  Talk with your health care provider about your risks for falling. Tell your health care provider if: ? You fall. Be sure to tell your health care provider about all falls, even ones that seem minor. ? You feel dizzy, sleepy, or off-balance.  Take over-the-counter and prescription medicines only as told by your health care provider. These include any supplements.  Eat a healthy diet and maintain a healthy weight. A healthy diet includes low-fat dairy products, low-fat (lean) meats, and fiber from whole grains, beans, and lots of fruits and vegetables. Home safety  Remove any tripping hazards, such as rugs, cords, and clutter.  Install safety equipment such as grab bars in bathrooms and safety rails on stairs.  Keep rooms and walkways well-lit. Activity   Follow a regular exercise program to stay fit. This will help you maintain  your balance. Ask your health care provider what types of exercise are appropriate for you.  If you need a cane or walker, use it as recommended by your health care provider.  Wear supportive shoes that have nonskid soles. Lifestyle  Do not drink alcohol if your health care provider tells you not to drink.  If you drink alcohol, limit how much you have: ? 0-1 drink a day for women. ? 0-2 drinks a  day for men.  Be aware of how much alcohol is in your drink. In the [place] one drink equals one typical bottle of beer (12 oz), one-half glass of wine (5 oz), or one shot of hard liquor (1 oz).  Do not use any products that contain nicotine or tobacco, such as cigarettes and e-cigarettes. If you need help quitting, ask your health care provider. Summary  Having a healthy lifestyle and getting preventive care can help to protect your health and wellness after age 27.  Screening and testing are the best way to find a health problem early and help you avoid having a fall. Early diagnosis and treatment give you the best chance for managing medical conditions that are more common for people who are older than age 28.  Falls are a major cause of broken bones and head injuries in people who are older than age 85. Take precautions to prevent a fall at home.  Work with your health care provider to learn what changes you can make to improve your health and wellness and to prevent falls. This information is not intended to replace advice given to you by your health care provider. Make sure you discuss any questions you have with your health care provider. Document Revised: 08/16/2018 Document Reviewed: 03/08/2017 Elsevier Patient Education  2020 [employer].

## 2019-10-04 NOTE — Assessment & Plan Note (Signed)
Chronic, stable off medication.  

## 2019-10-04 NOTE — Assessment & Plan Note (Signed)
Chronic, stable on omeprazole 20mg daily.  

## 2019-10-04 NOTE — Assessment & Plan Note (Signed)
Chronic stable on statin. Continue. The 10-year ASCVD risk score Mikey Bussing DC Brooke Bonito., et al., 2013) is: 18.8%   Values used to calculate the score:     Age: 74 years     Sex: Female     Is Non-Hispanic African American: No     Diabetic: Yes     Tobacco smoker: No     Systolic Blood Pressure: 123XX123 mmHg     Is BP treated: No     HDL Cholesterol: 46.8 mg/dL     Total Cholesterol: 143 mg/dL

## 2019-10-04 NOTE — Assessment & Plan Note (Signed)
Sees GYN Q2 yrs - states next visit due next year.

## 2019-10-04 NOTE — Assessment & Plan Note (Signed)
Continues topamax 75mg  daily through neurology.

## 2019-10-04 NOTE — Progress Notes (Signed)
This visit was conducted in person.  BP 122/66 (BP Location: Left Arm, Patient Position: Sitting, Cuff Size: Normal)   Pulse 72   Temp 98.1 F (36.7 C) (Temporal)   Ht 5' 2.5" (1.588 m)   Wt 171 lb 1 oz (77.6 kg)   SpO2 97%   BMI 30.79 kg/m    CC: CPE Subjective:    Patient ID: Dana [name], female    DOB: 1946/02/09, 74 y.o.   MRN: [id number]  HPI: Dana [name] is a 74 y.o. female presenting on 10/04/2019 for Annual Exam (Prt 2. )   BIL passed away from lung disease ([place]). Cousin passed away unexpectedly (MVA).   Saw health advisor 2 wks ago for medicare wellness visit. Note reviewed.   No exam data present    Clinical Support from 09/20/2019 in [place] at [facility] Total Score  0      Fall Risk  09/20/2019 09/19/2018 07/25/2017 05/03/2017 05/03/2016  Falls in the past year? 0 0 No No No  Number falls in past yr: 0 - - - -  Injury with Fall? 0 - - - -  Risk for fall due to : Medication side effect - - - -  Follow up Falls evaluation completed;Falls prevention discussed - - - -    Recent trip to [place] upcoming trip to [facility] for 2 wks to visit family.   Preventative: COLONOSCOPY 11/2005;diverticulosis o/w WNL Mikel Cella). cologuard WNL 2017, 2021 Well woman examwith GYN Dr Rogue Bussing. Sees every q30yrs, last 10/2018. Normal pap smears.  Breast cancer screening -3D mammogram 04/2019 at [place] screening -not eligible DEXA 2015 spine -0.4, hip -1.0 WNL  Flu shot -yearly Td unsure COVID vaccine - completed Moderna 06/2019 Pneumovax 2012, prevnar 2015, pneumovax2017 zostavax-2010. h/o shingles x2 shingrix - 02/2018, 05/2018 Advanced directive discussion - durable POA is husband [name] - no HCPOA set up. Has living will. Full code. Does not want prolonged life support by extraordinary means. Scanned into chart 09/2016. Seat belt use discussed  Sunscreen use and skin screen discussed Non smoker  Alcohol - rare   Dentist - Q6 mo Eye exam yearly  Bowels - no constipation Bladder - no incontinence  Patient is married and lives at home with her husband [name]). Patient is retired. [id number] education.  Right handed.  Exercise 3 times a week.  Diet: good water, fruits/vegetables daily - joined weight watchers but no longer.  Caffeine:None      Relevant past medical, surgical, family and social history reviewed and updated as indicated. Interim medical history since our last visit reviewed. Allergies and medications reviewed and updated. Outpatient Medications Prior to Visit  Medication Sig Dispense Refill  . acetaminophen (TYLENOL) 325 MG tablet Take 650 mg by mouth every 6 (six) hours as needed for mild pain.     Marland Kitchen aspirin 81 MG tablet Take 81 mg by mouth daily.    . butalbital-acetaminophen-caffeine (FIORICET, ESGIC) 50-325-40 MG tablet Take 1 tablet by mouth every 6 (six) hours as needed for headache. 10 tablet 5  . co-enzyme Q-10 30 MG capsule Take 30 mg by mouth daily.     . fluticasone (FLONASE) 50 MCG/ACT nasal spray USE 2 SPRAYS IN EACH NOSTRIL EVERY DAY 48 g 0  . furosemide (LASIX) 20 MG tablet TAKE 1 TABLET BY MOUTH DAILY AS NEEDED FOR FLUID. 30 tablet 1  . IBU 600 MG tablet TAKE 1 TABLET TWICE DAILY AS NEEDED FOR  MIGRAINE  HEADACHE 30 tablet 1  . methocarbamol (ROBAXIN) 500 MG tablet Take 1 tablet (500 mg total) by mouth 3 (three) times daily as needed for muscle spasms (sedation precautions). 60 tablet 1  . Multiple Vitamin (MULTIVITAMIN) tablet Take 1 tablet by mouth daily.    . ondansetron (ZOFRAN ODT) 4 MG disintegrating tablet Take 1 tablet (4 mg total) by mouth every 8 (eight) hours as needed. 20 tablet 6  . topiramate (TOPAMAX) 25 MG tablet Take 3 tablets (75 mg total) by mouth at bedtime. 270 tablet 0  . triamcinolone cream (KENALOG) 0.1 % APPLY TO AFFECTED AREA TWICE A DAY 30 g 0  . vitamin B-12 (CYANOCOBALAMIN) 1000 MCG tablet Take 1 tablet (1,000 mcg total) by mouth  every Monday, Wednesday, and Friday.    Marland Kitchen atorvastatin (LIPITOR) 40 MG tablet Take 1 tablet (40 mg total) by mouth daily. 90 tablet 3  . omeprazole (PRILOSEC) 20 MG capsule TAKE 1 CAPSULE EVERY DAY 90 capsule 0   No facility-administered medications prior to visit.     Per HPI unless specifically indicated in ROS section below Review of Systems  Constitutional: Negative for activity change, appetite change, chills, fatigue, fever and unexpected weight change.  HENT: Negative for hearing loss.   Eyes: Negative for visual disturbance.  Respiratory: Negative for cough, chest tightness, shortness of breath and wheezing.   Cardiovascular: Negative for chest pain, palpitations and leg swelling.  Gastrointestinal: Negative for abdominal distention, abdominal pain, blood in stool, constipation, diarrhea, nausea and vomiting.  Genitourinary: Negative for difficulty urinating and hematuria.  Musculoskeletal: Negative for arthralgias, myalgias and neck pain.  Skin: Negative for rash.  Neurological: Negative for dizziness, seizures, syncope and headaches.  Hematological: Negative for adenopathy. Does not bruise/bleed easily.  Psychiatric/Behavioral: Negative for dysphoric mood. The patient is not nervous/anxious.    Objective:  BP 122/66 (BP Location: Left Arm, Patient Position: Sitting, Cuff Size: Normal)   Pulse 72   Temp 98.1 F (36.7 C) (Temporal)   Ht 5' 2.5" (1.588 m)   Wt 171 lb 1 oz (77.6 kg)   SpO2 97%   BMI 30.79 kg/m   Wt Readings from Last 3 Encounters:  10/04/19 171 lb 1 oz (77.6 kg)  04/09/19 166 lb 6 oz (75.5 kg)  02/25/19 166 lb 7 oz (75.5 kg)      Physical Exam Vitals and nursing note reviewed.  Constitutional:      General: She is not in acute distress.    Appearance: Normal appearance. She is well-developed. She is not ill-appearing.  HENT:     Head: Normocephalic and atraumatic.     Right Ear: Hearing, tympanic membrane, ear canal and external ear normal.      Left Ear: Hearing, tympanic membrane, ear canal and external ear normal.  Eyes:     General: No scleral icterus.    Extraocular Movements: Extraocular movements intact.     Conjunctiva/sclera: Conjunctivae normal.     Pupils: Pupils are equal, round, and reactive to light.  Cardiovascular:     Rate and Rhythm: Normal rate and regular rhythm.     Pulses: Normal pulses.          Radial pulses are 2+ on the right side and 2+ on the left side.     Heart sounds: Normal heart sounds. No murmur.  Pulmonary:     Effort: Pulmonary effort is normal. No respiratory distress.     Breath sounds: Normal breath sounds. No wheezing, rhonchi or rales.  Abdominal:     General: Abdomen is flat. Bowel sounds are normal. There is no distension.     Palpations: Abdomen is soft. There is no mass.     Tenderness: There is no abdominal tenderness. There is no guarding or rebound.     Hernia: No hernia is present.  Musculoskeletal:        General: Normal range of motion.     Cervical back: Normal range of motion and neck supple.     Right lower leg: No edema.     Left lower leg: No edema.  Lymphadenopathy:     Cervical: No cervical adenopathy.  Skin:    General: Skin is warm and dry.     Findings: No rash.  Neurological:     General: No focal deficit present.     Mental Status: She is alert and oriented to person, place, and time.     Comments: CN grossly intact, station and gait intact  Psychiatric:        Mood and Affect: Mood normal.        Behavior: Behavior normal.        Thought Content: Thought content normal.        Judgment: Judgment normal.       Results for orders placed or performed in visit on 10/01/19  Vitamin B12  Result Value Ref Range   Vitamin B-12 817 211 - 911 pg/mL  Microalbumin / creatinine urine ratio  Result Value Ref Range   Microalb, Ur 3.1 (H) 0.0 - 1.9 mg/dL   Creatinine,U 75.9 mg/dL   Microalb Creat Ratio 4.1 0.0 - 30.0 mg/g  Hemoglobin A1c  Result Value Ref  Range   Hgb A1c MFr Bld 6.5 4.6 - 6.5 %  Lipid panel  Result Value Ref Range   Cholesterol 143 0 - 200 mg/dL   Triglycerides 97.0 0.0 - 149.0 mg/dL   HDL 46.80 >39.00 mg/dL   VLDL 19.4 0.0 - 40.0 mg/dL   LDL Cholesterol 77 0 - 99 mg/dL   Total CHOL/HDL Ratio 3    NonHDL 95.90   Comprehensive metabolic panel  Result Value Ref Range   Sodium 142 135 - 145 mEq/L   Potassium 3.8 3.5 - 5.1 mEq/L   Chloride 110 96 - 112 mEq/L   CO2 27 19 - 32 mEq/L   Glucose, Bld 113 (H) 70 - 99 mg/dL   BUN 17 6 - 23 mg/dL   Creatinine, Ser 0.71 0.40 - 1.20 mg/dL   Total Bilirubin 0.6 0.2 - 1.2 mg/dL   Alkaline Phosphatase 76 39 - 117 U/L   AST 15 0 - 37 U/L   ALT 20 0 - 35 U/L   Total Protein 7.1 6.0 - 8.3 g/dL   Albumin 4.2 3.5 - 5.2 g/dL   GFR 80.76 >60.00 mL/min   Calcium 9.2 8.4 - 10.5 mg/dL   Assessment & Plan:  This visit occurred during the SARS-CoV-2 public health emergency.  Safety protocols were in place, including screening questions prior to the visit, additional usage of staff PPE, and extensive cleaning of exam room while observing appropriate contact time as indicated for disinfecting solutions.   Problem List Items Addressed This Visit    Obesity, Class I, BMI 30-34.9    Weight gain noted. Encouraged renewed efforts at regular aerobic exercise.       LGSIL Pap smear of vaginal cuff    Sees GYN Q2 yrs - states next visit due next year.  Hyperlipidemia associated with type 2 diabetes mellitus (HCC)    Chronic stable on statin. Continue. The 10-year ASCVD risk score Mikey Bussing DC Brooke Bonito., et al., 2013) is: 18.8%   Values used to calculate the score:     Age: 58 years     Sex: Female     Is Non-Hispanic African American: No     Diabetic: Yes     Tobacco smoker: No     Systolic Blood Pressure: 123XX123 mmHg     Is BP treated: No     HDL Cholesterol: 46.8 mg/dL     Total Cholesterol: 143 mg/dL       Relevant Medications   atorvastatin (LIPITOR) 40 MG tablet   History of kidney  stones    H/o this. Caution with topamax use.      Health maintenance examination    Preventative protocols reviewed and updated unless pt declined. Discussed healthy diet and lifestyle.       GERD (gastroesophageal reflux disease)    Chronic, stable on omeprazole 20mg  daily.       Relevant Medications   omeprazole (PRILOSEC) 20 MG capsule   Controlled type 2 diabetes mellitus without complication, without long-term current use of insulin (HCC)    Chronic, stable off medication.       Relevant Medications   atorvastatin (LIPITOR) 40 MG tablet   Complicated migraine    Continues topamax 75mg  daily through neurology.       Relevant Medications   atorvastatin (LIPITOR) 40 MG tablet       Meds ordered this encounter  Medications  . atorvastatin (LIPITOR) 40 MG tablet    Sig: Take 1 tablet (40 mg total) by mouth daily.    Dispense:  90 tablet    Refill:  3  . omeprazole (PRILOSEC) 20 MG capsule    Sig: Take 1 capsule (20 mg total) by mouth daily.    Dispense:  90 capsule    Refill:  3   No orders of the defined types were placed in this encounter.   Patient instructions: You are doing well today Continue current medicines Return as needed or in 6 months for diabetes follow up visit.   Follow up plan: Return in about 6 months (around 04/05/2020) for follow up visit.  [name], MD

## 2019-10-04 NOTE — Assessment & Plan Note (Signed)
Preventative protocols reviewed and updated unless pt declined. Discussed healthy diet and lifestyle.  

## 2019-10-04 NOTE — Assessment & Plan Note (Signed)
Weight gain noted. Encouraged renewed efforts at regular aerobic exercise.

## 2019-10-04 NOTE — Assessment & Plan Note (Signed)
H/o this. Caution with topamax use.

## 2019-10-08 ENCOUNTER — Encounter: Payer: Medicare HMO | Admitting: Family Medicine

## 2019-10-23 ENCOUNTER — Other Ambulatory Visit: Payer: Self-pay | Admitting: Neurology

## 2019-10-28 ENCOUNTER — Ambulatory Visit: Payer: Medicare HMO | Admitting: Neurology

## 2019-10-28 ENCOUNTER — Other Ambulatory Visit: Payer: Self-pay

## 2019-10-28 ENCOUNTER — Encounter: Payer: Self-pay | Admitting: Neurology

## 2019-10-28 VITALS — BP 134/64 | HR 65 | Ht 62.0 in | Wt 170.0 lb

## 2019-10-28 DIAGNOSIS — G43109 Migraine with aura, not intractable, without status migrainosus: Secondary | ICD-10-CM | POA: Diagnosis not present

## 2019-10-28 DIAGNOSIS — I679 Cerebrovascular disease, unspecified: Secondary | ICD-10-CM | POA: Diagnosis not present

## 2019-10-28 MED ORDER — TOPIRAMATE 25 MG PO TABS: 75.0000 mg | ORAL_TABLET | Freq: Every day | ORAL | 4 refills | Status: DC

## 2019-10-28 MED ORDER — BUTALBITAL-APAP-CAFFEINE 50-325-40 MG PO TABS: 1.0000 | ORAL_TABLET | Freq: Four times a day (QID) | ORAL | 1 refills | Status: DC | PRN

## 2019-10-28 NOTE — Patient Instructions (Signed)
Continue current medications Take Fioricet as needed for severe headache  See you back in 1 year or sooner if needed

## 2019-10-28 NOTE — Progress Notes (Signed)
PATIENT: Dana [name] DOB: 1946/03/09  REASON FOR VISIT: follow up HISTORY FROM: patient  HISTORY OF PRESENT ILLNESS: Today 10/28/19  HISTORY  04/25/18 [name] our 74 year old female accompanied by her husband, seen in request byher primary care physician Dr.Gutierrez, Javierfor recurrent headaches.  I saw her previously in 2016 for chronic migraine headaches, last visit was on January 14, 2015.  Shereporteda history of migraine since20s, her typical migraine started with visual aura, shadow coming downfromone side of her visual field, lasting 5-10 minutes, then followed by a severe pounding headaches, piercing pain, movementwould exacerbate her headaches, her headache usually lasts for one day.   At time of evaluation in 2016, she complains of increased frequency of headaches, in addition, she reported few episode of headache with associated confusion, language difficulty, longer lasting up to 24 hours,  Shewastaking Relafen as needed for headaches, which seems to work for her,  She lost to follow-up because overall her headache was under good control, only has moderate migraine headachesevery 2 to 3 months, but began to experience more frequent and prolonged headaches since November 2019, in December, she suffered migraine lasting for 5 days, despite multiple home medication treatment, NSAIDs, Excedrin Migraine, Tylenol, without helping her symptoms, also failed to be relieved by emergency treatment, Toradol injection, during the intense headache, she also describes visual distortions, hallucinations, her husband face was distorted, the bladeof the ceiling fan began to move by itself, she was confused, unsteady gait  Previously she was taking amitriptyline complains of confusion slow thinking with the medications,  I personally reviewed MRI of the brain in January 2019, there was no acute abnormality, generalized atrophy, moderate supratentorium small  vessel disease.  CT head in December 2019, there was no acute abnormality  Labs normal BMP, CBC, A1C 6.4  August 29 2018 JO:INOMVEH 100 mg every night, Fioricet, Zofran, Robaxin, Aleve, Benadryl as needed for headache.Taking daily aspirin 81 mg.  She reports she has been doing well, was not able to tolerate Topamax 100 mg, because she felt so sleepy. She has been taking 50 mg at bedtime. She reports she has not had any bad migraines, will have about 1 minor headache every 1 to 2 weeks. She describes this headache as starting behind her eyes, feels like a sinus issue. She does report this is exacerbated by bright light, sunlight. She tries to wear her sunglasses at all times when outside. One time she did have to take the Fioricet, she reports it was beneficial. She had an eye exam recently, she did get new glasses, new lenses. Before her bad migraines she will get shadows, that is her aura. She does also complain of some neck pain with her headache. She has not had any episodes of confusion, numbness, trouble walking, or nausea.  Update April 30, 2019 SS: She presents today for follow-up via telephone visit, couldn't connect on My Chart.  She reports overall her headaches have been doing much better, on 75 mg daily of Topamax.  Since April, she had 1 headache in October when she was in [place], could be due to high altitude, lasted 3 days.  The headache was mild to moderate, she took Fioricet with partial relief, didn't take cocktail of medications.  She indicates her overall health has been good.  She denies any new problems or concerns.  She is wondering if she might be a good candidate for Ubrelvy.   Update October 28, 2019 SS: Here today for follow-up unaccompanied. Remains on  Topamax 25 mg in the morning, 50 mg at bedtime- this dosing regimen works well for her, keeps her from daytime headache.  Has not had significant headache since last seen in December.  Has Fioricet on hand  as needed, but has not taken, if onset of headache is felt (pain behind eyes, little tings in her head), will take ibuprofen with good benefit, also helps with back pain.  Remains on aspirin 81 mg daily.  REVIEW OF SYSTEMS: Out of a complete 14 system review of symptoms, the patient complains only of the following symptoms, and all other reviewed systems are negative.  Headache  ALLERGIES: Allergies  Allergen Reactions  . Amitriptyline Other (See Comments)    Worsened migraine and confusion  . Oxybutynin Other (See Comments)    Urinary retention/hesitancy  . Tramadol Other (See Comments)    Causes confusion and dizziness  . Niacin And Related Itching  . Septra [Sulfamethoxazole-Trimethoprim] Itching    HOME MEDICATIONS: Outpatient Medications Prior to Visit  Medication Sig Dispense Refill  . acetaminophen (TYLENOL) 325 MG tablet Take 650 mg by mouth every 6 (six) hours as needed for mild pain.     Marland Kitchen aspirin 81 MG tablet Take 81 mg by mouth daily.    Marland Kitchen atorvastatin (LIPITOR) 40 MG tablet Take 1 tablet (40 mg total) by mouth daily. 90 tablet 3  . co-enzyme Q-10 30 MG capsule Take 30 mg by mouth daily.     . fluticasone (FLONASE) 50 MCG/ACT nasal spray USE 2 SPRAYS IN EACH NOSTRIL EVERY DAY 48 g 0  . furosemide (LASIX) 20 MG tablet TAKE 1 TABLET BY MOUTH DAILY AS NEEDED FOR FLUID. 30 tablet 1  . IBU 600 MG tablet TAKE 1 TABLET TWICE DAILY AS NEEDED FOR  MIGRAINE  HEADACHE 30 tablet 1  . methocarbamol (ROBAXIN) 500 MG tablet Take 1 tablet (500 mg total) by mouth 3 (three) times daily as needed for muscle spasms (sedation precautions). 60 tablet 1  . Multiple Vitamin (MULTIVITAMIN) tablet Take 1 tablet by mouth daily.    Marland Kitchen omeprazole (PRILOSEC) 20 MG capsule Take 1 capsule (20 mg total) by mouth daily. 90 capsule 3  . ondansetron (ZOFRAN ODT) 4 MG disintegrating tablet Take 1 tablet (4 mg total) by mouth every 8 (eight) hours as needed. 20 tablet 6  . triamcinolone cream (KENALOG) 0.1 %  APPLY TO AFFECTED AREA TWICE A DAY 30 g 0  . vitamin B-12 (CYANOCOBALAMIN) 1000 MCG tablet Take 1 tablet (1,000 mcg total) by mouth every Monday, Wednesday, and Friday.    . butalbital-acetaminophen-caffeine (FIORICET, ESGIC) 50-325-40 MG tablet Take 1 tablet by mouth every 6 (six) hours as needed for headache. 10 tablet 5  . topiramate (TOPAMAX) 25 MG tablet Take 3 tablets (75 mg total) by mouth at bedtime. 270 tablet 0   No facility-administered medications prior to visit.    PAST MEDICAL HISTORY: Past Medical History:  Diagnosis Date  . Allergy   . Anemia   . Arthritis   . Cataract   . Controlled type 2 diabetes mellitus without complication, without long-term current use of insulin ([place])   . Diverticulitis   . History of chicken pox   . Hyperlipidemia   . Kidney stone   . 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 Migraine   . Urge incontinence 04/29/2014    PAST SURGICAL HISTORY: Past Surgical History:  Procedure Laterality Date  . ABDOMINAL HYSTERECTOMY  2006   cervix remains, menorrhagia  . APPENDECTOMY  1970  .  [place]   biopsy-left  . BREAST SURGERY  1995   right milk duct removed  . CARPAL TUNNEL RELEASE Right 02/2017  . CHOLECYSTECTOMY  1971  . COLONOSCOPY  11/2005   diverticulosis o/w WNL [facility])  . DEXA  2015   spine -0.4, hip -1.0 WNL  . LITHOTRIPSY    . LUMBAR EPIDURAL INJECTION Left 10/2017   transforaminal L L3/4 (Dr Maryjean Ka)  . TUBAL LIGATION      FAMILY HISTORY: Family History  Problem Relation Age of Onset  . Heart disease Mother 37  . Hyperlipidemia Mother   . Hyperlipidemia Father   . Stroke Father   . Heart disease Father   . Diabetes Father   . Fibromyalgia Sister   . Hyperlipidemia Sister   . Heart disease Brother   . Diabetes Brother     SOCIAL HISTORY: Social History   Socioeconomic History  . Marital status: Married    Spouse name: [name]  . Number of children: 1  . Years of education: 42  . Highest education level:  Not on file  Occupational History  . Occupation: Retired    Comment: Retired  Immunologist  . Smoking status: Never Smoker  . Smokeless tobacco: Never Used  Vaping Use  . Vaping Use: Never used  Substance and Sexual Activity  . Alcohol use: No    Alcohol/week: 0.0 standard drinks  . Drug use: No  . Sexual activity: Not Currently  Other Topics Concern  . Not on file  Social History Narrative   Patient is married and lives at home with her husband [name]). Patient is retired.    Edu: highschool education.   Right handed.   Exercise 3 times a week.   Diet: good water, fruits/vegetables daily   Caffeine: None      HAs living will, HCPOA, full code   Social Determinants of Health   Financial Resource Strain: Low Risk   . Difficulty of Paying Living Expenses: Not hard at all  Food Insecurity: No Food Insecurity  . Worried About Charity fundraiser in the Last Year: Never true  . Ran Out of Food in the Last Year: Never true  Transportation Needs: No Transportation Needs  . Lack of Transportation (Medical): No  . Lack of Transportation (Non-Medical): No  Physical Activity: Insufficiently Active  . Days of Exercise per Week: 7 days  . Minutes of Exercise per Session: 20 min  Stress: No Stress Concern Present  . Feeling of Stress : Not at all  Social Connections:   . Frequency of Communication with Friends and Family:   . Frequency of Social Gatherings with Friends and Family:   . Attends Religious Services:   . Active Member of Clubs or Organizations:   . Attends Archivist Meetings:   Marland Kitchen Marital Status:   Intimate Partner Violence: Not At Risk  . Fear of Current or Ex-Partner: No  . Emotionally Abused: No  . Physically Abused: No  . Sexually Abused: No   PHYSICAL EXAM  Vitals:   10/28/19 1009  BP: 134/64  Pulse: 65  Weight: 170 lb (77.1 kg)  Height: 5\' 2"  (1.575 m)   Body mass index is 31.09 kg/m.  Generalized: Well developed, in no acute distress    Neurological examination  Mentation: Alert oriented to time, place, history taking. Follows all commands speech and language fluent Cranial nerve II-XII: Pupils were equal round reactive to light. Extraocular movements were full, visual field were  full on confrontational test. Facial sensation and strength were normal. Head turning and shoulder shrug  were normal and symmetric. Motor: The motor testing reveals 5 over 5 strength of all 4 extremities. Good symmetric motor tone is noted throughout.  Sensory: Sensory testing is intact to soft touch on all 4 extremities. No evidence of extinction is noted.  Coordination: Cerebellar testing reveals good finger-nose-finger and heel-to-shin bilaterally.  Gait and station: Gait is normal.  Reflexes: Deep tendon reflexes are symmetric and normal bilaterally.   DIAGNOSTIC DATA (LABS, IMAGING, TESTING) - I reviewed patient records, labs, notes, testing and imaging myself where available.  Lab Results  Component Value Date   WBC 6.2 04/22/2018   HGB 13.4 04/22/2018   HCT 41.9 04/22/2018   MCV 92.3 04/22/2018   PLT 284 04/22/2018      Component Value Date/Time   NA 142 10/01/2019 0909   K 3.8 10/01/2019 0909   CL 110 10/01/2019 0909   CO2 27 10/01/2019 0909   GLUCOSE 113 (H) 10/01/2019 0909   BUN 17 10/01/2019 0909   CREATININE 0.71 10/01/2019 0909   CALCIUM 9.2 10/01/2019 0909   PROT 7.1 10/01/2019 0909   ALBUMIN 4.2 10/01/2019 0909   AST 15 10/01/2019 0909   ALT 20 10/01/2019 0909   ALKPHOS 76 10/01/2019 0909   BILITOT 0.6 10/01/2019 0909   GFRNONAA >60 04/22/2018 1325   GFRAA >60 04/22/2018 1325   Lab Results  Component Value Date   CHOL 143 10/01/2019   HDL 46.80 10/01/2019   LDLCALC 77 10/01/2019   TRIG 97.0 10/01/2019   CHOLHDL 3 10/01/2019   Lab Results  Component Value Date   HGBA1C 6.5 10/01/2019   Lab Results  Component Value Date   VITAMINB12 817 10/01/2019   Lab Results  Component Value Date   TSH 1.97  09/20/2018      ASSESSMENT AND PLAN 74 y.o. year old female  has a past medical history of Allergy, Anemia, Arthritis, Cataract, Controlled type 2 diabetes mellitus without complication, without long-term current use of insulin ([place]), Diverticulitis, History of chicken pox, Hyperlipidemia, Kidney stone, Lactose intolerance in adult, Migraine, and Urge incontinence (04/29/2014). here with:  1.  Chronic migraine headache with aura 2.  Cerebral small vessel disease  Overall, headaches remain well controlled.  She will remain on Topamax 25 mg am/50 mg pm for migraine prevention.  I will refill Fioricet to have on hand as needed for significant headache, may combine with Zofran, Robaxin, Aleve, even Benadryl for severe prolonged headache.  She will remain on aspirin 81 mg daily, due to cerebral small vessel disease.  She will follow-up in 1 year or sooner if needed.  I spent 20 minutes of face-to-face and non-face-to-face time with patient.  This included previsit chart review, lab review, study review, order entry, electronic health record documentation, patient education.  Butler Denmark, AGNP-C, DNP 10/28/2019, 10:31 AM [facility] [address], [place], [place] [address] [phone]

## 2019-11-28 ENCOUNTER — Other Ambulatory Visit: Payer: Self-pay | Admitting: Family Medicine

## 2019-11-29 ENCOUNTER — Other Ambulatory Visit: Payer: Self-pay

## 2019-11-29 MED ORDER — METHOCARBAMOL 500 MG PO TABS: 500.0000 mg | ORAL_TABLET | Freq: Three times a day (TID) | ORAL | 1 refills | Status: DC | PRN

## 2019-11-29 NOTE — Telephone Encounter (Signed)
Received faxed refill request from [facility].   Robaxin Last rx:  01/23/19, #60/1 Last OV:  10/04/19, AWV prt 2 Next OV:  none

## 2019-12-17 ENCOUNTER — Other Ambulatory Visit: Payer: Self-pay

## 2019-12-17 DIAGNOSIS — L282 Other prurigo: Secondary | ICD-10-CM

## 2019-12-17 NOTE — Telephone Encounter (Signed)
Pt's husband, [name] (on dpr), confirms pt uses [facility] order now.  Triamcinolone cream Last rx:  08/16/19, #30 g Last OV:  10/04/19, AWV prt 2 Next OV:  04/06/20, 6 mo f/u

## 2019-12-18 MED ORDER — TRIAMCINOLONE ACETONIDE 0.1 % EX CREA: TOPICAL_CREAM | CUTANEOUS | 1 refills | Status: DC

## 2020-01-03 ENCOUNTER — Other Ambulatory Visit: Payer: Self-pay | Admitting: Family Medicine

## 2020-01-03 NOTE — Telephone Encounter (Signed)
Robaxin Last filled:  12/20/19, #60 Last OV:  10/04/19, AWV prt 2 Next OV:  04/06/20, 6 mo f/u

## 2020-02-12 DIAGNOSIS — L821 Other seborrheic keratosis: Secondary | ICD-10-CM | POA: Diagnosis not present

## 2020-02-12 DIAGNOSIS — L304 Erythema intertrigo: Secondary | ICD-10-CM | POA: Diagnosis not present

## 2020-02-12 DIAGNOSIS — Z1283 Encounter for screening for malignant neoplasm of skin: Secondary | ICD-10-CM | POA: Diagnosis not present

## 2020-02-13 ENCOUNTER — Ambulatory Visit (INDEPENDENT_AMBULATORY_CARE_PROVIDER_SITE_OTHER): Payer: Medicare HMO

## 2020-02-13 ENCOUNTER — Other Ambulatory Visit: Payer: Self-pay

## 2020-02-13 DIAGNOSIS — Z23 Encounter for immunization: Secondary | ICD-10-CM

## 2020-02-27 ENCOUNTER — Other Ambulatory Visit: Payer: Self-pay

## 2020-02-27 NOTE — Telephone Encounter (Signed)
Ibuprofen Last rx:  07/30/19, #30/1 Last OV:  10/04/19, AWV prt 2 Next OV:  04/06/20, 6 mo f/u

## 2020-03-01 MED ORDER — IBUPROFEN 600 MG PO TABS: ORAL_TABLET | ORAL | 1 refills | Status: DC

## 2020-04-06 ENCOUNTER — Ambulatory Visit: Payer: Medicare HMO | Admitting: Family Medicine

## 2020-04-07 ENCOUNTER — Encounter: Payer: Self-pay | Admitting: Family Medicine

## 2020-04-07 ENCOUNTER — Other Ambulatory Visit: Payer: Self-pay

## 2020-04-07 ENCOUNTER — Ambulatory Visit (INDEPENDENT_AMBULATORY_CARE_PROVIDER_SITE_OTHER): Payer: Medicare HMO | Admitting: Family Medicine

## 2020-04-07 VITALS — BP 122/70 | HR 69 | Temp 98.0°F | Ht 62.0 in | Wt 166.2 lb

## 2020-04-07 DIAGNOSIS — E119 Type 2 diabetes mellitus without complications: Secondary | ICD-10-CM | POA: Diagnosis not present

## 2020-04-07 DIAGNOSIS — R202 Paresthesia of skin: Secondary | ICD-10-CM

## 2020-04-07 LAB — POCT GLYCOSYLATED HEMOGLOBIN (HGB A1C): Hemoglobin A1C: 6.1 % — AB (ref 4.0–5.6)

## 2020-04-07 MED ORDER — ONDANSETRON 4 MG PO TBDP: 4.0000 mg | ORAL_TABLET | Freq: Three times a day (TID) | ORAL | 3 refills | Status: DC | PRN

## 2020-04-07 NOTE — Assessment & Plan Note (Signed)
Diet controlled. Chronic, has been able to reverse with weight loss to prediabetes range. Congratulated.

## 2020-04-07 NOTE — Progress Notes (Signed)
Patient ID: Dana [name], female    DOB: 1945-08-02, 74 y.o.   MRN: [id number]  This visit was conducted in person.  BP 122/70 (BP Location: Left Arm, Patient Position: Sitting, Cuff Size: Normal)   Pulse 69   Temp 98 F (36.7 C) (Temporal)   Ht 5\' 2"  (1.575 m)   Wt 166 lb 3 oz (75.4 kg)   SpO2 99%   BMI 30.40 kg/m    CC: DM f/u visit  Subjective:   HPI: Dana [name] is a 74 y.o. female presenting on 04/07/2020 for Diabetes (Here for 6 mo f/u.)   5 lb weight loss- started weight watchers 01/2020.  Requests zofran refill for occasional PM nausea - unclear cause wonders if it's something she eats.   DM - does not regularly check sugars - about once a week (120s). Compliant with antihyperglycemic regimen which includes: diet controlled. Denies low sugars or hypoglycemic symptoms. Some foot paresthesias - no significant alcohol intake, continues replacing b12 orally. Last diabetic eye exam 04/2019. Pneumovax: 2017. Prevnar: 2015. Glucometer brand: uses husband's. DSME: started 2019. Lab Results  Component Value Date   HGBA1C 6.1 (A) 04/07/2020   Diabetic Foot Exam - Simple   Simple Foot Form Diabetic Foot exam was performed with the following findings: Yes 04/07/2020  3:04 PM  Visual Inspection No deformities, no ulcerations, no other skin breakdown bilaterally: Yes Sensation Testing Intact to touch and monofilament testing bilaterally: Yes Pulse Check Posterior Tibialis and Dorsalis pulse intact bilaterally: Yes Comments    Lab Results  Component Value Date   MICROALBUR 3.1 (H) 10/01/2019         Relevant past medical, surgical, family and social history reviewed and updated as indicated. Interim medical history since our last visit reviewed. Allergies and medications reviewed and updated. Outpatient Medications Prior to Visit  Medication Sig Dispense Refill  . acetaminophen (TYLENOL) 325 MG tablet Take 650 mg by mouth every 6 (six) hours as needed for mild pain.      Marland Kitchen aspirin 81 MG tablet Take 81 mg by mouth daily.    Marland Kitchen atorvastatin (LIPITOR) 40 MG tablet Take 1 tablet (40 mg total) by mouth daily. 90 tablet 3  . butalbital-acetaminophen-caffeine (FIORICET) 50-325-40 MG tablet Take 1 tablet by mouth every 6 (six) hours as needed for headache. 10 tablet 1  . co-enzyme Q-10 30 MG capsule Take 30 mg by mouth daily.     . fluticasone (FLONASE) 50 MCG/ACT nasal spray USE 2 SPRAYS IN EACH NOSTRIL EVERY DAY 48 g 0  . furosemide (LASIX) 20 MG tablet TAKE 1 TABLET BY MOUTH DAILY AS NEEDED FOR FLUID. 30 tablet 1  . ibuprofen (IBU) 600 MG tablet TAKE 1 TABLET TWICE DAILY AS NEEDED FOR  MIGRAINE  HEADACHE 30 tablet 1  . methocarbamol (ROBAXIN) 500 MG tablet TAKE 1 TABLET (500 MG TOTAL) BY MOUTH 3 (THREE) TIMES DAILY AS NEEDED FOR MUSCLE SPASMS (SEDATION PRECAUTIONS). 60 tablet 1  . Multiple Vitamin (MULTIVITAMIN) tablet Take 1 tablet by mouth daily.    Marland Kitchen omeprazole (PRILOSEC) 20 MG capsule Take 1 capsule (20 mg total) by mouth daily. 90 capsule 3  . topiramate (TOPAMAX) 25 MG tablet Take 3 tablets (75 mg total) by mouth at bedtime. 270 tablet 4  . triamcinolone cream (KENALOG) 0.1 % APPLY TO AFFECTED AREA TWICE A DAY 80 g 1  . vitamin B-12 (CYANOCOBALAMIN) 1000 MCG tablet Take 1 tablet (1,000 mcg total) by mouth every Monday, Wednesday, and Friday.    Marland Kitchen  ondansetron (ZOFRAN ODT) 4 MG disintegrating tablet Take 1 tablet (4 mg total) by mouth every 8 (eight) hours as needed. 20 tablet 6   No facility-administered medications prior to visit.     Per HPI unless specifically indicated in ROS section below Review of Systems Objective:  BP 122/70 (BP Location: Left Arm, Patient Position: Sitting, Cuff Size: Normal)   Pulse 69   Temp 98 F (36.7 C) (Temporal)   Ht 5\' 2"  (1.575 m)   Wt 166 lb 3 oz (75.4 kg)   SpO2 99%   BMI 30.40 kg/m   Wt Readings from Last 3 Encounters:  04/07/20 166 lb 3 oz (75.4 kg)  10/28/19 170 lb (77.1 kg)  10/04/19 171 lb 1 oz (77.6 kg)       Physical Exam Vitals and nursing note reviewed.  Constitutional:      General: She is not in acute distress.    Appearance: Normal appearance. She is well-developed. She is not ill-appearing.  HENT:     Head: Normocephalic and atraumatic.  Eyes:     General: No scleral icterus.    Extraocular Movements: Extraocular movements intact.     Conjunctiva/sclera: Conjunctivae normal.     Pupils: Pupils are equal, round, and reactive to light.  Cardiovascular:     Rate and Rhythm: Normal rate and regular rhythm.     Pulses: Normal pulses.     Heart sounds: Normal heart sounds. No murmur heard.   Pulmonary:     Effort: Pulmonary effort is normal. No respiratory distress.     Breath sounds: Normal breath sounds. No wheezing, rhonchi or rales.  Musculoskeletal:     Cervical back: Normal range of motion and neck supple.     Right lower leg: No edema.     Left lower leg: No edema.     Comments: See HPI for foot exam if done  Lymphadenopathy:     Cervical: No cervical adenopathy.  Skin:    General: Skin is warm and dry.     Findings: No rash.  Neurological:     Mental Status: She is alert.       Results for orders placed or performed in visit on 04/07/20  POCT glycosylated hemoglobin (Hb A1C)  Result Value Ref Range   Hemoglobin A1C 6.1 (A) 4.0 - 5.6 %   HbA1c POC (<> result, manual entry)     HbA1c, POC (prediabetic range)     HbA1c, POC (controlled diabetic range)     Lab Results  Component Value Date   VITAMINB12 817 10/01/2019    Assessment & Plan:  This visit occurred during the SARS-CoV-2 public health emergency.  Safety protocols were in place, including screening questions prior to the visit, additional usage of staff PPE, and extensive cleaning of exam room while observing appropriate contact time as indicated for disinfecting solutions.   Problem List Items Addressed This Visit    Paresthesia    Ongoing hand and foot paresthesias, as well as PM nausea -  anticipate side effects to topamax. Dicsussed.       Controlled type 2 diabetes mellitus without complication, without long-term current use of insulin ([place]) - Primary    Diet controlled. Chronic, has been able to reverse with weight loss to prediabetes range. Congratulated.       Relevant Orders   POCT glycosylated hemoglobin (Hb A1C) (Completed)       Meds ordered this encounter  Medications  . ondansetron (ZOFRAN ODT) 4 MG disintegrating tablet  Sig: Take 1 tablet (4 mg total) by mouth every 8 (eight) hours as needed.    Dispense:  20 tablet    Refill:  3   Orders Placed This Encounter  Procedures  . POCT glycosylated hemoglobin (Hb A1C)    Patient Instructions  Sugars are down to prediabetes range! Congratulations!  Continue healthy diet and lifestyle changes.  Return as needed or in 6 months for physical.    Follow up plan: Return in about 6 months (around 10/05/2020) for annual exam, prior fasting for blood work, medicare wellness visit.  [name], MD

## 2020-04-07 NOTE — Assessment & Plan Note (Signed)
Ongoing hand and foot paresthesias, as well as PM nausea - anticipate side effects to topamax. Dicsussed.

## 2020-04-07 NOTE — Patient Instructions (Addendum)
Sugars are down to prediabetes range! Congratulations!  Continue healthy diet and lifestyle changes.  Return as needed or in 6 months for physical.

## 2020-04-13 DIAGNOSIS — E119 Type 2 diabetes mellitus without complications: Secondary | ICD-10-CM | POA: Diagnosis not present

## 2020-04-13 DIAGNOSIS — H04123 Dry eye syndrome of bilateral lacrimal glands: Secondary | ICD-10-CM | POA: Diagnosis not present

## 2020-04-13 DIAGNOSIS — H524 Presbyopia: Secondary | ICD-10-CM | POA: Diagnosis not present

## 2020-04-13 DIAGNOSIS — H16223 Keratoconjunctivitis sicca, not specified as Sjogren's, bilateral: Secondary | ICD-10-CM | POA: Diagnosis not present

## 2020-04-13 DIAGNOSIS — H2513 Age-related nuclear cataract, bilateral: Secondary | ICD-10-CM | POA: Diagnosis not present

## 2020-04-13 DIAGNOSIS — H5213 Myopia, bilateral: Secondary | ICD-10-CM | POA: Diagnosis not present

## 2020-04-13 LAB — HM DIABETES EYE EXAM

## 2020-04-20 ENCOUNTER — Encounter: Payer: Self-pay | Admitting: Family Medicine

## 2020-05-06 DIAGNOSIS — Z1231 Encounter for screening mammogram for malignant neoplasm of breast: Secondary | ICD-10-CM | POA: Diagnosis not present

## 2020-05-19 ENCOUNTER — Ambulatory Visit (INDEPENDENT_AMBULATORY_CARE_PROVIDER_SITE_OTHER): Payer: Medicare HMO | Admitting: Family Medicine

## 2020-05-19 ENCOUNTER — Encounter: Payer: Self-pay | Admitting: Family Medicine

## 2020-05-19 ENCOUNTER — Other Ambulatory Visit: Payer: Self-pay

## 2020-05-19 DIAGNOSIS — M722 Plantar fascial fibromatosis: Secondary | ICD-10-CM | POA: Insufficient documentation

## 2020-05-19 MED ORDER — IBUPROFEN 600 MG PO TABS: 600.0000 mg | ORAL_TABLET | Freq: Two times a day (BID) | ORAL | 1 refills | Status: DC | PRN

## 2020-05-19 NOTE — Progress Notes (Signed)
Patient ID: Dana [name], female    DOB: [birth year], 75 y.o.   MRN: [id number]  This visit was conducted in person.  BP 124/66 (BP Location: Left Arm, Patient Position: Sitting, Cuff Size: Normal)   Pulse 68   Temp 98 F (36.7 C) (Temporal)   Ht 5\' 2"  (1.575 m)   Wt 166 lb 8 oz (75.5 kg)   SpO2 98%   BMI 30.45 kg/m    CC: right foot pain  Subjective:   HPI: Dana [name] is a 75 y.o. female presenting on 05/19/2020 for Foot Pain (C/o right foot pain located in bottom of heel.  Started about 2 wks ago. Tried OTC diclofenac gel and occasionally wearing a brace. )   2 wk h/o R foot pain to heel. Denies inciting trauma/injury or falls. No pain with sitting or supine, worse with walking, especially first few steps of the morning. Mild ankle pain. No achilles pain.  Treating with diclofenac gel and occasional ankle brace. Also taking tylenol/ibuporfen.  She started using Dr Felicie Morn OTC insoles prior to foot pain starting.      Relevant past medical, surgical, family and social history reviewed and updated as indicated. Interim medical history since our last visit reviewed. Allergies and medications reviewed and updated. Outpatient Medications Prior to Visit  Medication Sig Dispense Refill  . acetaminophen (TYLENOL) 325 MG tablet Take 650 mg by mouth every 6 (six) hours as needed for mild pain.     Marland Kitchen aspirin 81 MG tablet Take 81 mg by mouth daily.    Marland Kitchen atorvastatin (LIPITOR) 40 MG tablet Take 1 tablet (40 mg total) by mouth daily. 90 tablet 3  . butalbital-acetaminophen-caffeine (FIORICET) 50-325-40 MG tablet Take 1 tablet by mouth every 6 (six) hours as needed for headache. 10 tablet 1  . co-enzyme Q-10 30 MG capsule Take 30 mg by mouth daily.     . fluticasone (FLONASE) 50 MCG/ACT nasal spray USE 2 SPRAYS IN EACH NOSTRIL EVERY DAY 48 g 0  . furosemide (LASIX) 20 MG tablet TAKE 1 TABLET BY MOUTH DAILY AS NEEDED FOR FLUID. 30 tablet 1  . methocarbamol (ROBAXIN) 500 MG tablet TAKE 1  TABLET (500 MG TOTAL) BY MOUTH 3 (THREE) TIMES DAILY AS NEEDED FOR MUSCLE SPASMS (SEDATION PRECAUTIONS). 60 tablet 1  . Multiple Vitamin (MULTIVITAMIN) tablet Take 1 tablet by mouth daily.    Marland Kitchen omeprazole (PRILOSEC) 20 MG capsule Take 1 capsule (20 mg total) by mouth daily. 90 capsule 3  . ondansetron (ZOFRAN ODT) 4 MG disintegrating tablet Take 1 tablet (4 mg total) by mouth every 8 (eight) hours as needed. 20 tablet 3  . topiramate (TOPAMAX) 25 MG tablet Take 3 tablets (75 mg total) by mouth at bedtime. 270 tablet 4  . triamcinolone cream (KENALOG) 0.1 % APPLY TO AFFECTED AREA TWICE A DAY 80 g 1  . vitamin B-12 (CYANOCOBALAMIN) 1000 MCG tablet Take 1 tablet (1,000 mcg total) by mouth every Monday, Wednesday, and Friday.    Marland Kitchen ibuprofen (IBU) 600 MG tablet TAKE 1 TABLET TWICE DAILY AS NEEDED FOR  MIGRAINE  HEADACHE 30 tablet 1   No facility-administered medications prior to visit.     Per HPI unless specifically indicated in ROS section below Review of Systems Objective:  BP 124/66 (BP Location: Left Arm, Patient Position: Sitting, Cuff Size: Normal)   Pulse 68   Temp 98 F (36.7 C) (Temporal)   Ht 5\' 2"  (1.575 m)   Wt 166 lb 8  oz (75.5 kg)   SpO2 98%   BMI 30.45 kg/m   Wt Readings from Last 3 Encounters:  05/19/20 166 lb 8 oz (75.5 kg)  04/07/20 166 lb 3 oz (75.4 kg)  10/28/19 170 lb (77.1 kg)      Physical Exam Vitals and nursing note reviewed.  Constitutional:      Appearance: Normal appearance. She is not ill-appearing.  Musculoskeletal:        General: Normal range of motion.     Right lower leg: No edema.     Left lower leg: No edema.     Comments:  2+ DP bilaterally L foot WNL R foot:  FROM without ligament laxity, no malleolar pain No pain at achilles tendon No pain with calcaneal squeeze Tender to palpation distal to heel along plantar fascia   Skin:    General: Skin is warm and dry.     Findings: No erythema or rash.  Neurological:     Mental Status: She is  alert.  Psychiatric:        Mood and Affect: Mood normal.        Behavior: Behavior normal.       Lab Results  Component Value Date   HGBA1C 6.1 (A) 04/07/2020    Assessment & Plan:  This visit occurred during the SARS-CoV-2 public health emergency.  Safety protocols were in place, including screening questions prior to the visit, additional usage of staff PPE, and extensive cleaning of exam room while observing appropriate contact time as indicated for disinfecting solutions.   Problem List Items Addressed This Visit    Plantar fasciitis of right foot    Story/exam consistent with this.  Reviewed etiology of plantar fasciitis as well as anticipated course of recovery.  rec NSAID, tylenol, heel lift gel insert, frozen water bottle massage, and regular stretching. To expect symptomatology for up to 6-12 months. Update if worsening symptoms to consider podiatry referral.           Meds ordered this encounter  Medications  . ibuprofen (IBU) 600 MG tablet    Sig: Take 1 tablet (600 mg total) by mouth 2 (two) times daily as needed for headache (heel pain).    Dispense:  50 tablet    Refill:  1   No orders of the defined types were placed in this encounter.   Patient Instructions  I think you have plantar fasciitis.  May use topical anti inflammatory (voltaren, diclofenac) as well as ibuprofen or tylenol as needed for pain.  This usually gets better on its own - supportive care until then.  Use gel insert heel lift to shoes, consider frozen water bottle massage, frequent gentle stretching of the foot.  If worsening, let us know to consider foot doctor.   Follow up plan: Return if symptoms worsen or fail to improve.  [name], MD

## 2020-05-19 NOTE — Patient Instructions (Addendum)
I think you have plantar fasciitis.  May use topical anti inflammatory (voltaren, diclofenac) as well as ibuprofen or tylenol as needed for pain.  This usually gets better on its own - supportive care until then.  Use gel insert heel lift to shoes, consider frozen water bottle massage, frequent gentle stretching of the foot.  If worsening, let us know to consider foot doctor.

## 2020-05-19 NOTE — Assessment & Plan Note (Addendum)
Story/exam consistent with this.  Reviewed etiology of plantar fasciitis as well as anticipated course of recovery.  rec NSAID, tylenol, heel lift gel insert, frozen water bottle massage, and regular stretching. To expect symptomatology for up to 6-12 months. Update if worsening symptoms to consider podiatry referral.

## 2020-07-20 ENCOUNTER — Encounter: Payer: Self-pay | Admitting: Family Medicine

## 2020-07-20 ENCOUNTER — Other Ambulatory Visit: Payer: Self-pay

## 2020-07-20 ENCOUNTER — Ambulatory Visit (INDEPENDENT_AMBULATORY_CARE_PROVIDER_SITE_OTHER): Payer: Medicare HMO | Admitting: Family Medicine

## 2020-07-20 VITALS — BP 126/70 | HR 64 | Temp 98.2°F | Ht 62.0 in | Wt 168.5 lb

## 2020-07-20 DIAGNOSIS — M25571 Pain in right ankle and joints of right foot: Secondary | ICD-10-CM | POA: Insufficient documentation

## 2020-07-20 MED ORDER — IBUPROFEN 600 MG PO TABS: 600.0000 mg | ORAL_TABLET | Freq: Two times a day (BID) | ORAL | 1 refills | Status: DC | PRN

## 2020-07-20 NOTE — Patient Instructions (Signed)
I think you have peroneal tendonitis. May use voltaren topical gel to the right foot at tender area. May use ibuprofen as needed. Do exercises provided today.  Let us know if not improving with this.   Peroneal Tendinopathy  Peroneal tendinopathy is irritation of the tendons that pass behind your ankle (peroneal tendons). These tendons attach muscles in your foot to a bone on the side of your foot and underneath the arch of your foot. This condition can cause your peroneal tendons to get bigger and swell. What are the causes? This condition may be caused by:  Putting stress on your ankle over and over again (overuse injury).  A sudden injury that puts stress on your tendons, such as an ankle sprain. What increases the risk? You are more likely to develop this condition if you:  Have high arches.  Play sports that involve putting stress on the ankle over and over again. These sports include: ? Running. ? Dancing. ? Soccer. ? Basketball. What are the signs or symptoms? Symptoms of this condition can start suddenly or develop gradually. Symptoms of this condition include:  Pain in the back of the ankle, on the side of the foot, or in the arch of the foot.  Pain that gets worse with activity and better with rest.  Swelling.  Warmth.  Weakness in your foot or ankle. How is this diagnosed? This condition may be diagnosed based on:  Your symptoms.  Your medical history.  A physical exam. During the exam, your health care provider may move your foot and ankle and test the strength of your leg muscles.  Imaging tests, such as: ? X-rays or a CT scan to check for bone injury. ? MRI or ultrasound to check for muscle or tendon injury. How is this treated? This condition may be treated by:  Keeping your body weight off your ankle for several days.  Returning to full activity gradually.  Putting ice on your ankle to reduce swelling.  Taking NSAIDs, such as  ibuprofen.  Having medicine injected into your tendon to reduce swelling.  Wearing a removable boot or brace for ankle support.  Doing range-of-motion exercises and strengthening exercises (physical therapy) when pain and swelling improve. If the condition does not improve with treatment, or if a tendon or muscle is damaged, surgery may be needed. Follow these instructions at home: If you have a boot or brace:  Wear the boot or brace as told by your health care provider. Remove it only as told by your health care provider.  Loosen the boot or brace if your toes tingle, become numb, or turn cold and blue.  Keep the boot or brace clean.  If the boot or brace is not waterproof: ? Do not let it get wet. ? Cover it with a watertight covering when you take a bath or shower. Managing pain, stiffness, and swelling  If directed, put ice on the injured area. ? If you have a removable boot or brace, remove it as told by your health care provider. ? Put ice in a plastic bag. ? Place a towel between your skin and the bag. ? Leave the ice on for 20 minutes, 2-3 times a day.  Move your toes often to reduce stiffness and swelling.  Raise (elevate) your ankle above the level of your heart while you are sitting or lying down.   Activity  Do not do activities that make pain or swelling worse.  Do exercises as told by your  health care provider.  Return to your normal activities as told by your health care provider. Ask your health care provider what activities are safe for you.  Ask your health care provider when it is safe to drive if you have a boot or brace on your foot. General instructions  Take over-the-counter and prescription medicines only as told by your health care provider.  Do not use any products that contain nicotine or tobacco, such as cigarettes, e-cigarettes, and chewing tobacco. These can delay healing. If you need help quitting, ask your health care provider.  Keep all  follow-up visits as told by your health care provider. This is important. How is this prevented?  Wear supportive footwear that is appropriate for your athletic activity.  Avoid athletic activities that cause swelling or pain in your ankle or foot.  See your health care provider if you have pain or swelling that does not improve after a few days of rest.  Stop training if you develop pain or swelling.  If you start a new athletic activity, start gradually to build up your strength, endurance, and flexibility.  Warm up and stretch before being active.  Cool down and stretch after being active. Contact a health care provider if:  Your symptoms get worse.  Your symptoms do not improve in 2-4 weeks.  You develop new, unexplained symptoms. Summary  Peroneal tendinopathy is irritation of the tendons that pass behind your ankle.  This condition is caused by overuse or sudden injury to the peroneal tendon.  Symptoms include pain, swelling, warmth, and weakness in your foot or ankle.  This condition is treated with rest, ice, medicines, physical therapy, and surgery if needed. This information is not intended to replace advice given to you by your health care provider. Make sure you discuss any questions you have with your health care provider. Document Revised: 08/16/2018 Document Reviewed: 06/04/2018 Elsevier Patient Education  2021 [employer].

## 2020-07-20 NOTE — Assessment & Plan Note (Signed)
Anticipate peroneal tendonitis - discussed this. Exercises from [facility] pt advisor handout provided rec continue ibuprofen PRN, add voltaren topically bid to tid.  Update if not improving, consider nitroglycerin patches vs SM eval. Pt agrees with plan.

## 2020-07-20 NOTE — Progress Notes (Signed)
Patient ID: Dana [name], female    DOB: [birth year], 75 y.o.   MRN: [id number]  This visit was conducted in person.  BP 126/70   Pulse 64   Temp 98.2 F (36.8 C) (Temporal)   Ht 5\' 2"  (1.575 m)   Wt 168 lb 8 oz (76.4 kg)   SpO2 98%   BMI 30.82 kg/m    CC: R foot pain  Subjective:   HPI: Dana [name] is a 75 y.o. female presenting on 07/20/2020 for Foot Pain (C/o right foot pain.  Started about 3 mos ago.  Pain is in top of foot around to the ankle when walking.  Pt has tried several different shoes, foot supports and OTC topical pain creams.  Seen for previously and possible PT was mentioned. )   Ongoing R lateral ankle pain for the past month. Worse with stairs due to difficulty with dorsiflexion and with eversion of foot.  Seen 05/2020 for R plantar fasciitis - heel sleeve did help - now no longer hurting.  She's stopped sleeve use wondering if it was aggravating lateral ankle.   Treating discomfort with ibuprofen and salon pas topical creams.  Has tried exercises, changing shoes, and OTC creams as above.      Relevant past medical, surgical, family and social history reviewed and updated as indicated. Interim medical history since our last visit reviewed. Allergies and medications reviewed and updated. Outpatient Medications Prior to Visit  Medication Sig Dispense Refill  . acetaminophen (TYLENOL) 325 MG tablet Take 650 mg by mouth every 6 (six) hours as needed for mild pain.     Marland Kitchen aspirin 81 MG tablet Take 81 mg by mouth daily.    Marland Kitchen atorvastatin (LIPITOR) 40 MG tablet Take 1 tablet (40 mg total) by mouth daily. 90 tablet 3  . butalbital-acetaminophen-caffeine (FIORICET) 50-325-40 MG tablet Take 1 tablet by mouth every 6 (six) hours as needed for headache. 10 tablet 1  . co-enzyme Q-10 30 MG capsule Take 30 mg by mouth daily.     . fluticasone (FLONASE) 50 MCG/ACT nasal spray USE 2 SPRAYS IN EACH NOSTRIL EVERY DAY 48 g 0  . furosemide (LASIX) 20 MG tablet TAKE 1 TABLET BY  MOUTH DAILY AS NEEDED FOR FLUID. 30 tablet 1  . methocarbamol (ROBAXIN) 500 MG tablet TAKE 1 TABLET (500 MG TOTAL) BY MOUTH 3 (THREE) TIMES DAILY AS NEEDED FOR MUSCLE SPASMS (SEDATION PRECAUTIONS). 60 tablet 1  . Multiple Vitamin (MULTIVITAMIN) tablet Take 1 tablet by mouth daily.    Marland Kitchen omeprazole (PRILOSEC) 20 MG capsule Take 1 capsule (20 mg total) by mouth daily. 90 capsule 3  . ondansetron (ZOFRAN ODT) 4 MG disintegrating tablet Take 1 tablet (4 mg total) by mouth every 8 (eight) hours as needed. 20 tablet 3  . topiramate (TOPAMAX) 25 MG tablet Take 3 tablets (75 mg total) by mouth at bedtime. 270 tablet 4  . triamcinolone cream (KENALOG) 0.1 % APPLY TO AFFECTED AREA TWICE A DAY 80 g 1  . vitamin B-12 (CYANOCOBALAMIN) 1000 MCG tablet Take 1 tablet (1,000 mcg total) by mouth every Monday, Wednesday, and Friday.    Marland Kitchen ibuprofen (IBU) 600 MG tablet Take 1 tablet (600 mg total) by mouth 2 (two) times daily as needed for headache (heel pain). 50 tablet 1   No facility-administered medications prior to visit.     Per HPI unless specifically indicated in ROS section below Review of Systems Objective:  BP 126/70   Pulse 64  Temp 98.2 F (36.8 C) (Temporal)   Ht 5\' 2"  (1.575 m)   Wt 168 lb 8 oz (76.4 kg)   SpO2 98%   BMI 30.82 kg/m   Wt Readings from Last 3 Encounters:  07/20/20 168 lb 8 oz (76.4 kg)  05/19/20 166 lb 8 oz (75.5 kg)  04/07/20 166 lb 3 oz (75.4 kg)      Physical Exam Vitals and nursing note reviewed.  Constitutional:      Appearance: Normal appearance. She is not ill-appearing.  Musculoskeletal:        General: Normal range of motion.     Comments:  2+ DP bilaterally L foot WNL R foot:  No ligament laxity No pain at achilles tendon, at heel, at base of 5th MT, at navicular or with percussion of malleoli  Discomfort to palpation of lateral ankle at ATFL Discomfort along lateral foot along peroneal tendon into insertion  Mild discomfort with resisted dorsiflexion,  moderate discomfort with resisted eversion of foot  Skin:    General: Skin is warm and dry.     Findings: No rash.  Neurological:     Mental Status: She is alert.       Results for orders placed or performed in visit on 04/20/20  HM DIABETES EYE EXAM  Result Value Ref Range   HM Diabetic Eye Exam No Retinopathy No Retinopathy   Assessment & Plan:  This visit occurred during the SARS-CoV-2 public health emergency.  Safety protocols were in place, including screening questions prior to the visit, additional usage of staff PPE, and extensive cleaning of exam room while observing appropriate contact time as indicated for disinfecting solutions.   Problem List Items Addressed This Visit    Pain in lateral portion of right ankle - Primary    Anticipate peroneal tendonitis - discussed this. Exercises from [facility] pt advisor handout provided rec continue ibuprofen PRN, add voltaren topically bid to tid.  Update if not improving, consider nitroglycerin patches vs SM eval. Pt agrees with plan.           Meds ordered this encounter  Medications  . ibuprofen (IBU) 600 MG tablet    Sig: Take 1 tablet (600 mg total) by mouth 2 (two) times daily as needed for headache (heel pain).    Dispense:  50 tablet    Refill:  1   No orders of the defined types were placed in this encounter.   Patient instructions: I think you have peroneal tendonitis. May use voltaren topical gel to the right foot at tender area. May use ibuprofen as needed. Do exercises provided today.  Let us know if not improving with this.   Follow up plan: Return if symptoms worsen or fail to improve.  [name], MD

## 2020-07-21 ENCOUNTER — Ambulatory Visit: Payer: Medicare HMO | Admitting: Family Medicine

## 2020-08-21 ENCOUNTER — Other Ambulatory Visit: Payer: Self-pay | Admitting: Family Medicine

## 2020-08-29 ENCOUNTER — Observation Stay: Payer: Medicare HMO

## 2020-08-29 ENCOUNTER — Emergency Department: Payer: Medicare HMO

## 2020-08-29 ENCOUNTER — Inpatient Hospital Stay
Admission: EM | Admit: 2020-08-29 | Discharge: 2020-09-01 | DRG: 065 | Disposition: A | Discharge status: Skilled Nursing Facility | Payer: Medicare HMO | Attending: Internal Medicine | Admitting: Internal Medicine

## 2020-08-29 ENCOUNTER — Encounter: Payer: Self-pay | Admitting: Emergency Medicine

## 2020-08-29 ENCOUNTER — Other Ambulatory Visit: Payer: Self-pay

## 2020-08-29 DIAGNOSIS — K219 Gastro-esophageal reflux disease without esophagitis: Secondary | ICD-10-CM | POA: Diagnosis not present

## 2020-08-29 DIAGNOSIS — I6339 Cerebral infarction due to thrombosis of other cerebral artery: Secondary | ICD-10-CM

## 2020-08-29 DIAGNOSIS — E119 Type 2 diabetes mellitus without complications: Secondary | ICD-10-CM | POA: Diagnosis present

## 2020-08-29 DIAGNOSIS — Z8249 Family history of ischemic heart disease and other diseases of the circulatory system: Secondary | ICD-10-CM | POA: Diagnosis not present

## 2020-08-29 DIAGNOSIS — Z9851 Tubal ligation status: Secondary | ICD-10-CM | POA: Diagnosis not present

## 2020-08-29 DIAGNOSIS — Z9049 Acquired absence of other specified parts of digestive tract: Secondary | ICD-10-CM

## 2020-08-29 DIAGNOSIS — Z7982 Long term (current) use of aspirin: Secondary | ICD-10-CM | POA: Diagnosis not present

## 2020-08-29 DIAGNOSIS — G43909 Migraine, unspecified, not intractable, without status migrainosus: Secondary | ICD-10-CM | POA: Diagnosis present

## 2020-08-29 DIAGNOSIS — R202 Paresthesia of skin: Secondary | ICD-10-CM

## 2020-08-29 DIAGNOSIS — I639 Cerebral infarction, unspecified: Secondary | ICD-10-CM | POA: Diagnosis present

## 2020-08-29 DIAGNOSIS — Z888 Allergy status to other drugs, medicaments and biological substances status: Secondary | ICD-10-CM

## 2020-08-29 DIAGNOSIS — I69398 Other sequelae of cerebral infarction: Secondary | ICD-10-CM | POA: Diagnosis not present

## 2020-08-29 DIAGNOSIS — Z83438 Family history of other disorder of lipoprotein metabolism and other lipidemia: Secondary | ICD-10-CM | POA: Diagnosis not present

## 2020-08-29 DIAGNOSIS — M6281 Muscle weakness (generalized): Secondary | ICD-10-CM | POA: Diagnosis not present

## 2020-08-29 DIAGNOSIS — I6381 Other cerebral infarction due to occlusion or stenosis of small artery: Principal | ICD-10-CM | POA: Diagnosis present

## 2020-08-29 DIAGNOSIS — I69359 Hemiplegia and hemiparesis following cerebral infarction affecting unspecified side: Secondary | ICD-10-CM | POA: Diagnosis present

## 2020-08-29 DIAGNOSIS — I69320 Aphasia following cerebral infarction: Secondary | ICD-10-CM | POA: Diagnosis not present

## 2020-08-29 DIAGNOSIS — E739 Lactose intolerance, unspecified: Secondary | ICD-10-CM | POA: Diagnosis present

## 2020-08-29 DIAGNOSIS — I69351 Hemiplegia and hemiparesis following cerebral infarction affecting right dominant side: Secondary | ICD-10-CM | POA: Diagnosis not present

## 2020-08-29 DIAGNOSIS — Z20822 Contact with and (suspected) exposure to covid-19: Secondary | ICD-10-CM | POA: Diagnosis present

## 2020-08-29 DIAGNOSIS — Z91048 Other nonmedicinal substance allergy status: Secondary | ICD-10-CM

## 2020-08-29 DIAGNOSIS — E1140 Type 2 diabetes mellitus with diabetic neuropathy, unspecified: Secondary | ICD-10-CM | POA: Diagnosis not present

## 2020-08-29 DIAGNOSIS — G8191 Hemiplegia, unspecified affecting right dominant side: Secondary | ICD-10-CM | POA: Diagnosis not present

## 2020-08-29 DIAGNOSIS — Z87442 Personal history of urinary calculi: Secondary | ICD-10-CM | POA: Diagnosis not present

## 2020-08-29 DIAGNOSIS — E7800 Pure hypercholesterolemia, unspecified: Secondary | ICD-10-CM | POA: Diagnosis present

## 2020-08-29 DIAGNOSIS — E785 Hyperlipidemia, unspecified: Secondary | ICD-10-CM | POA: Diagnosis present

## 2020-08-29 DIAGNOSIS — Z90710 Acquired absence of both cervix and uterus: Secondary | ICD-10-CM | POA: Diagnosis not present

## 2020-08-29 DIAGNOSIS — I10 Essential (primary) hypertension: Secondary | ICD-10-CM | POA: Diagnosis not present

## 2020-08-29 DIAGNOSIS — I700 Atherosclerosis of aorta: Secondary | ICD-10-CM | POA: Diagnosis not present

## 2020-08-29 DIAGNOSIS — Z79899 Other long term (current) drug therapy: Secondary | ICD-10-CM

## 2020-08-29 DIAGNOSIS — D649 Anemia, unspecified: Secondary | ICD-10-CM | POA: Diagnosis not present

## 2020-08-29 DIAGNOSIS — R29703 NIHSS score 3: Secondary | ICD-10-CM | POA: Diagnosis not present

## 2020-08-29 DIAGNOSIS — I6359 Cerebral infarction due to unspecified occlusion or stenosis of other cerebral artery: Secondary | ICD-10-CM | POA: Diagnosis not present

## 2020-08-29 DIAGNOSIS — G629 Polyneuropathy, unspecified: Secondary | ICD-10-CM | POA: Diagnosis not present

## 2020-08-29 DIAGNOSIS — R531 Weakness: Secondary | ICD-10-CM | POA: Diagnosis not present

## 2020-08-29 DIAGNOSIS — N200 Calculus of kidney: Secondary | ICD-10-CM | POA: Diagnosis not present

## 2020-08-29 DIAGNOSIS — R29898 Other symptoms and signs involving the musculoskeletal system: Secondary | ICD-10-CM | POA: Diagnosis not present

## 2020-08-29 LAB — BASIC METABOLIC PANEL
Anion gap: 10 (ref 5–15)
BUN: 15 mg/dL (ref 8–23)
CO2: 23 mmol/L (ref 22–32)
Calcium: 9.6 mg/dL (ref 8.9–10.3)
Chloride: 108 mmol/L (ref 98–111)
Creatinine, Ser: 0.74 mg/dL (ref 0.44–1.00)
GFR, Estimated: >60 mL/min (ref >60)
Glucose, Bld: 120 mg/dL — ABNORMAL HIGH (ref 70–99)
Potassium: 3.5 mmol/L (ref 3.5–5.1)
Sodium: 141 mmol/L (ref 135–145)

## 2020-08-29 LAB — CBC
HCT: 36.8 % (ref 36.0–46.0)
Hemoglobin: 12.4 g/dL (ref 12.0–15.0)
MCH: 31 pg (ref 26.0–34.0)
MCHC: 33.7 g/dL (ref 30.0–36.0)
MCV: 92 fL (ref 80.0–100.0)
Platelets: 211 10*3/uL (ref 150–400)
RBC: 4 MIL/uL (ref 3.87–5.11)
RDW: 13.5 % (ref 11.5–15.5)
WBC: 5.5 10*3/uL (ref 4.0–10.5)
nRBC: 0 % (ref 0.0–0.2)

## 2020-08-29 LAB — BRAIN NATRIURETIC PEPTIDE: B Natriuretic Peptide: 37.2 pg/mL (ref 0.0–100.0)

## 2020-08-29 LAB — RESP PANEL BY RT-PCR (FLU A&B, COVID) ARPGX2
Influenza A by PCR: NEGATIVE
Influenza B by PCR: NEGATIVE
SARS Coronavirus 2 by RT PCR: NEGATIVE

## 2020-08-29 LAB — URINALYSIS, COMPLETE (UACMP) WITH MICROSCOPIC
Bacteria, UA: NONE SEEN
Bilirubin Urine: NEGATIVE
Glucose, UA: NEGATIVE mg/dL
Hgb urine dipstick: NEGATIVE
Ketones, ur: NEGATIVE mg/dL
Leukocytes,Ua: NEGATIVE
Nitrite: NEGATIVE
Protein, ur: NEGATIVE mg/dL
Specific Gravity, Urine: 1.004 — ABNORMAL LOW (ref 1.005–1.030)
Squamous Epithelial / HPF: NONE SEEN (ref 0–5)
WBC, UA: NONE SEEN WBC/hpf (ref 0–5)
pH: 8 (ref 5.0–8.0)

## 2020-08-29 MED ORDER — ENOXAPARIN SODIUM 40 MG/0.4ML ~~LOC~~ SOLN
40.0000 mg | SUBCUTANEOUS | Status: DC
  Administered 2020-08-29 – 2020-09-01 (×4): 40 mg via SUBCUTANEOUS
  Filled 2020-08-29 (×4): qty 0.4

## 2020-08-29 MED ORDER — ACETAMINOPHEN 160 MG/5ML PO SOLN
650.0000 mg | ORAL | Status: DC | PRN
  Filled 2020-08-29: qty 20.3

## 2020-08-29 MED ORDER — BUTALBITAL-APAP-CAFFEINE 50-325-40 MG PO TABS: 1.0000 | ORAL_TABLET | Freq: Four times a day (QID) | ORAL | Status: DC | PRN

## 2020-08-29 MED ORDER — COENZYME Q10 30 MG PO CAPS: 30.0000 mg | ORAL_CAPSULE | Freq: Every day | ORAL | Status: DC

## 2020-08-29 MED ORDER — ADULT MULTIVITAMIN W/MINERALS CH
1.0000 | ORAL_TABLET | Freq: Every day | ORAL | Status: DC
  Administered 2020-08-29 – 2020-09-01 (×4): 1 via ORAL
  Filled 2020-08-29 (×4): qty 1

## 2020-08-29 MED ORDER — TOPIRAMATE 25 MG PO TABS
75.0000 mg | ORAL_TABLET | Freq: Every day | ORAL | Status: DC
  Administered 2020-08-29 – 2020-08-31 (×3): 75 mg via ORAL
  Filled 2020-08-29 (×4): qty 3

## 2020-08-29 MED ORDER — IOHEXOL 350 MG/ML SOLN
75.0000 mL | Freq: Once | INTRAVENOUS | Status: AC | PRN
  Administered 2020-08-29: 75 mL via INTRAVENOUS

## 2020-08-29 MED ORDER — ASPIRIN 300 MG RE SUPP: 300.0000 mg | Freq: Every day | RECTAL | Status: DC

## 2020-08-29 MED ORDER — VITAMIN B-12 1000 MCG PO TABS
1000.0000 ug | ORAL_TABLET | ORAL | Status: DC
  Administered 2020-08-31: 12:00:00 1000 ug via ORAL
  Filled 2020-08-29 (×2): qty 1

## 2020-08-29 MED ORDER — TRIAMCINOLONE ACETONIDE 0.1 % EX CREA
TOPICAL_CREAM | Freq: Two times a day (BID) | CUTANEOUS | Status: DC
  Filled 2020-08-29: qty 15

## 2020-08-29 MED ORDER — ONDANSETRON HCL 4 MG/2ML IJ SOLN: 4.0000 mg | Freq: Three times a day (TID) | INTRAMUSCULAR | Status: DC | PRN

## 2020-08-29 MED ORDER — STROKE: EARLY STAGES OF RECOVERY BOOK: Freq: Once | Status: AC

## 2020-08-29 MED ORDER — METHOCARBAMOL 500 MG PO TABS
500.0000 mg | ORAL_TABLET | Freq: Three times a day (TID) | ORAL | Status: DC | PRN
  Administered 2020-09-01: 500 mg via ORAL
  Filled 2020-08-29 (×2): qty 1

## 2020-08-29 MED ORDER — ACETAMINOPHEN 650 MG RE SUPP: 650.0000 mg | RECTAL | Status: DC | PRN

## 2020-08-29 MED ORDER — ONDANSETRON 4 MG PO TBDP
4.0000 mg | ORAL_TABLET | Freq: Three times a day (TID) | ORAL | Status: DC | PRN
  Administered 2020-08-29 – 2020-08-31 (×2): 4 mg via ORAL
  Filled 2020-08-29 (×3): qty 1

## 2020-08-29 MED ORDER — SENNOSIDES-DOCUSATE SODIUM 8.6-50 MG PO TABS: 1.0000 | ORAL_TABLET | Freq: Every evening | ORAL | Status: DC | PRN

## 2020-08-29 MED ORDER — ATORVASTATIN CALCIUM 20 MG PO TABS
40.0000 mg | ORAL_TABLET | Freq: Every day | ORAL | Status: DC
  Administered 2020-08-29 – 2020-08-31 (×3): 40 mg via ORAL
  Filled 2020-08-29 (×4): qty 2

## 2020-08-29 MED ORDER — ACETAMINOPHEN 325 MG PO TABS: 650.0000 mg | ORAL_TABLET | ORAL | Status: DC | PRN

## 2020-08-29 MED ORDER — ASPIRIN EC 325 MG PO TBEC
325.0000 mg | DELAYED_RELEASE_TABLET | Freq: Every day | ORAL | Status: DC
  Administered 2020-08-29: 10:00:00 325 mg via ORAL
  Filled 2020-08-29: qty 1

## 2020-08-29 MED ORDER — FLUTICASONE PROPIONATE 50 MCG/ACT NA SUSP
2.0000 | Freq: Every day | NASAL | Status: DC
  Administered 2020-08-29 – 2020-08-31 (×3): 2 via NASAL
  Filled 2020-08-29: qty 16

## 2020-08-29 MED ORDER — PANTOPRAZOLE SODIUM 40 MG PO TBEC
40.0000 mg | DELAYED_RELEASE_TABLET | Freq: Every day | ORAL | Status: DC
  Administered 2020-08-30 – 2020-09-01 (×3): 40 mg via ORAL
  Filled 2020-08-29 (×4): qty 1

## 2020-08-29 NOTE — Consult Note (Signed)
Neurology Consultation  Reason for Consult: right sided weakness Referring Physician: Dr Ivor Costa, hospitalist  CC: Right-sided weakness  History is obtained from: Patient  HPI: Dana [name] is a 75 y.o. female past medical history of complex migraine, diabetes, hyperlipidemia, prior history of migraines and complex migraines, presented to the emergency room for evaluation of right-sided weakness. She reports that she was in her usual state of health at about 2 PM on 08/28/2020 when she started noticing some tingling in her right arm and leg.  She did not make much of it, she was at a friend's place in the evening playing cards and the symptoms persisted.  She went to bed thinking that they would go away but due to the persistence of the symptoms and worsening to the point where she feels unstable and unsteady while walking due to right leg weakness, she came to the emergency room. She was evaluated by the ED providers, no red flags for LVO stroke-admitted to the hospitalist for further work-up. Denies any visual changes.  Denies any headache today. Has not had the complex migraine-like features without a headache in the past but has had a variety of symptoms with her headache. Sees Butler Denmark, NP at [facility] neurology as her outpatient neurology provider. Has been recommended to take Fioricet as needed, Benadryl as needed, and is on Topamax 25 AM and 50 p.m. for migraine prevention.   LKW: 2 PM on 08/28/2020 tpa given?: no, outside the window Premorbid modified Rankin scale (mRS): 0  ROS: Full ROS was performed and is negative except as noted in the HPI.   Past Medical History:  Diagnosis Date  . Allergy   . Anemia   . Arthritis   . Cataract   . Controlled type 2 diabetes mellitus without complication, without long-term current use of insulin (Hand)   . Diverticulitis   . History of chicken pox   . Hyperlipidemia   . Kidney stone   . 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 Migraine   .  Urge incontinence 04/29/2014    Family History  Problem Relation Age of Onset  . Heart disease Mother 70  . Hyperlipidemia Mother   . Hyperlipidemia Father   . Stroke Father   . Heart disease Father   . Diabetes Father   . Fibromyalgia Sister   . Hyperlipidemia Sister   . Heart disease Brother   . Diabetes Brother      Social History:   reports that she has never smoked. She has never used smokeless tobacco. She reports that she does not drink alcohol and does not use drugs.  Medications  Current Facility-Administered Medications:  .   stroke: mapping our early stages of recovery book, , Does not apply, Once, Ivor Costa, MD .  acetaminophen (TYLENOL) tablet 650 mg, 650 mg, Oral, Q4H PRN **OR** acetaminophen (TYLENOL) 160 MG/5ML solution 650 mg, 650 mg, Per Tube, Q4H PRN **OR** acetaminophen (TYLENOL) suppository 650 mg, 650 mg, Rectal, Q4H PRN, Ivor Costa, MD .  aspirin suppository 300 mg, 300 mg, Rectal, Daily **OR** aspirin EC tablet 325 mg, 325 mg, Oral, Daily, Ivor Costa, MD .  atorvastatin (LIPITOR) tablet 40 mg, 40 mg, Oral, Daily, Ivor Costa, MD .  butalbital-acetaminophen-caffeine (FIORICET) 50-325-40 MG per tablet 1 tablet, 1 tablet, Oral, Q6H PRN, Ivor Costa, MD .  enoxaparin (LOVENOX) injection 40 mg, 40 mg, Subcutaneous, Q24H, Niu, Xilin, MD .  fluticasone (FLONASE) 50 MCG/ACT nasal spray 2 spray, 2 spray, Each Nare, Daily,  Ivor Costa, MD .  methocarbamol (ROBAXIN) tablet 500 mg, 500 mg, Oral, TID PRN, Ivor Costa, MD .  multivitamin with minerals tablet 1 tablet, 1 tablet, Oral, Daily, Ivor Costa, MD .  ondansetron [facility]) injection 4 mg, 4 mg, Intravenous, Q8H PRN, Ivor Costa, MD .  pantoprazole (PROTONIX) EC tablet 40 mg, 40 mg, Oral, Daily, Ivor Costa, MD .  senna-docusate (Senokot-S) tablet 1 tablet, 1 tablet, Oral, QHS PRN, Ivor Costa, MD .  topiramate (TOPAMAX) tablet 75 mg, 75 mg, Oral, QHS, Ivor Costa, MD .  triamcinolone cream (KENALOG) 0.1 % cream, ,  Topical, BID, Ivor Costa, MD .  Derrill Memo ON 08/31/2020] vitamin B-12 (CYANOCOBALAMIN) tablet 1,000 mcg, 1,000 mcg, Oral, Q M,W,F, Ivor Costa, MD   Exam: Current vital signs: BP (!) 161/67 (BP Location: Right Arm)   Pulse 62   Temp 98.5 F (36.9 C) (Oral)   Resp 17   Ht 5\' 3"  (1.6 m)   Wt 77.1 kg   SpO2 100%   BMI 30.11 kg/m  Vital signs in last 24 hours: Temp:  [98.5 F (36.9 C)-98.6 F (37 C)] 98.5 F (36.9 C) (04/23 0806) Pulse Rate:  [60-69] 62 (04/23 0806) Resp:  [10-17] 17 (04/23 0806) BP: (135-161)/(66-82) 161/67 (04/23 0806) SpO2:  [95 %-100 %] 100 % (04/23 0806) Weight:  [77.1 kg] 77.1 kg (04/23 0550)  GENERAL: Awake, alert in NAD HEENT: - Normocephalic and atraumatic, dry mm, no LN++, no Thyromegally LUNGS - Clear to auscultation bilaterally with no wheezes CV - S1S2 RRR, no m/r/g, equal pulses bilaterally. ABDOMEN - Soft, nontender, nondistended with normoactive BS Ext: warm, well perfused, intact peripheral pulses, no edema  NEURO:  Mental Status: AA&Ox3  Language: speech is nondysarthric.  Naming, repetition, fluency, and comprehension intact. Cranial Nerves: PERRL. EOMI, visual fields full, no facial asymmetry, facial sensation intact, hearing intact, tongue/uvula/soft palate midline, normal sternocleidomastoid and trapezius muscle strength. No evidence of tongue atrophy or fibrillations Motor: Right upper and left upper extremity-5/5.  Right lower extremity is 4/5 with mild vertical drift.  Left lower extremity 5/5 strength. Tone: is normal and bulk is normal Sensation-diminished light touch on the right arm and leg in comparison to the left arm and leg Coordination: FTN intact bilaterally, no ataxia in BLE but slow to perform heel-knee-shin testing on the right. Gait- deferred  NIHSS-2   Labs I have reviewed labs in epic and the results pertinent to this consultation are:   CBC    Component Value Date/Time   WBC 5.5 08/29/2020 0552   RBC 4.00  08/29/2020 0552   HGB 12.4 08/29/2020 0552   HCT 36.8 08/29/2020 0552   PLT 211 08/29/2020 0552   MCV 92.0 08/29/2020 0552   MCH 31.0 08/29/2020 0552   MCHC 33.7 08/29/2020 0552   RDW 13.5 08/29/2020 0552   LYMPHSABS 1.6 04/22/2018 1325   MONOABS 0.5 04/22/2018 1325   EOSABS 0.0 04/22/2018 1325   BASOSABS 0.0 04/22/2018 1325    CMP     Component Value Date/Time   NA 141 08/29/2020 0552   K 3.5 08/29/2020 0552   CL 108 08/29/2020 0552   CO2 23 08/29/2020 0552   GLUCOSE 120 (H) 08/29/2020 0552   BUN 15 08/29/2020 0552   CREATININE 0.74 08/29/2020 0552   CALCIUM 9.6 08/29/2020 0552   PROT 7.1 10/01/2019 0909   ALBUMIN 4.2 10/01/2019 0909   AST 15 10/01/2019 0909   ALT 20 10/01/2019 0909   ALKPHOS 76 10/01/2019 0909   BILITOT  0.6 10/01/2019 0909   GFRNONAA >60 08/29/2020 0552   GFRAA >60 04/22/2018 1325    Lipid Panel     Component Value Date/Time   CHOL 143 10/01/2019 0909   TRIG 97.0 10/01/2019 0909   HDL 46.80 10/01/2019 0909   CHOLHDL 3 10/01/2019 0909   VLDL 19.4 10/01/2019 0909   LDLCALC 77 10/01/2019 0909     Imaging I have reviewed the images obtained:  CT-scan of the brain-chronic white matter changes and bilateral basal ganglia calcifications along with a lacunar infarct in the left basal ganglia which is unchanged from 2019.  Assessment: 75 year old with above past medical history with right arm and leg weakness without any facial involvement, no speech deficits on my exam has an NIH of 2 with right-sided arm and leg sensory deficits as well as right leg weakness. Given her history of complex migraines, complex migraines status in the differential but given her age and risk factors, lacunar infarct could also be the etiology of her presentation. She needs an MRI to further investigate this. I would have waited for the MRI before ordering the stroke risk factor work-up but the primary team is already gone ahead and ordered that.  Impression: Right-sided  weakness-evaluate for stroke versus complex migraine  Recommendations: MRI brain stat without contrast- if positive for stroke, complete full stroke work-up. If MRI brain negative for stroke, treat with migraine cocktail. Continue ASA 81 I will follow-up imaging further recommendations once the imaging is made available.  D/W Dr Blaine Hamper  -- Amie Portland, MD Neurologist [facility] Pager: [phone]  Addendum MRI brain completed- personally reviewed-shows a acute left thalamic infarct adjacent to the posterior limb of internal capsule.-Likely small vessel etiology.   Updated recommendations: Frequent neurochecks Telemetry A1c Lipid panel 2D echo CTA head and neck PT OT Speech therapy Continue aspirin for now-based on vessel imaging, will recommend if she needs dual antiplatelets Permissive hypertension-allow permissive hypertension to up to 220 and treat only on a as needed basis for the next 24 to 48 hours.  Long-term goal normotension. I will follow with you. Plan discussed with Dr. Blaine Hamper  -- Amie Portland, MD Neurologist [facility] Pager: [phone]

## 2020-08-29 NOTE — Evaluation (Signed)
Physical Therapy Evaluation Patient Details Name: Dana [name] MRN: [id number] DOB: 12-Jun-1945 Today's Date: 08/29/2020   History of Present Illness  Pt. is a 75 y.o. female who was admitted to [facility] with sided weakness. New L thalamic infarct noted, as well as previous and more numerous chronic lacunar infarcts.  PMHx includes: Migraines, DM, GERD, Lactose Intolerance, Kidney Stones, Diverticulitis, Anemia, Incontinence.  Clinical Impression  Pt was seen for mobility to practice getting OOB and transfers, and note RUE has weak grip and does not support as well as LUE.  Pt can walk with RW but may need to consider a hemiwalker at some point if walker looks too challenging. However, expecting a fairly fast recovery with good return of balance and R side strength.  Follow along for acute PT goals, to return to independence of gait and transfers.     Follow Up Recommendations Home health PT;Supervision for mobility/OOB    Equipment Recommendations  Rolling walker with 5" wheels    Recommendations for Other Services       Precautions / Restrictions Precautions Precautions: Fall Precaution Comments: R side weakness Restrictions Weight Bearing Restrictions: No      Mobility  Bed Mobility Overal bed mobility: Needs Assistance Bed Mobility: Supine to Sit     Supine to sit: Min assist     General bed mobility comments: min assist to manage R side weakness    Transfers Overall transfer level: Needs assistance Equipment used: Rolling walker (2 wheeled);1 person hand held assist Transfers: Sit to/from Stand Sit to Stand: Min assist         General transfer comment: pt required help to balance in standing  Ambulation/Gait Ambulation/Gait assistance: Min assist Gait Distance (Feet): 25 Feet Assistive device: Rolling walker (2 wheeled);1 person hand held assist Gait Pattern/deviations: Step-through pattern;Decreased stride length;Wide base of support;Decreased weight shift to  right Gait velocity: reduced Gait velocity interpretation: <1.31 ft/sec, indicative of household ambulator General Gait Details: RW with cues to turn and navigate obstacles  Stairs            Wheelchair Mobility    Modified Rankin (Stroke Patients Only) Modified Rankin (Stroke Patients Only) Pre-Morbid Rankin Score: No symptoms Modified Rankin: Moderately severe disability     Balance Overall balance assessment: Needs assistance Sitting-balance support: Feet supported Sitting balance-Leahy Scale: Good     Standing balance support: Bilateral upper extremity supported;During functional activity Standing balance-Leahy Scale: Poor                               Pertinent Vitals/Pain Pain Assessment: No/denies pain    Home Living Family/patient expects to be discharged to:: Private residence Living Arrangements: Spouse/significant other Available Help at Discharge: Family Type of Home: House Home Access: Stairs to enter Entrance Stairs-Rails: None (holds the door) Technical brewer of Steps: 1+1 Home Layout: Two level;Able to live on main level with bedroom/bathroom Home Equipment: Grab bars - tub/shower;Shower seat;Hand held shower head Additional Comments: has been independent to walk previously    Prior Function Level of Independence: Independent               Hand Dominance   Dominant Hand: Right    Extremity/Trunk Assessment   Upper Extremity Assessment Upper Extremity Assessment: Defer to OT evaluation    Lower Extremity Assessment Lower Extremity Assessment: Generalized weakness    Cervical / Trunk Assessment Cervical / Trunk Assessment: Normal  Communication   Communication: No  difficulties  Cognition Arousal/Alertness: Awake/alert Behavior During Therapy: WFL for tasks assessed/performed Overall Cognitive Status: Within Functional Limits for tasks assessed                                 General Comments:  pt is answering histotry questions well      General Comments General comments (skin integrity, edema, etc.): Improved control of balance on RW and with help could maneuver walker.  Unsafe alone, but feel she can maange with husband to help her    Exercises Other Exercises Other Exercises: RLE was WFL and LLE 4- strength   Assessment/Plan    PT Assessment Patient needs continued PT services  PT Problem List Decreased strength;Decreased activity tolerance;Decreased balance;Decreased coordination;Decreased safety awareness;Decreased knowledge of use of DME       PT Treatment Interventions DME instruction;Gait training;Stair training;Functional mobility training;Therapeutic activities;Therapeutic exercise;Balance training;Patient/family education;Neuromuscular re-education    PT Goals (Current goals can be found in the Care Plan section)  Acute Rehab PT Goals Patient Stated Goal: to walk alone PT Goal Formulation: With patient/family Time For Goal Achievement: 09/12/20 Potential to Achieve Goals: Good    Frequency 7X/week   Barriers to discharge Inaccessible home environment stairs to enter house    Co-evaluation               AM-PAC PT "6 Clicks" Mobility  Outcome Measure Help needed turning from your back to your side while in a flat bed without using bedrails?: A Little Help needed moving from lying on your back to sitting on the side of a flat bed without using bedrails?: A Little Help needed moving to and from a bed to a chair (including a wheelchair)?: A Little Help needed standing up from a chair using your arms (e.g., wheelchair or bedside chair)?: A Little Help needed to walk in hospital room?: A Little Help needed climbing 3-5 steps with a railing? : Total 6 Click Score: 16    End of Session Equipment Utilized During Treatment: Gait belt Activity Tolerance: Patient tolerated treatment well;Treatment limited secondary to medical complications  (Comment) Patient left: in chair;with call bell/phone within reach;with chair alarm set;with family/visitor present Nurse Communication: Mobility status PT Visit Diagnosis: Muscle weakness (generalized) (M62.81);Difficulty in walking, not elsewhere classified (R26.2);Hemiplegia and hemiparesis Hemiplegia - Right/Left: Right Hemiplegia - dominant/non-dominant: Dominant Hemiplegia - caused by: Cerebral infarction    Time: 1345-1420 PT Time Calculation (min) (ACUTE ONLY): 35 min   Charges:   PT Evaluation $PT Eval Moderate Complexity: 1 Mod PT Treatments $Gait Training: 8-22 mins       Ramond Dial 08/29/2020, 10:18 PM Mee Hives, PT MS Acute Rehab Dept. Number: [place] [facility]

## 2020-08-29 NOTE — ED Notes (Signed)
Dr.Nui at bedside. 

## 2020-08-29 NOTE — ED Triage Notes (Signed)
Pt to room untriaged with c/o right side weakness and lips "feeling tingly", 1 day of diarrhea,   Pt with appreciable weakness to right lower extremity  Pt denies smoking hx or HTN  Pt reports symptoms started at 1700 yesterday, reports a similar episode approx 2 years ago that was dx as "complicated migraine"

## 2020-08-29 NOTE — ED Notes (Signed)
Pt resting comfortably at this time and asks to use restroom, pt states she has been walking to bedside toilet with assistance. Pt did walk with steady gait but has a shuffle and states this is new and why she is here.

## 2020-08-29 NOTE — Evaluation (Addendum)
Occupational Therapy Evaluation Patient Details Name: Dana [name] MRN: [id number] DOB: April 23, 1946 Today's Date: 08/29/2020    History of Present Illness Pt. is a 75 y.o. female who was admitted to [facility] with sided weakness. PMHx includes: Migraines, DM, GERD, Lactose Intolerance, Kidney Stones, Diverticulitis, Anemia, Incontinence.   Clinical Impression   Pt. presents with weakness, impaired right hand [facility] skills. limited balance, and limited functional mobility which impacts ADL and IADL functioning. Pt. resides at home with her husband. Pt. was independent with ADLs, and IADL functioning prior to admission. Pt. reports tingling in the right hand. RUE strength 4/5, impaired UE motor control for tasks requiring sustained RUE elevation, and decreased [facility] skills. Pt. required CGA for functional mobility during ADL/IADL tasks within the room. Pt. could benefit from OT services for ADL training, neuromuscular re-education, there. Ex., and pt. education about home modification, and DME. Pt. could benefit from follow-up outpatient OT services.    Follow Up Recommendations  Outpatient OT    Equipment Recommendations    None   Recommendations for Other Services       Precautions / Restrictions        Mobility Bed Mobility Overal bed mobility: Independent                  Transfers Overall transfer level: Needs assistance   Transfers: Sit to/from Stand Sit to Stand: Supervision         General transfer comment: CGA walking to the sink, and backing up to the bed    Balance Overall balance assessment: Needs assistance Sitting-balance support: No upper extremity supported Sitting balance-Leahy Scale: Good     Standing balance support: No upper extremity supported Standing balance-Leahy Scale: Fair Standing balance comment: dynamic standing balance                           ADL either performed or assessed with clinical judgement   ADL Overall ADL's : Needs  assistance/impaired Eating/Feeding: Independent;Set up   Grooming: Independent;Set up           Upper Body Dressing : Set up;Independent   Lower Body Dressing: Set up;Supervision/safety               Functional mobility during ADLs: Supervision/safety       Vision Baseline Vision/History: Cataracts (Anticipated surgery 09/22/20) Patient Visual Report: No change from baseline       Perception     Praxis      Pertinent Vitals/Pain       Hand Dominance Right   Extremity/Trunk Assessment Upper Extremity Assessment Upper Extremity Assessment: RUE deficits/detail RUE Sensation: decreased light touch RUE Coordination: decreased fine motor           Communication Communication Communication: No difficulties   Cognition Arousal/Alertness: Awake/alert Behavior During Therapy: WFL for tasks assessed/performed Overall Cognitive Status: Within Functional Limits for tasks assessed                                     General Comments       Exercises     Shoulder Instructions      Home Living Family/patient expects to be discharged to:: Private residence Living Arrangements: Spouse/significant other Available Help at Discharge: Family Type of Home: House       Home Layout: Two level Alternate Level Stairs-Number of Steps: 1  Home Equipment: Grab bars - tub/shower;Shower seat;Hand held shower head          Prior Functioning/Environment Level of Independence: Independent                 OT Problem List: Decreased strength;Decreased coordination;Impaired UE functional use;Impaired balance (sitting and/or standing)      OT Treatment/Interventions: DME and/or AE instruction;Therapeutic activities;Neuromuscular education;Therapeutic exercise    OT Goals(Current goals can be found in the care plan section) Acute Rehab OT Goals Patient Stated Goal: To regain independence OT Goal Formulation: With patient Time For  Goal Achievement: 09/11/20 Potential to Achieve Goals: Good  OT Frequency:     Barriers to D/C:            Co-evaluation              AM-PAC OT "6 Clicks" Daily Activity     Outcome Measure Help from another person eating meals?: None Help from another person taking care of personal grooming?: None Help from another person toileting, which includes using toliet, bedpan, or urinal?: A Little Help from another person bathing (including washing, rinsing, drying)?: A Little Help from another person to put on and taking off regular upper body clothing?: None Help from another person to put on and taking off regular lower body clothing?: A Little 6 Click Score: 21   End of Session Equipment Utilized During Treatment: Gait belt  Activity Tolerance: Patient tolerated treatment well Patient left: in bed;with call bell/phone within reach;with bed alarm set  OT Visit Diagnosis: Unsteadiness on feet (R26.81);Muscle weakness (generalized) (M62.81)                Time: 6389-3734 OT Time Calculation (min): 33 min Charges:  OT General Charges $OT Visit: 1 Visit OT Evaluation $OT Eval Moderate Complexity: 1 Mod  {Vence Lalor, MS, OTR/L  Harrel Carina 08/29/2020, 12:50 PM

## 2020-08-29 NOTE — ED Notes (Signed)
Patient transported to CT 

## 2020-08-29 NOTE — Progress Notes (Signed)
SLP Cancellation Note  Patient Details Name: Dana [name] MRN: [id number] DOB: 1946/04/26   Cancelled treatment:       Reason Eval/Treat Not Completed: SLP screened, no needs identified, will sign off (chart reviewed; consulted NSG then met w/ pt/spouse). Pt denied any difficulty swallowing and is now ordered a regular diet after passing the [place] swallow screen; tolerates swallowing pills w/ water per NSG. Pt conversed at conversational level w/out deficits noted; pt and Spouse denied any speech-language deficits. Pt reading the menu making choices for her breakfast meal.  No further skilled ST services indicated as pt appears at her baseline. Pt agreed. NSG to reconsult if any change in status while admitted.      Orinda Kenner, MS, CCC-SLP Speech Language Pathologist Rehab Services [phone] [facility] 08/29/2020, 8:37 AM

## 2020-08-29 NOTE — H&P (Addendum)
History and Physical    [name] KWI:[id number] DOB: 06/30/1945 DOA: 08/29/2020  Referring MD/NP/PA:   PCP: [name], MD   Patient coming from:  The patient is coming from home.  At baseline, pt is independent for most of ADL.        Chief Complaint: right sided weakness  HPI: Dana [name] is a 75 y.o. female with medical history significant of migraine, HLD, DM, GERD, lactose intolerance, kidney stone (s/p lithotripsy), diverticulitis, anemia, GERD incontinence, memory loss, who presents with right-sided weakness.  Patient states that around 7:00 last night she began feeling weak and heavy in right leg. Patient states that symptoms were mild before she went to sleep last night, but when she woke up she had worsening weakness in the right leg, also started feeling weak in right arm and tingling in fingers of right hand. Patient does not have vision loss or hearing loss.  No facial droop or slurred speech. Patient does not have chest pain, cough, shortness of breath.  No fever or chills.  No nausea, vomiting, diarrhea or abdominal pain.  No symptoms of UTI.  Patient denies any headache currently.  ED Course: pt was found to have WBC 5.5, negative urinalysis, pending COVID PCR, electrolytes renal function okay, temperature normal, blood pressure 148/72, heart rate of 69, RR 14, oxygen saturation 100% on room air.  CT of head is negative for acute intracranial abnormalities.  Patient is placed on MedSurg bed for observation, Dr. Rory Percy of neurology is consulted  Review of Systems:   General: no fevers, chills, no body weight gain, has fatigue HEENT: no blurry vision, hearing changes or sore throat Respiratory: no dyspnea, coughing, wheezing CV: no chest pain, no palpitations GI: no nausea, vomiting, abdominal pain, diarrhea, constipation GU: no dysuria, burning on urination, increased urinary frequency, hematuria  Ext: no leg edema Neuro:  no vision change or hearing loss. Has  left-sided weakness and tingling in right fingers. Skin: no rash, no skin tear. MSK: No muscle spasm, no deformity, no limitation of range of movement in spin Heme: No easy bruising.  Travel history: No recent long distant travel.  Allergy:  Allergies  Allergen Reactions  . Amitriptyline Other (See Comments)    Worsened migraine and confusion  . Oxybutynin Other (See Comments)    Urinary retention/hesitancy  . Tape Other (See Comments)    "bandaids make my skin red"  . Tramadol Other (See Comments)    Causes confusion and dizziness  . Niacin And Related Itching  . Septra [Sulfamethoxazole-Trimethoprim] Itching    Past Medical History:  Diagnosis Date  . Allergy   . Anemia   . Arthritis   . Cataract   . Controlled type 2 diabetes mellitus without complication, without long-term current use of insulin ([place])   . Diverticulitis   . History of chicken pox   . Hyperlipidemia   . Kidney stone   . 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 Migraine   . Urge incontinence 04/29/2014    Past Surgical History:  Procedure Laterality Date  . ABDOMINAL HYSTERECTOMY  2006   cervix remains, menorrhagia  . APPENDECTOMY  1970  . [place]   biopsy-left  . BREAST SURGERY  1995   right milk duct removed  . CARPAL TUNNEL RELEASE Right 02/2017  . CHOLECYSTECTOMY  1971  . COLONOSCOPY  11/2005   diverticulosis o/w WNL Dana [name])  . DEXA  2015   spine -0.4, hip -1.0 WNL  .  LITHOTRIPSY    . LUMBAR EPIDURAL INJECTION Left 10/2017   transforaminal L L3/4 (Dr Maryjean Ka)  . TUBAL LIGATION      Social History:  reports that she has never smoked. She has never used smokeless tobacco. She reports that she does not drink alcohol and does not use drugs.  Family History:  Family History  Problem Relation Age of Onset  . Heart disease Mother 9  . Hyperlipidemia Mother   . Hyperlipidemia Father   . Stroke Father   . Heart disease Father   . Diabetes Father   . Fibromyalgia Sister   .  Hyperlipidemia Sister   . Heart disease Brother   . Diabetes Brother      Prior to Admission medications   Medication Sig Start Date End Date Taking? Authorizing Provider  acetaminophen (TYLENOL) 325 MG tablet Take 650 mg by mouth every 6 (six) hours as needed for mild pain.     [provider]  aspirin 81 MG tablet Take 81 mg by mouth daily.    [provider]  atorvastatin (LIPITOR) 40 MG tablet TAKE 1 TABLET (40 MG TOTAL) BY MOUTH DAILY. 08/24/20   [name], MD  butalbital-acetaminophen-caffeine [name]) [phone] MG tablet Take 1 tablet by mouth every 6 (six) hours as needed for headache. 10/28/19   Suzzanne Cloud, NP  co-enzyme Q-10 30 MG capsule Take 30 mg by mouth daily.     [provider]  fluticasone Asencion Islam) 50 MCG/ACT nasal spray USE 2 SPRAYS IN EACH NOSTRIL EVERY DAY 11/29/19   [name], MD  furosemide (LASIX) 20 MG tablet TAKE 1 TABLET BY MOUTH DAILY AS NEEDED FOR FLUID. 04/26/18   [name], MD  ibuprofen (IBU) 600 MG tablet Take 1 tablet (600 mg total) by mouth 2 (two) times daily as needed for headache (heel pain). 07/20/20   [name], MD  methocarbamol (ROBAXIN) 500 MG tablet TAKE 1 TABLET (500 MG TOTAL) BY MOUTH 3 (THREE) TIMES DAILY AS NEEDED FOR MUSCLE SPASMS (SEDATION PRECAUTIONS). 01/03/20   [name], MD  Multiple Vitamin (MULTIVITAMIN) tablet Take 1 tablet by mouth daily.    [provider]  omeprazole (PRILOSEC) 20 MG capsule Take 1 capsule (20 mg total) by mouth daily. 10/04/19   [name], MD  ondansetron (ZOFRAN ODT) 4 MG disintegrating tablet Take 1 tablet (4 mg total) by mouth every 8 (eight) hours as needed. 04/07/20   [name], MD  topiramate (TOPAMAX) 25 MG tablet Take 3 tablets (75 mg total) by mouth at bedtime. 10/28/19   Suzzanne Cloud, NP  triamcinolone cream (KENALOG) 0.1 % APPLY TO AFFECTED AREA TWICE A DAY 12/18/19   [name], MD  vitamin B-12  (CYANOCOBALAMIN) 1000 MCG tablet Take 1 tablet (1,000 mcg total) by mouth every Monday, Wednesday, and Friday. 09/26/18   [name], MD    Physical Exam: Vitals:   08/29/20 0647 08/29/20 0700 08/29/20 0806 08/29/20 1020  BP: (!) 148/72 135/66 (!) 161/67 (!) 142/59  Pulse: 60 65 62 64  Resp: 14 10 17    Temp:  98.6 F (37 C) 98.5 F (36.9 C) 97.7 F (36.5 C)  TempSrc:  Oral Oral   SpO2: 100% 95% 100% 100%  Weight:      Height:       General: Not in acute distress HEENT:       Eyes: PERRL, EOMI, no scleral icterus.       ENT: No discharge from the ears and nose, no pharynx  injection, no tonsillar enlargement.        Neck: No JVD, no bruit, no mass felt. Heme: No neck lymph node enlargement. Cardiac: S1/S2, RRR, No murmurs, No gallops or rubs. Respiratory: No rales, wheezing, rhonchi or rubs. GI: Soft, nondistended, nontender, no rebound pain, no organomegaly, BS present. GU: No hematuria Ext: No pitting leg edema bilaterally. 1+DP/PT pulse bilaterally. Musculoskeletal: No joint deformities, No joint redness or warmth, no limitation of ROM in spin. Skin: No rashes.  Neuro: Alert, oriented X3, cranial nerves II-XII grossly intact, moves all extremities normally. Muscle strength 5/5 in all extremities, sensation to light touch intact. Brachial reflex 2+ bilaterally. Psych: Patient is not psychotic, no suicidal or hemocidal ideation.  Labs on Admission: I have personally reviewed following labs and imaging studies  CBC: Recent Labs  Lab 08/29/20 0552  WBC 5.5  HGB 12.4  HCT 36.8  MCV 92.0  PLT 123456   Basic Metabolic Panel: Recent Labs  Lab 08/29/20 0552  NA 141  K 3.5  CL 108  CO2 23  GLUCOSE 120*  BUN 15  CREATININE 0.74  CALCIUM 9.6   GFR: Estimated Creatinine Clearance: 61.6 mL/min (by C-G formula based on SCr of 0.74 mg/dL). Liver Function Tests: No results for input(s): AST, ALT, ALKPHOS, BILITOT, PROT, ALBUMIN in the last 168 hours. No results for  input(s): LIPASE, AMYLASE in the last 168 hours. No results for input(s): AMMONIA in the last 168 hours. Coagulation Profile: No results for input(s): INR, PROTIME in the last 168 hours. Cardiac Enzymes: No results for input(s): CKTOTAL, CKMB, CKMBINDEX, TROPONINI in the last 168 hours. BNP (last 3 results) No results for input(s): PROBNP in the last 8760 hours. HbA1C: No results for input(s): HGBA1C in the last 72 hours. CBG: No results for input(s): GLUCAP in the last 168 hours. Lipid Profile: No results for input(s): CHOL, HDL, LDLCALC, TRIG, CHOLHDL, LDLDIRECT in the last 72 hours. Thyroid Function Tests: No results for input(s): TSH, T4TOTAL, FREET4, T3FREE, THYROIDAB in the last 72 hours. Anemia Panel: No results for input(s): VITAMINB12, FOLATE, FERRITIN, TIBC, IRON, RETICCTPCT in the last 72 hours. Urine analysis:    Component Value Date/Time   COLORURINE COLORLESS (A) 08/29/2020 0645   APPEARANCEUR CLEAR (A) 08/29/2020 0645   LABSPEC 1.004 (L) 08/29/2020 0645   PHURINE 8.0 08/29/2020 0645   GLUCOSEU NEGATIVE 08/29/2020 0645   HGBUR NEGATIVE 08/29/2020 0645   BILIRUBINUR NEGATIVE 08/29/2020 0645   BILIRUBINUR negative 09/04/2017 1328   KETONESUR NEGATIVE 08/29/2020 0645   PROTEINUR NEGATIVE 08/29/2020 0645   UROBILINOGEN 0.2 09/04/2017 1328   UROBILINOGEN 0.2 11/13/2014 1005   NITRITE NEGATIVE 08/29/2020 0645   LEUKOCYTESUR NEGATIVE 08/29/2020 0645   Sepsis Labs: @LABRCNTIP (procalcitonin:4,lacticidven:4) ) Recent Results (from the past 240 hour(s))  Resp Panel by RT-PCR (Flu A&B, Covid) Urine, Clean Catch     Status: None   Collection Time: 08/29/20  6:45 AM   Specimen: Urine, Clean Catch; Nasopharyngeal(NP) swabs in vial transport medium  Result Value Ref Range Status   SARS Coronavirus 2 by RT PCR NEGATIVE NEGATIVE Final    Comment: (NOTE) SARS-CoV-2 target nucleic acids are NOT DETECTED.  The SARS-CoV-2 RNA is generally detectable in upper  respiratory specimens during the acute phase of infection. The lowest concentration of SARS-CoV-2 viral copies this assay can detect is 138 copies/mL. A negative result does not preclude SARS-Cov-2 infection and should not be used as the sole basis for treatment or other patient management decisions. A negative result may occur with  improper specimen collection/handling, submission of specimen other than nasopharyngeal swab, presence of viral mutation(s) within the areas targeted by this assay, and inadequate number of viral copies(<138 copies/mL). A negative result must be combined with clinical observations, patient history, and epidemiological information. The expected result is Negative.  Fact Sheet for Patients:  EntrepreneurPulse.com.au  Fact Sheet for Healthcare Providers:  IncredibleEmployment.be  This test is no t yet approved or cleared by the [place] FDA and  has been authorized for detection and/or diagnosis of SARS-CoV-2 by FDA under an Emergency Use Authorization (EUA). This EUA will remain  in effect (meaning this test can be used) for the duration of the COVID-19 declaration under Section 564(b)(1) of the Act, 21 U.S.C.section 360bbb-3(b)(1), unless the authorization is terminated  or revoked sooner.       Influenza A by PCR NEGATIVE NEGATIVE Final   Influenza B by PCR NEGATIVE NEGATIVE Final    Comment: (NOTE) The Xpert Xpress SARS-CoV-2/FLU/RSV plus assay is intended as an aid in the diagnosis of influenza from Nasopharyngeal swab specimens and should not be used as a sole basis for treatment. Nasal washings and aspirates are unacceptable for Xpert Xpress SARS-CoV-2/FLU/RSV testing.  Fact Sheet for Patients: EntrepreneurPulse.com.au  Fact Sheet for Healthcare Providers: IncredibleEmployment.be  This test is not yet approved or cleared by the [place] FDA and has been  authorized for detection and/or diagnosis of SARS-CoV-2 by FDA under an Emergency Use Authorization (EUA). This EUA will remain in effect (meaning this test can be used) for the duration of the COVID-19 declaration under Section 564(b)(1) of the Act, 21 U.S.C. section 360bbb-3(b)(1), unless the authorization is terminated or revoked.  Performed at [facility], [address] [place], [place] [address]      Radiological Exams on Admission: CT Head Wo Contrast  Result Date: 08/29/2020 CLINICAL DATA:  Stroke suspected. No aero deficit. Right side weakness and lips healing tingly for 1 day. Right lower extremity weakness EXAM: CT HEAD WITHOUT CONTRAST TECHNIQUE: Contiguous axial images were obtained from the base of the skull through the vertex without intravenous contrast. COMPARISON:  CT head 04/22/2018 FINDINGS: Brain: Patchy and confluent areas of decreased attenuation are noted throughout the deep and periventricular white matter of the cerebral hemispheres bilaterally, compatible with chronic microvascular ischemic disease. No evidence of large-territorial acute infarction. No parenchymal hemorrhage. No mass lesion. No extra-axial collection. No mass effect or midline shift. No hydrocephalus. Basilar cisterns are patent. Vascular: No hyperdense vessel. Atherosclerotic calcifications are present within the cavernous internal carotid arteries. Skull: No acute fracture or focal lesion. Sinuses/Orbits: Paranasal sinuses and mastoid air cells are clear. The orbits are unremarkable. Other: None. IMPRESSION: No acute intracranial abnormality. Electronically Signed   By: Iven Finn M.D.   On: 08/29/2020 06:25   MR BRAIN WO CONTRAST  Result Date: 08/29/2020 CLINICAL DATA:  Right-sided weakness. EXAM: MRI HEAD WITHOUT CONTRAST TECHNIQUE: Multiplanar, multiecho pulse sequences of the brain and surrounding structures were obtained without intravenous contrast. COMPARISON:  Head CT  08/29/2020 and MRI 05/20/2017 FINDINGS: Brain: There is a 9 mm acute infarct laterally in the left thalamus. Chronic microhemorrhages are noted in the right thalamus, pons, and left periventricular white matter and may be secondary to chronic hypertension. Patchy T2 hyperintensities in the cerebral white matter bilaterally are similar to the prior MRI and are nonspecific but compatible with moderate chronic small vessel ischemic disease. Chronic bilateral basal ganglia lacunar infarcts are unchanged from the prior MRI. Chronic lacunar infarcts in the  left thalamus and left pons are new. The ventricles and sulci are within normal limits for age. No mass, midline shift, or extra-axial fluid collection is identified. Vascular: Major intracranial vascular flow voids are preserved. Skull and upper cervical spine: Unremarkable bone marrow signal. Sinuses/Orbits: Unremarkable orbits. Paranasal sinuses and mastoid air cells are clear. Other: None. IMPRESSION: 1. Acute left thalamic infarct. 2. Moderate chronic small vessel ischemic disease with chronic lacunar infarcts as above, increased in number from 2019. Electronically Signed   By: Logan Bores M.D.   On: 08/29/2020 12:43     EKG: I have personally reviewed.  Sinus rhythm, QTC 427, low voltage, nonspecific T wave change  Assessment/Plan Principal Problem:   Right sided weakness Active Problems:   Migraine   GERD (gastroesophageal reflux disease)   Hyperlipidemia   Diabetes mellitus without complication (HCC)   Stroke (HCC)   Right sided weakness: Patient has right-sided weakness, tingling in fingers of right hand.  CT head is negative for acute intracranial abnormalities.  Differential diagnoses include TIA, stroke and complicated migraine.  Neurology, Dr. Rory Percy is consulted, who recommended to get MRI of brain first. If MRI of brain is positive for stroke, will do full stroke work up.  - Placed on MedSurg bed for observation - Obtain MRI - ASA    - lipitor - fasting lipid panel and HbA1c  - swallowing screen. If fails, will get SLP - PT/OT consult  Addendum_ Stroke: MRI of brain showed cute left thalamic infarct, moderate chronic small vessel ischemic disease with chronic lacunar infarcts as above, increased in number from 2019 -will get 2d echo -CTA of head and neck ordered by Dr. Rory Percy -permissive HTN (pt dose not have hx of HTN)  Migraine: Currently no headache. -Continue home topamax and prn Floricet   GERD (gastroesophageal reflux disease) -protonix  Hyperlipidemia -Lipitor  Diet controlled diabetes mellitus without complication ([place]): Recent A1c 6.1, well controlled.  Patient is not taking medications currently.  Blood sugar 120. -Follow-up CBG every morning     DVT ppx: SQ Lovenox Code Status: Full code Family Communication:  Yes, patient's husband   at bed side Disposition Plan:  Anticipate discharge back to previous environment Consults called:  Dr. Rory Percy Admission status and Level of care: Med-Surg:   for obs   Status is: Observation  The patient remains OBS appropriate and will d/c before 2 midnights.  Dispo: The patient is from: Home              Anticipated d/c is to: Home              Patient currently is not medically stable to d/c.   Difficult to place patient No           Date of Service 08/29/2020    [facility]   If 7PM-7AM, please contact night-coverage [email or web address] 08/29/2020, 1:11 PM

## 2020-08-29 NOTE — ED Provider Notes (Addendum)
[facility] Emergency Department Provider Note   ____________________________________________   Event Date/Time   First MD Initiated Contact with Patient 08/29/20 [phone]     (approximate)  I have reviewed the triage vital signs and the nursing notes.   HISTORY  Chief Complaint Weakness    HPI Dana [name] is a 75 y.o. female with the below stated past medical history who presents via private vehicle from home complaining of right-sided weakness.  Patient states that around 7:00 last night she began having right lower extremity heaviness and feeling that she could not pick it up to walk.  Patient states that it was minimal symptoms before she went to sleep last night but when she woke up she had worsening weakness in the right lower extremity as well as paresthesias and weak grip in the right upper extremity.  Patient states that symptoms have remained stable since she awoke this morning.  Patient currently denies any vision changes, tinnitus, difficulty speaking, facial droop, sore throat, chest pain, shortness of breath, abdominal pain, nausea/vomiting/diarrhea, dysuria        Past Medical History:  Diagnosis Date  . Allergy   . Anemia   . Arthritis   . Cataract   . Controlled type 2 diabetes mellitus without complication, without long-term current use of insulin ([place])   . Diverticulitis   . History of chicken pox   . Hyperlipidemia   . Kidney stone   . 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 Migraine   . Urge incontinence 04/29/2014    Patient Active Problem List   Diagnosis Date Noted  . Right sided weakness 08/29/2020  . Hyperlipidemia   . Diabetes mellitus without complication ([place])   . Pain in lateral portion of right ankle 07/20/2020  . Plantar fasciitis of right foot 05/19/2020  . Small vessel disease, cerebrovascular   . Pes anserine bursitis 04/09/2019  . Skin rash 04/09/2019  . Red skin 02/25/2019  . Overactive bladder 10/24/2018  .  Amnesia memory loss 05/19/2017  . Obesity, Class I, BMI 30-34.9 05/09/2017  . Lumbar herniated disc 02/01/2017  . Left shoulder pain 09/30/2016  . Medicare annual wellness visit, subsequent 05/03/2016  . 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 History of kidney stones   . Cervical pain (neck) 12/04/2015  . Health maintenance examination 04/29/2014  . Paresthesia 04/29/2014  . Advanced care planning/counseling discussion 04/29/2014  . PFD (pelvic floor dysfunction) 04/29/2014  . Controlled type 2 diabetes mellitus without complication, without long-term current use of insulin ([place]) 12/24/2013  . 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  . Hx of diverticulitis of colon 11/07/2012  . Allergic rhinitis 11/07/2012  . Hyperlipidemia associated with type 2 diabetes mellitus ([place]) 11/07/2012  . Migraine 11/07/2012  . Hereditary and idiopathic peripheral neuropathy 11/07/2012  . LGSIL Pap smear of vaginal cuff 11/07/2012  . GERD (gastroesophageal reflux disease) 11/07/2012  . Family history of premature coronary heart disease 11/07/2012    Past Surgical History:  Procedure Laterality Date  . ABDOMINAL HYSTERECTOMY  2006   cervix remains, menorrhagia  . APPENDECTOMY  1970  . [place]   biopsy-left  . BREAST SURGERY  1995   right milk duct removed  . CARPAL TUNNEL RELEASE Right 02/2017  . CHOLECYSTECTOMY  1971  . COLONOSCOPY  11/2005   diverticulosis o/w WNL [facility])  . DEXA  2015   spine -0.4, hip -1.0 WNL  . LITHOTRIPSY    . LUMBAR EPIDURAL INJECTION Left 10/2017  transforaminal L L3/4 (Dr Maryjean Ka)  . TUBAL LIGATION      Prior to Admission medications   Medication Sig Start Date End Date Taking? Authorizing Provider  acetaminophen (TYLENOL) 325 MG tablet Take 650 mg by mouth every 6 (six) hours as needed for mild pain.     [provider]  aspirin 81 MG tablet Take 81 mg by mouth daily.    [provider]  atorvastatin (LIPITOR) 40 MG tablet TAKE 1 TABLET (40  MG TOTAL) BY MOUTH DAILY. 08/24/20   [name], MD  butalbital-acetaminophen-caffeine [facility]) [phone] MG tablet Take 1 tablet by mouth every 6 (six) hours as needed for headache. 10/28/19   Suzzanne Cloud, NP  co-enzyme Q-10 30 MG capsule Take 30 mg by mouth daily.     [provider]  fluticasone Asencion Islam) 50 MCG/ACT nasal spray USE 2 SPRAYS IN EACH NOSTRIL EVERY DAY 11/29/19   [name], MD  furosemide (LASIX) 20 MG tablet TAKE 1 TABLET BY MOUTH DAILY AS NEEDED FOR FLUID. 04/26/18   [name], MD  ibuprofen (IBU) 600 MG tablet Take 1 tablet (600 mg total) by mouth 2 (two) times daily as needed for headache (heel pain). 07/20/20   [name], MD  methocarbamol (ROBAXIN) 500 MG tablet TAKE 1 TABLET (500 MG TOTAL) BY MOUTH 3 (THREE) TIMES DAILY AS NEEDED FOR MUSCLE SPASMS (SEDATION PRECAUTIONS). 01/03/20   [name], MD  Multiple Vitamin (MULTIVITAMIN) tablet Take 1 tablet by mouth daily.    [provider]  omeprazole (PRILOSEC) 20 MG capsule Take 1 capsule (20 mg total) by mouth daily. 10/04/19   [name], MD  ondansetron (ZOFRAN ODT) 4 MG disintegrating tablet Take 1 tablet (4 mg total) by mouth every 8 (eight) hours as needed. 04/07/20   [name], MD  topiramate (TOPAMAX) 25 MG tablet Take 3 tablets (75 mg total) by mouth at bedtime. 10/28/19   Suzzanne Cloud, NP  triamcinolone cream (KENALOG) 0.1 % APPLY TO AFFECTED AREA TWICE A DAY 12/18/19   [name], MD  vitamin B-12 (CYANOCOBALAMIN) 1000 MCG tablet Take 1 tablet (1,000 mcg total) by mouth every Monday, Wednesday, and Friday. 09/26/18   [name], MD    Allergies Amitriptyline, Oxybutynin, Tramadol, Niacin and related, and Septra [sulfamethoxazole-trimethoprim]  Family History  Problem Relation Age of Onset  . Heart disease Mother 75  . Hyperlipidemia Mother   . Hyperlipidemia Father   . Stroke Father   . Heart disease Father   . Diabetes Father    . Fibromyalgia Sister   . Hyperlipidemia Sister   . Heart disease Brother   . Diabetes Brother     Social History Social History   Tobacco Use  . Smoking status: Never Smoker  . Smokeless tobacco: Never Used  Vaping Use  . Vaping Use: Never used  Substance Use Topics  . Alcohol use: No    Alcohol/week: 0.0 standard drinks  . Drug use: No    Review of Systems Constitutional: No fever/chills Eyes: No visual changes. ENT: No sore throat. Cardiovascular: Denies chest pain. Respiratory: Denies shortness of breath. Gastrointestinal: No abdominal pain.  No nausea, no vomiting.  No diarrhea. Genitourinary: Negative for dysuria. Musculoskeletal: Negative for acute arthralgias Skin: Negative for rash. Neurological: Negative for headaches, endorses weakness/numbness/paresthesias in right upper and lower extremity Psychiatric: Negative for suicidal ideation/homicidal ideation   ____________________________________________   PHYSICAL EXAM:  VITAL SIGNS: ED Triage Vitals  Enc Vitals Group     BP 08/29/20 0542 Marland Kitchen)  157/82     Pulse Rate 08/29/20 0542 69     Resp 08/29/20 0542 16     Temp 08/29/20 0542 98.5 F (36.9 C)     Temp Source 08/29/20 0542 Oral     SpO2 08/29/20 0542 100 %     Weight 08/29/20 0543 170 lb (77.1 kg)     Height 08/29/20 0543 5\' 3"  (1.6 m)     Head Circumference --      Peak Flow --      Pain Score 08/29/20 0543 0     Pain Loc --      Pain Edu? --      Excl. in [place]? --    Constitutional: Alert and oriented. Well appearing and in no acute distress. Eyes: Conjunctivae are normal. PERRL. Head: Atraumatic. Nose: No congestion/rhinnorhea. Mouth/Throat: Mucous membranes are moist. Neck: No stridor Cardiovascular: Grossly normal heart sounds.  Good peripheral circulation. Respiratory: Normal respiratory effort.  No retractions. Gastrointestinal: Soft and nontender. No distention. Musculoskeletal: No obvious deformities Neurologic:  Normal speech and  language.  Right lower extremity drift.  Decree sensation in right upper and lower extremity Skin:  Skin is warm and dry. No rash noted. Psychiatric: Mood and affect are normal. Speech and behavior are normal.  ____________________________________________   LABS (all labs ordered are listed, but only abnormal results are displayed)  Labs Reviewed  BASIC METABOLIC PANEL - Abnormal; Notable for the following components:      Result Value   Glucose, Bld 120 (*)    All other components within normal limits  URINALYSIS, COMPLETE (UACMP) WITH MICROSCOPIC - Abnormal; Notable for the following components:   Color, Urine COLORLESS (*)    APPearance CLEAR (*)    Specific Gravity, Urine 1.004 (*)    All other components within normal limits  RESP PANEL BY RT-PCR (FLU A&B, COVID) ARPGX2  CBC  CBG MONITORING, ED   ____________________________________________  EKG  ED ECG REPORT I, Naaman Plummer, the attending physician, personally viewed and interpreted this ECG.  Date: 08/29/2020 EKG Time: 0542 Rate: 67 Rhythm: normal sinus rhythm QRS Axis: normal Intervals: normal ST/T Wave abnormalities: normal Narrative Interpretation: no evidence of acute ischemia  ____________________________________________  RADIOLOGY  ED MD interpretation: CT of the head without contrast shows no evidence of acute abnormalities including no intracerebral hemorrhage, obvious masses, or significant edema Official radiology report(s): CT Head Wo Contrast  Result Date: 08/29/2020 CLINICAL DATA:  Stroke suspected. No aero deficit. Right side weakness and lips healing tingly for 1 day. Right lower extremity weakness EXAM: CT HEAD WITHOUT CONTRAST TECHNIQUE: Contiguous axial images were obtained from the base of the skull through the vertex without intravenous contrast. COMPARISON:  CT head 04/22/2018 FINDINGS: Brain: Patchy and confluent areas of decreased attenuation are noted throughout the deep and  periventricular white matter of the cerebral hemispheres bilaterally, compatible with chronic microvascular ischemic disease. No evidence of large-territorial acute infarction. No parenchymal hemorrhage. No mass lesion. No extra-axial collection. No mass effect or midline shift. No hydrocephalus. Basilar cisterns are patent. Vascular: No hyperdense vessel. Atherosclerotic calcifications are present within the cavernous internal carotid arteries. Skull: No acute fracture or focal lesion. Sinuses/Orbits: Paranasal sinuses and mastoid air cells are clear. The orbits are unremarkable. Other: None. IMPRESSION: No acute intracranial abnormality. Electronically Signed   By: Iven Finn M.D.   On: 08/29/2020 06:25    ____________________________________________   PROCEDURES  Procedure(s) performed (including Critical Care):  .1-3 Lead EKG Interpretation Performed by: Cheri Fowler,  Vista Lawman, MD Authorized by: Naaman Plummer, MD     Interpretation: normal     ECG rate:  61   ECG rate assessment: normal     Rhythm: sinus rhythm     Ectopy: none     Conduction: normal       ____________________________________________   INITIAL IMPRESSION / ASSESSMENT AND PLAN / ED COURSE  As part of my medical decision making, I reviewed the following data within the [place] notes reviewed and incorporated, Labs reviewed, EKG interpreted, Old chart reviewed, Radiograph reviewed and Notes from prior ED visits reviewed and incorporated        Patient is a 75 year old female who presents with symptoms concerning for TIA PMH risk factors: Hypertension, hypercholesterolemia Neurologic Deficits: Right upper and lower extremity paresthesias, numbness, and weakness Last known Well Time: 1900 08/28/20 NIH Stroke Score: 2 Given History and Exam I have lower suspicion for infectious etiology, neurologic changes secondary to toxicologic ingestion, seizure, complex migraine. Presentation  concerning for possible stroke requiring workup.  Workup: Labs: POC glucose, CBC, BMP, LFTs, Troponin, PT/INR, PTT, Type and Screen Other Diagnostics: ECG, CXR, non-contrast head CT  Interventions: Patient's not eligible for TPA due to timing of onset of symptoms  Consult: hospitalist Disposition: Admit      ____________________________________________   FINAL CLINICAL IMPRESSION(S) / ED DIAGNOSES  Final diagnoses:  Right sided weakness  Paresthesia of right upper extremity  Paresthesia of right lower extremity     ED Discharge Orders    None       Note:  This document was prepared using Dragon voice recognition software and may include unintentional dictation errors.   Naaman Plummer, MD 08/29/20 [id number]    Naaman Plummer, MD 08/29/20 [phone]

## 2020-08-30 DIAGNOSIS — E7800 Pure hypercholesterolemia, unspecified: Secondary | ICD-10-CM | POA: Diagnosis present

## 2020-08-30 DIAGNOSIS — Z83438 Family history of other disorder of lipoprotein metabolism and other lipidemia: Secondary | ICD-10-CM | POA: Diagnosis not present

## 2020-08-30 DIAGNOSIS — G43909 Migraine, unspecified, not intractable, without status migrainosus: Secondary | ICD-10-CM | POA: Diagnosis present

## 2020-08-30 DIAGNOSIS — Z8249 Family history of ischemic heart disease and other diseases of the circulatory system: Secondary | ICD-10-CM | POA: Diagnosis not present

## 2020-08-30 DIAGNOSIS — Z7982 Long term (current) use of aspirin: Secondary | ICD-10-CM | POA: Diagnosis not present

## 2020-08-30 DIAGNOSIS — Z79899 Other long term (current) drug therapy: Secondary | ICD-10-CM | POA: Diagnosis not present

## 2020-08-30 DIAGNOSIS — Z888 Allergy status to other drugs, medicaments and biological substances status: Secondary | ICD-10-CM | POA: Diagnosis not present

## 2020-08-30 DIAGNOSIS — E785 Hyperlipidemia, unspecified: Secondary | ICD-10-CM | POA: Diagnosis not present

## 2020-08-30 DIAGNOSIS — R29703 NIHSS score 3: Secondary | ICD-10-CM | POA: Diagnosis present

## 2020-08-30 DIAGNOSIS — E119 Type 2 diabetes mellitus without complications: Secondary | ICD-10-CM | POA: Diagnosis not present

## 2020-08-30 DIAGNOSIS — Z20822 Contact with and (suspected) exposure to covid-19: Secondary | ICD-10-CM | POA: Diagnosis present

## 2020-08-30 DIAGNOSIS — I639 Cerebral infarction, unspecified: Secondary | ICD-10-CM | POA: Diagnosis present

## 2020-08-30 DIAGNOSIS — E1140 Type 2 diabetes mellitus with diabetic neuropathy, unspecified: Secondary | ICD-10-CM | POA: Diagnosis not present

## 2020-08-30 DIAGNOSIS — R531 Weakness: Secondary | ICD-10-CM | POA: Diagnosis present

## 2020-08-30 DIAGNOSIS — K219 Gastro-esophageal reflux disease without esophagitis: Secondary | ICD-10-CM | POA: Diagnosis present

## 2020-08-30 DIAGNOSIS — G8191 Hemiplegia, unspecified affecting right dominant side: Secondary | ICD-10-CM | POA: Diagnosis present

## 2020-08-30 DIAGNOSIS — M6281 Muscle weakness (generalized): Secondary | ICD-10-CM | POA: Diagnosis not present

## 2020-08-30 DIAGNOSIS — Z91048 Other nonmedicinal substance allergy status: Secondary | ICD-10-CM | POA: Diagnosis not present

## 2020-08-30 DIAGNOSIS — N200 Calculus of kidney: Secondary | ICD-10-CM | POA: Diagnosis not present

## 2020-08-30 DIAGNOSIS — I10 Essential (primary) hypertension: Secondary | ICD-10-CM | POA: Diagnosis present

## 2020-08-30 DIAGNOSIS — E739 Lactose intolerance, unspecified: Secondary | ICD-10-CM | POA: Diagnosis present

## 2020-08-30 DIAGNOSIS — D649 Anemia, unspecified: Secondary | ICD-10-CM | POA: Diagnosis not present

## 2020-08-30 DIAGNOSIS — Z9049 Acquired absence of other specified parts of digestive tract: Secondary | ICD-10-CM | POA: Diagnosis not present

## 2020-08-30 DIAGNOSIS — I69320 Aphasia following cerebral infarction: Secondary | ICD-10-CM | POA: Diagnosis not present

## 2020-08-30 DIAGNOSIS — G629 Polyneuropathy, unspecified: Secondary | ICD-10-CM | POA: Diagnosis not present

## 2020-08-30 DIAGNOSIS — Z9851 Tubal ligation status: Secondary | ICD-10-CM | POA: Diagnosis not present

## 2020-08-30 DIAGNOSIS — I69351 Hemiplegia and hemiparesis following cerebral infarction affecting right dominant side: Secondary | ICD-10-CM | POA: Diagnosis present

## 2020-08-30 DIAGNOSIS — I6339 Cerebral infarction due to thrombosis of other cerebral artery: Secondary | ICD-10-CM | POA: Diagnosis not present

## 2020-08-30 DIAGNOSIS — I69398 Other sequelae of cerebral infarction: Secondary | ICD-10-CM | POA: Diagnosis not present

## 2020-08-30 DIAGNOSIS — I6381 Other cerebral infarction due to occlusion or stenosis of small artery: Secondary | ICD-10-CM | POA: Diagnosis present

## 2020-08-30 DIAGNOSIS — I69359 Hemiplegia and hemiparesis following cerebral infarction affecting unspecified side: Secondary | ICD-10-CM | POA: Diagnosis present

## 2020-08-30 DIAGNOSIS — Z90710 Acquired absence of both cervix and uterus: Secondary | ICD-10-CM | POA: Diagnosis not present

## 2020-08-30 DIAGNOSIS — Z87442 Personal history of urinary calculi: Secondary | ICD-10-CM | POA: Diagnosis not present

## 2020-08-30 LAB — BASIC METABOLIC PANEL
Anion gap: 9 (ref 5–15)
BUN: 17 mg/dL (ref 8–23)
CO2: 24 mmol/L (ref 22–32)
Calcium: 9.2 mg/dL (ref 8.9–10.3)
Chloride: 110 mmol/L (ref 98–111)
Creatinine, Ser: 0.8 mg/dL (ref 0.44–1.00)
GFR, Estimated: >60 mL/min (ref >60)
Glucose, Bld: 123 mg/dL — ABNORMAL HIGH (ref 70–99)
Potassium: 3.8 mmol/L (ref 3.5–5.1)
Sodium: 143 mmol/L (ref 135–145)

## 2020-08-30 LAB — LIPID PANEL
Cholesterol: 149 mg/dL (ref 0–200)
HDL: 52 mg/dL (ref >40)
LDL Cholesterol: 77 mg/dL (ref 0–99)
Total CHOL/HDL Ratio: 2.9 RATIO
Triglycerides: 101 mg/dL (ref <150)
VLDL: 20 mg/dL (ref 0–40)

## 2020-08-30 LAB — CBC
HCT: 38.4 % (ref 36.0–46.0)
Hemoglobin: 12.5 g/dL (ref 12.0–15.0)
MCH: 30 pg (ref 26.0–34.0)
MCHC: 32.6 g/dL (ref 30.0–36.0)
MCV: 92.1 fL (ref 80.0–100.0)
Platelets: 228 10*3/uL (ref 150–400)
RBC: 4.17 MIL/uL (ref 3.87–5.11)
RDW: 13.2 % (ref 11.5–15.5)
WBC: 8.1 10*3/uL (ref 4.0–10.5)
nRBC: 0 % (ref 0.0–0.2)

## 2020-08-30 LAB — HEMOGLOBIN A1C
Hgb A1c MFr Bld: 6.5 % — ABNORMAL HIGH (ref 4.8–5.6)
Mean Plasma Glucose: 139.85 mg/dL

## 2020-08-30 MED ORDER — CLOPIDOGREL BISULFATE 75 MG PO TABS
75.0000 mg | ORAL_TABLET | Freq: Every day | ORAL | Status: DC
  Administered 2020-08-31 – 2020-09-01 (×2): 75 mg via ORAL
  Filled 2020-08-30 (×2): qty 1

## 2020-08-30 MED ORDER — ASPIRIN EC 81 MG PO TBEC
81.0000 mg | DELAYED_RELEASE_TABLET | Freq: Every day | ORAL | Status: DC
  Administered 2020-08-30 – 2020-09-01 (×3): 81 mg via ORAL
  Filled 2020-08-30 (×3): qty 1

## 2020-08-30 MED ORDER — CLOPIDOGREL BISULFATE 75 MG PO TABS
300.0000 mg | ORAL_TABLET | Freq: Once | ORAL | Status: AC
  Administered 2020-08-30: 300 mg via ORAL
  Filled 2020-08-30: qty 4

## 2020-08-30 NOTE — Progress Notes (Signed)
PROGRESS NOTE                                                                             PROGRESS NOTE                                                                                                                                                                                                             Patient Demographics:    Dana [name], is a 75 y.o. female, DOB - Apr 10, 1946, ZOX:[id number]  Outpatient Primary MD for the patient is [name], MD    LOS - 0  Admit date - 08/29/2020    Chief Complaint  Patient presents with  . Weakness       Brief Narrative    Dana [name] is a 75 y.o. female with medical history significant of migraine, HLD, DM, GERD, lactose intolerance, kidney stone (s/p lithotripsy), diverticulitis, anemia, GERD incontinence, memory loss, who presents with right-sided weakness.  MRI significant for acute CVA, she was admitted for further work-up.   Subjective:    Dana [name] today worsening on right upper and lower extremity weakness today .    Assessment  & Plan :    Principal Problem:   Right sided weakness Active Problems:   Migraine   GERD (gastroesophageal reflux disease)   Hyperlipidemia   Diabetes mellitus without complication (HCC)   Stroke [facility])   CVA (cerebral vascular accident) ([place]) .  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 -Allergy input greatly appreciated, CTA head and neck with minimal atherosclerosis, without large vessel occlusion, or significant stenosis in head and neck, likely small vessel etiology. -Recommendation for dual antiplatelet therapy, aspirin Plavix, loaded with Plavix 300 mg daily, continue aspirin and Plavix for 3 weeks, then aspirin alone.   -Follow on 2D echo, if any evidence of left atrial enlargement will need long term cardiac monitor. -Continue with telemetry monitoring -LDL 77, continue with statin -PT/OT recommending SNF  GERD  (gastroesophageal reflux disease) -protonix  Hyperlipidemia -LDL is 77, continue  with statin  Diet controlled diabetes mellitus without complication [facility]): = Recent A1c 6.1, well controlled.  Patient is not taking medications currently.  Blood sugar 120. -Follow-up CBG every morning   SpO2: 97 %  Recent Labs  Lab 08/29/20 0552 08/29/20 0645 08/30/20 0506  WBC 5.5  --  8.1  PLT 211  --  228  BNP 37.2  --   --   SARSCOV2NAA  --  NEGATIVE  --        ABG  No results found for: PHART, PCO2ART, PO2ART, HCO3, TCO2, ACIDBASEDEF, O2SAT     Condition - Extremely Guarded  Family Communication  :  Son at bedside  Code Status :  Full  Consults  :  Neurology  Disposition Plan  :    Status is: Inpatient  Remains inpatient appropriate because:IV treatments appropriate due to intensity of illness or inability to take PO   Dispo: The patient is from: Home              Anticipated d/c is to: SNF              Patient currently is not medically stable to d/c.   Difficult to place patient No      DVT Prophylaxis  :  Lovenox   Lab Results  Component Value Date   PLT 228 08/30/2020    Diet :  Diet Order            Diet Heart Room service appropriate? Yes; Fluid consistency: Thin  Diet effective now                  Inpatient Medications  Scheduled Meds: . aspirin EC  81 mg Oral Daily  . atorvastatin  40 mg Oral Daily  . [START ON 08/31/2020] clopidogrel  75 mg Oral Daily  . enoxaparin (LOVENOX) injection  40 mg Subcutaneous Q24H  . fluticasone  2 spray Each Nare Daily  . multivitamin with minerals  1 tablet Oral Daily  . pantoprazole  40 mg Oral Daily  . topiramate  75 mg Oral QHS  . triamcinolone cream   Topical BID  . [START ON 08/31/2020] vitamin B-12  1,000 mcg Oral Q M,W,F   Continuous Infusions: PRN Meds:.acetaminophen **OR** acetaminophen (TYLENOL) oral liquid 160 mg/5 mL **OR** acetaminophen, butalbital-acetaminophen-caffeine, methocarbamol,  ondansetron, senna-docusate  Antibiotics  :    Anti-infectives (From admission, onward)   None        Phillips Climes M.D on 08/30/2020 at 12:52 PM  To page go to [email or web address]   [facility] -  Office  [phone]     Objective:   Vitals:   08/30/20 0100 08/30/20 0451 08/30/20 0833 08/30/20 1205  BP: 117/64 129/61 125/64 140/79  Pulse: 63 65 64 75  Resp: 16 16 15 14   Temp: 97.8 F (36.6 C) 97.7 F (36.5 C) 98 F (36.7 C)   TempSrc: Oral Oral    SpO2: 96% 98% 96% 97%  Weight:      Height:        Wt Readings from Last 3 Encounters:  08/29/20 77.1 kg  07/20/20 76.4 kg  05/19/20 75.5 kg     Intake/Output Summary (Last 24 hours) at 08/30/2020 1252 Last data filed at 08/29/2020 1941 Gross per 24 hour  Intake 240 ml  Output --  Net 240 ml     Physical Exam  Awake Alert, No new F.N deficits, Normal affect, slightly slowed speech, but no aphasia or dysphagia, has right-sided weakness  Symmetrical Chest wall movement, Good air movement bilaterally, CTAB RRR,No Gallops,Rubs or new Murmurs, No Parasternal Heave +ve B.Sounds, Abd Soft, No tenderness, No rebound - guarding or rigidity. No Cyanosis, Clubbing or edema, No new Rash or bruise      Data Review:    CBC Recent Labs  Lab 08/29/20 0552 08/30/20 0506  WBC 5.5 8.1  HGB 12.4 12.5  HCT 36.8 38.4  PLT 211 228  MCV 92.0 92.1  MCH 31.0 30.0  MCHC 33.7 32.6  RDW 13.5 13.2    Recent Labs  Lab 08/29/20 0552 08/30/20 0506  NA 141 143  K 3.5 3.8  CL 108 110  CO2 23 24  GLUCOSE 120* 123*  BUN 15 17  CREATININE 0.74 0.80  CALCIUM 9.6 9.2  BNP 37.2  --     ------------------------------------------------------------------------------------------------------------------ Recent Labs    08/30/20 0506  CHOL 149  HDL 52  LDLCALC 77  TRIG 101  CHOLHDL 2.9    Lab Results  Component Value Date   HGBA1C 6.1 (A) 04/07/2020    ------------------------------------------------------------------------------------------------------------------ No results for input(s): TSH, T4TOTAL, T3FREE, THYROIDAB in the last 72 hours.  Invalid input(s): FREET3  Cardiac Enzymes No results for input(s): CKMB, TROPONINI, MYOGLOBIN in the last 168 hours.  Invalid input(s): CK ------------------------------------------------------------------------------------------------------------------    Component Value Date/Time   BNP 37.2 08/29/2020 0552    Micro Results Recent Results (from the past 240 hour(s))  Resp Panel by RT-PCR (Flu A&B, Covid) Urine, Clean Catch     Status: None   Collection Time: 08/29/20  6:45 AM   Specimen: Urine, Clean Catch; Nasopharyngeal(NP) swabs in vial transport medium  Result Value Ref Range Status   SARS Coronavirus 2 by RT PCR NEGATIVE NEGATIVE Final    Comment: (NOTE) SARS-CoV-2 target nucleic acids are NOT DETECTED.  The SARS-CoV-2 RNA is generally detectable in upper respiratory specimens during the acute phase of infection. The lowest concentration of SARS-CoV-2 viral copies this assay can detect is 138 copies/mL. A negative result does not preclude SARS-Cov-2 infection and should not be used as the sole basis for treatment or other patient management decisions. A negative result may occur with  improper specimen collection/handling, submission of specimen other than nasopharyngeal swab, presence of viral mutation(s) within the areas targeted by this assay, and inadequate number of viral copies(<138 copies/mL). A negative result must be combined with clinical observations, patient history, and epidemiological information. The expected result is Negative.  Fact Sheet for Patients:  EntrepreneurPulse.com.au  Fact Sheet for Healthcare Providers:  IncredibleEmployment.be  This test is no t yet approved or cleared by the [place] FDA and  has been  authorized for detection and/or diagnosis of SARS-CoV-2 by FDA under an Emergency Use Authorization (EUA). This EUA will remain  in effect (meaning this test can be used) for the duration of the COVID-19 declaration under Section 564(b)(1) of the Act, 21 U.S.C.section 360bbb-3(b)(1), unless the authorization is terminated  or revoked sooner.       Influenza A by PCR NEGATIVE NEGATIVE Final   Influenza B by PCR NEGATIVE NEGATIVE Final    Comment: (NOTE) The Xpert Xpress SARS-CoV-2/FLU/RSV plus assay is intended as an aid in the diagnosis of influenza from Nasopharyngeal swab specimens and should not be used as a sole basis for treatment. Nasal washings and aspirates are unacceptable for Xpert Xpress SARS-CoV-2/FLU/RSV testing.  Fact Sheet for Patients: EntrepreneurPulse.com.au  Fact Sheet for Healthcare Providers: IncredibleEmployment.be  This test is not yet approved or cleared by  the [employer] and has been authorized for detection and/or diagnosis of SARS-CoV-2 by FDA under an Emergency Use Authorization (EUA). This EUA will remain in effect (meaning this test can be used) for the duration of the COVID-19 declaration under Section 564(b)(1) of the Act, 21 U.S.C. section 360bbb-3(b)(1), unless the authorization is terminated or revoked.  Performed at [facility], [address] [place], [place] [address]     Radiology Reports CT ANGIO HEAD W OR WO CONTRAST  Result Date: 08/29/2020 CLINICAL DATA:  Right-sided weakness. Acute left thalamic infarct on MRI. EXAM: CT ANGIOGRAPHY HEAD AND NECK TECHNIQUE: Multidetector CT imaging of the head and neck was performed using the standard protocol during bolus administration of intravenous contrast. Multiplanar CT image reconstructions and MIPs were obtained to evaluate the vascular anatomy. Carotid stenosis measurements (when applicable) are obtained utilizing NASCET criteria, using  the distal internal carotid diameter as the denominator. CONTRAST:  90mL OMNIPAQUE IOHEXOL 350 MG/ML SOLN COMPARISON:  None. FINDINGS: CTA NECK FINDINGS Aortic arch: Standard 3 vessel aortic arch with mild atherosclerotic plaque and widely patent arch vessel origins. Right carotid system: Patent without evidence of stenosis, dissection, or significant atherosclerosis. Left carotid system: Patent without evidence of stenosis, dissection, or significant atherosclerosis. Vertebral arteries: Patent and codominant without evidence of stenosis, dissection, or significant atherosclerosis. Skeleton: Mild cervical disc degeneration. Other neck: No evidence of cervical lymphadenopathy or mass. Upper chest: Mild biapical pleuroparenchymal lung scarring. Review of the MIP images confirms the above findings CTA HEAD FINDINGS Anterior circulation: The internal carotid arteries are patent from skull base to carotid termini with minimal right and mild left-sided atherosclerotic plaque not resulting in significant stenosis. ACAs and MCAs are patent without evidence of a proximal branch occlusion or significant proximal stenosis. No aneurysm is identified. Posterior circulation: Intracranial vertebral arteries are widely patent to the basilar. Patent AICA and SCA origins are seen bilaterally. The basilar artery is widely patent. Posterior communicating arteries are diminutive or absent. Both PCAs are patent without evidence of a significant proximal stenosis. No aneurysm is identified. Venous sinuses: Patent. Anatomic variants: Hypoplastic right A1. Review of the MIP images confirms the above findings IMPRESSION: 1. Minimal atherosclerosis without large vessel occlusion or significant stenosis in the head or neck. 2. Aortic Atherosclerosis (ICD10-I70.0). Electronically Signed   By: Logan Bores M.D.   On: 08/29/2020 15:25   CT Head Wo Contrast  Result Date: 08/29/2020 CLINICAL DATA:  Stroke suspected. No aero deficit. Right side  weakness and lips healing tingly for 1 day. Right lower extremity weakness EXAM: CT HEAD WITHOUT CONTRAST TECHNIQUE: Contiguous axial images were obtained from the base of the skull through the vertex without intravenous contrast. COMPARISON:  CT head 04/22/2018 FINDINGS: Brain: Patchy and confluent areas of decreased attenuation are noted throughout the deep and periventricular white matter of the cerebral hemispheres bilaterally, compatible with chronic microvascular ischemic disease. No evidence of large-territorial acute infarction. No parenchymal hemorrhage. No mass lesion. No extra-axial collection. No mass effect or midline shift. No hydrocephalus. Basilar cisterns are patent. Vascular: No hyperdense vessel. Atherosclerotic calcifications are present within the cavernous internal carotid arteries. Skull: No acute fracture or focal lesion. Sinuses/Orbits: Paranasal sinuses and mastoid air cells are clear. The orbits are unremarkable. Other: None. IMPRESSION: No acute intracranial abnormality. Electronically Signed   By: Iven Finn M.D.   On: 08/29/2020 06:25   CT ANGIO NECK W OR WO CONTRAST  Result Date: 08/29/2020 CLINICAL DATA:  Right-sided weakness. Acute left thalamic infarct  on MRI. EXAM: CT ANGIOGRAPHY HEAD AND NECK TECHNIQUE: Multidetector CT imaging of the head and neck was performed using the standard protocol during bolus administration of intravenous contrast. Multiplanar CT image reconstructions and MIPs were obtained to evaluate the vascular anatomy. Carotid stenosis measurements (when applicable) are obtained utilizing NASCET criteria, using the distal internal carotid diameter as the denominator. CONTRAST:  33mL OMNIPAQUE IOHEXOL 350 MG/ML SOLN COMPARISON:  None. FINDINGS: CTA NECK FINDINGS Aortic arch: Standard 3 vessel aortic arch with mild atherosclerotic plaque and widely patent arch vessel origins. Right carotid system: Patent without evidence of stenosis, dissection, or  significant atherosclerosis. Left carotid system: Patent without evidence of stenosis, dissection, or significant atherosclerosis. Vertebral arteries: Patent and codominant without evidence of stenosis, dissection, or significant atherosclerosis. Skeleton: Mild cervical disc degeneration. Other neck: No evidence of cervical lymphadenopathy or mass. Upper chest: Mild biapical pleuroparenchymal lung scarring. Review of the MIP images confirms the above findings CTA HEAD FINDINGS Anterior circulation: The internal carotid arteries are patent from skull base to carotid termini with minimal right and mild left-sided atherosclerotic plaque not resulting in significant stenosis. ACAs and MCAs are patent without evidence of a proximal branch occlusion or significant proximal stenosis. No aneurysm is identified. Posterior circulation: Intracranial vertebral arteries are widely patent to the basilar. Patent AICA and SCA origins are seen bilaterally. The basilar artery is widely patent. Posterior communicating arteries are diminutive or absent. Both PCAs are patent without evidence of a significant proximal stenosis. No aneurysm is identified. Venous sinuses: Patent. Anatomic variants: Hypoplastic right A1. Review of the MIP images confirms the above findings IMPRESSION: 1. Minimal atherosclerosis without large vessel occlusion or significant stenosis in the head or neck. 2. Aortic Atherosclerosis (ICD10-I70.0). Electronically Signed   By: Logan Bores M.D.   On: 08/29/2020 15:25   MR BRAIN WO CONTRAST  Result Date: 08/29/2020 CLINICAL DATA:  Right-sided weakness. EXAM: MRI HEAD WITHOUT CONTRAST TECHNIQUE: Multiplanar, multiecho pulse sequences of the brain and surrounding structures were obtained without intravenous contrast. COMPARISON:  Head CT 08/29/2020 and MRI 05/20/2017 FINDINGS: Brain: There is a 9 mm acute infarct laterally in the left thalamus. Chronic microhemorrhages are noted in the right thalamus, pons, and  left periventricular white matter and may be secondary to chronic hypertension. Patchy T2 hyperintensities in the cerebral white matter bilaterally are similar to the prior MRI and are nonspecific but compatible with moderate chronic small vessel ischemic disease. Chronic bilateral basal ganglia lacunar infarcts are unchanged from the prior MRI. Chronic lacunar infarcts in the left thalamus and left pons are new. The ventricles and sulci are within normal limits for age. No mass, midline shift, or extra-axial fluid collection is identified. Vascular: Major intracranial vascular flow voids are preserved. Skull and upper cervical spine: Unremarkable bone marrow signal. Sinuses/Orbits: Unremarkable orbits. Paranasal sinuses and mastoid air cells are clear. Other: None. IMPRESSION: 1. Acute left thalamic infarct. 2. Moderate chronic small vessel ischemic disease with chronic lacunar infarcts as above, increased in number from 2019. Electronically Signed   By: Logan Bores M.D.   On: 08/29/2020 12:43

## 2020-08-30 NOTE — Progress Notes (Signed)
Physical Therapy Treatment Patient Details Name: Dana [name] MRN: [id number] DOB: April 12, 1946 Today's Date: 08/30/2020    History of Present Illness Pt. is a 75 y.o. female who was admitted to [facility] with sided weakness. New L thalamic infarct noted, as well as previous and more numerous chronic lacunar infarcts.  PMHx includes: Migraines, DM, GERD, Lactose Intolerance, Kidney Stones, Diverticulitis, Anemia, Incontinence.    PT Comments    Patient agreeable to PT treatment session. Patient complains of worsening right side symptoms and required increased physical assistance today compared to yesterday. Patient needs minimal assistance for bed mobility and sit to stand transfers with cues for sequencing and technique. Patient was able to ambulate 30ft with rolling walker with Min A progressing to Mod A for weight shifting assistance for advancement of RLE with fatigue noticeable. Patient also required approximately 75% verbal cues for sequencing of BLE and rolling walker with decreased coordination with foot placement of RLE.  Given burden of care and current deficits demonstrated, recommend to change the discharge recommendation from HHPT to SNF for ongoing rehab efforts. Son in the room is in agreement that the patient would not be safe to return home at this time as she is a high fall risk. Recommend to continue PT to maximize independence in preparation for next level of care.     Follow Up Recommendations  SNF     Equipment Recommendations  Rolling walker with 5" wheels    Recommendations for Other Services       Precautions / Restrictions Precautions Precautions: Fall Restrictions Weight Bearing Restrictions: No    Mobility  Bed Mobility Overal bed mobility: Needs Assistance Bed Mobility: Supine to Sit     Supine to sit: Min assist     General bed mobility comments: facilitation for right side due to decreased coodination with bed mobility efforts. verbal cues for  sequencing and technqiue    Transfers Overall transfer level: Needs assistance Equipment used: Rolling walker (2 wheeled) Transfers: Sit to/from [facility] Sit to Stand: Min assist Stand pivot transfers: Mod assist       General transfer comment: Mod A for stand pivot transfer from bed to [facility] without assistive device for lifting and steadying assistance due to right side weakness. Min A for standing required with standing bouts from bed, bed side commode, and recliner chair with cues for hand placement and for weight bearing through RLE. rolling walker used for sit to stand transfers  Ambulation/Gait Ambulation/Gait assistance: Min assist;Mod assist Gait Distance (Feet): 30 Feet Assistive device: Rolling walker (2 wheeled) Gait Pattern/deviations: Step-to pattern Gait velocity: decreased   General Gait Details: patient required Min A initially progressing to Mod A with increased gait distance for weight shifting to left for advancement of RLE. Patient needs 75% verbal cues for sequencing of BLE and rolling walker. Patient fatigued with activity and unable to ambulate further at this time. significant extra time required for ambulation   Stairs             Wheelchair Mobility    Modified Rankin (Stroke Patients Only)       Balance Overall balance assessment: Needs assistance Sitting-balance support: Feet supported Sitting balance-Leahy Scale: Fair     Standing balance support: Bilateral upper extremity supported Standing balance-Leahy Scale: Poor Standing balance comment: Min A required to maintian standing balance with rolling walker for support  Cognition Arousal/Alertness: Awake/alert Behavior During Therapy: WFL for tasks assessed/performed Overall Cognitive Status: Within Functional Limits for tasks assessed                                        Exercises      General Comments         Pertinent Vitals/Pain Pain Assessment: No/denies pain    Home Living                      Prior Function            PT Goals (current goals can now be found in the care plan section) Acute Rehab PT Goals Patient Stated Goal: to get stronger PT Goal Formulation: With patient/family Time For Goal Achievement: 09/12/20 Potential to Achieve Goals: Good Progress towards PT goals: Progressing toward goals    Frequency    7X/week      PT Plan Discharge plan needs to be updated    Co-evaluation              AM-PAC PT "6 Clicks" Mobility   Outcome Measure  Help needed turning from your back to your side while in a flat bed without using bedrails?: A Little Help needed moving from lying on your back to sitting on the side of a flat bed without using bedrails?: A Little Help needed moving to and from a bed to a chair (including a wheelchair)?: A Little Help needed standing up from a chair using your arms (e.g., wheelchair or bedside chair)?: A Little Help needed to walk in hospital room?: A Lot Help needed climbing 3-5 steps with a railing? : Total 6 Click Score: 15    End of Session Equipment Utilized During Treatment: Gait belt Activity Tolerance: Patient tolerated treatment well;Patient limited by fatigue Patient left: in bed;with call bell/phone within reach;with chair alarm set;with family/visitor present   PT Visit Diagnosis: Muscle weakness (generalized) (M62.81);Difficulty in walking, not elsewhere classified (R26.2);Hemiplegia and hemiparesis Hemiplegia - Right/Left: Right Hemiplegia - dominant/non-dominant: Dominant Hemiplegia - caused by: Cerebral infarction     Time: 1007-1046 PT Time Calculation (min) (ACUTE ONLY): 39 min  Charges:  $Gait Training: 8-22 mins $Therapeutic Activity: 23-37 mins                    Minna Merritts, PT, MPT   Percell Locus 08/30/2020, 12:00 PM

## 2020-08-30 NOTE — Care Management Obs Status (Signed)
MEDICARE OBSERVATION STATUS NOTIFICATION   Patient Details  Name: Dana [name] MRN: [id number] Date of Birth: 12/04/1945   Medicare Observation Status Notification Given:  Yes (Patient's son gave verbal acknowledgement)    Trecia Rogers, LCSW 08/30/2020, 10:14 AM

## 2020-08-30 NOTE — NC FL2 (Signed)
[place] LEVEL OF CARE SCREENING TOOL     IDENTIFICATION  Patient Name: Dana [name] Birthdate: Sep 24, 1945 Sex: female Admission Date (Current Location): 08/29/2020  [place] and [place] Number:  Herbalist and Address:  [facility], [address] [place], [place] [address]      Provider Number: [id number]  Attending Physician Name and Address:  Albertine Patricia, MD  Relative Name and Phone Number:  Arva Chafe - [phone] (Patient's Son)    Current Level of Care: Hospital Recommended Level of Care: [place] Prior Approval Number:    Date Approved/Denied:   PASRR Number: [id number]  Discharge Plan: SNF    Current Diagnoses: Patient Active Problem List   Diagnosis Date Noted  . Right sided weakness 08/29/2020  . Stroke ([place]) 08/29/2020  . Hyperlipidemia   . Diabetes mellitus without complication ([place])   . Pain in lateral portion of right ankle 07/20/2020  . Plantar fasciitis of right foot 05/19/2020  . Small vessel disease, cerebrovascular   . Pes anserine bursitis 04/09/2019  . Skin rash 04/09/2019  . Red skin 02/25/2019  . Overactive bladder 10/24/2018  . Amnesia memory loss 05/19/2017  . Obesity, Class I, BMI 30-34.9 05/09/2017  . Lumbar herniated disc 02/01/2017  . Left shoulder pain 09/30/2016  . Medicare annual wellness visit, subsequent 05/03/2016  . 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 History of kidney stones   . Cervical pain (neck) 12/04/2015  . Health maintenance examination 04/29/2014  . Paresthesia 04/29/2014  . Advanced care planning/counseling discussion 04/29/2014  . PFD (pelvic floor dysfunction) 04/29/2014  . Controlled type 2 diabetes mellitus without complication, without long-term current use of insulin ([place]) 12/24/2013  . 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  . Hx of diverticulitis of colon 11/07/2012  . Allergic rhinitis 11/07/2012  . Hyperlipidemia associated with  type 2 diabetes mellitus ([place]) 11/07/2012  . Migraine 11/07/2012  . Hereditary and idiopathic peripheral neuropathy 11/07/2012  . LGSIL Pap smear of vaginal cuff 11/07/2012  . GERD (gastroesophageal reflux disease) 11/07/2012  . Family history of premature coronary heart disease 11/07/2012    Orientation RESPIRATION BLADDER Height & Weight     Self,Time,Situation,Place  Normal Continent Weight: 170 lb (77.1 kg) Height:  5\' 3"  (160 cm)  BEHAVIORAL SYMPTOMS/MOOD NEUROLOGICAL BOWEL NUTRITION STATUS      Continent Diet (Fluid)  AMBULATORY STATUS COMMUNICATION OF NEEDS Skin   Limited Assist Verbally Normal                       Personal Care Assistance Level of Assistance              Functional Limitations Info             SPECIAL CARE FACTORS FREQUENCY                       Contractures Contractures Info: Not present    Additional Factors Info  Code Status Code Status Info: FULL             Current Medications (08/30/2020):  This is the current hospital active medication list Current Facility-Administered Medications  Medication Dose Route Frequency Provider Last Rate Last Admin  . acetaminophen (TYLENOL) tablet 650 mg  650 mg Oral Q4H PRN Ivor Costa, MD       Or  . acetaminophen (TYLENOL) 160 MG/5ML solution 650 mg  650 mg Per Tube Q4H PRN Ivor Costa, MD  Or  . acetaminophen (TYLENOL) suppository 650 mg  650 mg Rectal Q4H PRN Ivor Costa, MD      . aspirin EC tablet 81 mg  81 mg Oral Daily Amie Portland, MD   81 mg at 08/30/20 1109  . atorvastatin (LIPITOR) tablet 40 mg  40 mg Oral Daily Ivor Costa, MD   40 mg at 08/29/20 2109  . butalbital-acetaminophen-caffeine (FIORICET) 50-325-40 MG per tablet 1 tablet  1 tablet Oral Q6H PRN Ivor Costa, MD      . Derrill Memo ON 08/31/2020] clopidogrel (PLAVIX) tablet 75 mg  75 mg Oral Daily Amie Portland, MD      . enoxaparin (LOVENOX) injection 40 mg  40 mg Subcutaneous Q24H Ivor Costa, MD   40 mg at 08/30/20  1110  . fluticasone (FLONASE) 50 MCG/ACT nasal spray 2 spray  2 spray Each Nare Daily Ivor Costa, MD   2 spray at 08/29/20 2115  . methocarbamol (ROBAXIN) tablet 500 mg  500 mg Oral TID PRN Ivor Costa, MD      . multivitamin with minerals tablet 1 tablet  1 tablet Oral Daily Ivor Costa, MD   1 tablet at 08/30/20 1109  . ondansetron (ZOFRAN-ODT) disintegrating tablet 4 mg  4 mg Oral Q8H PRN Sharion Settler, NP   4 mg at 08/29/20 2209  . pantoprazole (PROTONIX) EC tablet 40 mg  40 mg Oral Daily Ivor Costa, MD   40 mg at 08/30/20 1108  . senna-docusate (Senokot-S) tablet 1 tablet  1 tablet Oral QHS PRN Ivor Costa, MD      . topiramate (TOPAMAX) tablet 75 mg  75 mg Oral QHS Ivor Costa, MD   75 mg at 08/29/20 2110  . triamcinolone cream (KENALOG) 0.1 % cream   Topical BID Ivor Costa, MD      . Derrill Memo ON 08/31/2020] vitamin B-12 (CYANOCOBALAMIN) tablet 1,000 mcg  1,000 mcg Oral Q M,W,F Ivor Costa, MD         Discharge Medications: Please see discharge summary for a list of discharge medications.  Relevant Imaging Results:  Relevant Lab Results:   Additional Information SS#: [id number]  Trecia Rogers, LCSW

## 2020-08-30 NOTE — TOC Progression Note (Signed)
Transition of Care [facility]) - Progression Note    Patient Details  Name: Dana [name] MRN: [id number] Date of Birth: [birth year]  Transition of Care [facility]) CM/SW [place], [place] Phone Number: 08/30/2020, 12:24 PM  Clinical Narrative:     CSW sent bed searches out to [facility] and [facility] for SNF for the patient.   Expected Discharge Plan: [place] Barriers to Discharge: Continued Medical Work up  Expected Discharge Plan and Services Expected Discharge Plan: [place] Choice: [place] arrangements for the past 2 months: [place]: PT [place]: Well [place] Date [place]: 08/30/20 Time [place]: 0240 Representative spoke with at [place]: [place] ([place]) Interventions    Readmission Risk Interventions No flowsheet data found.

## 2020-08-30 NOTE — Progress Notes (Signed)
Neurology Progress Note   S:// Seen and examined this morning. Comfortably resting in bed Reports some worsening in walking and worsening of right upper extremity weakness since yesterday.   O:// Current vital signs: BP 129/61 (BP Location: Left Arm)   Pulse 65   Temp 97.7 F (36.5 C) (Oral)   Resp 16   Ht 5\' 3"  (1.6 m)   Wt 77.1 kg   SpO2 98%   BMI 30.11 kg/m  Vital signs in last 24 hours: Temp:  [97.4 F (36.3 C)-98.3 F (36.8 C)] 97.7 F (36.5 C) (04/24 0451) Pulse Rate:  [62-65] 65 (04/24 0451) Resp:  [15-16] 16 (04/24 0451) BP: (117-155)/(59-66) 129/61 (04/24 0451) SpO2:  [95 %-100 %] 98 % (04/24 0451) GENERAL: Awake, alert in NAD HEENT: - Normocephalic and atraumatic, dry mm, no LN++, no Thyromegally LUNGS - Clear to auscultation bilaterally with no wheezes CV - S1S2 RRR, no m/r/g, equal pulses bilaterally. ABDOMEN - Soft, nontender, nondistended with normoactive BS Ext: warm, well perfused, intact peripheral pulses, no edema NEURO:  Awake alert oriented x3 No dysarthria, no aphasia. Cranial nerves: Pupils equal round reactive light, extraocular movements intact, visual fields full, facial sensation intact, face is symmetric, palate midline, shoulder shrug intact, tongue midline. Motor exam: There is a mild vertical drift in both right upper and lower extremity today which is different than yesterday where she only had a drift in the right lower extremity.  Left side is intact. Sensory exam: Mildly diminished to light touch on the right upper extremity in comparison to the left. Coordination: Mild dysmetria in right finger-nose-finger and right heel-to-shin testing disproportionate to the weakness.  Left side intact Gait testing deferred at this time NIH stroke scale- 3 (1 for sensory, 1 for left arm, 1 for left leg)  Medications  Current Facility-Administered Medications:  .  acetaminophen (TYLENOL) tablet 650 mg, 650 mg, Oral, Q4H PRN **OR** acetaminophen  (TYLENOL) 160 MG/5ML solution 650 mg, 650 mg, Per Tube, Q4H PRN **OR** acetaminophen (TYLENOL) suppository 650 mg, 650 mg, Rectal, Q4H PRN, Ivor Costa, MD .  aspirin suppository 300 mg, 300 mg, Rectal, Daily **OR** aspirin EC tablet 325 mg, 325 mg, Oral, Daily, Ivor Costa, MD, 325 mg at 08/29/20 1016 .  atorvastatin (LIPITOR) tablet 40 mg, 40 mg, Oral, Daily, Ivor Costa, MD, 40 mg at 08/29/20 2109 .  butalbital-acetaminophen-caffeine (FIORICET) 50-325-40 MG per tablet 1 tablet, 1 tablet, Oral, Q6H PRN, Ivor Costa, MD .  enoxaparin (LOVENOX) injection 40 mg, 40 mg, Subcutaneous, Q24H, Ivor Costa, MD, 40 mg at 08/29/20 1016 .  fluticasone (FLONASE) 50 MCG/ACT nasal spray 2 spray, 2 spray, Each Nare, Daily, Ivor Costa, MD, 2 spray at 08/29/20 2115 .  methocarbamol (ROBAXIN) tablet 500 mg, 500 mg, Oral, TID PRN, Ivor Costa, MD .  multivitamin with minerals tablet 1 tablet, 1 tablet, Oral, Daily, Ivor Costa, MD, 1 tablet at 08/29/20 1016 .  ondansetron (ZOFRAN-ODT) disintegrating tablet 4 mg, 4 mg, Oral, Q8H PRN, Sharion Settler, NP, 4 mg at 08/29/20 2209 .  pantoprazole (PROTONIX) EC tablet 40 mg, 40 mg, Oral, Daily, Ivor Costa, MD .  senna-docusate (Senokot-S) tablet 1 tablet, 1 tablet, Oral, QHS PRN, Ivor Costa, MD .  topiramate (TOPAMAX) tablet 75 mg, 75 mg, Oral, QHS, Ivor Costa, MD, 75 mg at 08/29/20 2110 .  triamcinolone cream (KENALOG) 0.1 % cream, , Topical, BID, Ivor Costa, MD .  Derrill Memo ON 08/31/2020] vitamin B-12 (CYANOCOBALAMIN) tablet 1,000 mcg, 1,000 mcg, Oral, Q M,W,F, Ivor Costa, MD  Labs CBC    Component Value Date/Time   WBC 8.1 08/30/2020 0506   RBC 4.17 08/30/2020 0506   HGB 12.5 08/30/2020 0506   HCT 38.4 08/30/2020 0506   PLT 228 08/30/2020 0506   MCV 92.1 08/30/2020 0506   MCH 30.0 08/30/2020 0506   MCHC 32.6 08/30/2020 0506   RDW 13.2 08/30/2020 0506   LYMPHSABS 1.6 04/22/2018 1325   MONOABS 0.5 04/22/2018 1325   EOSABS 0.0 04/22/2018 1325   BASOSABS 0.0 04/22/2018  1325    CMP     Component Value Date/Time   NA 143 08/30/2020 0506   K 3.8 08/30/2020 0506   CL 110 08/30/2020 0506   CO2 24 08/30/2020 0506   GLUCOSE 123 (H) 08/30/2020 0506   BUN 17 08/30/2020 0506   CREATININE 0.80 08/30/2020 0506   CALCIUM 9.2 08/30/2020 0506   PROT 7.1 10/01/2019 0909   ALBUMIN 4.2 10/01/2019 0909   AST 15 10/01/2019 0909   ALT 20 10/01/2019 0909   ALKPHOS 76 10/01/2019 0909   BILITOT 0.6 10/01/2019 0909   GFRNONAA >60 08/30/2020 0506   GFRAA >60 04/22/2018 1325    glycosylated hemoglobin-pending LDL 77 2D echo pending  Lipid Panel     Component Value Date/Time   CHOL 149 08/30/2020 0506   TRIG 101 08/30/2020 0506   HDL 52 08/30/2020 0506   CHOLHDL 2.9 08/30/2020 0506   VLDL 20 08/30/2020 0506   LDLCALC 77 08/30/2020 0506     Imaging I have reviewed images in epic and the results pertinent to this consultation are: CT-scan of the brain IMPRESSION: No acute intracranial abnormality.  CTA head/neck:  IMPRESSION: 1. Minimal atherosclerosis without large vessel occlusion or significant stenosis in the head or neck. 2. Aortic Atherosclerosis (ICD10-I70.0).  MRI brain  IMPRESSION: 1. Acute left thalamic infarct. 2. Moderate chronic small vessel ischemic disease with chronic lacunar infarcts as above, increased in number from 2019.  Assessment:  75 year old with past medical history of diabetes, hyperlipidemia, migraine with complex migraines, presents to the emergency room for evaluation of right-sided weakness, outside the window for IV tPA and no signs suggestive of LVO. MRI brain reveals an acute left thalamic capsular infarct-likely small vessel etiology.  CT head and neck with minimal atherosclerosis without large vessel occlusion or significant stenosis in the head and neck.  On my review, no significant intracranial atherosclerotic disease. LDL 77 A1c and 2D echo pending No evidence of A. 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  Atorvastatin 40 mg daily-goal LDL is less than 70.  She is currently at 11.  Follow-up 2D echocardiogram-if any evidence of left atrial enlargement, will need long-term cardiac monitoring although the location of the stroke is not classic for an embolic event.  Pending A1c-goal A1c and diabetic less than 7.0.  Management per medical team.  Continue frequent neurochecks  PT  OT  Follow up in outpatient neurology clinic in 4 to 6 weeks.  Plan relayed to Dr. Waldron Labs.  -- Amie Portland, MD Neurologist [facility] Pager: [phone]

## 2020-08-30 NOTE — TOC Initial Note (Signed)
Transition of Care [facility]) - Initial/Assessment Note    Patient Details  Name: Dana [name] MRN: [id number] Date of Birth: 1946-01-17  Transition of Care [facility]) CM/SW Contact:    Dana [name], [place] Phone Number: 08/30/2020, 10:48 AM  Clinical Narrative:                  CSW contacted patient's phone and her son answered. Patient was in the room with her nurse. CSW discussed the OBS letter.   CSW discussed PT recommending home health PT for patient. Patient's son stated that his wife is in the field and would know better than him. Patient's son gave phone number to wife, Dana [name] [phone]).  CSW contacted patient's wife, Dana [name], and she stated that she does not have any particular decision about home health except knowing about [facility], but did have options for SNF ([place]).   CSW contacted [facility] and they were unable to take her. CSW contacted [facility] and they were able to take her.  At 10:50am, PT Dana [name]) reevaluated and recommended SNF with co-sign of doctor.   CSW contacted [facility] and cancelled that request.   CSW completed [place] MUST and obtained PSARR. CSW completed FL2 and awaiting signature from doctor and awaiting PT recommendation note for SNF to send for bed search.   Pending additional disposition.   Expected Discharge Plan: [place] Barriers to Discharge: Continued Medical Work up   Patient Goals and CMS Choice Patient states their goals for this hospitalization and ongoing recovery are:: Patient's son discussed wanting to get her help for her mobilization CMS Medicare.gov Compare Post Acute Care list provided to:: Patient Represenative (must comment) (patient's son and daughter in law) Choice offered to / list presented to : Spouse  Expected Discharge Plan and Services Expected Discharge Plan: [place] Acute Care Choice: [place] arrangements for the past 2 months:  [place]: PT HH Agency: [facility] Date [place]: 08/30/20 Time [place]: 1047 Representative spoke with at [place]: Dana [name]  Prior Living Arrangements/Services Living arrangements for the past 2 months: [place] Lives with:: Spouse Patient language and need for interpreter reviewed:: Yes Do you feel safe going back to the place where you live?: Yes      Need for Family Participation in Patient Care: Yes (Comment) (Patient's son and husband) Care giver support system in place?: Yes (comment) (Patient's son and husband) Current home services: DME Criminal Activity/Legal Involvement Pertinent to Current Situation/Hospitalization: No - Comment as needed  Activities of Daily Living Home Assistive Devices/Equipment: Eyeglasses,Shower chair without back ADL Screening (condition at time of admission) Patient's cognitive ability adequate to safely complete daily activities?: Yes Is the patient deaf or have difficulty hearing?: No Does the patient have difficulty seeing, even when wearing glasses/contacts?: No Does the patient have difficulty concentrating, remembering, or making decisions?: No Patient able to express need for assistance with ADLs?: Yes Does the patient have difficulty dressing or bathing?: No Independently performs ADLs?: Yes (appropriate for developmental age) Does the patient have difficulty walking or climbing stairs?: No Weakness of Legs: Right Weakness of Arms/Hands: None  Permission Sought/Granted   Permission granted to share information with : Yes, Verbal Permission Granted  Share  Information with NAME: Dana [name] and [place] granted to share info w Relationship: Patient's son and daughter in law     Emotional Assessment Appearance:: Well-Groomed Attitude/Demeanor/Rapport: Unable to Assess Affect (typically observed): Unable to Assess Orientation: :  Oriented to Self,Oriented to Place,Oriented to  Time,Oriented to Situation Alcohol / Substance Use: Not Applicable Psych Involvement: No (comment)  Admission diagnosis:  Right sided weakness [R53.1] Paresthesia of right lower extremity [R20.2] Paresthesia of right upper extremity [R20.2] Patient Active Problem List   Diagnosis Date Noted  . Right sided weakness 08/29/2020  . Stroke ([place]) 08/29/2020  . Hyperlipidemia   . Diabetes mellitus without complication ([place])   . Pain in lateral portion of right ankle 07/20/2020  . Plantar fasciitis of right foot 05/19/2020  . Small vessel disease, cerebrovascular   . Pes anserine bursitis 04/09/2019  . Skin rash 04/09/2019  . Red skin 02/25/2019  . Overactive bladder 10/24/2018  . Amnesia memory loss 05/19/2017  . Obesity, Class I, BMI 30-34.9 05/09/2017  . Lumbar herniated disc 02/01/2017  . Left shoulder pain 09/30/2016  . Medicare annual wellness visit, subsequent 05/03/2016  . 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 History of kidney stones   . Cervical pain (neck) 12/04/2015  . Health maintenance examination 04/29/2014  . Paresthesia 04/29/2014  . Advanced care planning/counseling discussion 04/29/2014  . PFD (pelvic floor dysfunction) 04/29/2014  . Controlled type 2 diabetes mellitus without complication, without long-term current use of insulin ([place]) 12/24/2013  . 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  . Hx of diverticulitis of colon 11/07/2012  . Allergic rhinitis 11/07/2012  . Hyperlipidemia associated with type 2 diabetes mellitus ([place]) 11/07/2012  . Migraine 11/07/2012  . Hereditary and idiopathic peripheral neuropathy 11/07/2012  . LGSIL Pap smear of vaginal cuff 11/07/2012  . GERD (gastroesophageal reflux disease) 11/07/2012  . Family history of premature coronary heart disease 11/07/2012   PCP:  Dana Bush, MD Pharmacy:   [facility], [place] [place]  [address] Phone: [phone] Fax: [phone]  [id number] [place], [address] [place] [address] Phone: [phone] Fax: [phone]  [id number] [place], [place] [address] Phone: [phone] Fax: [phone]     Social Determinants of Health (SDOH) Interventions    Readmission Risk Interventions No flowsheet data found.

## 2020-08-31 ENCOUNTER — Inpatient Hospital Stay
Admit: 2020-08-31 | Discharge: 2020-08-31 | Disposition: A | Discharge status: Home / Self Care | Payer: Medicare HMO | Attending: Internal Medicine | Admitting: Internal Medicine

## 2020-08-31 LAB — CBC
HCT: 37.2 % (ref 36.0–46.0)
Hemoglobin: 12.2 g/dL (ref 12.0–15.0)
MCH: 30 pg (ref 26.0–34.0)
MCHC: 32.8 g/dL (ref 30.0–36.0)
MCV: 91.6 fL (ref 80.0–100.0)
Platelets: 208 10*3/uL (ref 150–400)
RBC: 4.06 MIL/uL (ref 3.87–5.11)
RDW: 13.2 % (ref 11.5–15.5)
WBC: 5.7 10*3/uL (ref 4.0–10.5)
nRBC: 0 % (ref 0.0–0.2)

## 2020-08-31 LAB — ECHOCARDIOGRAM COMPLETE
AR max vel: 2.21 cm2
AV Area VTI: 3.48 cm2
AV Area mean vel: 2.23 cm2
AV Mean grad: 2 mmHg
AV Peak grad: 3.4 mmHg
Ao pk vel: 0.92 m/s
Area-P 1/2: 2.97 cm2
Height: 63 in
S' Lateral: 2.76 cm
Weight: 2720 oz

## 2020-08-31 LAB — BASIC METABOLIC PANEL
Anion gap: 7 (ref 5–15)
BUN: 23 mg/dL (ref 8–23)
CO2: 25 mmol/L (ref 22–32)
Calcium: 8.9 mg/dL (ref 8.9–10.3)
Chloride: 109 mmol/L (ref 98–111)
Creatinine, Ser: 0.79 mg/dL (ref 0.44–1.00)
GFR, Estimated: >60 mL/min (ref >60)
Glucose, Bld: 116 mg/dL — ABNORMAL HIGH (ref 70–99)
Potassium: 3.5 mmol/L (ref 3.5–5.1)
Sodium: 141 mmol/L (ref 135–145)

## 2020-08-31 MED ORDER — LACTASE 3000 UNITS PO TABS
6000.0000 [IU] | ORAL_TABLET | Freq: Three times a day (TID) | ORAL | Status: DC
  Administered 2020-09-01 (×2): 6000 [IU] via ORAL
  Filled 2020-08-31 (×4): qty 2

## 2020-08-31 NOTE — Progress Notes (Signed)
Physical Therapy Treatment Patient Details Name: Dana [name] MRN: [id number] DOB: 1945/11/20 Today's Date: 08/31/2020    History of Present Illness Dana [name] is a 27yoF who was [facility] with Right sided weakness. New L thalamic infarct noted, as well as previous and more numerous chronic lacunar infarcts.  PMHx includes: Migraines, DM, GERD, Lactose Intolerance, Kidney Stones, Diverticulitis, Anemia, Incontinence.    PT Comments    Pt in recliner upon entry, husband at bedside. Pt showing continued progress this date, is able to demonstrate STS transfers and AMB without physical assistance. Pt continues to have significant weakness in RLE/RUE, obvious foot drop in AMB, knee instability in midstance (both in flexion or hyperextension) without frank buckling. Pt given adequate rest breaks between efforts. Able to perform narrow stance standing unsupported for ~3 minutes. Will continue to follow.    Follow Up Recommendations  SNF     Equipment Recommendations  Rolling walker with 5" wheels    Recommendations for Other Services       Precautions / Restrictions Precautions Precautions: Fall Precaution Comments: R side weakness Restrictions Weight Bearing Restrictions: No    Mobility  Bed Mobility Overal bed mobility: Needs Assistance Bed Mobility: Supine to Sit     Supine to sit: Min assist     General bed mobility comments: in chair upon arrival    Transfers Overall transfer level: Needs assistance Equipment used: Rolling walker (2 wheeled) Transfers: Sit to/from Stand Sit to Stand: Supervision Stand pivot transfers: Mod assist       General transfer comment: inititally requires minA facilitation, then is able to perform without assist there after.  Ambulation/Gait   Gait Distance (Feet): 40 Feet (43ft x2 (seated rest interval between)) Assistive device: Rolling walker (2 wheeled) Gait Pattern/deviations: Step-to pattern Gait velocity: 6 minutes to  complete 40 feet; 0.90m/s   General Gait Details: cues for exacggerated hip flexion in swing phase. Pt unable to maintain Rt straight knee stability without partial flexion or recurvatum, but no frank buckling (likely due to temporal compensation and heavy UE use on RW).   Stairs             Wheelchair Mobility    Modified Rankin (Stroke Patients Only)       Balance Overall balance assessment: Needs assistance Sitting-balance support: Feet supported Sitting balance-Leahy Scale: Fair     Standing balance support: Bilateral upper extremity supported Standing balance-Leahy Scale: Poor                              Cognition Arousal/Alertness: Awake/alert Behavior During Therapy: WFL for tasks assessed/performed Overall Cognitive Status: Within Functional Limits for tasks assessed                                        Exercises Other Exercises Other Exercises: RLE LAQx10, seated marching bilat 1x10 (minA of RLE for full range), pillow squeeze 10x3secH Other Exercises: Narrow stance firm surface, no UE support x 3 minutes, minGuard assist (heavy sway, eventual LOB to right c assist from author)    General Comments        Pertinent Vitals/Pain Pain Assessment: No/denies pain    Home Living Family/patient expects to be discharged to:: Private residence Living Arrangements: Spouse/significant other Available Help at Discharge: Family Type of Home: House Home Access: Stairs to enter Entrance Stairs-Rails: None  Prior Function            PT Goals (current goals can now be found in the care plan section) Acute Rehab PT Goals Patient Stated Goal: to get stronger PT Goal Formulation: With patient/family Time For Goal Achievement: 09/12/20 Potential to Achieve Goals: Good Progress towards PT goals: Progressing toward goals    Frequency    7X/week      PT Plan Discharge plan needs to be updated;Current plan remains  appropriate    Co-evaluation              AM-PAC PT "6 Clicks" Mobility   Outcome Measure  Help needed turning from your back to your side while in a flat bed without using bedrails?: A Little Help needed moving from lying on your back to sitting on the side of a flat bed without using bedrails?: A Little Help needed moving to and from a bed to a chair (including a wheelchair)?: A Little Help needed standing up from a chair using your arms (e.g., wheelchair or bedside chair)?: A Little Help needed to walk in hospital room?: A Little Help needed climbing 3-5 steps with a railing? : A Lot 6 Click Score: 17    End of Session Equipment Utilized During Treatment: Gait belt Activity Tolerance: Patient tolerated treatment well Patient left: with call bell/phone within reach;with chair alarm set;with family/visitor present;in chair Nurse Communication: Mobility status PT Visit Diagnosis: Muscle weakness (generalized) (M62.81);Difficulty in walking, not elsewhere classified (R26.2);Hemiplegia and hemiparesis Hemiplegia - Right/Left: Right Hemiplegia - dominant/non-dominant: Dominant Hemiplegia - caused by: Cerebral infarction     Time: 3382-5053 PT Time Calculation (min) (ACUTE ONLY): 39 min  Charges:  $Neuromuscular Re-education: 38-52 mins                     3:59 PM, 08/31/20 Etta Grandchild, PT, DPT Physical Therapist - [facility]  [phone] ([place])    [place] C 08/31/2020, 3:57 PM

## 2020-08-31 NOTE — Progress Notes (Signed)
PROGRESS NOTE                                                                             PROGRESS NOTE                                                                                                                                                                                                             Patient Demographics:    Dana [name], is a 75 y.o. female, DOB - 1945-09-14, SAY:[id number]  Outpatient Primary MD for the patient is [name], MD    LOS - 1  Admit date - 08/29/2020    Chief Complaint  Patient presents with  . Weakness       Brief Narrative    Dana [name] is a 75 y.o. female with medical history significant of migraine, HLD, DM, GERD, lactose intolerance, kidney stone (s/p lithotripsy), diverticulitis, anemia, GERD incontinence, memory loss, who presents with right-sided weakness.  MRI significant for acute CVA, she was admitted for further work-up.   Subjective:    Dana [name] today denies any complaints overnight, she denies any worsening of right-sided weakness.    Assessment  & Plan :    Principal Problem:   Right sided weakness Active Problems:   Migraine   GERD (gastroesophageal reflux disease)   Hyperlipidemia   Diabetes mellitus without complication (HCC)   Stroke [facility])   CVA (cerebral vascular accident) ([place]) .  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 -Allergy input greatly appreciated, CTA head and neck with minimal atherosclerosis, without large vessel occlusion, or significant stenosis in head and neck, likely small vessel etiology. -Recommendation for dual antiplatelet therapy, aspirin Plavix, loaded with Plavix 300 mg daily, continue aspirin and Plavix for 3 weeks, then aspirin alone.   -The echo is pending, if any evidence of left atrial enlargement she will need long-term cardiac monitor  -Continue with telemetry monitoring, so far no evidence of A. fib or  arrhythmias. -LDL 77, continue with statin -PT/OT recommending SNF  GERD (gastroesophageal reflux disease) -protonix  Hyperlipidemia -LDL is 77, continue with statin  Diet controlled diabetes mellitus without complication ([place]): = Recent A1c 6.1, well controlled.  Patient is not taking medications currently.  Blood sugar 120. -Follow-up CBG every morning   SpO2: 96 %  Recent Labs  Lab 08/29/20 0552 08/29/20 0645 08/30/20 0506 08/31/20 0401  WBC 5.5  --  8.1 5.7  PLT 211  --  228 208  BNP 37.2  --   --   --   SARSCOV2NAA  --  NEGATIVE  --   --        ABG  No results found for: PHART, PCO2ART, PO2ART, HCO3, TCO2, ACIDBASEDEF, O2SAT     Condition - Extremely Guarded  Family Communication  :  None at bedside  Code Status :  Full  Consults  :  Neurology  Disposition Plan  :    Status is: Inpatient  Remains inpatient appropriate because:IV treatments appropriate due to intensity of illness or inability to take PO   Dispo: The patient is from: Home              Anticipated d/c is to: SNF              Patient currently is not medically stable to d/c.   Difficult to place patient No      DVT Prophylaxis  :  Lovenox   Lab Results  Component Value Date   PLT 208 08/31/2020    Diet :  Diet Order            Diet Heart Room service appropriate? Yes; Fluid consistency: Thin  Diet effective now                  Inpatient Medications  Scheduled Meds: . aspirin EC  81 mg Oral Daily  . atorvastatin  40 mg Oral Daily  . clopidogrel  75 mg Oral Daily  . enoxaparin (LOVENOX) injection  40 mg Subcutaneous Q24H  . fluticasone  2 spray Each Nare Daily  . multivitamin with minerals  1 tablet Oral Daily  . pantoprazole  40 mg Oral Daily  . topiramate  75 mg Oral QHS  . triamcinolone cream   Topical BID  . vitamin B-12  1,000 mcg Oral Q M,W,F   Continuous Infusions: PRN Meds:.acetaminophen **OR** acetaminophen (TYLENOL) oral liquid 160 mg/5 mL  **OR** acetaminophen, butalbital-acetaminophen-caffeine, methocarbamol, ondansetron, senna-docusate  Antibiotics  :    Anti-infectives (From admission, onward)   None        Phillips Climes M.D on 08/31/2020 at 1:54 PM  To page go to [email or web address]   [facility] -  Office  [phone]     Objective:   Vitals:   08/31/20 0031 08/31/20 0554 08/31/20 0750 08/31/20 1212  BP: 127/74 130/71 119/70 114/70  Pulse: 67 65 67 72  Resp: 16 16 18 16   Temp: 97.9 F (36.6 C) 97.7 F (36.5 C) (!) 97.4 F (36.3 C) 98.4 F (36.9 C)  TempSrc: Oral Oral  Oral  SpO2: 97% 96% 96% 96%  Weight:      Height:        Wt Readings from Last 3 Encounters:  08/29/20 77.1 kg  07/20/20 76.4 kg  05/19/20 75.5 kg     Intake/Output Summary (Last 24 hours) at 08/31/2020 1354 Last data filed at 08/30/2020 1931 Gross per 24 hour  Intake 240 ml  Output --  Net 240 ml     Physical Exam  Awake Alert, Oriented X 3, her speech is slow, but no aphasia or dysphonia, she has right-sided weakness Symmetrical Chest wall movement, Good air movement bilaterally, CTAB RRR,No Gallops,Rubs or new Murmurs, No Parasternal Heave +ve B.Sounds, Abd Soft, No tenderness, No rebound - guarding or rigidity. No Cyanosis, Clubbing or edema, No new Rash or bruise       Data Review:    CBC Recent Labs  Lab 08/29/20 0552 08/30/20 0506 08/31/20 0401  WBC 5.5 8.1 5.7  HGB 12.4 12.5 12.2  HCT 36.8 38.4 37.2  PLT 211 228 208  MCV 92.0 92.1 91.6  MCH 31.0 30.0 30.0  MCHC 33.7 32.6 32.8  RDW 13.5 13.2 13.2    Recent Labs  Lab 08/29/20 0552 08/30/20 0506 08/31/20 0401  NA 141 143 141  K 3.5 3.8 3.5  CL 108 110 109  CO2 23 24 25   GLUCOSE 120* 123* 116*  BUN 15 17 23   CREATININE 0.74 0.80 0.79  CALCIUM 9.6 9.2 8.9  HGBA1C  --  6.5*  --   BNP 37.2  --   --     ------------------------------------------------------------------------------------------------------------------ Recent Labs     08/30/20 0506  CHOL 149  HDL 52  LDLCALC 77  TRIG 101  CHOLHDL 2.9    Lab Results  Component Value Date   HGBA1C 6.5 (H) 08/30/2020   ------------------------------------------------------------------------------------------------------------------ No results for input(s): TSH, T4TOTAL, T3FREE, THYROIDAB in the last 72 hours.  Invalid input(s): FREET3  Cardiac Enzymes No results for input(s): CKMB, TROPONINI, MYOGLOBIN in the last 168 hours.  Invalid input(s): CK ------------------------------------------------------------------------------------------------------------------    Component Value Date/Time   BNP 37.2 08/29/2020 0552    Micro Results Recent Results (from the past 240 hour(s))  Resp Panel by RT-PCR (Flu A&B, Covid) Urine, Clean Catch     Status: None   Collection Time: 08/29/20  6:45 AM   Specimen: Urine, Clean Catch; Nasopharyngeal(NP) swabs in vial transport medium  Result Value Ref Range Status   SARS Coronavirus 2 by RT PCR NEGATIVE NEGATIVE Final    Comment: (NOTE) SARS-CoV-2 target nucleic acids are NOT DETECTED.  The SARS-CoV-2 RNA is generally detectable in upper respiratory specimens during the acute phase of infection. The lowest concentration of SARS-CoV-2 viral copies this assay can detect is 138 copies/mL. A negative result does not preclude SARS-Cov-2 infection and should not be used as the sole basis for treatment or other patient management decisions. A negative result may occur with  improper specimen collection/handling, submission of specimen other than nasopharyngeal swab, presence of viral mutation(s) within the areas targeted by this assay, and inadequate number of viral copies(<138 copies/mL). A negative result must be combined with clinical observations, patient history, and epidemiological information. The expected result is Negative.  Fact Sheet for Patients:  EntrepreneurPulse.com.au  Fact Sheet for  Healthcare Providers:  IncredibleEmployment.be  This test is no t yet approved or cleared by the [place] FDA and  has been authorized for detection and/or diagnosis of SARS-CoV-2 by FDA under an Emergency Use Authorization (EUA). This EUA will remain  in effect (meaning this test can be used) for the duration of the COVID-19 declaration under Section 564(b)(1) of the Act, 21 U.S.C.section 360bbb-3(b)(1), unless the authorization is terminated  or revoked sooner.       Influenza A by PCR NEGATIVE NEGATIVE Final   Influenza B by PCR NEGATIVE NEGATIVE Final    Comment: (NOTE) The Xpert Xpress SARS-CoV-2/FLU/RSV plus assay is intended as an aid in the diagnosis  of influenza from Nasopharyngeal swab specimens and should not be used as a sole basis for treatment. Nasal washings and aspirates are unacceptable for Xpert Xpress SARS-CoV-2/FLU/RSV testing.  Fact Sheet for Patients: EntrepreneurPulse.com.au  Fact Sheet for Healthcare Providers: IncredibleEmployment.be  This test is not yet approved or cleared by the [place] FDA and has been authorized for detection and/or diagnosis of SARS-CoV-2 by FDA under an Emergency Use Authorization (EUA). This EUA will remain in effect (meaning this test can be used) for the duration of the COVID-19 declaration under Section 564(b)(1) of the Act, 21 U.S.C. section 360bbb-3(b)(1), unless the authorization is terminated or revoked.  Performed at [facility], [address], [place], [place] [address]     Radiology Reports CT ANGIO HEAD W OR WO CONTRAST  Result Date: 08/29/2020 CLINICAL DATA:  Right-sided weakness. Acute left thalamic infarct on MRI. EXAM: CT ANGIOGRAPHY HEAD AND NECK TECHNIQUE: Multidetector CT imaging of the head and neck was performed using the standard protocol during bolus administration of intravenous contrast. Multiplanar CT image reconstructions  and MIPs were obtained to evaluate the vascular anatomy. Carotid stenosis measurements (when applicable) are obtained utilizing NASCET criteria, using the distal internal carotid diameter as the denominator. CONTRAST:  25mL OMNIPAQUE IOHEXOL 350 MG/ML SOLN COMPARISON:  None. FINDINGS: CTA NECK FINDINGS Aortic arch: Standard 3 vessel aortic arch with mild atherosclerotic plaque and widely patent arch vessel origins. Right carotid system: Patent without evidence of stenosis, dissection, or significant atherosclerosis. Left carotid system: Patent without evidence of stenosis, dissection, or significant atherosclerosis. Vertebral arteries: Patent and codominant without evidence of stenosis, dissection, or significant atherosclerosis. Skeleton: Mild cervical disc degeneration. Other neck: No evidence of cervical lymphadenopathy or mass. Upper chest: Mild biapical pleuroparenchymal lung scarring. Review of the MIP images confirms the above findings CTA HEAD FINDINGS Anterior circulation: The internal carotid arteries are patent from skull base to carotid termini with minimal right and mild left-sided atherosclerotic plaque not resulting in significant stenosis. ACAs and MCAs are patent without evidence of a proximal branch occlusion or significant proximal stenosis. No aneurysm is identified. Posterior circulation: Intracranial vertebral arteries are widely patent to the basilar. Patent AICA and SCA origins are seen bilaterally. The basilar artery is widely patent. Posterior communicating arteries are diminutive or absent. Both PCAs are patent without evidence of a significant proximal stenosis. No aneurysm is identified. Venous sinuses: Patent. Anatomic variants: Hypoplastic right A1. Review of the MIP images confirms the above findings IMPRESSION: 1. Minimal atherosclerosis without large vessel occlusion or significant stenosis in the head or neck. 2. Aortic Atherosclerosis (ICD10-I70.0). Electronically Signed   By:  Logan Bores M.D.   On: 08/29/2020 15:25   CT Head Wo Contrast  Result Date: 08/29/2020 CLINICAL DATA:  Stroke suspected. No aero deficit. Right side weakness and lips healing tingly for 1 day. Right lower extremity weakness EXAM: CT HEAD WITHOUT CONTRAST TECHNIQUE: Contiguous axial images were obtained from the base of the skull through the vertex without intravenous contrast. COMPARISON:  CT head 04/22/2018 FINDINGS: Brain: Patchy and confluent areas of decreased attenuation are noted throughout the deep and periventricular white matter of the cerebral hemispheres bilaterally, compatible with chronic microvascular ischemic disease. No evidence of large-territorial acute infarction. No parenchymal hemorrhage. No mass lesion. No extra-axial collection. No mass effect or midline shift. No hydrocephalus. Basilar cisterns are patent. Vascular: No hyperdense vessel. Atherosclerotic calcifications are present within the cavernous internal carotid arteries. Skull: No acute fracture or focal lesion. Sinuses/Orbits: Paranasal sinuses and mastoid  air cells are clear. The orbits are unremarkable. Other: None. IMPRESSION: No acute intracranial abnormality. Electronically Signed   By: Iven Finn M.D.   On: 08/29/2020 06:25   CT ANGIO NECK W OR WO CONTRAST  Result Date: 08/29/2020 CLINICAL DATA:  Right-sided weakness. Acute left thalamic infarct on MRI. EXAM: CT ANGIOGRAPHY HEAD AND NECK TECHNIQUE: Multidetector CT imaging of the head and neck was performed using the standard protocol during bolus administration of intravenous contrast. Multiplanar CT image reconstructions and MIPs were obtained to evaluate the vascular anatomy. Carotid stenosis measurements (when applicable) are obtained utilizing NASCET criteria, using the distal internal carotid diameter as the denominator. CONTRAST:  37mL OMNIPAQUE IOHEXOL 350 MG/ML SOLN COMPARISON:  None. FINDINGS: CTA NECK FINDINGS Aortic arch: Standard 3 vessel aortic arch  with mild atherosclerotic plaque and widely patent arch vessel origins. Right carotid system: Patent without evidence of stenosis, dissection, or significant atherosclerosis. Left carotid system: Patent without evidence of stenosis, dissection, or significant atherosclerosis. Vertebral arteries: Patent and codominant without evidence of stenosis, dissection, or significant atherosclerosis. Skeleton: Mild cervical disc degeneration. Other neck: No evidence of cervical lymphadenopathy or mass. Upper chest: Mild biapical pleuroparenchymal lung scarring. Review of the MIP images confirms the above findings CTA HEAD FINDINGS Anterior circulation: The internal carotid arteries are patent from skull base to carotid termini with minimal right and mild left-sided atherosclerotic plaque not resulting in significant stenosis. ACAs and MCAs are patent without evidence of a proximal branch occlusion or significant proximal stenosis. No aneurysm is identified. Posterior circulation: Intracranial vertebral arteries are widely patent to the basilar. Patent AICA and SCA origins are seen bilaterally. The basilar artery is widely patent. Posterior communicating arteries are diminutive or absent. Both PCAs are patent without evidence of a significant proximal stenosis. No aneurysm is identified. Venous sinuses: Patent. Anatomic variants: Hypoplastic right A1. Review of the MIP images confirms the above findings IMPRESSION: 1. Minimal atherosclerosis without large vessel occlusion or significant stenosis in the head or neck. 2. Aortic Atherosclerosis (ICD10-I70.0). Electronically Signed   By: Logan Bores M.D.   On: 08/29/2020 15:25   MR BRAIN WO CONTRAST  Result Date: 08/29/2020 CLINICAL DATA:  Right-sided weakness. EXAM: MRI HEAD WITHOUT CONTRAST TECHNIQUE: Multiplanar, multiecho pulse sequences of the brain and surrounding structures were obtained without intravenous contrast. COMPARISON:  Head CT 08/29/2020 and MRI 05/20/2017  FINDINGS: Brain: There is a 9 mm acute infarct laterally in the left thalamus. Chronic microhemorrhages are noted in the right thalamus, pons, and left periventricular white matter and may be secondary to chronic hypertension. Patchy T2 hyperintensities in the cerebral white matter bilaterally are similar to the prior MRI and are nonspecific but compatible with moderate chronic small vessel ischemic disease. Chronic bilateral basal ganglia lacunar infarcts are unchanged from the prior MRI. Chronic lacunar infarcts in the left thalamus and left pons are new. The ventricles and sulci are within normal limits for age. No mass, midline shift, or extra-axial fluid collection is identified. Vascular: Major intracranial vascular flow voids are preserved. Skull and upper cervical spine: Unremarkable bone marrow signal. Sinuses/Orbits: Unremarkable orbits. Paranasal sinuses and mastoid air cells are clear. Other: None. IMPRESSION: 1. Acute left thalamic infarct. 2. Moderate chronic small vessel ischemic disease with chronic lacunar infarcts as above, increased in number from 2019. Electronically Signed   By: Logan Bores M.D.   On: 08/29/2020 12:43

## 2020-08-31 NOTE — Progress Notes (Signed)
Occupational Therapy Treatment Patient Details Name: Dana [name] MRN: [id number] DOB: 21-Feb-1946 Today's Date: 08/31/2020    History of present illness Pt. is a 75 y.o. female who was admitted to [facility] with sided weakness. New L thalamic infarct noted, as well as previous and more numerous chronic lacunar infarcts.  PMHx includes: Migraines, DM, GERD, Lactose Intolerance, Kidney Stones, Diverticulitis, Anemia, Incontinence.   OT comments  Upon entering the room, pt supine in bed with husband present in the room. Pt agreeable to OT intervention and is very pleasant throughout. Pt is holding food container with R hand and feeding self with L UE although pt reports she is R handed. Focus on forced use of R UE to increase strength and coordination for functional tasks. Pt was able to demonstrate self feeding with finger food and use of R UE without drops but increased time. Pt also able to reach across midline to pick up cup with R hand and bring to mouth. Pt performed supine >sit with min A to EOB. Pt standing and transferring to [facility] secondary to her report for urgency. Pt needing mod A for transfer without use of AD. Pt able to void and stands with min A for hygiene and clothing management. Pt turning self in small space with mod A and seated in recliner chair to finish breakfast while seated. Chair alarm activated and all needs within reach. Pt's recommendation changed to short term rehab for pt to be able to be able to increase I in occupational performance before returning home.    Follow Up Recommendations  SNF;Supervision/Assistance - 24 hour    Equipment Recommendations  Other (comment) (defer to next venue of care)       Precautions / Restrictions Precautions Precautions: Fall Precaution Comments: R side weakness       Mobility Bed Mobility Overal bed mobility: Needs Assistance Bed Mobility: Supine to Sit     Supine to sit: Min assist          Transfers Overall transfer  level: Needs assistance Equipment used: None Transfers: Sit to/from [facility] Sit to Stand: Mod assist;Min assist Stand pivot transfers: Mod assist            Balance Overall balance assessment: Needs assistance Sitting-balance support: Feet supported Sitting balance-Leahy Scale: Fair     Standing balance support: Bilateral upper extremity supported Standing balance-Leahy Scale: Poor                             ADL either performed or assessed with clinical judgement   ADL Overall ADL's : Needs assistance/impaired Eating/Feeding: Supervision/ safety Eating/Feeding Details (indicate cue type and reason): cuing for encouragement and technique to use R UE for feeding                     Toilet Transfer: Moderate assistance;BSC;Ambulation   Toileting- Clothing Manipulation and Hygiene: Moderate assistance;Sit to/from stand       Functional mobility during ADLs: Moderate assistance       Vision Patient Visual Report: No change from baseline            Cognition Arousal/Alertness: Awake/alert Behavior During Therapy: WFL for tasks assessed/performed Overall Cognitive Status: Within Functional Limits for tasks assessed  Pertinent Vitals/ Pain       Pain Assessment: No/denies pain  Home Living Family/patient expects to be discharged to:: Private residence Living Arrangements: Spouse/significant other Available Help at Discharge: Family Type of Home: House Home Access: Stairs to enter Technical brewer of Steps: 1+1 Entrance Stairs-Rails: None                                Frequency  Min 2X/week        Progress Toward Goals  OT Goals(current goals can now be found in the care plan section)  Progress towards OT goals: Progressing toward goals  Acute Rehab OT Goals Patient Stated Goal: to get stronger OT Goal Formulation:  With patient Time For Goal Achievement: 09/11/20 Potential to Achieve Goals: Good ADL Goals Pt Will Perform Grooming: with supervision Pt Will Perform Lower Body Dressing: with supervision Pt Will Transfer to Toilet: with supervision Pt Will Perform Toileting - Clothing Manipulation and hygiene: with supervision  Plan Discharge plan needs to be updated       AM-PAC OT "6 Clicks" Daily Activity     Outcome Measure   Help from another person eating meals?: None Help from another person taking care of personal grooming?: None Help from another person toileting, which includes using toliet, bedpan, or urinal?: A Little Help from another person bathing (including washing, rinsing, drying)?: A Little Help from another person to put on and taking off regular upper body clothing?: A Little Help from another person to put on and taking off regular lower body clothing?: A Lot 6 Click Score: 19    End of Session Equipment Utilized During Treatment: Other (comment) (BSC)  OT Visit Diagnosis: Unsteadiness on feet (R26.81);Muscle weakness (generalized) (M62.81)   Activity Tolerance Patient tolerated treatment well   Patient Left with call bell/phone within reach;in chair;with chair alarm set;with family/visitor present   Nurse Communication Mobility status        Time: 9528-4132 OT Time Calculation (min): 38 min  Charges: OT General Charges $OT Visit: 1 Visit OT Treatments $Self Care/Home Management : 38-52 mins  Darleen Crocker, MS, OTR/L , CBIS ascom [phone]  08/31/20, 2:00 PM

## 2020-08-31 NOTE — TOC Progression Note (Signed)
Transition of Care [facility]) - Progression Note    Patient Details  Name: Dana [name] MRN: [id number] Date of Birth: 02/24/1946  Transition of Care [facility]) CM/SW Contact  Shelbie Hutching, RN Phone Number: 08/31/2020, 4:38 PM  Clinical Narrative:    Dana [name] has accepted and they will start insurance auth.    Expected Discharge Plan: [place] Barriers to Discharge: Continued Medical Work up,ED Claims of Negligence on the Part of Facility/Family  Expected Discharge Plan and Services Expected Discharge Plan: [place] Choice: [place] Living arrangements for the past 2 months: Single Family Home                           HH Arranged: PT [facility] Agency: [facility] Date [place]: 08/30/20 Time [place]: 1962 Representative spoke with at [place]: [place] Determinants of Health ([place]) Interventions    Readmission Risk Interventions No flowsheet data found.

## 2020-08-31 NOTE — TOC Progression Note (Signed)
Transition of Care [facility]) - Progression Note    Patient Details  Name: PHILLIP SANDLER MRN: [id number] Date of Birth: 1945/09/22  Transition of Care [facility]) CM/SW Contact  Shelbie Hutching, RN Phone Number: 08/31/2020, 4:00 PM  Clinical Narrative:    RNCM updated patient and patient's family that bed offer from [facility] has not been received.  RNCM reached out to [facility] to see if they can offer.  [facility] will review referral and offer in the hub if they can accept.  Patient is not managed by [place] for insurance authorization.  Facility will need to get insurance auth.  Patient has been fully vaccinated and had 2 boosters for COVID.    Expected Discharge Plan: [place] Barriers to Discharge: Continued Medical Work up,ED Claims of Negligence on the Part of Facility/Family  Expected Discharge Plan and Services Expected Discharge Plan: [place] Choice: [place] Living arrangements for the past 2 months: Single Family Home                           HH Arranged: PT [facility] Agency: [facility] Date [place]: 08/30/20 Time [place]: 4801 Representative spoke with at [place]: [place] Determinants of Health ([place]) Interventions    Readmission Risk Interventions No flowsheet data found.

## 2020-08-31 NOTE — Progress Notes (Signed)
*  PRELIMINARY RESULTS* Echocardiogram 2D Echocardiogram has been performed.  Sherrie Sport 08/31/2020, 9:30 AM

## 2020-09-01 DIAGNOSIS — I69398 Other sequelae of cerebral infarction: Secondary | ICD-10-CM | POA: Diagnosis not present

## 2020-09-01 DIAGNOSIS — E785 Hyperlipidemia, unspecified: Secondary | ICD-10-CM | POA: Diagnosis not present

## 2020-09-01 DIAGNOSIS — I69351 Hemiplegia and hemiparesis following cerebral infarction affecting right dominant side: Secondary | ICD-10-CM | POA: Diagnosis not present

## 2020-09-01 DIAGNOSIS — E1140 Type 2 diabetes mellitus with diabetic neuropathy, unspecified: Secondary | ICD-10-CM | POA: Diagnosis not present

## 2020-09-01 DIAGNOSIS — N200 Calculus of kidney: Secondary | ICD-10-CM | POA: Diagnosis not present

## 2020-09-01 DIAGNOSIS — D649 Anemia, unspecified: Secondary | ICD-10-CM | POA: Diagnosis not present

## 2020-09-01 DIAGNOSIS — M6281 Muscle weakness (generalized): Secondary | ICD-10-CM | POA: Diagnosis not present

## 2020-09-01 DIAGNOSIS — I679 Cerebrovascular disease, unspecified: Secondary | ICD-10-CM | POA: Diagnosis not present

## 2020-09-01 DIAGNOSIS — G629 Polyneuropathy, unspecified: Secondary | ICD-10-CM | POA: Diagnosis not present

## 2020-09-01 DIAGNOSIS — I69320 Aphasia following cerebral infarction: Secondary | ICD-10-CM | POA: Diagnosis not present

## 2020-09-01 DIAGNOSIS — E119 Type 2 diabetes mellitus without complications: Secondary | ICD-10-CM | POA: Diagnosis not present

## 2020-09-01 DIAGNOSIS — K219 Gastro-esophageal reflux disease without esophagitis: Secondary | ICD-10-CM | POA: Diagnosis not present

## 2020-09-01 DIAGNOSIS — E118 Type 2 diabetes mellitus with unspecified complications: Secondary | ICD-10-CM | POA: Diagnosis not present

## 2020-09-01 DIAGNOSIS — G8191 Hemiplegia, unspecified affecting right dominant side: Secondary | ICD-10-CM | POA: Diagnosis not present

## 2020-09-01 LAB — BASIC METABOLIC PANEL
Anion gap: 8 (ref 5–15)
BUN: 21 mg/dL (ref 8–23)
CO2: 24 mmol/L (ref 22–32)
Calcium: 9.1 mg/dL (ref 8.9–10.3)
Chloride: 109 mmol/L (ref 98–111)
Creatinine, Ser: 0.72 mg/dL (ref 0.44–1.00)
GFR, Estimated: >60 mL/min (ref >60)
Glucose, Bld: 104 mg/dL — ABNORMAL HIGH (ref 70–99)
Potassium: 3.7 mmol/L (ref 3.5–5.1)
Sodium: 141 mmol/L (ref 135–145)

## 2020-09-01 LAB — CBC
HCT: 37.3 % (ref 36.0–46.0)
Hemoglobin: 12.2 g/dL (ref 12.0–15.0)
MCH: 29.6 pg (ref 26.0–34.0)
MCHC: 32.7 g/dL (ref 30.0–36.0)
MCV: 90.5 fL (ref 80.0–100.0)
Platelets: 225 10*3/uL (ref 150–400)
RBC: 4.12 MIL/uL (ref 3.87–5.11)
RDW: 13.2 % (ref 11.5–15.5)
WBC: 6 10*3/uL (ref 4.0–10.5)
nRBC: 0 % (ref 0.0–0.2)

## 2020-09-01 LAB — RESP PANEL BY RT-PCR (FLU A&B, COVID) ARPGX2
Influenza A by PCR: NEGATIVE
Influenza B by PCR: NEGATIVE
SARS Coronavirus 2 by RT PCR: NEGATIVE

## 2020-09-01 MED ORDER — ACETAMINOPHEN 325 MG PO TABS: 325.0000 mg | ORAL_TABLET | Freq: Four times a day (QID) | ORAL | Status: DC | PRN

## 2020-09-01 MED ORDER — CLOPIDOGREL BISULFATE 75 MG PO TABS: 75.0000 mg | ORAL_TABLET | Freq: Every day | ORAL | 0 refills | Status: AC

## 2020-09-01 MED ORDER — SENNOSIDES-DOCUSATE SODIUM 8.6-50 MG PO TABS: 1.0000 | ORAL_TABLET | Freq: Every evening | ORAL | Status: DC | PRN

## 2020-09-01 MED ORDER — LACTASE 3000 UNITS PO TABS: 6000.0000 [IU] | ORAL_TABLET | Freq: Three times a day (TID) | ORAL | Status: DC

## 2020-09-01 NOTE — Discharge Instructions (Signed)
Follow with Primary MD [name], MD/SNF physician  Get CBC, CMP, checked  by Primary MD next visit.    Activity: As tolerated with Full fall precautions use walker/cane & assistance as needed   Disposition SNF   Diet: Heart Healthy /carb modified , with feeding assistance and aspiration precautions.    On your next visit with your primary care physician please Get Medicines reviewed and adjusted.   Please request your Prim.MD to go over all Hospital Tests and Procedure/Radiological results at the follow up, please get all Hospital records sent to your Prim MD by signing hospital release before you go home.   If you experience worsening of your admission symptoms, develop shortness of breath, life threatening emergency, suicidal or homicidal thoughts you must seek medical attention immediately by calling 911 or calling your MD immediately  if symptoms less severe.  You Must read complete instructions/literature along with all the possible adverse reactions/side effects for all the Medicines you take and that have been prescribed to you. Take any new Medicines after you have completely understood and accpet all the possible adverse reactions/side effects.   Do not drive, operating heavy machinery, perform activities at heights, swimming or participation in water activities or provide baby sitting services if your were admitted for syncope or siezures until you have seen by Primary MD or a Neurologist and advised to do so again.  Do not drive when taking Pain medications.    Do not take more than prescribed Pain, Sleep and Anxiety Medications  Special Instructions: If you have smoked or chewed Tobacco  in the last 2 yrs please stop smoking, stop any regular Alcohol  and or any Recreational drug use.  Wear Seat belts while driving.   Please note  You were cared for by a hospitalist during your hospital stay. If you have any questions about your discharge medications or the  care you received while you were in the hospital after you are discharged, you can call the unit and asked to speak with the hospitalist on call if the hospitalist that took care of you is not available. Once you are discharged, your primary care physician will handle any further medical issues. Please note that NO REFILLS for any discharge medications will be authorized once you are discharged, as it is imperative that you return to your primary care physician (or establish a relationship with a primary care physician if you do not have one) for your aftercare needs so that they can reassess your need for medications and monitor your lab values.

## 2020-09-01 NOTE — TOC Transition Note (Signed)
Transition of Care [facility]) - CM/SW Discharge Note   Patient Details  Name: Dana [name] MRN: [id number] Date of Birth: 1946-03-30  Transition of Care [facility] CM/SW Contact:  Pete Pelt, RN Phone Number: 09/01/2020, 2:04 PM   Clinical Narrative:   TOC in to see patient.  Patient can discharge to [facility] today, COVID test negative 4/26.  Going to room 410 at [facility] per [place].  Mcarthur Rossetti Josem Kaufmann #[id number] nrd 09/07/20.  Patient and spouse amenable, spouse would like to drive patient to [facility], states they will not make any stops, and go directly there.  No further questions or concerns regarding transfer.  Care team aware of patient transport.      Barriers to Discharge: Continued Medical Work up,ED Claims of Negligence on the Part of Facility/Family   Patient Goals and CMS Choice Patient states their goals for this hospitalization and ongoing recovery are:: Patient's son discussed wanting to get her help for her mobilization CMS Medicare.gov Compare Post Acute Care list provided to:: Patient Choice offered to / list presented to : Patient  Discharge Placement                       Discharge Plan and Services     Post Acute Care Choice: Skilled Nursing Facility                    HH Arranged: PT [facility] Agency: [facility] [place] Date [place]: 08/30/20 Time [place]: [id number] Representative spoke with at [place]: [place] Determinants of Health ([place]) Interventions     Readmission Risk Interventions No flowsheet data found.

## 2020-09-01 NOTE — Progress Notes (Signed)
Occupational Therapy Treatment Patient Details Name: Dana [name] MRN: [id number] DOB: [birth year] Today's Date: 09/01/2020    History of present illness Pt. is a 75 y.o. female who was admitted to [facility] with Right sided weakness. New L thalamic infarct noted, as well as previous and more numerous chronic lacunar infarcts.  PMHx includes: Migraines, DM, GERD, Lactose Intolerance, Kidney Stones, Diverticulitis, Anemia, Incontinence.   OT comments  Pt. Required CGA toileting transfers top the BSCommode. CGA with toilet hygiene, and clothing negotiation following toileting tasks. Pt. Education was provided about opportunities to engage her RUE during ADL, and IADL tasks. Pt. Was independent with hand hygiene with set-up. Pt. Is preparing to discharge to [facility].  Pt. continues to benefit from OT services to work on improving RUE engagement during ADLs, and IADL tasks. Pt. Continues to be most appropriate for SNF level of care upon discharge, with follow-up OT services.   Follow Up Recommendations  SNF;Supervision/Assistance - 24 hour    Equipment Recommendations    Continue to assess   Recommendations for Other Services      Precautions / Restrictions Precautions Precautions: Fall Precaution Comments: R side weakness       Mobility Bed Mobility               General bed mobility comments: Pt. up in chair upon arrival    Transfers Overall transfer level: Needs assistance Equipment used: Rolling walker (2 wheeled) Transfers: Sit to/from Stand   Stand pivot transfers: Min guard            Balance Overall balance assessment: Needs assistance Sitting-balance support: Feet supported Sitting balance-Leahy Scale: Fair                                     ADL either performed or assessed with clinical judgement   ADL                           Toilet Transfer: Min guard;BSC   Toileting- Clothing Manipulation and Hygiene: Min guard        Functional mobility during ADLs: Min guard       Vision Baseline Vision/History: Cataracts (Anticiapted surgery 5/17) Patient Visual Report: No change from baseline     Perception     Praxis      Cognition Arousal/Alertness: Awake/alert Behavior During Therapy: WFL for tasks assessed/performed Overall Cognitive Status: Within Functional Limits for tasks assessed                                          Exercises     Shoulder Instructions       General Comments      Pertinent Vitals/ Pain       Pain Assessment: No/denies pain  Home Living                                          Prior Functioning/Environment              Frequency  Min 2X/week        Progress Toward Goals  OT Goals(current goals can now be found in the care plan section)  Progress towards OT  goals: Progressing toward goals  Acute Rehab OT Goals Patient Stated Goal: to get stronger OT Goal Formulation: With patient Potential to Achieve Goals: Good  Plan Discharge plan needs to be updated    Co-evaluation                 AM-PAC OT "6 Clicks" Daily Activity     Outcome Measure   Help from another person eating meals?: None Help from another person taking care of personal grooming?: None Help from another person toileting, which includes using toliet, bedpan, or urinal?: A Little Help from another person bathing (including washing, rinsing, drying)?: A Little Help from another person to put on and taking off regular upper body clothing?: A Little Help from another person to put on and taking off regular lower body clothing?: A Lot 6 Click Score: 19    End of Session Equipment Utilized During Treatment: Other (comment)  OT Visit Diagnosis: Unsteadiness on feet (R26.81);Muscle weakness (generalized) (M62.81)   Activity Tolerance Patient tolerated treatment well   Patient Left with call bell/phone within reach;in chair;with chair  alarm set;with family/visitor present   Nurse Communication Mobility status        Time: 1517-6160 OT Time Calculation (min): 27 min  Charges: OT General Charges $OT Visit: 1 Visit OT Treatments $Self Care/Home Management : 23-37 mins  Harrel Carina, MS, OTR/L  Harrel Carina 09/01/2020, 2:38 PM

## 2020-09-01 NOTE — Discharge Summary (Signed)
Physician Discharge Summary  Dana [name] [id number] DOB: 1946-02-12 DOA: 08/29/2020  PCP: [name], MD  Admit date: 08/29/2020 Discharge date: 09/01/2020  Admitted From: Home Disposition:  [facility]patient Follow-up:  1. Follow with neurology clinic in 4 to 6 weeks 2. continue with PT/OT/speech evaluation at facility  Home Health:No Equipment/Devices:No  Discharge Condition:Stable CODE STATUS:FULL Diet recommendation: Heart Healthy / Carb Modified   Brief/Interim Summary:  Dana [name] a 75 y.o.femalewith medical history significant ofmigraine, HLD, DM, GERD,lactose intolerance, kidney stone (s/p lithotripsy), diverticulitis, anemia, GERD incontinence, memory loss, who presents with right-sided weakness.  MRI significant for acute CVA, she was admitted for further work-up.    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 -Allergy input greatly appreciated, CTA head and neck with minimal atherosclerosis, without large vessel occlusion, or significant stenosis in head and neck, likely small vessel etiology. -Recommendation for dual antiplatelet therapy, aspirin and  Plavix, loaded with Plavix 300 mg once,, continue aspirin and Plavix for 3 weeks, then aspirin alone.   -2D echo with preserved EF, no valvular disease, no intracardiac shunt, left atrium within normal size . -telemetry monitoring, no evidence of A. fib or arrhythmias during hospital stay -LDL 77, continue with statin -PT/OT recommending SNF  GERD (gastroesophageal reflux disease) -protonix  Hyperlipidemia -LDL is 77, continue with statin  Diet controlled diabetes mellitus without complication ([name]): =Recent A1c 6.1, well controlled. Patient is nottaking medications currently.Blood sugar 120. -Continue with carb modified diet    Discharge Diagnoses:  Principal Problem:   Right sided weakness Active  Problems:   Migraine   GERD (gastroesophageal reflux disease)   Hyperlipidemia   Diabetes mellitus without complication ([name])   Stroke ([name])   CVA (cerebral vascular accident) [name])    Discharge Instructions  Discharge Instructions    Diet - low sodium heart healthy   Complete by: As directed    Discharge instructions   Complete by: As directed    Follow with Primary MD [name], MD/SNF physician  Get CBC, CMP, checked  by Primary MD next visit.    Activity: As tolerated with Full fall precautions use walker/cane & assistance as needed   Disposition SNF   Diet: Heart Healthy /carb modified , with feeding assistance and aspiration precautions.    On your next visit with your primary care physician please Get Medicines reviewed and adjusted.   Please request your Prim.MD to go over all Hospital Tests and Procedure/Radiological results at the follow up, please get all Hospital records sent to your Prim MD by signing hospital release before you go home.   If you experience worsening of your admission symptoms, develop shortness of breath, life threatening emergency, suicidal or homicidal thoughts you must seek medical attention immediately by calling 911 or calling your MD immediately  if symptoms less severe.  You Must read complete instructions/literature along with all the possible adverse reactions/side effects for all the Medicines you take and that have been prescribed to you. Take any new Medicines after you have completely understood and accpet all the possible adverse reactions/side effects.   Do not drive, operating heavy machinery, perform activities at heights, swimming or participation in water activities or provide baby sitting services if your were admitted for syncope or siezures until you have seen by Primary MD or a Neurologist and advised to do so again.  Do not drive when taking Pain medications.    Do not take more than  prescribed Pain, Sleep  and Anxiety Medications  Special Instructions: If you have smoked or chewed Tobacco  in the last 2 yrs please stop smoking, stop any regular Alcohol  and or any Recreational drug use.  Wear Seat belts while driving.   Please note  You were cared for by a hospitalist during your hospital stay. If you have any questions about your discharge medications or the care you received while you were in the hospital after you are discharged, you can call the unit and asked to speak with the hospitalist on call if the hospitalist that took care of you is not available. Once you are discharged, your primary care physician will handle any further medical issues. Please note that NO REFILLS for any discharge medications will be authorized once you are discharged, as it is imperative that you return to your primary care physician (or establish a relationship with a primary care physician if you do not have one) for your aftercare needs so that they can reassess your need for medications and monitor your lab values.   Increase activity slowly   Complete by: As directed      Allergies as of 09/01/2020      Reactions   Amitriptyline Other (See Comments)   Worsened migraine and confusion   Lactose Intolerance (gi) Other (See Comments)   Pain of abdomen   Oxybutynin Other (See Comments)   Urinary retention/hesitancy   Tape Other (See Comments)   "bandaids make my skin red"   Tramadol Other (See Comments)   Causes confusion and dizziness   Niacin And Related Itching   Septra [sulfamethoxazole-trimethoprim] Itching      Medication List    STOP taking these medications   furosemide 20 MG tablet Commonly known as: LASIX   ibuprofen 600 MG tablet Commonly known as: IBU     TAKE these medications   acetaminophen 325 MG tablet Commonly known as: TYLENOL Take 1 tablet (325 mg total) by mouth every 6 (six) hours as needed for mild pain. What changed: how much to take   aspirin 81 MG tablet Take 81 mg  by mouth daily.   atorvastatin 40 MG tablet Commonly known as: LIPITOR TAKE 1 TABLET (40 MG TOTAL) BY MOUTH DAILY.   butalbital-acetaminophen-caffeine 50-325-40 MG tablet Commonly known as: FIORICET Take 1 tablet by mouth every 6 (six) hours as needed for headache.   clopidogrel 75 MG tablet Commonly known as: PLAVIX Take 1 tablet (75 mg total) by mouth daily for 18 days. Take for 18 days and stop Start taking on: September 02, 2020   co-enzyme Q-10 30 MG capsule Take 30 mg by mouth daily.   fluticasone 50 MCG/ACT nasal spray Commonly known as: FLONASE USE 2 SPRAYS IN EACH NOSTRIL EVERY DAY   lactase 3000 units tablet Commonly known as: LACTAID Take 2 tablets (6,000 Units total) by mouth 3 (three) times daily with meals.   methocarbamol 500 MG tablet Commonly known as: ROBAXIN TAKE 1 TABLET (500 MG TOTAL) BY MOUTH 3 (THREE) TIMES DAILY AS NEEDED FOR MUSCLE SPASMS (SEDATION PRECAUTIONS).   multivitamin tablet Take 1 tablet by mouth daily.   omeprazole 20 MG capsule Commonly known as: PRILOSEC Take 1 capsule (20 mg total) by mouth daily.   ondansetron 4 MG disintegrating tablet Commonly known as: Zofran ODT Take 1 tablet (4 mg total) by mouth every 8 (eight) hours as needed.   senna-docusate 8.6-50 MG tablet Commonly known as: Senokot-S Take 1 tablet by mouth at bedtime  as needed for mild constipation.   topiramate 25 MG tablet Commonly known as: TOPAMAX Take 3 tablets (75 mg total) by mouth at bedtime.   triamcinolone cream 0.1 % Commonly known as: KENALOG APPLY TO AFFECTED AREA TWICE A DAY   vitamin B-12 1000 MCG tablet Commonly known as: CYANOCOBALAMIN Take 1 tablet (1,000 mcg total) by mouth every Monday, Wednesday, and Friday.       Contact information for after-discharge care    [facility] .   Service: Skilled Nursing Contact information: [place] [phone]                 Allergies  Allergen Reactions  . Amitriptyline Other (See Comments)    Worsened migraine and confusion  . Lactose Intolerance (Gi) Other (See Comments)    Pain of abdomen  . Oxybutynin Other (See Comments)    Urinary retention/hesitancy  . Tape Other (See Comments)    "bandaids make my skin red"  . Tramadol Other (See Comments)    Causes confusion and dizziness  . Niacin And Related Itching  . Septra [Sulfamethoxazole-Trimethoprim] Itching    Consultations:  Neurology   Procedures/Studies: CT ANGIO HEAD W OR WO CONTRAST  Result Date: 08/29/2020 CLINICAL DATA:  Right-sided weakness. Acute left thalamic infarct on MRI. EXAM: CT ANGIOGRAPHY HEAD AND NECK TECHNIQUE: Multidetector CT imaging of the head and neck was performed using the standard protocol during bolus administration of intravenous contrast. Multiplanar CT image reconstructions and MIPs were obtained to evaluate the vascular anatomy. Carotid stenosis measurements (when applicable) are obtained utilizing NASCET criteria, using the distal internal carotid diameter as the denominator. CONTRAST:  16mL OMNIPAQUE IOHEXOL 350 MG/ML SOLN COMPARISON:  None. FINDINGS: CTA NECK FINDINGS Aortic arch: Standard 3 vessel aortic arch with mild atherosclerotic plaque and widely patent arch vessel origins. Right carotid system: Patent without evidence of stenosis, dissection, or significant atherosclerosis. Left carotid system: Patent without evidence of stenosis, dissection, or significant atherosclerosis. Vertebral arteries: Patent and codominant without evidence of stenosis, dissection, or significant atherosclerosis. Skeleton: Mild cervical disc degeneration. Other neck: No evidence of cervical lymphadenopathy or mass. Upper chest: Mild biapical pleuroparenchymal lung scarring. Review of the MIP images confirms the above findings CTA HEAD FINDINGS Anterior circulation:  The internal carotid arteries are patent from skull base to carotid termini with minimal right and mild left-sided atherosclerotic plaque not resulting in significant stenosis. ACAs and MCAs are patent without evidence of a proximal branch occlusion or significant proximal stenosis. No aneurysm is identified. Posterior circulation: Intracranial vertebral arteries are widely patent to the basilar. Patent AICA and SCA origins are seen bilaterally. The basilar artery is widely patent. Posterior communicating arteries are diminutive or absent. Both PCAs are patent without evidence of a significant proximal stenosis. No aneurysm is identified. Venous sinuses: Patent. Anatomic variants: Hypoplastic right A1. Review of the MIP images confirms the above findings IMPRESSION: 1. Minimal atherosclerosis without large vessel occlusion or significant stenosis in the head or neck. 2. Aortic Atherosclerosis (ICD10-I70.0). Electronically Signed   By: Logan Bores M.D.   On: 08/29/2020 15:25   CT Head Wo Contrast  Result Date: 08/29/2020 CLINICAL DATA:  Stroke suspected. No aero deficit. Right side weakness and lips healing tingly for 1 day. Right lower extremity weakness EXAM: CT HEAD WITHOUT CONTRAST TECHNIQUE: Contiguous axial images were obtained from the base of the skull through the vertex without  intravenous contrast. COMPARISON:  CT head 04/22/2018 FINDINGS: Brain: Patchy and confluent areas of decreased attenuation are noted throughout the deep and periventricular white matter of the cerebral hemispheres bilaterally, compatible with chronic microvascular ischemic disease. No evidence of large-territorial acute infarction. No parenchymal hemorrhage. No mass lesion. No extra-axial collection. No mass effect or midline shift. No hydrocephalus. Basilar cisterns are patent. Vascular: No hyperdense vessel. Atherosclerotic calcifications are present within the cavernous internal carotid arteries. Skull: No acute fracture or  focal lesion. Sinuses/Orbits: Paranasal sinuses and mastoid air cells are clear. The orbits are unremarkable. Other: None. IMPRESSION: No acute intracranial abnormality. Electronically Signed   By: Iven Finn M.D.   On: 08/29/2020 06:25   CT ANGIO NECK W OR WO CONTRAST  Result Date: 08/29/2020 CLINICAL DATA:  Right-sided weakness. Acute left thalamic infarct on MRI. EXAM: CT ANGIOGRAPHY HEAD AND NECK TECHNIQUE: Multidetector CT imaging of the head and neck was performed using the standard protocol during bolus administration of intravenous contrast. Multiplanar CT image reconstructions and MIPs were obtained to evaluate the vascular anatomy. Carotid stenosis measurements (when applicable) are obtained utilizing NASCET criteria, using the distal internal carotid diameter as the denominator. CONTRAST:  75mL OMNIPAQUE IOHEXOL 350 MG/ML SOLN COMPARISON:  None. FINDINGS: CTA NECK FINDINGS Aortic arch: Standard 3 vessel aortic arch with mild atherosclerotic plaque and widely patent arch vessel origins. Right carotid system: Patent without evidence of stenosis, dissection, or significant atherosclerosis. Left carotid system: Patent without evidence of stenosis, dissection, or significant atherosclerosis. Vertebral arteries: Patent and codominant without evidence of stenosis, dissection, or significant atherosclerosis. Skeleton: Mild cervical disc degeneration. Other neck: No evidence of cervical lymphadenopathy or mass. Upper chest: Mild biapical pleuroparenchymal lung scarring. Review of the MIP images confirms the above findings CTA HEAD FINDINGS Anterior circulation: The internal carotid arteries are patent from skull base to carotid termini with minimal right and mild left-sided atherosclerotic plaque not resulting in significant stenosis. ACAs and MCAs are patent without evidence of a proximal branch occlusion or significant proximal stenosis. No aneurysm is identified. Posterior circulation: Intracranial  vertebral arteries are widely patent to the basilar. Patent AICA and SCA origins are seen bilaterally. The basilar artery is widely patent. Posterior communicating arteries are diminutive or absent. Both PCAs are patent without evidence of a significant proximal stenosis. No aneurysm is identified. Venous sinuses: Patent. Anatomic variants: Hypoplastic right A1. Review of the MIP images confirms the above findings IMPRESSION: 1. Minimal atherosclerosis without large vessel occlusion or significant stenosis in the head or neck. 2. Aortic Atherosclerosis (ICD10-I70.0). Electronically Signed   By: Logan Bores M.D.   On: 08/29/2020 15:25   MR BRAIN WO CONTRAST  Result Date: 08/29/2020 CLINICAL DATA:  Right-sided weakness. EXAM: MRI HEAD WITHOUT CONTRAST TECHNIQUE: Multiplanar, multiecho pulse sequences of the brain and surrounding structures were obtained without intravenous contrast. COMPARISON:  Head CT 08/29/2020 and MRI 05/20/2017 FINDINGS: Brain: There is a 9 mm acute infarct laterally in the left thalamus. Chronic microhemorrhages are noted in the right thalamus, pons, and left periventricular white matter and may be secondary to chronic hypertension. Patchy T2 hyperintensities in the cerebral white matter bilaterally are similar to the prior MRI and are nonspecific but compatible with moderate chronic small vessel ischemic disease. Chronic bilateral basal ganglia lacunar infarcts are unchanged from the prior MRI. Chronic lacunar infarcts in the left thalamus and left pons are new. The ventricles and sulci are within normal limits for age. No mass, midline shift, or extra-axial fluid collection is identified.  Vascular: Major intracranial vascular flow voids are preserved. Skull and upper cervical spine: Unremarkable bone marrow signal. Sinuses/Orbits: Unremarkable orbits. Paranasal sinuses and mastoid air cells are clear. Other: None. IMPRESSION: 1. Acute left thalamic infarct. 2. Moderate chronic small  vessel ischemic disease with chronic lacunar infarcts as above, increased in number from 2019. Electronically Signed   By: Logan Bores M.D.   On: 08/29/2020 12:43   ECHOCARDIOGRAM COMPLETE  Result Date: 08/31/2020    ECHOCARDIOGRAM REPORT   Patient Name:   KEELI ACHTER Monjaras Date of Exam: 08/31/2020 Medical Rec #:  [id number]     Height:       63.0 in Accession #:    [id number]    Weight:       170.0 lb Date of Birth:  April 28, 1946     BSA:          1.805 m Patient Age:    75 years      BP:           119/70 mmHg Patient Gender: F             HR:           67 bpm. Exam Location:  [facility] Procedure: 2D Echo, Color Doppler and Cardiac Doppler Indications:     Stroke I63.9  History:         Patient has no prior history of Echocardiogram examinations.                  Risk Factors:Dyslipidemia and Diabetes. Migraine.  Sonographer:     Sherrie Sport RDCS (AE) Referring Phys:  [place] Diagnosing Phys: Serafina Royals MD IMPRESSIONS  1. Left ventricular ejection fraction, by estimation, is 60 to 65%. The left ventricle has normal function. The left ventricle has no regional wall motion abnormalities. Left ventricular diastolic parameters were normal.  2. Right ventricular systolic function is normal. The right ventricular size is normal.  3. The mitral valve is normal in structure. Trivial mitral valve regurgitation.  4. The aortic valve is normal in structure. Aortic valve regurgitation is not visualized. FINDINGS  Left Ventricle: Left ventricular ejection fraction, by estimation, is 60 to 65%. The left ventricle has normal function. The left ventricle has no regional wall motion abnormalities. The left ventricular internal cavity size was normal in size. There is  no left ventricular hypertrophy. Left ventricular diastolic parameters were normal. Right Ventricle: The right ventricular size is normal. No increase in right ventricular wall thickness. Right ventricular systolic function is normal. Left Atrium: Left atrial size  was normal in size. Right Atrium: Right atrial size was normal in size. Pericardium: There is no evidence of pericardial effusion. Mitral Valve: The mitral valve is normal in structure. Trivial mitral valve regurgitation. Tricuspid Valve: The tricuspid valve is normal in structure. Tricuspid valve regurgitation is trivial. Aortic Valve: The aortic valve is normal in structure. Aortic valve regurgitation is not visualized. Aortic valve mean gradient measures 2.0 mmHg. Aortic valve peak gradient measures 3.4 mmHg. Aortic valve area, by VTI measures 3.48 cm. Pulmonic Valve: The pulmonic valve was normal in structure. Pulmonic valve regurgitation is not visualized. Aorta: The aortic root and ascending aorta are structurally normal, with no evidence of dilitation. IAS/Shunts: No atrial level shunt detected by color flow Doppler.  LEFT VENTRICLE PLAX 2D LVIDd:         4.15 cm  Diastology LVIDs:         2.76 cm  LV e' medial:  5.66 cm/s LV PW:         1.47 cm  LV E/e' medial:  10.8 LV IVS:        0.84 cm  LV e' lateral:   8.49 cm/s LVOT diam:     2.00 cm  LV E/e' lateral: 7.2 LV SV:         55 LV SV Index:   30 LVOT Area:     3.14 cm  RIGHT VENTRICLE RV Basal diam:  2.67 cm RV S prime:     16.10 cm/s TAPSE (M-mode): 3.7 cm LEFT ATRIUM           Index       RIGHT ATRIUM           Index LA diam:      3.20 cm 1.77 cm/m  RA Area:     12.20 cm LA Vol (A2C): 27.8 ml 15.41 ml/m RA Volume:   23.20 ml  12.86 ml/m LA Vol (A4C): 41.6 ml 23.05 ml/m  AORTIC VALVE                   PULMONIC VALVE AV Area (Vmax):    2.21 cm    PV Vmax:        0.47 m/s AV Area (Vmean):   2.23 cm    PV Peak grad:   0.9 mmHg AV Area (VTI):     3.48 cm    RVOT Peak grad: 1 mmHg AV Vmax:           91.80 cm/s AV Vmean:          64.700 cm/s AV VTI:            0.157 m AV Peak Grad:      3.4 mmHg AV Mean Grad:      2.0 mmHg LVOT Vmax:         64.60 cm/s LVOT Vmean:        45.900 cm/s LVOT VTI:          0.174 m LVOT/AV VTI ratio: 1.11  AORTA Ao Root  diam: 3.17 cm MITRAL VALVE               TRICUSPID VALVE MV Area (PHT): 2.97 cm    TR Peak grad:   10.5 mmHg MV Decel Time: 255 msec    TR Vmax:        162.00 cm/s MV E velocity: 61.30 cm/s MV A velocity: 84.80 cm/s  SHUNTS MV E/A ratio:  0.72        Systemic VTI:  0.17 m                            Systemic Diam: 2.00 cm Serafina Royals MD Electronically signed by Serafina Royals MD Signature Date/Time: 08/31/2020/5:02:36 PM    Final     (Echo, Carotid, EGD, Colonoscopy, ERCP)    Subjective:  Ports right-sided weakness has improved, she denies any new focal weakness. Discharge Exam: Vitals:   09/01/20 0346 09/01/20 0727  BP: (!) 124/53 102/67  Pulse: (!) 57 70  Resp: 12 15  Temp: (!) 97.5 F (36.4 C) 97.6 F (36.4 C)  SpO2: 97% 93%   Vitals:   08/31/20 2115 09/01/20 0005 09/01/20 0346 09/01/20 0727  BP: 124/68 125/65 (!) 124/53 102/67  Pulse: 68 69 (!) 57 70  Resp: 14 16 12 15   Temp: 98.1 F (36.7 C) 97.9 F (36.6 C) (!) 97.5  F (36.4 C) 97.6 F (36.4 C)  TempSrc: Oral Oral Oral   SpO2: 97% 98% 97% 93%  Weight:      Height:        General: Pt is alert, awake, not in acute distress, her speech is slow, but appropriate Cardiovascular: RRR, S1/S2 +, no rubs, no gallops Respiratory: CTA bilaterally, no wheezing, no rhonchi Abdominal: Soft, NT, ND, bowel sounds + Extremities: no edema, no cyanosis, right-sided weakness has improved    The results of significant diagnostics from this hospitalization (including imaging, microbiology, ancillary and laboratory) are listed below for reference.     Microbiology: Recent Results (from the past 240 hour(s))  Resp Panel by RT-PCR (Flu A&B, Covid) Urine, Clean Catch     Status: None   Collection Time: 08/29/20  6:45 AM   Specimen: Urine, Clean Catch; Nasopharyngeal(NP) swabs in vial transport medium  Result Value Ref Range Status   SARS Coronavirus 2 by RT PCR NEGATIVE NEGATIVE Final    Comment: (NOTE) SARS-CoV-2 target nucleic  acids are NOT DETECTED.  The SARS-CoV-2 RNA is generally detectable in upper respiratory specimens during the acute phase of infection. The lowest concentration of SARS-CoV-2 viral copies this assay can detect is 138 copies/mL. A negative result does not preclude SARS-Cov-2 infection and should not be used as the sole basis for treatment or other patient management decisions. A negative result may occur with  improper specimen collection/handling, submission of specimen other than nasopharyngeal swab, presence of viral mutation(s) within the areas targeted by this assay, and inadequate number of viral copies(<138 copies/mL). A negative result must be combined with clinical observations, patient history, and epidemiological information. The expected result is Negative.  Fact Sheet for Patients:  EntrepreneurPulse.com.au  Fact Sheet for Healthcare Providers:  IncredibleEmployment.be  This test is no t yet approved or cleared by the [place] FDA and  has been authorized for detection and/or diagnosis of SARS-CoV-2 by FDA under an Emergency Use Authorization (EUA). This EUA will remain  in effect (meaning this test can be used) for the duration of the COVID-19 declaration under Section 564(b)(1) of the Act, 21 U.S.C.section 360bbb-3(b)(1), unless the authorization is terminated  or revoked sooner.       Influenza A by PCR NEGATIVE NEGATIVE Final   Influenza B by PCR NEGATIVE NEGATIVE Final    Comment: (NOTE) The Xpert Xpress SARS-CoV-2/FLU/RSV plus assay is intended as an aid in the diagnosis of influenza from Nasopharyngeal swab specimens and should not be used as a sole basis for treatment. Nasal washings and aspirates are unacceptable for Xpert Xpress SARS-CoV-2/FLU/RSV testing.  Fact Sheet for Patients: EntrepreneurPulse.com.au  Fact Sheet for Healthcare Providers: IncredibleEmployment.be  This  test is not yet approved or cleared by the [place] FDA and has been authorized for detection and/or diagnosis of SARS-CoV-2 by FDA under an Emergency Use Authorization (EUA). This EUA will remain in effect (meaning this test can be used) for the duration of the COVID-19 declaration under Section 564(b)(1) of the Act, 21 U.S.C. section 360bbb-3(b)(1), unless the authorization is terminated or revoked.  Performed at [facility], [address] [place], [place] [address]      Labs: BNP (last 3 results) Recent Labs    08/29/20 0552  BNP 32.2   Basic Metabolic Panel: Recent Labs  Lab 08/29/20 0552 08/30/20 0506 08/31/20 0401 09/01/20 0325  NA 141 143 141 141  K 3.5 3.8 3.5 3.7  CL 108 110 109 109  CO2 23 24  25 24  GLUCOSE 120* 123* 116* 104*  BUN 15 17 23 21   CREATININE 0.74 0.80 0.79 0.72  CALCIUM 9.6 9.2 8.9 9.1   Liver Function Tests: No results for input(s): AST, ALT, ALKPHOS, BILITOT, PROT, ALBUMIN in the last 168 hours. No results for input(s): LIPASE, AMYLASE in the last 168 hours. No results for input(s): AMMONIA in the last 168 hours. CBC: Recent Labs  Lab 08/29/20 0552 08/30/20 0506 08/31/20 0401 09/01/20 0325  WBC 5.5 8.1 5.7 6.0  HGB 12.4 12.5 12.2 12.2  HCT 36.8 38.4 37.2 37.3  MCV 92.0 92.1 91.6 90.5  PLT 211 228 208 225   Cardiac Enzymes: No results for input(s): CKTOTAL, CKMB, CKMBINDEX, TROPONINI in the last 168 hours. BNP: Invalid input(s): POCBNP CBG: No results for input(s): GLUCAP in the last 168 hours. D-Dimer No results for input(s): DDIMER in the last 72 hours. Hgb A1c Recent Labs    08/30/20 0506  HGBA1C 6.5*   Lipid Profile Recent Labs    08/30/20 0506  CHOL 149  HDL 52  LDLCALC 77  TRIG 101  CHOLHDL 2.9   Thyroid function studies No results for input(s): TSH, T4TOTAL, T3FREE, THYROIDAB in the last 72 hours.  Invalid input(s): FREET3 Anemia work up No results for input(s): VITAMINB12, FOLATE,  FERRITIN, TIBC, IRON, RETICCTPCT in the last 72 hours. Urinalysis    Component Value Date/Time   COLORURINE COLORLESS (A) 08/29/2020 0645   APPEARANCEUR CLEAR (A) 08/29/2020 0645   LABSPEC 1.004 (L) 08/29/2020 0645   PHURINE 8.0 08/29/2020 0645   GLUCOSEU NEGATIVE 08/29/2020 0645   HGBUR NEGATIVE 08/29/2020 0645   BILIRUBINUR NEGATIVE 08/29/2020 0645   BILIRUBINUR negative 09/04/2017 1328   KETONESUR NEGATIVE 08/29/2020 0645   PROTEINUR NEGATIVE 08/29/2020 0645   UROBILINOGEN 0.2 09/04/2017 1328   UROBILINOGEN 0.2 11/13/2014 1005   NITRITE NEGATIVE 08/29/2020 0645   LEUKOCYTESUR NEGATIVE 08/29/2020 0645   Sepsis Labs Invalid input(s): PROCALCITONIN,  WBC,  LACTICIDVEN Microbiology Recent Results (from the past 240 hour(s))  Resp Panel by RT-PCR (Flu A&B, Covid) Urine, Clean Catch     Status: None   Collection Time: 08/29/20  6:45 AM   Specimen: Urine, Clean Catch; Nasopharyngeal(NP) swabs in vial transport medium  Result Value Ref Range Status   SARS Coronavirus 2 by RT PCR NEGATIVE NEGATIVE Final    Comment: (NOTE) SARS-CoV-2 target nucleic acids are NOT DETECTED.  The SARS-CoV-2 RNA is generally detectable in upper respiratory specimens during the acute phase of infection. The lowest concentration of SARS-CoV-2 viral copies this assay can detect is 138 copies/mL. A negative result does not preclude SARS-Cov-2 infection and should not be used as the sole basis for treatment or other patient management decisions. A negative result may occur with  improper specimen collection/handling, submission of specimen other than nasopharyngeal swab, presence of viral mutation(s) within the areas targeted by this assay, and inadequate number of viral copies(<138 copies/mL). A negative result must be combined with clinical observations, patient history, and epidemiological information. The expected result is Negative.  Fact Sheet for Patients:   EntrepreneurPulse.com.au  Fact Sheet for Healthcare Providers:  IncredibleEmployment.be  This test is no t yet approved or cleared by the [place] FDA and  has been authorized for detection and/or diagnosis of SARS-CoV-2 by FDA under an Emergency Use Authorization (EUA). This EUA will remain  in effect (meaning this test can be used) for the duration of the COVID-19 declaration under Section 564(b)(1) of the Act, 21 U.S.C.section 360bbb-3(b)(1), unless  the authorization is terminated  or revoked sooner.       Influenza A by PCR NEGATIVE NEGATIVE Final   Influenza B by PCR NEGATIVE NEGATIVE Final    Comment: (NOTE) The Xpert Xpress SARS-CoV-2/FLU/RSV plus assay is intended as an aid in the diagnosis of influenza from Nasopharyngeal swab specimens and should not be used as a sole basis for treatment. Nasal washings and aspirates are unacceptable for Xpert Xpress SARS-CoV-2/FLU/RSV testing.  Fact Sheet for Patients: EntrepreneurPulse.com.au  Fact Sheet for Healthcare Providers: IncredibleEmployment.be  This test is not yet approved or cleared by the [place] FDA and has been authorized for detection and/or diagnosis of SARS-CoV-2 by FDA under an Emergency Use Authorization (EUA). This EUA will remain in effect (meaning this test can be used) for the duration of the COVID-19 declaration under Section 564(b)(1) of the Act, 21 U.S.C. section 360bbb-3(b)(1), unless the authorization is terminated or revoked.  Performed at [facility], [address], [place], [place] [address]      Time coordinating discharge: Over 30 minutes  SIGNED:   Phillips Climes, MD  [facility] 09/01/2020, 11:16 AM Pager   If 7PM-7AM, please contact night-coverage [email or web address] Password [email or web address]

## 2020-09-02 DIAGNOSIS — G8191 Hemiplegia, unspecified affecting right dominant side: Secondary | ICD-10-CM | POA: Diagnosis not present

## 2020-09-02 DIAGNOSIS — I679 Cerebrovascular disease, unspecified: Secondary | ICD-10-CM | POA: Diagnosis not present

## 2020-09-02 DIAGNOSIS — E118 Type 2 diabetes mellitus with unspecified complications: Secondary | ICD-10-CM | POA: Diagnosis not present

## 2020-09-13 ENCOUNTER — Other Ambulatory Visit: Payer: Self-pay | Admitting: Family Medicine

## 2020-09-14 NOTE — Telephone Encounter (Signed)
Refill request Ibuprofen Medication is no longer on medication list and was D/C'd on 09/01/20. Last office visit 07/20/20 Spoke to patient by telephone and was advised that she did not request this refill request. Patient confirmed that she is no longer taken this medication. Will deny refill.

## 2020-09-16 ENCOUNTER — Ambulatory Visit: Payer: Medicare HMO | Admitting: Speech Pathology

## 2020-09-16 ENCOUNTER — Other Ambulatory Visit: Payer: Self-pay

## 2020-09-16 ENCOUNTER — Encounter: Payer: Self-pay | Admitting: Speech Pathology

## 2020-09-16 ENCOUNTER — Ambulatory Visit: Payer: Medicare HMO | Attending: Family Medicine

## 2020-09-16 DIAGNOSIS — R4701 Aphasia: Secondary | ICD-10-CM | POA: Insufficient documentation

## 2020-09-16 DIAGNOSIS — R262 Difficulty in walking, not elsewhere classified: Secondary | ICD-10-CM | POA: Diagnosis not present

## 2020-09-16 DIAGNOSIS — M6281 Muscle weakness (generalized): Secondary | ICD-10-CM | POA: Insufficient documentation

## 2020-09-16 DIAGNOSIS — R2689 Other abnormalities of gait and mobility: Secondary | ICD-10-CM | POA: Diagnosis not present

## 2020-09-16 DIAGNOSIS — R2681 Unsteadiness on feet: Secondary | ICD-10-CM | POA: Diagnosis not present

## 2020-09-16 DIAGNOSIS — I69851 Hemiplegia and hemiparesis following other cerebrovascular disease affecting right dominant side: Secondary | ICD-10-CM | POA: Diagnosis not present

## 2020-09-16 DIAGNOSIS — R278 Other lack of coordination: Secondary | ICD-10-CM | POA: Diagnosis not present

## 2020-09-16 NOTE — Patient Instructions (Signed)
   Continue to practice describing the item when you can't think of the word  Play games to promote word finding   Scattergories,, Outburst ( no timers)

## 2020-09-16 NOTE — Therapy (Signed)
[facility] [address] [place], [place], [address] Phone: [phone]   Fax:  [phone]  Speech Language Pathology Evaluation  Patient Details  Name: SAFIATOU ISLAM MRN: [id number] Date of Birth: 1946/05/01 Referring Provider (SLP): Jhonnie [name], NP   Encounter Date: 09/16/2020   End of Session - 09/16/20 1426    Visit Number 1    Number of Visits 17    Date for SLP Re-Evaluation 11/11/20    Authorization Type [employer] - auth required    SLP Start Time 1313    SLP Stop Time  1358    SLP Time Calculation (min) 45 min    Activity Tolerance Patient tolerated treatment well           Past Medical History:  Diagnosis Date  . Allergy   . Anemia   . Arthritis   . Cataract   . Controlled type 2 diabetes mellitus without complication, without long-term current use of insulin (HCC)   . Diverticulitis   . History of chicken pox   . Hyperlipidemia   . Kidney stone   . 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 Migraine   . Urge incontinence 04/29/2014    Past Surgical History:  Procedure Laterality Date  . ABDOMINAL HYSTERECTOMY  2006   cervix remains, menorrhagia  . APPENDECTOMY  1970  . BREAST SURGERY  1995   biopsy-left  . BREAST SURGERY  1995   right milk duct removed  . CARPAL TUNNEL RELEASE Right 02/2017  . CHOLECYSTECTOMY  1971  . COLONOSCOPY  11/2005   diverticulosis o/w WNL [facility])  . DEXA  2015   spine -0.4, hip -1.0 WNL  . LITHOTRIPSY    . LUMBAR EPIDURAL INJECTION Left 10/2017   transforaminal L L3/4 (Dr Ollen Bowl)  . TUBAL LIGATION      There were no vitals filed for this visit.       SLP Evaluation [facility] - 09/16/20 1324      SLP Visit Information   SLP Received On 09/16/20    Referring Provider (SLP) Jhonnie [name], NP    Onset Date 08/29/20    Medical Diagnosis Left CVA      Subjective   Patient/Family Stated Goal "To get my words out"      General Information   HPI Pt. is a 76  y.o. female who was admitted to [facility] with sided weakness. New L thalamic infarct noted, as well as previous and more numerous chronic lacunar infarcts.  PMHx includes: Migraines, DM, GERD, Lactose Intolerance, Kidney Stones, Diverticulitis, Anemia, Incontinence. She was hospitalized 4/23-4/36/22, then d/c'd to SNF for 1 week.    Mobility Status walker - PT eval today      Balance Screen   Has the patient fallen in the past 6 months No      Prior Functional Status   Cognitive/Linguistic Baseline Within functional limits    Type of Home House     Lives With Spouse    Available Support Family      Pain Assessment   Pain Assessment No/denies pain      Cognition   Overall Cognitive Status Within Functional Limits for tasks assessed   spouse and family deny cognitive changes or memory changes. Will continue to assess as she increases her participation in ADL's and IADLs     Auditory Comprehension   Overall Auditory Comprehension Impaired    Yes/No Questions Impaired    Basic Immediate Environment Questions 75-100% accurate  Complex Questions 50-74% accurate    Commands Not tested    Conversation Simple      Reading Comprehension   Reading Status Not tested      Expression   Primary Mode of Expression Verbal      Verbal Expression   Overall Verbal Expression Impaired    Initiation No impairment    Level of Generative/Spontaneous Verbalization Conversation    Repetition Impaired    Level of Impairment Sentence level    Naming Impairment    Responsive 76-100% accurate    Confrontation 75-100% accurate    Convergent Not tested    Divergent 50-74% accurate    Pragmatics No impairment    Effective Techniques Open ended questions;Sentence completion;Phonemic cues      Written Expression   Dominant Hand Right    Written Expression Exceptions to [facility]    Dictation Ability Phrase    Self Formulation Ability Phrase      Oral Motor/Sensory Function   Overall Oral Motor/Sensory  Function Appears within functional limits for tasks assessed      Motor Speech   Overall Motor Speech Appears within functional limits for tasks assessed      Standardized Assessments   Standardized Assessments  --   Quick Aphasia Battery Form 1                          SLP Education - 09/16/20 1425    Education Details compensations for aphasia; language activities to do at home    Person(s) Educated Patient;Spouse    Methods Explanation;Demonstration;Verbal cues;Handout    Comprehension Verbalized understanding;Verbal cues required;Need further instruction            SLP Short Term Goals - 09/16/20 1558      SLP SHORT TERM GOAL #1   Title Pt will name 20 items in 3 personally relevant categories with rare min A over 2 sessions    Time 4    Period Weeks    Status New      SLP SHORT TERM GOAL #2   Title Pt will use compensations for apahsia in structured language tasks with rare min A over 2 sessions    Time 4    Period Weeks    Status New      SLP SHORT TERM GOAL #3   Title Pt will use compensations for word finding as needed over 15 minute simple conversation    Time 4    Period Weeks    Status New            SLP Long Term Goals - 09/16/20 1601      SLP LONG TERM GOAL #1   Title Pt will complete complex high level naming tasks with 90% accuracy and rare min A    Time 8    Period Weeks    Status New      SLP LONG TERM GOAL #2   Title Pt will use compensations for aphasia over 20 minute complex conversation as needed with rare min A    Time 8    Period Weeks    Status New      SLP LONG TERM GOAL #3   Title Pt will increase her score on the Communication Participation Item Bank - Short Form by 2 points (initial score 12)    Time 8    Period Weeks    Status New      SLP LONG TERM GOAL #4  Title Pt will write 3 sentennce passage/paragraph/text/email, self correcting errors with rare min A            Plan - 09/16/20 1427     Clinical Impression Statement Irja Zwack is referred for outpt ST s/p left thalamic CVA due to persistent word finding difficulties. She is accompanied by her spouse, [name]. They both report that Dayana has difficulty completing her utternaces due to word finding difficulties. Inez Catalina states that Stella will use empty words, such as "here," there" and "thing" in place of words she can't find. They both deny any cognitive changes or memory impairments. Vendela is managing her medications, recalling her appointments and doing some light cooking with success. Will continue to monitor cognition. The Quick Aphasia Battery revealed mild aphasia. Jany named 12 animals in 1 minute (15-20 is WNL) and 6 "M" words, also below WNL. She and [name] deny using compensations for aphasia, however with questioning cues, she verbalized 2 strategies the SLP at the SNF taught her.On  The Communication Participation Item Bank - Short Form, Roger scored a 81 and noted "A little" (2) difficulty taking with people she know, communicating something quickly, communicating in the community, having a long conversation, giving detailed information and participating in a fast moving conversation. I recommend skilled ST to maximize communication for independence, communication with family,friends and in the community. Pt reports diffiuclty using R UE and reduced handwriting. Recommend OT eval.    Speech Therapy Frequency 2x / week    Duration 8 weeks   17 visits   Treatment/Interventions SLP instruction and feedback;Environmental controls;Language facilitation;Compensatory strategies;Functional tasks;Cognitive reorganization;Compensatory techniques;Patient/family education;Multimodal communcation approach;Internal/external aids    Potential to Achieve Goals Good           Patient will benefit from skilled therapeutic intervention in order to improve the following deficits and impairments:   Aphasia    Problem List Patient Active  Problem List   Diagnosis Date Noted  . CVA (cerebral vascular accident) ([place]) 08/30/2020  . Right sided weakness 08/29/2020  . Stroke ([place]) 08/29/2020  . Hyperlipidemia   . Diabetes mellitus without complication ([place])   . Pain in lateral portion of right ankle 07/20/2020  . Plantar fasciitis of right foot 05/19/2020  . Small vessel disease, cerebrovascular   . Pes anserine bursitis 04/09/2019  . Skin rash 04/09/2019  . Red skin 02/25/2019  . Overactive bladder 10/24/2018  . Amnesia memory loss 05/19/2017  . Obesity, Class I, BMI 30-34.9 05/09/2017  . Lumbar herniated disc 02/01/2017  . Left shoulder pain 09/30/2016  . Medicare annual wellness visit, subsequent 05/03/2016  . 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 History of kidney stones   . Cervical pain (neck) 12/04/2015  . Health maintenance examination 04/29/2014  . Paresthesia 04/29/2014  . Advanced care planning/counseling discussion 04/29/2014  . PFD (pelvic floor dysfunction) 04/29/2014  . Controlled type 2 diabetes mellitus without complication, without long-term current use of insulin ([place]) 12/24/2013  . 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  . Hx of diverticulitis of colon 11/07/2012  . Allergic rhinitis 11/07/2012  . Hyperlipidemia associated with type 2 diabetes mellitus ([place]) 11/07/2012  . Migraine 11/07/2012  . Hereditary and idiopathic peripheral neuropathy 11/07/2012  . LGSIL Pap smear of vaginal cuff 11/07/2012  . GERD (gastroesophageal reflux disease) 11/07/2012  . Family history of premature coronary heart disease 11/07/2012    Jessaca Philippi, Annye Rusk MS, CCC-SLP 09/16/2020, 4:03 PM  [facility] [address] [place], [place], [address] Phone:  [phone]   Fax:  [phone]  Name: KAILEA DANNEMILLER MRN: [id number] Date of Birth: March 15, 1946

## 2020-09-16 NOTE — Therapy (Signed)
[facility] [address] [place], [place], [address] Phone: [phone]   Fax:  [phone]  Physical Therapy Evaluation  Patient Details  Name: Dana [name] MRN: [id number] Date of Birth: 1946-01-22 Referring Provider (PT): Referred by: [name], NP. PCP is [name], MD   Encounter Date: 09/16/2020   PT End of Session - 09/16/20 1403    Visit Number 1    Number of Visits 17    Date for PT Re-Evaluation 12/15/20   POC for 8 weeks, Cert for 90 days   Authorization Type [employer] (10th Visit PN)    Authorization Time Period awaiting authorization    Progress Note Due on Visit 10    PT Start Time 1405   patient needing to use bathroom prior to PT session   PT Stop Time 1448    PT Time Calculation (min) 43 min    Equipment Utilized During Treatment Gait belt    Activity Tolerance Patient tolerated treatment well    Behavior During Therapy [facility] for tasks assessed/performed           Past Medical History:  Diagnosis Date  . Allergy   . Anemia   . Arthritis   . Cataract   . Controlled type 2 diabetes mellitus without complication, without long-term current use of insulin ([place])   . Diverticulitis   . History of chicken pox   . Hyperlipidemia   . Kidney stone   . 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 Migraine   . Urge incontinence 04/29/2014    Past Surgical History:  Procedure Laterality Date  . ABDOMINAL HYSTERECTOMY  2006   cervix remains, menorrhagia  . APPENDECTOMY  1970  . [place]   biopsy-left  . BREAST SURGERY  1995   right milk duct removed  . CARPAL TUNNEL RELEASE Right 02/2017  . CHOLECYSTECTOMY  1971  . COLONOSCOPY  11/2005   diverticulosis o/w WNL [facility])  . DEXA  2015   spine -0.4, hip -1.0 WNL  . LITHOTRIPSY    . LUMBAR EPIDURAL INJECTION Left 10/2017   transforaminal L L3/4 (Dr Maryjean Ka)  . TUBAL LIGATION      There were no vitals filed for this  visit.    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 Discharged to [facility] (SNF). Was discharged home last friday. Is currently using a RW for mobility, for indoors and outdoors. Reports residual weakness, especially in the R Leg. Reports feels she is dragging the R leg especially at night. Denies falls. Reports was not using a device prior to the CVA.    Patient is accompained by: Family member   Husband [name])   Pertinent History Migraines, HLD, DM, GERD, Lactose Intolerance, Kidney Stones, Diverticulitis, Anemia, Incontinence    Limitations Sitting;Standing;Walking    How long can you walk comfortably? 10 minutes    Patient Stated Goals Walk without Assistance;    Currently in Pain? No/denies   does report intermittent tingling and burning down the RLE             [facility] PT Assessment - 09/16/20 1414      Assessment   Medical Diagnosis CVA (L Thalamic Infarct)    Referring Provider (PT) Referred by: [name], NP. PCP is [name], MD    Onset Date/Surgical Date 08/29/20    Hand Dominance Right  Prior Therapy Acute Care/SNF      Precautions   Precautions Fall      Restrictions   Weight Bearing Restrictions No      Balance Screen   Has the patient fallen in the past 6 months No    Has the patient had a decrease in activity level because of a fear of falling?  Yes    Is the patient reluctant to leave their home because of a fear of falling?  Yes      [place] Private residence    Living Arrangements Spouse/significant other    Available Help at Discharge Family    Type of [place] to enter    Entrance Stairs-Number of Steps 3   thresholds (approx 4 inches); various locations.   Entrance Stairs-Rails None    Home Layout Able to live on main level with bedroom/bathroom;Two level    Alternate Level  Stairs-Number of Steps 12-14    Alternate Level Stairs-Rails --   on the left side up the first half, then switches to the right side.   [place] - 2 wheels;Shower seat    Additional Comments interested in getting a tub bench      Prior Function   Level of Independence Independent    Vocation Retired    Lexicographer; Card Games      Cognition   Overall Cognitive Status Within Functional Limits for tasks assessed      Observation/Other Assessments   Focus on Therapeutic Outcomes (FOTO)  49% (Stroke Lower Extremity)      Sensation   Light Touch Appears Intact   reports tingling/numbness (prior to CVA?)     Coordination   Gross Motor Movements are Fluid and Coordinated No    Coordination and Movement Description general decreased coordination with RLE due to weakness    Heel Shin Test Impaired      Tone   Assessment Location Right Lower Extremity      ROM / Strength   AROM / PROM / Strength AROM;Strength      AROM   Overall AROM  Deficits    Overall AROM Comments general ROM deficits due RLE weakness      Strength   Overall Strength Deficits    Strength Assessment Site Hip;Knee;Ankle    Right/Left Hip Right;Left    Right Hip Flexion 3-/5    Right Hip ABduction 2+/5    Right Hip ADduction 3-/5    Left Hip Flexion 4/5    Left Hip ABduction 4/5    Left Hip ADduction 4/5    Right/Left Knee Right;Left    Right Knee Flexion 3-/5    Right Knee Extension 3/5    Left Knee Flexion 4/5    Left Knee Extension 4/5    Right/Left Ankle Right;Left    Right Ankle Dorsiflexion 3-/5    Left Ankle Dorsiflexion 4/5      Bed Mobility   Bed Mobility Rolling Right;Rolling Left;Sit to Supine;Supine to Sit    Rolling Right Independent    Rolling Left Independent    Supine to Sit Independent    Sit to Supine Independent      Transfers   Transfers Sit to Stand;Stand to Sit    Sit to Stand 5: Supervision;4: Min guard    Five time sit to stand comments  14.42 secs w/ UE  support on lap. RW placed in front for safety.  Stand to Sit 5: Supervision;4: Min guard    Comments increased lateral lean to L initially      Ambulation/Gait   Ambulation/Gait Yes    Ambulation/Gait Assistance 5: Supervision;4: Min guard    Ambulation/Gait Assistance Details completed ambulation x 100 ft with RW, decreased step length bilaterally. increased RLE with mild buckling but no assistance required close supervision/CGA. increased fatigue noted.    Ambulation Distance (Feet) 100 Feet    Assistive device Rolling walker    Gait Pattern Step-through pattern;Decreased arm swing - right;Decreased arm swing - left;Decreased step length - right;Decreased step length - left;Decreased stance time - right;Decreased hip/knee flexion - right;Decreased dorsiflexion - right;Decreased weight shift to right;Poor foot clearance - right    Ambulation Surface Level;Indoor    Gait velocity 27.44 secs w/ RW = 1.19 ft/sec      Standardized Balance Assessment   Standardized Balance Assessment Timed Up and Go Test;Berg Balance Test      Berg Balance Test   Sit to Stand Able to stand  independently using hands    Standing Unsupported Able to stand 2 minutes with supervision    Sitting with Back Unsupported but Feet Supported on Floor or Stool Able to sit safely and securely 2 minutes    Stand to Sit Controls descent by using hands    Transfers Able to transfer safely, definite need of hands    Standing Unsupported with Eyes Closed Able to stand 10 seconds with supervision    Standing Unsupported with Feet Together Needs help to attain position but able to stand for 30 seconds with feet together    From Standing, Reach Forward with Outstretched Arm Can reach forward >12 cm safely (5")    From Standing Position, Pick up Object from Floor Able to pick up shoe, needs supervision    From Standing Position, Turn to Look Behind Over each Shoulder Turn sideways only but maintains balance    Turn 360 Degrees  Able to turn 360 degrees safely but slowly    Standing Unsupported, Alternately Place Feet on Step/Stool Able to complete >2 steps/needs minimal assist    Standing Unsupported, One Foot in Front Needs help to step but can hold 15 seconds    Standing on One Leg Able to lift leg independently and hold equal to or more than 3 seconds    Total Score 34    Berg comment: 34/56      Timed Up and Go Test   TUG Normal TUG    Normal TUG (seconds) 23.56      RLE Tone   RLE Tone Within Functional Limits                      Objective measurements completed on examination: See above findings.               PT Education - 09/16/20 1409    Education Details Educated on [employer]; Continued use of RW due to fall risk    Person(s) Educated Patient;Spouse    Methods Explanation    Comprehension Verbalized understanding            PT Short Term Goals - 09/16/20 1740      PT SHORT TERM GOAL #1   Title Patient will be independent with initial HEP for balance/strength (All STGs Due: 10/14/20)    Baseline no  HEP established    Time 4    Period Weeks    Status New  Target Date 10/14/20      PT SHORT TERM GOAL #2   Title Patient will improve gait speed to >/= 1.5 ft/sec with LRAD    Baseline 1.19 ft/sec w/ RW    Time 4    Period Weeks    Status New      PT SHORT TERM GOAL #3   Title Patient will improve Berg Balance to >/= 39/56 to indicate improved balance and reduced fall risk.    Baseline 34/56    Time 4    Period Weeks    Status New      PT SHORT TERM GOAL #4   Title Patient will improve TUG to </= 20 secs w/ LRAD to indicate reduced fall risk.    Baseline 23.56 secs    Time 4    Period Weeks    Status New      PT SHORT TERM GOAL #5   Title Patient will ambulate >/= 500 ft w/ LRAD and supervision for improved household mobility    Baseline 100 ft CGA    Time 4    Period Weeks    Status New             PT Long Term Goals -  09/16/20 1743      PT LONG TERM GOAL #1   Title Patient will be independent with final HEP for balance/strength (All LTGs Due: 11/11/20)    Baseline no HEP established    Time 8    Period Weeks    Status New    Target Date 11/11/20      PT LONG TERM GOAL #2   Title Patient will improve gait speed to >/= 2.0 ft/sec with LRAD    Baseline 1.19 ft/sec    Time 8    Period Weeks    Status New      PT LONG TERM GOAL #3   Title Patient will improve Berg Balance to >/= 45/56 to indicate improved balance and reduced fall risk    Baseline 34/56    Time 8    Period Weeks    Status New      PT LONG TERM GOAL #4   Title Patient will improve TUG to </= 15 secs w/ LRAD to indicate reduced fall risk    Baseline 23.56 secs    Time 8    Period Weeks    Status New      PT LONG TERM GOAL #5   Title Patient will improve 5xSTS to </= 12 secs to indicate reduced fall risk    Baseline 14.42 secs    Time 8    Period Weeks    Status New      Additional Long Term Goals   Additional Long Term Goals Yes      PT LONG TERM GOAL #6   Title Patient will demo ability to ascend/descend 12 stairs with single rail and supervision for improved ability to ambulate to second floor of home    Baseline not currently completing    Time 8    Period Weeks    Status New      PT LONG TERM GOAL #7   Title Patient will improve FOTO to >/= 65%    Baseline 49%    Time 8    Period Weeks    Status New                  Plan - 09/16/20 1514    Clinical  Impression Statement Patient is a 75 y.o. 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 Patient's PMH significant for the following: Migraines, HLD, DM, GERD, Lactose Intolerance, Kidney Stones, Diverticulitis, Anemia, Incontinence. Upon evaluation patient presents with the following impairments: impaired coordination, decreased strength, decreased ROM, impaired balance, abnormal gait, decreased endurance/activity tolerance, and  increased fall risk. Patient currently ambulating with RW at 1.19 ft/sec demonstrating household walker. Paitent demosntrating high fall risk with Berg Balance score of 34/56 and TUG time of 23.56 secs. Patient will benefit from skilled PT services to address impairments and reduce fall risk.    Personal Factors and Comorbidities Comorbidity 3+    Comorbidities Migraines, HLD, DM, GERD, Lactose Intolerance, Kidney Stones, Diverticulitis, Anemia, Incontinence    Examination-Activity Limitations Bed Mobility;Reach Overhead;Sit;Locomotion Level;Stand;Transfers;Stairs    Examination-Participation Restrictions Community Activity;Driving    Stability/Clinical Decision Making Stable/Uncomplicated    Clinical Decision Making Low    Rehab Potential Good    PT Frequency 2x / week    PT Duration 8 weeks    PT Treatment/Interventions ADLs/Self Care Home Management;Electrical Stimulation;Moist Heat;Cryotherapy;DME Instruction;Gait training;Stair training;Functional mobility training;Therapeutic activities;Balance training;Therapeutic exercise;Neuromuscular re-education;Patient/family education;Orthotic Fit/Training;Passive range of motion;Vestibular    PT Next Visit Plan Initiate HEP focused on Strength. Assess gait with AFO. Assess Stairs    Recommended Other Services Occupational Therapy    Consulted and Agree with Plan of Care Patient;Family member/caregiver    Family Member Consulted Husband           Patient will benefit from skilled therapeutic intervention in order to improve the following deficits and impairments:  Abnormal gait,Decreased activity tolerance,Decreased endurance,Decreased knowledge of use of DME,Decreased range of motion,Decreased strength,Impaired UE functional use,Difficulty walking,Decreased balance,Decreased safety awareness,Decreased coordination  Visit Diagnosis: Unsteadiness on feet  Muscle weakness (generalized)  Difficulty in walking, not elsewhere classified  Other  abnormalities of gait and mobility     Problem List Patient Active Problem List   Diagnosis Date Noted  . CVA (cerebral vascular accident) ([place]) 08/30/2020  . Right sided weakness 08/29/2020  . Stroke ([place]) 08/29/2020  . Hyperlipidemia   . Diabetes mellitus without complication ([place])   . Pain in lateral portion of right ankle 07/20/2020  . Plantar fasciitis of right foot 05/19/2020  . Small vessel disease, cerebrovascular   . Pes anserine bursitis 04/09/2019  . Skin rash 04/09/2019  . Red skin 02/25/2019  . Overactive bladder 10/24/2018  . Amnesia memory loss 05/19/2017  . Obesity, Class I, BMI 30-34.9 05/09/2017  . Lumbar herniated disc 02/01/2017  . Left shoulder pain 09/30/2016  . Medicare annual wellness visit, subsequent 05/03/2016  . 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 History of kidney stones   . Cervical pain (neck) 12/04/2015  . Health maintenance examination 04/29/2014  . Paresthesia 04/29/2014  . Advanced care planning/counseling discussion 04/29/2014  . PFD (pelvic floor dysfunction) 04/29/2014  . Controlled type 2 diabetes mellitus without complication, without long-term current use of insulin ([place]) 12/24/2013  . 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  . Hx of diverticulitis of colon 11/07/2012  . Allergic rhinitis 11/07/2012  . Hyperlipidemia associated with type 2 diabetes mellitus ([place]) 11/07/2012  . Migraine 11/07/2012  . Hereditary and idiopathic peripheral neuropathy 11/07/2012  . LGSIL Pap smear of vaginal cuff 11/07/2012  . GERD (gastroesophageal reflux disease) 11/07/2012  . Family history of premature coronary heart disease 11/07/2012    Jones Bales, PT, DPT 09/16/2020, 5:47 PM  [facility] [address]  [place], [place], [address] Phone: [phone]   Fax:  [phone]  Name: Dana [name] MRN: [id number] Date of Birth: [birth year]

## 2020-09-18 ENCOUNTER — Telehealth: Payer: Self-pay | Admitting: Family Medicine

## 2020-09-18 DIAGNOSIS — R4701 Aphasia: Secondary | ICD-10-CM

## 2020-09-18 DIAGNOSIS — I69328 Other speech and language deficits following cerebral infarction: Secondary | ICD-10-CM

## 2020-09-18 DIAGNOSIS — I69359 Hemiplegia and hemiparesis following cerebral infarction affecting unspecified side: Secondary | ICD-10-CM

## 2020-09-18 DIAGNOSIS — I6339 Cerebral infarction due to thrombosis of other cerebral artery: Secondary | ICD-10-CM

## 2020-09-18 NOTE — Telephone Encounter (Addendum)
Spoke with pt.  States she came home on 09/11/20.  Says she goes to [facility] for PT and ST but needs order for OT at [facility] also.  Pt has hosp f/u already scheduled on 09/21/20 at 8:00.

## 2020-09-18 NOTE — Telephone Encounter (Signed)
Patient was referred to Speech with [facility] following her ED visit. Patient was discharged to SNF. Services started on 09/16/20 for SLP.   Dr Darnell Level is aware. The referral placed by our office is being cancelled at this time as it is not needed.   Nothing further needed.

## 2020-09-18 NOTE — Telephone Encounter (Signed)
Will review at OV

## 2020-09-18 NOTE — Telephone Encounter (Signed)
Received request from [facility] to refer pt to outpatient ST for expressive aphasia - referral placed.  plz call husband - recently admitted with stroke, discharged to SNF on 09/01/2020. Is she still at SNF or is she now at home? If home, when did she come home and please schedule f/u visit. If still at SNF, have them call us when she's discharged for TCM phone call and to schedule TCM visit.

## 2020-09-19 ENCOUNTER — Other Ambulatory Visit: Payer: Self-pay | Admitting: Family Medicine

## 2020-09-19 DIAGNOSIS — I69328 Other speech and language deficits following cerebral infarction: Secondary | ICD-10-CM

## 2020-09-19 DIAGNOSIS — E118 Type 2 diabetes mellitus with unspecified complications: Secondary | ICD-10-CM

## 2020-09-19 DIAGNOSIS — E1169 Type 2 diabetes mellitus with other specified complication: Secondary | ICD-10-CM

## 2020-09-19 DIAGNOSIS — I69359 Hemiplegia and hemiparesis following cerebral infarction affecting unspecified side: Secondary | ICD-10-CM

## 2020-09-21 ENCOUNTER — Encounter: Payer: Self-pay | Admitting: Family Medicine

## 2020-09-21 ENCOUNTER — Ambulatory Visit (INDEPENDENT_AMBULATORY_CARE_PROVIDER_SITE_OTHER): Payer: Medicare HMO | Admitting: Family Medicine

## 2020-09-21 ENCOUNTER — Other Ambulatory Visit: Payer: Self-pay

## 2020-09-21 ENCOUNTER — Other Ambulatory Visit: Payer: Medicare HMO

## 2020-09-21 VITALS — BP 128/78 | HR 68 | Temp 98.2°F | Ht 63.0 in | Wt 167.2 lb

## 2020-09-21 DIAGNOSIS — R208 Other disturbances of skin sensation: Secondary | ICD-10-CM | POA: Insufficient documentation

## 2020-09-21 DIAGNOSIS — E1169 Type 2 diabetes mellitus with other specified complication: Secondary | ICD-10-CM | POA: Diagnosis not present

## 2020-09-21 DIAGNOSIS — I69359 Hemiplegia and hemiparesis following cerebral infarction affecting unspecified side: Secondary | ICD-10-CM

## 2020-09-21 DIAGNOSIS — E118 Type 2 diabetes mellitus with unspecified complications: Secondary | ICD-10-CM | POA: Diagnosis not present

## 2020-09-21 DIAGNOSIS — E785 Hyperlipidemia, unspecified: Secondary | ICD-10-CM | POA: Diagnosis not present

## 2020-09-21 DIAGNOSIS — G43109 Migraine with aura, not intractable, without status migrainosus: Secondary | ICD-10-CM

## 2020-09-21 DIAGNOSIS — R531 Weakness: Secondary | ICD-10-CM | POA: Diagnosis not present

## 2020-09-21 LAB — MICROALBUMIN / CREATININE URINE RATIO
Creatinine,U: 82.4 mg/dL
Microalb Creat Ratio: 5.8 mg/g (ref 0.0–30.0)
Microalb, Ur: 4.8 mg/dL — ABNORMAL HIGH (ref 0.0–1.9)

## 2020-09-21 LAB — LDL CHOLESTEROL, DIRECT: Direct LDL: 84 mg/dL

## 2020-09-21 LAB — COMPREHENSIVE METABOLIC PANEL
ALT: 18 U/L (ref 0–35)
AST: 14 U/L (ref 0–37)
Albumin: 4.2 g/dL (ref 3.5–5.2)
Alkaline Phosphatase: 74 U/L (ref 39–117)
BUN: 20 mg/dL (ref 6–23)
CO2: 26 mEq/L (ref 19–32)
Calcium: 9.5 mg/dL (ref 8.4–10.5)
Chloride: 109 mEq/L (ref 96–112)
Creatinine, Ser: 0.72 mg/dL (ref 0.40–1.20)
GFR: 82.81 mL/min (ref >60.00)
Glucose, Bld: 120 mg/dL — ABNORMAL HIGH (ref 70–99)
Potassium: 4.2 mEq/L (ref 3.5–5.1)
Sodium: 143 mEq/L (ref 135–145)
Total Bilirubin: 0.5 mg/dL (ref 0.2–1.2)
Total Protein: 7.3 g/dL (ref 6.0–8.3)

## 2020-09-21 LAB — VITAMIN B12: Vitamin B-12: 692 pg/mL (ref 211–911)

## 2020-09-21 MED ORDER — GABAPENTIN 100 MG PO CAPS: 100.0000 mg | ORAL_CAPSULE | Freq: Every evening | ORAL | 1 refills | Status: DC | PRN

## 2020-09-21 NOTE — Assessment & Plan Note (Signed)
Chronic, diet controlled. 

## 2020-09-21 NOTE — Assessment & Plan Note (Addendum)
Hemiparesis due to recent stroke - R side dominant.  Reviewed hospitalization records with patient.  She has f/u neuro visit planned for next month.  Continue aspirin 81mg , atorvastatin 40mg  (consider increased dose for goal LDL <70).  Will refer to OT.  Word finding difficulties possibly due to topamax, not recent stroke.

## 2020-09-21 NOTE — Patient Instructions (Addendum)
Good to see you today Continue current medicines Return as needed or for physical.  May try gabapentin 100-200mg  at night time for nerve pain to right lateral thigh

## 2020-09-21 NOTE — Assessment & Plan Note (Signed)
H/o this. On topamax.

## 2020-09-21 NOTE — Progress Notes (Signed)
Patient ID: Dana [name], female    DOB: [birth year], 75 y.o.   MRN: [id number]  This visit was conducted in person.  BP 128/78   Pulse 68   Temp 98.2 F (36.8 C) (Temporal)   Ht 5\' 3"  (1.6 m)   Wt 167 lb 3 oz (75.8 kg)   SpO2 99%   BMI 29.62 kg/m    CC: hosp f/u visit  Subjective:   HPI: Dana [name] is a 75 y.o. female presenting on 09/21/2020 for Hospitalization Follow-up (Admitted on 08/29/20 to [facility], dx right sided weakness.  Pt accompanied by husband, Dana [name]- temp 97.5.)   R handed. Recent hospitalization for acute R sided weakness, found to have acute left thalamic capsular infarct by MRI - treated with DAPT (aspirin+ plavix) for 3 wks then aspirin alone. Work-up reviewed - normal CTA, normal 2D echo, normal telemetry. LDL was at 77 on statin. She was previously on aspirin 81mg  daily. She has completed plavix course.   Undergoing PT, ST at [facility] - states she needs needs referral for OT through them. Notes trouble getting words out or finding words.  Neuro f/u scheduled for 11/02/2020.   She though she was having a migraine - so took normal migraine headache cocktail (fioricet, ibuprofen, robaxin with daily topamax).   Since stroke, notes worsening R leg paresthesias and burning pain especially when compressed (ie sleeping on right side at night time). Describes lateral thigh paresthesia/dysesthesia.    Admit date: 08/29/2020 Discharge to SNF ([place]): 09/01/2020 Discharge home: 09/11/2020 TCM hosp f/u phone call not performed  Admitted From: Home Disposition:  SNF [place] comm[facility]commendations for Outpatient Follow-up:  1. Follow with neurology clinic in 4 to 6 weeks 2. continue with PT/OT/speech evaluation at facility  Discharge Diagnoses:  Principal Problem:   Right sided weakness Active Problems:   Migraine   GERD (gastroesophageal reflux disease)   Hyperlipidemia   Diabetes mellitus without complication (HCC)   Stroke  (HCC)   CVA (cerebral vascular accident) ([place])  Home Health:No Equipment/Devices:No  Discharge Condition:Stable CODE STATUS:FULL Diet recommendation: Heart Healthy / Carb Modified      Relevant past medical, surgical, family and social history reviewed and updated as indicated. Interim medical history since our last visit reviewed. Allergies and medications reviewed and updated. Outpatient Medications Prior to Visit  Medication Sig Dispense Refill  . acetaminophen (TYLENOL) 325 MG tablet Take 1 tablet (325 mg total) by mouth every 6 (six) hours as needed for mild pain.    Marland Kitchen aspirin 81 MG tablet Take 81 mg by mouth daily.    Marland Kitchen atorvastatin (LIPITOR) 40 MG tablet TAKE 1 TABLET (40 MG TOTAL) BY MOUTH DAILY. 90 tablet 0  . butalbital-acetaminophen-caffeine (FIORICET) 50-325-40 MG tablet Take 1 tablet by mouth every 6 (six) hours as needed for headache. 10 tablet 1  . co-enzyme Q-10 30 MG capsule Take 30 mg by mouth daily.     . fluticasone (FLONASE) 50 MCG/ACT nasal spray USE 2 SPRAYS IN EACH NOSTRIL EVERY DAY 48 g 0  . lactase (LACTAID) 3000 units tablet Take 2 tablets (6,000 Units total) by mouth 3 (three) times daily with meals.    . methocarbamol (ROBAXIN) 500 MG tablet TAKE 1 TABLET (500 MG TOTAL) BY MOUTH 3 (THREE) TIMES DAILY AS NEEDED FOR MUSCLE SPASMS (SEDATION PRECAUTIONS). 60 tablet 1  . Multiple Vitamin (MULTIVITAMIN) tablet Take 1 tablet by mouth daily.    Marland Kitchen omeprazole (PRILOSEC) 20 MG capsule Take 1  capsule (20 mg total) by mouth daily. 90 capsule 3  . ondansetron (ZOFRAN ODT) 4 MG disintegrating tablet Take 1 tablet (4 mg total) by mouth every 8 (eight) hours as needed. 20 tablet 3  . topiramate (TOPAMAX) 25 MG tablet Take 3 tablets (75 mg total) by mouth at bedtime. 270 tablet 4  . triamcinolone cream (KENALOG) 0.1 % APPLY TO AFFECTED AREA TWICE A DAY 80 g 1  . vitamin B-12 (CYANOCOBALAMIN) 1000 MCG tablet Take 1 tablet (1,000 mcg total) by mouth every Monday, Wednesday, and  Friday.    . senna-docusate (SENOKOT-S) 8.6-50 MG tablet Take 1 tablet by mouth at bedtime as needed for mild constipation.     No facility-administered medications prior to visit.     Per HPI unless specifically indicated in ROS section below Review of Systems Objective:  BP 128/78   Pulse 68   Temp 98.2 F (36.8 C) (Temporal)   Ht 5\' 3"  (1.6 m)   Wt 167 lb 3 oz (75.8 kg)   SpO2 99%   BMI 29.62 kg/m   Wt Readings from Last 3 Encounters:  09/21/20 167 lb 3 oz (75.8 kg)  08/29/20 170 lb (77.1 kg)  07/20/20 168 lb 8 oz (76.4 kg)      Physical Exam Vitals and nursing note reviewed.  Constitutional:      Appearance: Normal appearance. She is not ill-appearing.  HENT:     Head: Normocephalic and atraumatic.  Eyes:     Extraocular Movements: Extraocular movements intact.     Conjunctiva/sclera: Conjunctivae normal.     Pupils: Pupils are equal, round, and reactive to light.  Neck:     Vascular: No carotid bruit.  Cardiovascular:     Rate and Rhythm: Normal rate and regular rhythm.     Pulses: Normal pulses.     Heart sounds: Normal heart sounds. No murmur heard.   Pulmonary:     Effort: Pulmonary effort is normal. No respiratory distress.     Breath sounds: Normal breath sounds. No wheezing, rhonchi or rales.  Musculoskeletal:     Cervical back: Normal range of motion. No rigidity.     Right lower leg: No edema.     Left lower leg: No edema.  Lymphadenopathy:     Cervical: No cervical adenopathy.  Skin:    General: Skin is warm and dry.     Findings: No rash.  Neurological:     General: No focal deficit present.     Mental Status: She is alert.     Sensory: Sensation is intact.     Motor: Motor function is intact.     Comments:  CN grossly intact 5/5 strength BUE, BLE Grip strength intact Ambulates with walker due to unsteady gait  Psychiatric:        Mood and Affect: Mood normal.        Behavior: Behavior normal.       Lab Results  Component Value  Date   HGBA1C 6.5 (H) 08/30/2020    Lab Results  Component Value Date   CHOL 149 08/30/2020   HDL 52 08/30/2020   LDLCALC 77 08/30/2020   TRIG 101 08/30/2020   CHOLHDL 2.9 08/30/2020    Lab Results  Component Value Date   CREATININE 0.72 09/01/2020   BUN 21 09/01/2020   NA 141 09/01/2020   K 3.7 09/01/2020   CL 109 09/01/2020   CO2 24 09/01/2020   Assessment & Plan:  This visit occurred during the SARS-CoV-2  public health emergency.  Safety protocols were in place, including screening questions prior to the visit, additional usage of staff PPE, and extensive cleaning of exam room while observing appropriate contact time as indicated for disinfecting solutions.   Problem List Items Addressed This Visit    Hyperlipidemia associated with type 2 diabetes mellitus ([place])    Update dLDL on atorvastatin 40mg .  The ASCVD Risk score [name], et al., 2013) failed to calculate for the following reasons:   The patient has a prior MI or stroke diagnosis       Relevant Orders   LDL Cholesterol, Direct   Migraine    H/o this. On topamax.       Relevant Medications   gabapentin (NEURONTIN) 100 MG capsule   Controlled diabetes mellitus type 2 with complications (HCC)    Chronic, diet controlled.       Right sided weakness   Relevant Orders   Ambulatory referral to Occupational Therapy   Vitamin B12   Hemiparesis due to recent stroke [facility]) - Primary    Hemiparesis due to recent stroke - R side dominant.  Reviewed hospitalization records with patient.  She has f/u neuro visit planned for next month.  Continue aspirin 81mg , atorvastatin 40mg  (consider increased dose for goal LDL <70).  Will refer to OT.  Word finding difficulties possibly due to topamax, not recent stroke.       Dysesthesia    Focal R lateral thigh area - anticipate meralgia paresthetica - trial gabapentin 100-200mg  nightly, update with effect.           Meds ordered this encounter  Medications  .  gabapentin (NEURONTIN) 100 MG capsule    Sig: Take 1-2 capsules (100-200 mg total) by mouth at bedtime as needed.    Dispense:  60 capsule    Refill:  1   Orders Placed This Encounter  Procedures  . Vitamin B12  . LDL Cholesterol, Direct  . Ambulatory referral to Occupational Therapy    Referral Priority:   Routine    Referral Type:   Occupational Therapy    Referral Reason:   Specialty Services Required    Requested Specialty:   Occupational Therapy    Number of Visits Requested:   1    Follow up plan: No follow-ups on file.  [name], MD

## 2020-09-21 NOTE — Assessment & Plan Note (Signed)
Update dLDL on atorvastatin 40mg .  The ASCVD Risk score Dana Bussing DC Jr., et al., 2013) failed to calculate for the following reasons:   The patient has a prior MI or stroke diagnosis

## 2020-09-21 NOTE — Assessment & Plan Note (Signed)
Focal R lateral thigh area - anticipate meralgia paresthetica - trial gabapentin 100-200mg  nightly, update with effect.

## 2020-09-22 ENCOUNTER — Ambulatory Visit: Payer: Medicare HMO

## 2020-09-22 DIAGNOSIS — H2512 Age-related nuclear cataract, left eye: Secondary | ICD-10-CM | POA: Diagnosis not present

## 2020-09-22 DIAGNOSIS — H25013 Cortical age-related cataract, bilateral: Secondary | ICD-10-CM | POA: Diagnosis not present

## 2020-09-22 DIAGNOSIS — H2513 Age-related nuclear cataract, bilateral: Secondary | ICD-10-CM | POA: Diagnosis not present

## 2020-09-22 DIAGNOSIS — H18413 Arcus senilis, bilateral: Secondary | ICD-10-CM | POA: Diagnosis not present

## 2020-09-22 DIAGNOSIS — H25043 Posterior subcapsular polar age-related cataract, bilateral: Secondary | ICD-10-CM | POA: Diagnosis not present

## 2020-09-23 ENCOUNTER — Ambulatory Visit: Payer: Medicare HMO | Admitting: Speech Pathology

## 2020-09-23 ENCOUNTER — Ambulatory Visit: Payer: Medicare HMO

## 2020-09-23 ENCOUNTER — Other Ambulatory Visit: Payer: Self-pay

## 2020-09-23 ENCOUNTER — Other Ambulatory Visit: Payer: Self-pay | Admitting: Family Medicine

## 2020-09-23 DIAGNOSIS — R2689 Other abnormalities of gait and mobility: Secondary | ICD-10-CM | POA: Diagnosis not present

## 2020-09-23 DIAGNOSIS — R262 Difficulty in walking, not elsewhere classified: Secondary | ICD-10-CM

## 2020-09-23 DIAGNOSIS — R278 Other lack of coordination: Secondary | ICD-10-CM | POA: Diagnosis not present

## 2020-09-23 DIAGNOSIS — R2681 Unsteadiness on feet: Secondary | ICD-10-CM

## 2020-09-23 DIAGNOSIS — M6281 Muscle weakness (generalized): Secondary | ICD-10-CM | POA: Diagnosis not present

## 2020-09-23 DIAGNOSIS — R4701 Aphasia: Secondary | ICD-10-CM | POA: Diagnosis not present

## 2020-09-23 DIAGNOSIS — I69851 Hemiplegia and hemiparesis following other cerebrovascular disease affecting right dominant side: Secondary | ICD-10-CM | POA: Diagnosis not present

## 2020-09-23 NOTE — Therapy (Signed)
[facility] [address] [place], [place], [address] Phone: [phone]   Fax:  [phone]  Physical Therapy Treatment  Patient Details  Name: Dana [name] MRN: [id number] Date of Birth: 04-19-1946 Referring Provider (PT): Referred by: [name], NP. PCP is [name], MD   Encounter Date: 09/23/2020   PT End of Session - 09/23/20 1321    Visit Number 2    Number of Visits 17    Date for PT Re-Evaluation 12/15/20   POC for 8 weeks, Cert for 90 days   Authorization Type [employer] (10th Visit PN)    Authorization Time Period awaiting authorization    Progress Note Due on Visit 10    PT Start Time 1317    PT Stop Time 1359    PT Time Calculation (min) 42 min    Equipment Utilized During Treatment Gait belt    Activity Tolerance Patient tolerated treatment well    Behavior During Therapy WFL for tasks assessed/performed           Past Medical History:  Diagnosis Date  . Allergy   . Anemia   . Arthritis   . Cataract   . Controlled type 2 diabetes mellitus without complication, without long-term current use of insulin ([place])   . Diverticulitis   . History of chicken pox   . Hyperlipidemia   . Kidney stone   . 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 Migraine   . Urge incontinence 04/29/2014    Past Surgical History:  Procedure Laterality Date  . ABDOMINAL HYSTERECTOMY  2006   cervix remains, menorrhagia  . APPENDECTOMY  1970  . [place]   biopsy-left  . BREAST SURGERY  1995   right milk duct removed  . CARPAL TUNNEL RELEASE Right 02/2017  . CHOLECYSTECTOMY  1971  . COLONOSCOPY  11/2005   diverticulosis o/w WNL [facility])  . DEXA  2015   spine -0.4, hip -1.0 WNL  . LITHOTRIPSY    . LUMBAR EPIDURAL INJECTION Left 10/2017   transforaminal L L3/4 (Dr Maryjean Ka)  . TUBAL LIGATION      There were no vitals filed for this visit.   Subjective Assessment - 09/23/20 1318     Subjective Patient saw Dr. Darnell Level on Monday, was prescribed Gabapentin to help with the sensations down the R Leg. Reports it is feeling good today. No pain or fall to report. Patient was scheduled for Cataract surgery, but plans to postpone it to focus on rehab.    Patient is accompained by: Family member   [name])   Pertinent History Migraines, HLD, DM, GERD, Lactose Intolerance, Kidney Stones, Diverticulitis, Anemia, Incontinence    Limitations Sitting;Standing;Walking    How long can you walk comfortably? 10 minutes    Patient Stated Goals Walk without Assistance;    Currently in Pain? No/denies               [facility] Adult PT Treatment/Exercise - 09/23/20 0001      Transfers   Transfers Sit to Stand;Stand to Sit    Sit to Stand 5: Supervision;4: Min guard    Stand to Sit 5: Supervision;4: Min guard      Ambulation/Gait   Ambulation/Gait Yes    Ambulation/Gait Assistance 5: Supervision;4: Min guard    Ambulation/Gait Assistance Details ambulation around therapy gym with RW, PT provided tennis balls for RW for improved propulsion. Paitent did well negotiating through crowded therapy gym.  Ambulation Distance (Feet) 115 Feet    Assistive device Rolling walker    Gait Pattern Step-through pattern;Decreased arm swing - right;Decreased arm swing - left;Decreased step length - right;Decreased step length - left;Decreased stance time - right;Decreased hip/knee flexion - right;Decreased dorsiflexion - right;Decreased weight shift to right;Poor foot clearance - right    Ambulation Surface Level;Indoor    Stairs Yes    Stairs Assistance 5: Supervision    Stairs Assistance Details (indicate cue type and reason) completed ascend/descend stairs with B rails, PT providing cues for proper sequencing. "Up with the good, down with the bad". Able to demo proper completion with cues    Stair Management Technique Two rails;Step to pattern;Forwards    Number of Stairs 8    Height of Stairs 6       Exercises   Exercises Other Exercises    Other Exercises  Initiated HEP focused on strengthening (see below for details):           Completed all of the following exercises during session today, as established HEP:   Access Code: [email or web address] URL: [email or web address] Date: 09/23/2020 Prepared by: Baldomero Lamy  Exercises Supine Bridge - 1 x daily - 5 x weekly - 2 sets - 10 reps Clamshell - 1 x daily - 5 x weekly - 2 sets - 10 reps Supine March with Resistance Band - 1 x daily - 5 x weekly - 2 sets - 10 reps - with yellow theraband Sit to Stand with Arms Crossed - 1 x daily - 5 x weekly - 2 sets - 10 reps      PT Education - 09/23/20 1322    Education Details Initial HEP    Person(s) Educated Patient    Methods Explanation;Demonstration;Handout    Comprehension Verbalized understanding;Returned demonstration            PT Short Term Goals - 09/16/20 1740      PT SHORT TERM GOAL #1   Title Patient will be independent with initial HEP for balance/strength (All STGs Due: 10/14/20)    Baseline no  HEP established    Time 4    Period Weeks    Status New    Target Date 10/14/20      PT SHORT TERM GOAL #2   Title Patient will improve gait speed to >/= 1.5 ft/sec with LRAD    Baseline 1.19 ft/sec w/ RW    Time 4    Period Weeks    Status New      PT SHORT TERM GOAL #3   Title Patient will improve Berg Balance to >/= 39/56 to indicate improved balance and reduced fall risk.    Baseline 34/56    Time 4    Period Weeks    Status New      PT SHORT TERM GOAL #4   Title Patient will improve TUG to </= 20 secs w/ LRAD to indicate reduced fall risk.    Baseline 23.56 secs    Time 4    Period Weeks    Status New      PT SHORT TERM GOAL #5   Title Patient will ambulate >/= 500 ft w/ LRAD and supervision for improved household mobility    Baseline 100 ft CGA    Time 4    Period Weeks    Status New             PT Long Term Goals - 09/16/20 1743       PT  LONG TERM GOAL #1   Title Patient will be independent with final HEP for balance/strength (All LTGs Due: 11/11/20)    Baseline no HEP established    Time 8    Period Weeks    Status New    Target Date 11/11/20      PT LONG TERM GOAL #2   Title Patient will improve gait speed to >/= 2.0 ft/sec with LRAD    Baseline 1.19 ft/sec    Time 8    Period Weeks    Status New      PT LONG TERM GOAL #3   Title Patient will improve Berg Balance to >/= 45/56 to indicate improved balance and reduced fall risk    Baseline 34/56    Time 8    Period Weeks    Status New      PT LONG TERM GOAL #4   Title Patient will improve TUG to </= 15 secs w/ LRAD to indicate reduced fall risk    Baseline 23.56 secs    Time 8    Period Weeks    Status New      PT LONG TERM GOAL #5   Title Patient will improve 5xSTS to </= 12 secs to indicate reduced fall risk    Baseline 14.42 secs    Time 8    Period Weeks    Status New      Additional Long Term Goals   Additional Long Term Goals Yes      PT LONG TERM GOAL #6   Title Patient will demo ability to ascend/descend 12 stairs with single rail and supervision for improved ability to ambulate to second floor of home    Baseline not currently completing    Time 8    Period Weeks    Status New      PT LONG TERM GOAL #7   Title Patient will improve FOTO to >/= 65%    Baseline 49%    Time 8    Period Weeks    Status New                 Plan - 09/23/20 1338    Clinical Impression Statement Continued gait and stair training today, patient requiring verbal cues for improved sequencing with stairs. As patient was wanting to ascend with weak leg initailly. PT providing tennis for balls for improved propulsion with gait. Rest of session spent on initaiting supine/seated strengthening HEP. Patient tolerating well. Will continue to progress toward all LTGs.    Personal Factors and Comorbidities Comorbidity 3+    Comorbidities Migraines, HLD, DM,  GERD, Lactose Intolerance, Kidney Stones, Diverticulitis, Anemia, Incontinence    Examination-Activity Limitations Bed Mobility;Reach Overhead;Sit;Locomotion Level;Stand;Transfers;Stairs    Examination-Participation Restrictions Community Activity;Driving    Stability/Clinical Decision Making Stable/Uncomplicated    Rehab Potential Good    PT Frequency 2x / week    PT Duration 8 weeks    PT Treatment/Interventions ADLs/Self Care Home Management;Electrical Stimulation;Moist Heat;Cryotherapy;DME Instruction;Gait training;Stair training;Functional mobility training;Therapeutic activities;Balance training;Therapeutic exercise;Neuromuscular re-education;Patient/family education;Orthotic Fit/Training;Passive range of motion;Vestibular    PT Next Visit Plan Potential gait assesment with AFO vs. Foot Up? how was HEP. Add balance to HEP    Consulted and Agree with Plan of Care Patient;Family member/caregiver    Family Member Consulted Husband           Patient will benefit from skilled therapeutic intervention in order to improve the following deficits and impairments:  Abnormal gait,Decreased activity tolerance,Decreased endurance,Decreased knowledge  of use of DME,Decreased range of motion,Decreased strength,Impaired UE functional use,Difficulty walking,Decreased balance,Decreased safety awareness,Decreased coordination  Visit Diagnosis: Unsteadiness on feet  Muscle weakness (generalized)  Difficulty in walking, not elsewhere classified  Other abnormalities of gait and mobility     Problem List Patient Active Problem List   Diagnosis Date Noted  . Dysesthesia 09/21/2020  . Hemiparesis due to recent stroke ([place]) 08/30/2020  . Right sided weakness 08/29/2020  . Pain in lateral portion of right ankle 07/20/2020  . Plantar fasciitis of right foot 05/19/2020  . Small vessel disease, cerebrovascular   . Pes anserine bursitis 04/09/2019  . Skin rash 04/09/2019  . Red skin 02/25/2019  .  Overactive bladder 10/24/2018  . Amnesia memory loss 05/19/2017  . Overweight (BMI 25.0-29.9) 05/09/2017  . Lumbar herniated disc 02/01/2017  . Left shoulder pain 09/30/2016  . Medicare annual wellness visit, subsequent 05/03/2016  . 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 History of kidney stones   . Cervical pain (neck) 12/04/2015  . Health maintenance examination 04/29/2014  . Paresthesia 04/29/2014  . Advanced care planning/counseling discussion 04/29/2014  . PFD (pelvic floor dysfunction) 04/29/2014  . Controlled diabetes mellitus type 2 with complications ([place]) 57/26/2035  . 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  . Hx of diverticulitis of colon 11/07/2012  . Allergic rhinitis 11/07/2012  . Hyperlipidemia associated with type 2 diabetes mellitus ([place]) 11/07/2012  . Migraine 11/07/2012  . Hereditary and idiopathic peripheral neuropathy 11/07/2012  . LGSIL Pap smear of vaginal cuff 11/07/2012  . GERD (gastroesophageal reflux disease) 11/07/2012  . Family history of premature coronary heart disease 11/07/2012    Jones Bales, PT, DPT 09/23/2020, 2:47 PM  [facility] [address] [place], [place], [address] Phone: [phone]   Fax:  [phone]  Name: Dana [name] MRN: [id number] Date of Birth: 03-08-1946

## 2020-09-23 NOTE — Patient Instructions (Signed)
Access Code: [email or web address] URL: [email or web address] Date: 09/23/2020 Prepared by: Baldomero Lamy  Exercises Supine Bridge - 1 x daily - 5 x weekly - 2 sets - 10 reps Clamshell - 1 x daily - 5 x weekly - 2 sets - 10 reps Supine March with Resistance Band - 1 x daily - 5 x weekly - 2 sets - 10 reps Sit to Stand with Arms Crossed - 1 x daily - 5 x weekly - 2 sets - 10 reps

## 2020-09-23 NOTE — Therapy (Signed)
[facility] [address] [place], [place], [address] Phone: [phone]   Fax:  [phone]  Patient Details  Name: Dana [name] MRN: [id number] Date of Birth: 05/06/1946 Referring Provider:  [name], MD  Encounter Date: 09/23/2020   Pt returns today after initial evaluation. Her physician told her the word finding difficulties were from topiramate. Today, she endorses word finding difficulty prior to CVA and states this will continue as long as she is on this medication. [place] elects to defer ST at this time. I am in agreement.   SLP Long Term Goals - 09/23/20 1424      SLP LONG TERM GOAL #1   Title Pt will complete complex high level naming tasks with 90% accuracy and rare min A    Time 8    Period Weeks    Status Not Met      SLP LONG TERM GOAL #2   Title Pt will use compensations for aphasia over 20 minute complex conversation as needed with rare min A    Time 8    Period Weeks    Status Not Met      SLP LONG TERM GOAL #3   Title Pt will increase her score on the Communication Participation Item Bank - Short Form by 2 points (initial score 12)    Time 8    Period Weeks    Status Not Met      SLP LONG TERM GOAL #4   Title Pt will write 3 sentennce passage/paragraph/text/email, self correcting errors with rare min A    Status Not Met              Inez Rosato, Annye Rusk MS CCC-SLP 09/23/2020, 2:25 PM  [facility] [address] [place], [place], [address] Phone: [phone]   Fax:  [phone]

## 2020-09-29 ENCOUNTER — Telehealth: Payer: Self-pay

## 2020-09-29 DIAGNOSIS — E118 Type 2 diabetes mellitus with unspecified complications: Secondary | ICD-10-CM

## 2020-09-29 MED ORDER — TRUEPLUS LANCETS 33G MISC: 3 refills | Status: DC

## 2020-09-29 MED ORDER — TRUE METRIX LEVEL 1 LOW VI SOLN: 0 refills | Status: DC

## 2020-09-29 MED ORDER — TRUE METRIX METER W/DEVICE KIT: PACK | 0 refills | Status: DC

## 2020-09-29 MED ORDER — BD SWAB SINGLE USE REGULAR PADS: MEDICATED_PAD | 0 refills | Status: DC

## 2020-09-29 MED ORDER — TRUE METRIX BLOOD GLUCOSE TEST VI STRP: ORAL_STRIP | 3 refills | Status: DC

## 2020-09-29 NOTE — Telephone Encounter (Signed)
Received faxed rx request from [facility] mail order for True Metrix meter and supplies.  E-scribed rxs.

## 2020-09-30 ENCOUNTER — Other Ambulatory Visit: Payer: Self-pay

## 2020-09-30 ENCOUNTER — Ambulatory Visit: Payer: Medicare HMO

## 2020-09-30 ENCOUNTER — Encounter: Payer: Medicare HMO | Admitting: Speech Pathology

## 2020-09-30 DIAGNOSIS — R4701 Aphasia: Secondary | ICD-10-CM | POA: Diagnosis not present

## 2020-09-30 DIAGNOSIS — I69851 Hemiplegia and hemiparesis following other cerebrovascular disease affecting right dominant side: Secondary | ICD-10-CM | POA: Diagnosis not present

## 2020-09-30 DIAGNOSIS — R2689 Other abnormalities of gait and mobility: Secondary | ICD-10-CM | POA: Diagnosis not present

## 2020-09-30 DIAGNOSIS — R262 Difficulty in walking, not elsewhere classified: Secondary | ICD-10-CM | POA: Diagnosis not present

## 2020-09-30 DIAGNOSIS — R278 Other lack of coordination: Secondary | ICD-10-CM | POA: Diagnosis not present

## 2020-09-30 DIAGNOSIS — R2681 Unsteadiness on feet: Secondary | ICD-10-CM

## 2020-09-30 DIAGNOSIS — M6281 Muscle weakness (generalized): Secondary | ICD-10-CM

## 2020-09-30 NOTE — Patient Instructions (Signed)
Access Code: [email or web address] URL: [email or web address] Date: 09/30/2020 Prepared by: Baldomero Lamy  Exercises Supine Bridge - 1 x daily - 5 x weekly - 2 sets - 10 reps Clamshell - 1 x daily - 5 x weekly - 2 sets - 10 reps Supine March with Resistance Band - 1 x daily - 5 x weekly - 2 sets - 10 reps Sit to Stand with Arms Crossed - 1 x daily - 5 x weekly - 2 sets - 10 reps Romberg Stance with Eyes Closed - 1 x daily - 5 x weekly - 1 sets - 3 reps - 30 hold Side Stepping with Counter Support - 1 x daily - 5 x weekly - 1 sets - 4 reps

## 2020-09-30 NOTE — Therapy (Signed)
[facility] [address] [place], [place], [address] Phone: [phone]   Fax:  [phone]  Physical Therapy Treatment  Patient Details  Name: Dana [name] MRN: [id number] Date of Birth: Sep 23, 1945 Referring Provider (PT): Referred by: [name], NP. PCP is [name], MD   Encounter Date: 09/30/2020   PT End of Session - 09/30/20 1406    Visit Number 3    Number of Visits 17    Date for PT Re-Evaluation 12/15/20   POC for 8 weeks, Cert for 90 days   Authorization Type [employer] (10th Visit PN)    Authorization Time Period awaiting authorization    Progress Note Due on Visit 10    PT Start Time 1400    PT Stop Time 1443    PT Time Calculation (min) 43 min    Equipment Utilized During Treatment Gait belt    Activity Tolerance Patient tolerated treatment well    Behavior During Therapy [facility] for tasks assessed/performed           Past Medical History:  Diagnosis Date  . Allergy   . Anemia   . Arthritis   . Cataract   . Controlled type 2 diabetes mellitus without complication, without long-term current use of insulin ([place])   . Diverticulitis   . History of chicken pox   . Hyperlipidemia   . Kidney stone   . 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 Migraine   . Urge incontinence 04/29/2014    Past Surgical History:  Procedure Laterality Date  . ABDOMINAL HYSTERECTOMY  2006   cervix remains, menorrhagia  . APPENDECTOMY  1970  . [place]   biopsy-left  . BREAST SURGERY  1995   right milk duct removed  . CARPAL TUNNEL RELEASE Right 02/2017  . CHOLECYSTECTOMY  1971  . COLONOSCOPY  11/2005   diverticulosis o/w WNL [facility])  . DEXA  2015   spine -0.4, hip -1.0 WNL  . LITHOTRIPSY    . LUMBAR EPIDURAL INJECTION Left 10/2017   transforaminal L L3/4 (Dr Maryjean Ka)  . TUBAL LIGATION      There were no vitals filed for this visit.   Subjective Assessment - 09/30/20 1403     Subjective Patient wore her tennis shoes today. Reports they are heavier. Is using rollator today. No falls or changes to report. No pain to report. Exercises are going well, did have some soreness but it went away quickly.    Patient is accompained by: Family member   [name])   Pertinent History Migraines, HLD, DM, GERD, Lactose Intolerance, Kidney Stones, Diverticulitis, Anemia, Incontinence    Limitations Sitting;Standing;Walking    How long can you walk comfortably? 10 minutes    Patient Stated Goals Walk without Assistance;    Currently in Pain? No/denies             [facility] Adult PT Treatment/Exercise - 09/30/20 0001      Transfers   Transfers Sit to Stand;Stand to Sit    Sit to Stand 5: Supervision;4: Min guard    Stand to Sit 5: Supervision;4: Min guard    Comments cues to [employer] on rollator prior to descent      Ambulation/Gait   Ambulation/Gait Yes    Ambulation/Gait Assistance 5: Supervision    Ambulation/Gait Assistance Details Patient ambulating with rollator today during session, PT adjusting to patient's height. Completed ambulation x 200 ft without AFO donned, working on improved stnading up  tall and step length on L to promote weight shift/stance on RLE. Trialed braces today during session, starting with R foot up brace x 200 ft, no significant changes noted in ambulation. Progressed to trialing R Posterior Ottobok AFO x 200 ft. Improved toe clearance and knee control noted. Paitent did have mild pain/discomfort with brace donned. Patient shoe slightly small and unable to remove insert therefore uncomfortable. PT educating to bring another pair of shoes with removable insert if possible to continue to trial braces.    Ambulation Distance (Feet) 200 Feet   x 2   Assistive device Rollator    Gait Pattern Step-through pattern;Decreased arm swing - right;Decreased arm swing - left;Decreased step length - right;Decreased step length - left;Decreased stance time -  right;Decreased hip/knee flexion - right;Decreased dorsiflexion - right;Decreased weight shift to right;Poor foot clearance - right    Ambulation Surface Level;Indoor             Balance Exercises - 09/30/20 0001      Balance Exercises: Standing   Standing Eyes Opened Narrow base of support (BOS);Head turns;Solid surface;Limitations    Standing Eyes Opened Limitations completed eyes open and narrow BOS, horizontal/vertical head turns x 10 reps. increased postural sway with horizontal > vertical.    Standing Eyes Closed Narrow base of support (BOS);Solid surface;3 reps;30 secs;Limitations    Standing Eyes Closed Limitations increased postural sway with vision removed, 3 x 30 seconds on firm surface.    Sidestepping 4 reps;Limitations    Sidestepping Limitations completed at countertop with intermittent UE support, cues for step length and control    Other Standing Exercises Comments PT educating on ways to complete balance exercises safely within the home, patient verbalized understanding. Updated handout provided.           Access Code: [email or web address] URL: [email or web address] Date: 09/30/2020 Prepared by: Baldomero Lamy  Exercises Supine Bridge - 1 x daily - 5 x weekly - 2 sets - 10 reps Clamshell - 1 x daily - 5 x weekly - 2 sets - 10 reps Supine March with Resistance Band - 1 x daily - 5 x weekly - 2 sets - 10 reps Sit to Stand with Arms Crossed - 1 x daily - 5 x weekly - 2 sets - 10 reps Romberg Stance with Eyes Closed - 1 x daily - 5 x weekly - 1 sets - 3 reps - 30 hold Side Stepping with Counter Support - 1 x daily - 5 x weekly - 1 sets - 4 reps      PT Education - 09/30/20 1443    Education Details Balance Additions to HEP    Person(s) Educated Patient    Methods Explanation;Demonstration;Handout    Comprehension Verbalized understanding;Returned demonstration            PT Short Term Goals - 09/16/20 1740      PT SHORT TERM GOAL #1   Title Patient  will be independent with initial HEP for balance/strength (All STGs Due: 10/14/20)    Baseline no  HEP established    Time 4    Period Weeks    Status New    Target Date 10/14/20      PT SHORT TERM GOAL #2   Title Patient will improve gait speed to >/= 1.5 ft/sec with LRAD    Baseline 1.19 ft/sec w/ RW    Time 4    Period Weeks    Status New      PT SHORT TERM GOAL #3  Title Patient will improve Berg Balance to >/= 39/56 to indicate improved balance and reduced fall risk.    Baseline 34/56    Time 4    Period Weeks    Status New      PT SHORT TERM GOAL #4   Title Patient will improve TUG to </= 20 secs w/ LRAD to indicate reduced fall risk.    Baseline 23.56 secs    Time 4    Period Weeks    Status New      PT SHORT TERM GOAL #5   Title Patient will ambulate >/= 500 ft w/ LRAD and supervision for improved household mobility    Baseline 100 ft CGA    Time 4    Period Weeks    Status New             PT Long Term Goals - 09/16/20 1743      PT LONG TERM GOAL #1   Title Patient will be independent with final HEP for balance/strength (All LTGs Due: 11/11/20)    Baseline no HEP established    Time 8    Period Weeks    Status New    Target Date 11/11/20      PT LONG TERM GOAL #2   Title Patient will improve gait speed to >/= 2.0 ft/sec with LRAD    Baseline 1.19 ft/sec    Time 8    Period Weeks    Status New      PT LONG TERM GOAL #3   Title Patient will improve Berg Balance to >/= 45/56 to indicate improved balance and reduced fall risk    Baseline 34/56    Time 8    Period Weeks    Status New      PT LONG TERM GOAL #4   Title Patient will improve TUG to </= 15 secs w/ LRAD to indicate reduced fall risk    Baseline 23.56 secs    Time 8    Period Weeks    Status New      PT LONG TERM GOAL #5   Title Patient will improve 5xSTS to </= 12 secs to indicate reduced fall risk    Baseline 14.42 secs    Time 8    Period Weeks    Status New      Additional  Long Term Goals   Additional Long Term Goals Yes      PT LONG TERM GOAL #6   Title Patient will demo ability to ascend/descend 12 stairs with single rail and supervision for improved ability to ambulate to second floor of home    Baseline not currently completing    Time 8    Period Weeks    Status New      PT LONG TERM GOAL #7   Title Patient will improve FOTO to >/= 65%    Baseline 49%    Time 8    Period Weeks    Status New                 Plan - 09/30/20 1702    Clinical Impression Statement Trialed various braces for RLE today, improved gait pattern noted with R Posterior Ottobok, however patient reporting mild discomfort. PT educating to bring shoes with removable insert for improved comfort. Rest of session spent working on standing balance, with patient tolerating well. Will continue to progresst toward all LTGs.    Personal Factors and Comorbidities Comorbidity 3+  Comorbidities Migraines, HLD, DM, GERD, Lactose Intolerance, Kidney Stones, Diverticulitis, Anemia, Incontinence    Examination-Activity Limitations Bed Mobility;Reach Overhead;Sit;Locomotion Level;Stand;Transfers;Stairs    Examination-Participation Restrictions Community Activity;Driving    Stability/Clinical Decision Making Stable/Uncomplicated    Rehab Potential Good    PT Frequency 2x / week    PT Duration 8 weeks    PT Treatment/Interventions ADLs/Self Care Home Management;Electrical Stimulation;Moist Heat;Cryotherapy;DME Instruction;Gait training;Stair training;Functional mobility training;Therapeutic activities;Balance training;Therapeutic exercise;Neuromuscular re-education;Patient/family education;Orthotic Fit/Training;Passive range of motion;Vestibular    PT Next Visit Plan Continue gait assesment with R AFO (used R Post Ottobok, have not tried [place] yet). Continue high level balance, weight shift activites. May benefit from [place].    Consulted and Agree with Plan of Care Patient;Family  member/caregiver    Family Member Consulted Husband           Patient will benefit from skilled therapeutic intervention in order to improve the following deficits and impairments:  Abnormal gait,Decreased activity tolerance,Decreased endurance,Decreased knowledge of use of DME,Decreased range of motion,Decreased strength,Impaired UE functional use,Difficulty walking,Decreased balance,Decreased safety awareness,Decreased coordination  Visit Diagnosis: Unsteadiness on feet  Muscle weakness (generalized)  Difficulty in walking, not elsewhere classified  Other abnormalities of gait and mobility     Problem List Patient Active Problem List   Diagnosis Date Noted  . Dysesthesia 09/21/2020  . Hemiparesis due to recent stroke ([place]) 08/30/2020  . Right sided weakness 08/29/2020  . Pain in lateral portion of right ankle 07/20/2020  . Plantar fasciitis of right foot 05/19/2020  . Small vessel disease, cerebrovascular   . Pes anserine bursitis 04/09/2019  . Skin rash 04/09/2019  . Red skin 02/25/2019  . Overactive bladder 10/24/2018  . Amnesia memory loss 05/19/2017  . Overweight (BMI 25.0-29.9) 05/09/2017  . Lumbar herniated disc 02/01/2017  . Left shoulder pain 09/30/2016  . Medicare annual wellness visit, subsequent 05/03/2016  . 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 History of kidney stones   . Cervical pain (neck) 12/04/2015  . Health maintenance examination 04/29/2014  . Paresthesia 04/29/2014  . Advanced care planning/counseling discussion 04/29/2014  . PFD (pelvic floor dysfunction) 04/29/2014  . Controlled diabetes mellitus type 2 with complications ([place]) 32/35/5732  . 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  . Hx of diverticulitis of colon 11/07/2012  . Allergic rhinitis 11/07/2012  . Hyperlipidemia associated with type 2 diabetes mellitus ([place]) 11/07/2012  . Migraine 11/07/2012  . Hereditary and idiopathic peripheral neuropathy 11/07/2012  . LGSIL Pap smear of vaginal cuff  11/07/2012  . GERD (gastroesophageal reflux disease) 11/07/2012  . Family history of premature coronary heart disease 11/07/2012    Jones Bales, PT, DPT 09/30/2020, 5:10 PM  [facility] [address] [place], [place], [address] Phone: [phone]   Fax:  [phone]  Name: SYBRINA LANING MRN: [id number] Date of Birth: [birth year]

## 2020-10-02 ENCOUNTER — Emergency Department (HOSPITAL_COMMUNITY): Payer: Medicare HMO

## 2020-10-02 ENCOUNTER — Other Ambulatory Visit: Payer: Self-pay

## 2020-10-02 ENCOUNTER — Ambulatory Visit: Payer: Medicare HMO | Admitting: Physical Therapy

## 2020-10-02 ENCOUNTER — Emergency Department (HOSPITAL_COMMUNITY)
Admission: EM | Admit: 2020-10-02 | Discharge: 2020-10-02 | Disposition: A | Discharge status: Home / Self Care | Payer: Medicare HMO | Attending: Emergency Medicine | Admitting: Emergency Medicine

## 2020-10-02 ENCOUNTER — Encounter (HOSPITAL_COMMUNITY): Payer: Self-pay

## 2020-10-02 DIAGNOSIS — R531 Weakness: Secondary | ICD-10-CM | POA: Insufficient documentation

## 2020-10-02 DIAGNOSIS — Z20822 Contact with and (suspected) exposure to covid-19: Secondary | ICD-10-CM | POA: Diagnosis not present

## 2020-10-02 DIAGNOSIS — E1136 Type 2 diabetes mellitus with diabetic cataract: Secondary | ICD-10-CM | POA: Insufficient documentation

## 2020-10-02 DIAGNOSIS — E785 Hyperlipidemia, unspecified: Secondary | ICD-10-CM | POA: Diagnosis not present

## 2020-10-02 DIAGNOSIS — Z7982 Long term (current) use of aspirin: Secondary | ICD-10-CM | POA: Diagnosis not present

## 2020-10-02 DIAGNOSIS — R202 Paresthesia of skin: Secondary | ICD-10-CM | POA: Diagnosis not present

## 2020-10-02 DIAGNOSIS — Z79899 Other long term (current) drug therapy: Secondary | ICD-10-CM | POA: Insufficient documentation

## 2020-10-02 DIAGNOSIS — E1169 Type 2 diabetes mellitus with other specified complication: Secondary | ICD-10-CM | POA: Diagnosis not present

## 2020-10-02 DIAGNOSIS — R9431 Abnormal electrocardiogram [ECG] [EKG]: Secondary | ICD-10-CM | POA: Diagnosis not present

## 2020-10-02 DIAGNOSIS — R29810 Facial weakness: Secondary | ICD-10-CM | POA: Diagnosis not present

## 2020-10-02 DIAGNOSIS — I639 Cerebral infarction, unspecified: Secondary | ICD-10-CM | POA: Diagnosis not present

## 2020-10-02 DIAGNOSIS — I10 Essential (primary) hypertension: Secondary | ICD-10-CM | POA: Diagnosis not present

## 2020-10-02 LAB — I-STAT CHEM 8, ED
BUN: 13 mg/dL (ref 8–23)
Calcium, Ion: 1.25 mmol/L (ref 1.15–1.40)
Chloride: 112 mmol/L — ABNORMAL HIGH (ref 98–111)
Creatinine, Ser: 0.6 mg/dL (ref 0.44–1.00)
Glucose, Bld: 118 mg/dL — ABNORMAL HIGH (ref 70–99)
HCT: 37 % (ref 36.0–46.0)
Hemoglobin: 12.6 g/dL (ref 12.0–15.0)
Potassium: 4 mmol/L (ref 3.5–5.1)
Sodium: 145 mmol/L (ref 135–145)
TCO2: 23 mmol/L (ref 22–32)

## 2020-10-02 LAB — CBC
HCT: 40 % (ref 36.0–46.0)
Hemoglobin: 12.7 g/dL (ref 12.0–15.0)
MCH: 30.3 pg (ref 26.0–34.0)
MCHC: 31.8 g/dL (ref 30.0–36.0)
MCV: 95.5 fL (ref 80.0–100.0)
Platelets: 228 10*3/uL (ref 150–400)
RBC: 4.19 MIL/uL (ref 3.87–5.11)
RDW: 13.2 % (ref 11.5–15.5)
WBC: 4.2 10*3/uL (ref 4.0–10.5)
nRBC: 0 % (ref 0.0–0.2)

## 2020-10-02 LAB — APTT: aPTT: 29 seconds (ref 24–36)

## 2020-10-02 LAB — DIFFERENTIAL
Abs Immature Granulocytes: 0.04 10*3/uL (ref 0.00–0.07)
Basophils Absolute: 0 10*3/uL (ref 0.0–0.1)
Basophils Relative: 1 %
Eosinophils Absolute: 0 10*3/uL (ref 0.0–0.5)
Eosinophils Relative: 1 %
Immature Granulocytes: 1 %
Lymphocytes Relative: 36 %
Lymphs Abs: 1.5 10*3/uL (ref 0.7–4.0)
Monocytes Absolute: 0.6 10*3/uL (ref 0.1–1.0)
Monocytes Relative: 13 %
Neutro Abs: 2 10*3/uL (ref 1.7–7.7)
Neutrophils Relative %: 48 %

## 2020-10-02 LAB — URINALYSIS, ROUTINE W REFLEX MICROSCOPIC
Bilirubin Urine: NEGATIVE
Glucose, UA: NEGATIVE mg/dL
Hgb urine dipstick: NEGATIVE
Ketones, ur: NEGATIVE mg/dL
Leukocytes,Ua: NEGATIVE
Nitrite: NEGATIVE
Protein, ur: NEGATIVE mg/dL
Specific Gravity, Urine: 1.005 (ref 1.005–1.030)
pH: 8 (ref 5.0–8.0)

## 2020-10-02 LAB — COMPREHENSIVE METABOLIC PANEL
ALT: 30 U/L (ref 0–44)
AST: 21 U/L (ref 15–41)
Albumin: 3.9 g/dL (ref 3.5–5.0)
Alkaline Phosphatase: 71 U/L (ref 38–126)
Anion gap: 6 (ref 5–15)
BUN: 12 mg/dL (ref 8–23)
CO2: 25 mmol/L (ref 22–32)
Calcium: 9.4 mg/dL (ref 8.9–10.3)
Chloride: 112 mmol/L — ABNORMAL HIGH (ref 98–111)
Creatinine, Ser: 0.74 mg/dL (ref 0.44–1.00)
GFR, Estimated: >60 mL/min (ref >60)
Glucose, Bld: 122 mg/dL — ABNORMAL HIGH (ref 70–99)
Potassium: 4 mmol/L (ref 3.5–5.1)
Sodium: 143 mmol/L (ref 135–145)
Total Bilirubin: 0.7 mg/dL (ref 0.3–1.2)
Total Protein: 6.9 g/dL (ref 6.5–8.1)

## 2020-10-02 LAB — RAPID URINE DRUG SCREEN, HOSP PERFORMED
Amphetamines: NOT DETECTED
Barbiturates: NOT DETECTED
Benzodiazepines: NOT DETECTED
Cocaine: NOT DETECTED
Opiates: NOT DETECTED
Tetrahydrocannabinol: NOT DETECTED

## 2020-10-02 LAB — RESP PANEL BY RT-PCR (FLU A&B, COVID) ARPGX2
Influenza A by PCR: NEGATIVE
Influenza B by PCR: NEGATIVE
SARS Coronavirus 2 by RT PCR: NEGATIVE

## 2020-10-02 LAB — CBG MONITORING, ED: Glucose-Capillary: 117 mg/dL — ABNORMAL HIGH (ref 70–99)

## 2020-10-02 LAB — PROTIME-INR
INR: 1 (ref 0.8–1.2)
Prothrombin Time: 13.4 seconds (ref 11.4–15.2)

## 2020-10-02 MED ORDER — CLOPIDOGREL BISULFATE 75 MG PO TABS: 75.0000 mg | ORAL_TABLET | Freq: Every day | ORAL | 0 refills | Status: DC

## 2020-10-02 NOTE — Code Documentation (Signed)
Stroke Response Nurse Documentation Code Documentation  Dana [name] is a 75 y.o. female arriving to [facility] ED via [facility] on 10/02/2020 with past medical hx of stroke. Code stroke was activated by EMS. Patient from home where she was LKW at [phone] and now complaining of left sided arm and leg numbness and tingling. Pt woke up at her baseline and noted the sudden change while ambulating.   Stroke team at the bedside on patient activation. Patient cleared for CT by EDP. Patient to CT with team. NIHSS 0 with subjective numbness and tingling that continues. When objectively touched on both sides, pt reports they feel the same. See documentation for details and code stroke times. The following imaging was completed: CT. Patient is not a candidate for tPA due to being too mild to treat.   Care/Plan: q30 mNIHSS/VS until 1245. Patient counseled to notify a healthcare provider if any symptoms change.   Bedside handoff with ED RN CHloe.    Kathrin Greathouse  Stroke Response RN

## 2020-10-02 NOTE — ED Triage Notes (Addendum)
Pt bib [facility] from home. Pt woke up this morning normal, then after ambulating for abt 15 mins pt started noticing L hand tingling and numbness. L leg heaviness. Pt had a stroke back in April which affected pt's R side. Pt had a NIH of 0 with EMS. LSN 8:15 am   BP 162/82 HR 72  O2 96%  CBG 118

## 2020-10-02 NOTE — ED Notes (Signed)
Called Code stroke into [facility]

## 2020-10-02 NOTE — Discharge Instructions (Addendum)
Please follow-up with your primary neurologist about your tingling sensation today.  Our neurologist recommends that you take Plavix every day for 3 months given your symptoms.  Your MRI did not show a new stroke today.

## 2020-10-02 NOTE — Consult Note (Signed)
Neurology Consultation Reason for Consult: Left-sided numbness   CC: Left-sided numbness  History is obtained from: Patient, chart review  HPI: Dana [name] is a 75 y.o. female with a history of hyperlipidemia, migraine who presented with small subcortical stroke and was evaluated for this back in April.  She was in her normal state of health until 815 this morning.  At that time she began having left-sided numbness and tingling.  She also thinks there may have been some slight weakness and therefore she came to the emergency department via EMS who activated a code stroke.  Her symptoms were rapidly improving at the time of my evaluation and though she still had some subjective tingling, her symptoms had essentially resolved.  She was on Plavix for 18 days from the time of discharge, and recently stopped this.   LKW: 8:15 AM tpa given?: no, mild symptoms    ROS: A 14 point ROS was performed and is negative except as noted in the HPI.   Past Medical History:  Diagnosis Date  . Allergy   . Anemia   . Arthritis   . Cataract   . Controlled type 2 diabetes mellitus without complication, without long-term current use of insulin ([place])   . Diverticulitis   . History of chicken pox   . Hyperlipidemia   . Kidney stone   . 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 Migraine   . Urge incontinence 04/29/2014     Family History  Problem Relation Age of Onset  . Heart disease Mother 37  . Hyperlipidemia Mother   . Hyperlipidemia Father   . Stroke Father   . Heart disease Father   . Diabetes Father   . Fibromyalgia Sister   . Hyperlipidemia Sister   . Heart disease Brother   . Diabetes Brother      Social History:  reports that she has never smoked. She has never used smokeless tobacco. She reports that she does not drink alcohol and does not use drugs.   Exam: Current vital signs: BP 128/67   Pulse 72   Temp 98.1 F (36.7 C)   Resp 18   Ht 5\' 3"  (1.6 m)   Wt 74.8 kg   SpO2 94%    BMI 29.23 kg/m  Vital signs in last 24 hours: Temp:  [98.1 F (36.7 C)] 98.1 F (36.7 C) (05/27 1009) Pulse Rate:  [65-79] 72 (05/27 1341) Resp:  [13-18] 18 (05/27 1341) BP: (128-160)/(67-128) 128/67 (05/27 1341) SpO2:  [94 %-97 %] 94 % (05/27 1341) Weight:  [74.8 kg] 74.8 kg (05/27 1012)   Physical Exam  Constitutional: Appears well-developed and well-nourished.  Psych: Affect appropriate to situation Eyes: No scleral injection HENT: No OP obstruction MSK: no joint deformities.  Cardiovascular: Normal rate and regular rhythm.  Respiratory: Effort normal, non-labored breathing GI: Soft.  No distension. There is no tenderness.  Skin: WDI  Neuro: Mental Status: Patient is awake, alert, oriented to person, place, month, year, and situation. Patient is able to give a clear and coherent history. No signs of aphasia or neglect Cranial Nerves: II: Visual Fields are full. Pupils are equal, round, and reactive to light.   III,IV, VI: EOMI without ptosis or diploplia.  V: Facial sensation is symmetric to temperature VII: Facial movement is symmetric.  VIII: hearing is intact to voice X: Uvula elevates symmetrically XI: Shoulder shrug is symmetric. XII: tongue is midline without atrophy or fasciculations.  Motor: Tone is normal. Bulk is normal. 5/5 strength  was present in all four extremities.  Sensory: Sensation is symmetric to light touch and temperature in the arms and legs. Deep Tendon Reflexes: 2+ and symmetric in the biceps and patellae.  Plantars: Toes are downgoing bilaterally.  Cerebellar: FNF and HKS are intact bilaterally      I have reviewed labs in epic and the results pertinent to this consultation are: Creatinine 0.7  I have reviewed the images obtained: CT/CTA-negative, she has a very small increase in size of the infarct seen previously, but it does not appear significantly dark on ADC and therefore I think this represents evolution of the previously  seen stroke.  Impression: 75 year old female with transient left-sided numbness in the setting of recent subcortical stroke.  Though it is possible that this represents TIA, with positive symptoms complicated migraine is also a possibility.  In any case, I do not think that there would be much utility in readmission to repeat the testing that was just done within the past month, but I do think it would be reasonable to restart dual antiplatelet therapy for the time being.  Recommendations: 1) restart Plavix 75 mg daily for at least three more weeks 2) follow-up with outpatient neurology as previously scheduled.  Roland Rack, MD [facility] [phone]  If 7pm- 7am, please page neurology on call as listed in [place].

## 2020-10-02 NOTE — ED Provider Notes (Signed)
  Physical Exam  BP 140/90   Pulse 68   Temp 98.6 F (37 C) (Oral)   Resp 15   Ht 5\' 3"  (1.6 m)   Wt 74.8 kg   SpO2 100%   BMI 29.23 kg/m   Physical Exam Constitutional:      General: She is not in acute distress. HENT:     Head: Normocephalic and atraumatic.  Cardiovascular:     Rate and Rhythm: Normal rate.  Pulmonary:     Effort: Pulmonary effort is normal. No respiratory distress.  Abdominal:     General: Abdomen is flat. There is no distension.  Neurological:     Mental Status: She is alert.  Psychiatric:        Behavior: Behavior normal.        Thought Content: Thought content normal.     ED Course/Procedures     Procedures  MDM  Pt presents with left upper and lower sided tingling. Recent stroke with right sided weakness, RLE weaker than RUE. CT code stroke wnl, labs unremarkable. Pending MRI.   MRI shows increase in subacute left thalamocapsular infarct that was previously noted; this is not overall consistent with the patient's constellation of symptoms today.  Her symptoms are resolved on my repeat evaluation.  I discussed her case with the neurologist who recommends discharge on her Plavix with outpatient follow-up.  Patient agrees to follow-up with her neurologist.  Strict return precautions discussed.  Patient understood.     Aris Lot, MD 10/03/20 0011    Rex Kras Wenda Over[place], MD 10/03/20 [phone]

## 2020-10-02 NOTE — ED Provider Notes (Signed)
[facility] EMERGENCY DEPARTMENT Provider Note   CSN: [id number] Arrival date & time: 10/02/20  5427  An emergency department physician performed an initial assessment on this suspected stroke patient at 1014.  History Chief Complaint  Patient presents with  . Tingling    Dana [name] is a 75 y.o. female.  HPI 75 year old female with history of recent stroke with residual right side weakness, diabetes, hyperlipidemia, and migraines presents emergency department for left leg weakness along with left upper and lower extremity tingling.  States she woke up this morning in her normal state of health, was walking with her walker about 15 minutes and had sudden onset feeling of heaviness in her left lower extremity.  Denies history of anything at this previously.  This was at 815 this morning.  States it felt like her leg was heavy, weak, and tingling.  This has been constant and unchanged since onset.  Additionally, she did start having left hand tingling and altered sensation.  Has not taken anything for the symptoms.  Nothing is made the better, nothing has made them worse.    Past Medical History:  Diagnosis Date  . Allergy   . Anemia   . Arthritis   . Cataract   . Controlled type 2 diabetes mellitus without complication, without long-term current use of insulin ([place])   . Diverticulitis   . History of chicken pox   . Hyperlipidemia   . Kidney stone   . 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 Migraine   . Urge incontinence 04/29/2014    Patient Active Problem List   Diagnosis Date Noted  . Dysesthesia 09/21/2020  . Hemiparesis due to recent stroke ([place]) 08/30/2020  . Right sided weakness 08/29/2020  . Pain in lateral portion of right ankle 07/20/2020  . Plantar fasciitis of right foot 05/19/2020  . Small vessel disease, cerebrovascular   . Pes anserine bursitis 04/09/2019  . Skin rash 04/09/2019  . Red skin 02/25/2019  . Overactive bladder 10/24/2018  .  Amnesia memory loss 05/19/2017  . Overweight (BMI 25.0-29.9) 05/09/2017  . Lumbar herniated disc 02/01/2017  . Left shoulder pain 09/30/2016  . Medicare annual wellness visit, subsequent 05/03/2016  . 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 History of kidney stones   . Cervical pain (neck) 12/04/2015  . Health maintenance examination 04/29/2014  . Paresthesia 04/29/2014  . Advanced care planning/counseling discussion 04/29/2014  . PFD (pelvic floor dysfunction) 04/29/2014  . Controlled diabetes mellitus type 2 with complications ([place]) 10/30/7626  . 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  . Hx of diverticulitis of colon 11/07/2012  . Allergic rhinitis 11/07/2012  . Hyperlipidemia associated with type 2 diabetes mellitus ([place]) 11/07/2012  . Migraine 11/07/2012  . Hereditary and idiopathic peripheral neuropathy 11/07/2012  . LGSIL Pap smear of vaginal cuff 11/07/2012  . GERD (gastroesophageal reflux disease) 11/07/2012  . Family history of premature coronary heart disease 11/07/2012    Past Surgical History:  Procedure Laterality Date  . ABDOMINAL HYSTERECTOMY  2006   cervix remains, menorrhagia  . APPENDECTOMY  1970  . [place]   biopsy-left  . BREAST SURGERY  1995   right milk duct removed  . CARPAL TUNNEL RELEASE Right 02/2017  . CHOLECYSTECTOMY  1971  . COLONOSCOPY  11/2005   diverticulosis o/w WNL [facility])  . DEXA  2015   spine -0.4, hip -1.0 WNL  . LITHOTRIPSY    . LUMBAR EPIDURAL INJECTION Left 10/2017   transforaminal L L3/4 (  Dr Maryjean Ka)  . TUBAL LIGATION       OB History   No obstetric history on file.     Family History  Problem Relation Age of Onset  . Heart disease Mother 45  . Hyperlipidemia Mother   . Hyperlipidemia Father   . Stroke Father   . Heart disease Father   . Diabetes Father   . Fibromyalgia Sister   . Hyperlipidemia Sister   . Heart disease Brother   . Diabetes Brother     Social History   Tobacco Use  . Smoking status: Never  Smoker  . Smokeless tobacco: Never Used  Vaping Use  . Vaping Use: Never used  Substance Use Topics  . Alcohol use: No    Alcohol/week: 0.0 standard drinks  . Drug use: No    Home Medications Prior to Admission medications   Medication Sig Start Date End Date Taking? Authorizing Provider  acetaminophen (TYLENOL) 325 MG tablet Take 1 tablet (325 mg total) by mouth every 6 (six) hours as needed for mild pain. 09/01/20  Yes Elgergawy, Silver Huguenin, MD  aspirin 81 MG tablet Take 81 mg by mouth daily.   Yes [provider]  atorvastatin (LIPITOR) 40 MG tablet TAKE 1 TABLET (40 MG TOTAL) BY MOUTH DAILY. Patient taking differently: Take 40 mg by mouth at bedtime. 08/24/20  Yes [name], MD  butalbital-acetaminophen-caffeine [facility]) [phone] MG tablet Take 1 tablet by mouth every 6 (six) hours as needed for headache. 10/28/19  Yes Suzzanne Cloud, NP  co-enzyme Q-10 30 MG capsule Take 30 mg by mouth at bedtime.   Yes [provider]  fluticasone (FLONASE) 50 MCG/ACT nasal spray USE 2 SPRAYS IN EACH NOSTRIL EVERY DAY Patient taking differently: Place 2 sprays into both nostrils at bedtime. 09/23/20  Yes [name], MD  gabapentin (NEURONTIN) 100 MG capsule Take 1-2 capsules (100-200 mg total) by mouth at bedtime as needed. Patient taking differently: Take 100-200 mg by mouth at bedtime. 09/21/20  Yes [name], MD  lactase (LACTAID) 3000 units tablet Take 2 tablets (6,000 Units total) by mouth 3 (three) times daily with meals. Patient taking differently: Take 6,000 Units by mouth See admin instructions. Take 6,000 units (2 tablets) by mouth up to three times a day WITH DAIRY PRODUCTS 09/01/20  Yes Elgergawy, Silver Huguenin, MD  methocarbamol (ROBAXIN) 500 MG tablet TAKE 1 TABLET (500 MG TOTAL) BY MOUTH 3 (THREE) TIMES DAILY AS NEEDED FOR MUSCLE SPASMS (SEDATION PRECAUTIONS). Patient taking differently: Take 500 mg by mouth 3 (three) times daily as needed for muscle  spasms (or migraines- sedation precautions). 01/03/20  Yes [name], MD  Multiple Vitamin (MULTIVITAMIN) tablet Take 1 tablet by mouth at bedtime.   Yes [provider]  omeprazole (PRILOSEC) 20 MG capsule Take 1 capsule (20 mg total) by mouth daily. Patient taking differently: Take 20 mg by mouth daily before breakfast. 10/04/19  Yes [name], MD  ondansetron (ZOFRAN ODT) 4 MG disintegrating tablet Take 1 tablet (4 mg total) by mouth every 8 (eight) hours as needed. Patient taking differently: Take 4 mg by mouth every 8 (eight) hours as needed for vomiting or nausea (dissolve orally). 04/07/20  Yes [name], MD  THERATEARS PF 0.25 % SOLN Place 2 drops into both eyes in the morning.   Yes [provider]  topiramate (TOPAMAX) 25 MG tablet Take 3 tablets (75 mg total) by mouth at bedtime. Patient taking differently: Take 25-50 mg by mouth See admin instructions.  Take 25 mg by mouth in the morning and 50 mg at bedtime 10/28/19  Yes Suzzanne Cloud, NP  triamcinolone cream (KENALOG) 0.1 % APPLY TO AFFECTED AREA TWICE A DAY Patient taking differently: Apply 1 application topically 2 (two) times daily as needed (to affected areas under the breasts- for irritation). 12/18/19  Yes [name], MD  vitamin B-12 (CYANOCOBALAMIN) 1000 MCG tablet Take 1 tablet (1,000 mcg total) by mouth every Monday, Wednesday, and Friday. 09/26/18  Yes [name], MD  Alcohol Swabs (B-D SINGLE USE SWABS REGULAR) PADS Check blood sugar once daily 09/29/20   [name], MD  Blood Glucose Calibration (TRUE METRIX LEVEL 1) Low SOLN Use to check meter 09/29/20   [name], MD  Blood Glucose Monitoring Suppl (TRUE METRIX METER) w/Device KIT Check blood sugar once daily 09/29/20   [name], MD  glucose blood (TRUE METRIX BLOOD GLUCOSE TEST) test strip Check blood sugar once daily 09/29/20   [name], MD  ibuprofen (ADVIL) 600 MG tablet Take 600 mg by  mouth in the morning. Patient not taking: Reported on 10/02/2020    [provider]  TRUEplus Lancets 33G MISC Check blood sugar once daily 09/29/20   [name], MD    Allergies    Amitriptyline, Lactose intolerance (gi), Oxybutynin, Tape, Tramadol, Niacin and related, and Septra [sulfamethoxazole-trimethoprim]  Review of Systems   Review of Systems  Constitutional: Negative for chills and fever.  HENT: Negative for ear pain and sore throat.   Eyes: Negative for pain and visual disturbance.  Respiratory: Negative for cough and shortness of breath.   Cardiovascular: Negative for chest pain and palpitations.  Gastrointestinal: Negative for abdominal pain and vomiting.  Genitourinary: Negative for dysuria and hematuria.  Musculoskeletal: Negative for arthralgias and back pain.  Skin: Negative for color change and rash.  Neurological: Positive for weakness and numbness. Negative for seizures and syncope.  Psychiatric/Behavioral: The patient is not nervous/anxious.   All other systems reviewed and are negative.   Physical Exam Updated Vital Signs BP 128/67   Pulse 72   Temp 98.1 F (36.7 C)   Resp 18   Ht '5\' 3"'  (1.6 m)   Wt 74.8 kg   SpO2 94%   BMI 29.23 kg/m   Physical Exam Vitals and nursing note reviewed.  Constitutional:      General: She is not in acute distress.    Appearance: Normal appearance. She is well-developed.  HENT:     Head: Normocephalic and atraumatic.     Right Ear: External ear normal.     Left Ear: External ear normal.     Nose: Nose normal.     Mouth/Throat:     Mouth: Mucous membranes are moist.  Eyes:     Extraocular Movements: Extraocular movements intact.     Conjunctiva/sclera: Conjunctivae normal.     Pupils: Pupils are equal, round, and reactive to light.  Cardiovascular:     Rate and Rhythm: Normal rate and regular rhythm.     Heart sounds: No murmur heard.   Pulmonary:     Effort: Pulmonary effort is normal. No  respiratory distress.     Breath sounds: Normal breath sounds.  Abdominal:     General: Abdomen is flat.     Palpations: Abdomen is soft.     Tenderness: There is no abdominal tenderness.  Musculoskeletal:        General: Normal range of motion.     Cervical back: Neck supple. No tenderness.  Right lower leg: No edema.     Left lower leg: No edema.  Skin:    General: Skin is warm and dry.     Capillary Refill: Capillary refill takes less than 2 seconds.  Neurological:     Mental Status: She is alert.     Comments: Sensation intact bilaterally, the patient still reports tingling to left upper and lower extremity.  Patient is able to elevate left lower extremity against gravity without significant drift, but does report it feels weaker than normal.  Bilateral lower extremities 4/5.  Bilateral upper extremities 5/5.  No facial droop or other cranial nerve deficit on exam.  Psychiatric:        Mood and Affect: Mood normal.        Behavior: Behavior normal.     ED Results / Procedures / Treatments   Labs (all labs ordered are listed, but only abnormal results are displayed) Labs Reviewed  COMPREHENSIVE METABOLIC PANEL - Abnormal; Notable for the following components:      Result Value   Chloride 112 (*)    Glucose, Bld 122 (*)    All other components within normal limits  URINALYSIS, ROUTINE W REFLEX MICROSCOPIC - Abnormal; Notable for the following components:   Color, Urine COLORLESS (*)    All other components within normal limits  CBG MONITORING, ED - Abnormal; Notable for the following components:   Glucose-Capillary 117 (*)    All other components within normal limits  I-STAT CHEM 8, ED - Abnormal; Notable for the following components:   Chloride 112 (*)    Glucose, Bld 118 (*)    All other components within normal limits  RESP PANEL BY RT-PCR (FLU A&B, COVID) ARPGX2  PROTIME-INR  APTT  CBC  DIFFERENTIAL  RAPID URINE DRUG SCREEN, HOSP PERFORMED  ETHANOL     EKG EKG Interpretation  Date/Time:  Friday Oct 02 2020 10:06:20 EDT Ventricular Rate:  80 PR Interval:  150 QRS Duration: 99 QT Interval:  430 QTC Calculation: 497 R Axis:   60 Text Interpretation: Sinus rhythm Probable left atrial enlargement Low voltage, precordial leads Borderline T abnormalities, anterior leads Borderline prolonged QT interval when compared to prior, similar to prior. NO STEMI Confirmed by Antony Blackbird [phone]) on 10/02/2020 10:13:19 AM   Radiology CT HEAD CODE STROKE WO CONTRAST  Result Date: 10/02/2020 CLINICAL DATA:  Code stroke.  Left hand numbness and tingling. EXAM: CT HEAD WITHOUT CONTRAST TECHNIQUE: Contiguous axial images were obtained from the base of the skull through the vertex without intravenous contrast. COMPARISON:  08/29/2020 FINDINGS: Brain: No evidence of acute infarction, hemorrhage, hydrocephalus, extra-axial collection or mass lesion/mass effect. Chronic lacunar and perforator infarcts at the deep gray nuclei and right corona radiata. Senescent or metabolic mineralization at the globus pallidus and bilateral deep cerebellum. Vascular: Atheromatous calcification.  No hyperdense vessel. Skull: No acute fracture. Sinuses/Orbits: Negative Other: These results were communicated to Dr. Leonel Ramsay at 10:32 amon 5/27/2022by text page via the [facility] messaging system. ASPECTS [facility] Stroke Program Early CT Score) - Ganglionic level infarction (caudate, lentiform nuclei, internal capsule, insula, M1-M3 cortex): 7 - Supraganglionic infarction (M4-M6 cortex): 3 Total score (0-10 with 10 being normal): 10 IMPRESSION: 1. No acute finding. 2. Chronic small vessel disease and small vessel infarcts. Electronically Signed   By: Monte Fantasia M.D.   On: 10/02/2020 10:33    Procedures Procedures   Medications Ordered in ED Medications - No data to display  ED Course  I have reviewed the  triage vital signs and the nursing notes.  Pertinent labs & imaging  results that were available during my care of the patient were reviewed by me and considered in my medical decision making (see chart for details).    MDM Rules/Calculators/A&P                          75 year old female presents emergency department for left lower extremity weakness and left-sided numbness/tingling.  Vital signs are stable, the patient is mildly hypertensive.  She is not in acute distress.  Upon hearing last known normal and with patient reported symptoms, code stroke was initiated.  Neurology came to bedside to evaluate patient.  Agree patient needs MRI for further evaluation.  Labs ordered as well to further stratify.  Differential includes: Stroke, intracranial hemorrhage, hypertensive emergency, spinal pathology, atypical migraine  EKG showed sinus rhythm with flattened baseline, no acute ischemic changes.  CT stroke protocol without acute bleed or stroke.  Labs grossly unremarkable, is mildly hyperglycemic.  No leukocytosis.  No urinary tract infection on UA, UDS negative.  COVID-negative.  Neurology recommends and ordered an MRI for further evaluation of possible stroke.  Patient does report improving symptoms upon reevaluation.  MRI pending at time of handoff.  Plan is to follow this up along with neurology recommendations after MRI is done.  Care of patient handed off to Dr. Tyrone Apple at 3:15 PM.  Final Clinical Impression(s) / ED Diagnoses Final diagnoses:  Tingling    Rx / DC Orders ED Discharge Orders    None       Suzan Nailer, DO 10/02/20 1515    Tegeler, Gwenyth Allegra, MD 10/03/20 1210

## 2020-10-02 NOTE — ED Notes (Signed)
Patient transported to MRI 

## 2020-10-06 ENCOUNTER — Encounter: Payer: Self-pay | Admitting: Family Medicine

## 2020-10-06 ENCOUNTER — Other Ambulatory Visit: Payer: Self-pay

## 2020-10-06 ENCOUNTER — Ambulatory Visit: Payer: Medicare HMO

## 2020-10-06 ENCOUNTER — Ambulatory Visit (INDEPENDENT_AMBULATORY_CARE_PROVIDER_SITE_OTHER): Payer: Medicare HMO | Admitting: Family Medicine

## 2020-10-06 ENCOUNTER — Ambulatory Visit: Payer: Medicare HMO | Admitting: Occupational Therapy

## 2020-10-06 ENCOUNTER — Encounter: Payer: Self-pay | Admitting: Occupational Therapy

## 2020-10-06 VITALS — BP 120/70 | HR 67 | Temp 98.2°F | Ht 62.5 in | Wt 166.0 lb

## 2020-10-06 DIAGNOSIS — R4701 Aphasia: Secondary | ICD-10-CM | POA: Diagnosis not present

## 2020-10-06 DIAGNOSIS — E118 Type 2 diabetes mellitus with unspecified complications: Secondary | ICD-10-CM

## 2020-10-06 DIAGNOSIS — R2681 Unsteadiness on feet: Secondary | ICD-10-CM

## 2020-10-06 DIAGNOSIS — Z0000 Encounter for general adult medical examination without abnormal findings: Secondary | ICD-10-CM

## 2020-10-06 DIAGNOSIS — R262 Difficulty in walking, not elsewhere classified: Secondary | ICD-10-CM

## 2020-10-06 DIAGNOSIS — M6281 Muscle weakness (generalized): Secondary | ICD-10-CM | POA: Diagnosis not present

## 2020-10-06 DIAGNOSIS — E1169 Type 2 diabetes mellitus with other specified complication: Secondary | ICD-10-CM

## 2020-10-06 DIAGNOSIS — R2689 Other abnormalities of gait and mobility: Secondary | ICD-10-CM

## 2020-10-06 DIAGNOSIS — R278 Other lack of coordination: Secondary | ICD-10-CM

## 2020-10-06 DIAGNOSIS — K219 Gastro-esophageal reflux disease without esophagitis: Secondary | ICD-10-CM

## 2020-10-06 DIAGNOSIS — I69851 Hemiplegia and hemiparesis following other cerebrovascular disease affecting right dominant side: Secondary | ICD-10-CM

## 2020-10-06 DIAGNOSIS — G43109 Migraine with aura, not intractable, without status migrainosus: Secondary | ICD-10-CM

## 2020-10-06 DIAGNOSIS — I69359 Hemiplegia and hemiparesis following cerebral infarction affecting unspecified side: Secondary | ICD-10-CM

## 2020-10-06 DIAGNOSIS — R208 Other disturbances of skin sensation: Secondary | ICD-10-CM

## 2020-10-06 DIAGNOSIS — E785 Hyperlipidemia, unspecified: Secondary | ICD-10-CM

## 2020-10-06 DIAGNOSIS — R87622 Low grade squamous intraepithelial lesion on cytologic smear of vagina (LGSIL): Secondary | ICD-10-CM

## 2020-10-06 NOTE — Assessment & Plan Note (Signed)

## 2020-10-06 NOTE — Patient Instructions (Addendum)
We will request latest mammogram from [facility].  Good to see you today Return as needed or in 6 months for follow up visit.   Health Maintenance After Age 74 After age 72, you are at a higher risk for certain long-term diseases and infections as well as injuries from falls. Falls are a major cause of broken bones and head injuries in people who are older than age 15. Getting regular preventive care can help to keep you healthy and well. Preventive care includes getting regular testing and making lifestyle changes as recommended by your health care provider. Talk with your health care provider about:  Which screenings and tests you should have. A screening is a test that checks for a disease when you have no symptoms.  A diet and exercise plan that is right for you. What should I know about screenings and tests to prevent falls? Screening and testing are the best ways to find a health problem early. Early diagnosis and treatment give you the best chance of managing medical conditions that are common after age 46. Certain conditions and lifestyle choices may make you more likely to have a fall. Your health care provider may recommend:  Regular vision checks. Poor vision and conditions such as cataracts can make you more likely to have a fall. If you wear glasses, make sure to get your prescription updated if your vision changes.  Medicine review. Work with your health care provider to regularly review all of the medicines you are taking, including over-the-counter medicines. Ask your health care provider about any side effects that may make you more likely to have a fall. Tell your health care provider if any medicines that you take make you feel dizzy or sleepy.  Osteoporosis screening. Osteoporosis is a condition that causes the bones to get weaker. This can make the bones weak and cause them to break more easily.  Blood pressure screening. Blood pressure changes and medicines to control blood  pressure can make you feel dizzy.  Strength and balance checks. Your health care provider may recommend certain tests to check your strength and balance while standing, walking, or changing positions.  Foot health exam. Foot pain and numbness, as well as not wearing proper footwear, can make you more likely to have a fall.  Depression screening. You may be more likely to have a fall if you have a fear of falling, feel emotionally low, or feel unable to do activities that you used to do.  Alcohol use screening. Using too much alcohol can affect your balance and may make you more likely to have a fall. What actions can I take to lower my risk of falls? General instructions  Talk with your health care provider about your risks for falling. Tell your health care provider if: ? You fall. Be sure to tell your health care provider about all falls, even ones that seem minor. ? You feel dizzy, sleepy, or off-balance.  Take over-the-counter and prescription medicines only as told by your health care provider. These include any supplements.  Eat a healthy diet and maintain a healthy weight. A healthy diet includes low-fat dairy products, low-fat (lean) meats, and fiber from whole grains, beans, and lots of fruits and vegetables. Home safety  Remove any tripping hazards, such as rugs, cords, and clutter.  Install safety equipment such as grab bars in bathrooms and safety rails on stairs.  Keep rooms and walkways well-lit. Activity  Follow a regular exercise program to stay fit. This will  help you maintain your balance. Ask your health care provider what types of exercise are appropriate for you.  If you need a cane or walker, use it as recommended by your health care provider.  Wear supportive shoes that have nonskid soles.   Lifestyle  Do not drink alcohol if your health care provider tells you not to drink.  If you drink alcohol, limit how much you have: ? 0-1 drink a day for  women. ? 0-2 drinks a day for men.  Be aware of how much alcohol is in your drink. In the [place] one drink equals one typical bottle of beer (12 oz), one-half glass of wine (5 oz), or one shot of hard liquor (1 oz).  Do not use any products that contain nicotine or tobacco, such as cigarettes and e-cigarettes. If you need help quitting, ask your health care provider. Summary  Having a healthy lifestyle and getting preventive care can help to protect your health and wellness after age 48.  Screening and testing are the best way to find a health problem early and help you avoid having a fall. Early diagnosis and treatment give you the best chance for managing medical conditions that are more common for people who are older than age 51.  Falls are a major cause of broken bones and head injuries in people who are older than age 69. Take precautions to prevent a fall at home.  Work with your health care provider to learn what changes you can make to improve your health and wellness and to prevent falls. This information is not intended to replace advice given to you by your health care provider. Make sure you discuss any questions you have with your health care provider. Document Revised: 08/16/2018 Document Reviewed: 03/08/2017 Elsevier Patient Education  2021 [employer].

## 2020-10-06 NOTE — Assessment & Plan Note (Signed)
Preventative protocols reviewed and updated unless pt declined. Discussed healthy diet and lifestyle.  

## 2020-10-06 NOTE — Therapy (Signed)
[facility] [address] [place], [place], [address] Phone: [phone]   Fax:  [phone]  Physical Therapy Treatment  Patient Details  Name: Dana [name] MRN: [id number] Date of Birth: 1945-10-29 Referring Provider (PT): Referred by: [name], NP. PCP is [name], MD   Encounter Date: 10/06/2020   PT End of Session - 10/06/20 1529    Visit Number 4    Number of Visits 17    Date for PT Re-Evaluation 12/15/20   POC for 8 weeks, Cert for 90 days   Authorization Type [employer] (10th Visit PN)    Authorization Time Period awaiting authorization    Progress Note Due on Visit 10    PT Start Time 1530    PT Stop Time 1614    PT Time Calculation (min) 44 min    Equipment Utilized During Treatment Gait belt    Activity Tolerance Patient tolerated treatment well    Behavior During Therapy [facility] for tasks assessed/performed           Past Medical History:  Diagnosis Date  . Allergy   . Anemia   . Arthritis   . Cataract   . Controlled type 2 diabetes mellitus without complication, without long-term current use of insulin ([place])   . Diverticulitis   . History of chicken pox   . Hyperlipidemia   . Kidney stone   . 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 Migraine   . Urge incontinence 04/29/2014    Past Surgical History:  Procedure Laterality Date  . ABDOMINAL HYSTERECTOMY  2006   cervix remains, menorrhagia  . APPENDECTOMY  1970  . [place]   biopsy-left  . BREAST SURGERY  1995   right milk duct removed  . CARPAL TUNNEL RELEASE Right 02/2017  . CHOLECYSTECTOMY  1971  . COLONOSCOPY  11/2005   diverticulosis o/w WNL [facility])  . DEXA  2015   spine -0.4, hip -1.0 WNL  . LITHOTRIPSY    . LUMBAR EPIDURAL INJECTION Left 10/2017   transforaminal L L3/4 (Dr Maryjean Ka)  . TUBAL LIGATION      There were no vitals filed for this visit.   Subjective Assessment - 10/06/20 1529     Subjective Patient reports went to ED due to increased tingling, but reports imaging come back normal. Reports the MRI was terrible. No falls. Brought in new shoes to try brace with.    Patient is accompained by: Family member   Husband [name])   Pertinent History Migraines, HLD, DM, GERD, Lactose Intolerance, Kidney Stones, Diverticulitis, Anemia, Incontinence    Limitations Sitting;Standing;Walking    How long can you walk comfortably? 10 minutes    Patient Stated Goals Walk without Assistance;    Currently in Pain? No/denies              [facility] Adult PT Treatment/Exercise - 10/06/20 0001      Transfers   Transfers Sit to Stand;Stand to Sit    Sit to Stand 5: Supervision;4: Min guard    Stand to Sit 5: Supervision;4: Min guard    Comments one cue required to lock brakes on rollator, improved carryover noted throughout session.      Ambulation/Gait   Ambulation/Gait Yes    Ambulation/Gait Assistance 5: Supervision    Ambulation/Gait Assistance Details Patient brought in new shoes with removable insert to ambulate with AFO. Continued gait training with assessing AFO options for RLE. Ambulated with R Post Ottobok walk  on x 200 ft, improved gait pattern noted with no pain reported. Tried Thuasne R Posterior brace x 200 ft, no significant changes noted with this brace donned, and patient reports mild discomfort. Patient reports improved comfort with R Posterior Ottobok AFO.    Ambulation Distance (Feet) 200 Feet   x 2   Assistive device Rollator    Gait Pattern Step-through pattern;Decreased arm swing - right;Decreased arm swing - left;Decreased step length - right;Decreased step length - left;Decreased stance time - right;Decreased hip/knee flexion - right;Decreased dorsiflexion - right;Decreased weight shift to right;Poor foot clearance - right    Ambulation Surface Level;Indoor      Exercises   Exercises Knee/Hip;Other Exercises    Other Exercises  Completed resisted terminal knee  extension on RLE with red theraband, 2 x 10 reps, cues for isometric hold 3 seconds. Completed weighted hamstring curls on RLE with 3# ankle weights, 2 x 10 reps, cues for slowed control pace. seated rest break required after completion.      Knee/Hip Exercises: Aerobic   Nustep Completed NuStep with BLE/BUE on Level 4 x 6 minutes with cues to maintain pace >/= 45 steps per minute. Patient tolerating well.             PT Short Term Goals - 09/16/20 1740      PT SHORT TERM GOAL #1   Title Patient will be independent with initial HEP for balance/strength (All STGs Due: 10/14/20)    Baseline no  HEP established    Time 4    Period Weeks    Status New    Target Date 10/14/20      PT SHORT TERM GOAL #2   Title Patient will improve gait speed to >/= 1.5 ft/sec with LRAD    Baseline 1.19 ft/sec w/ RW    Time 4    Period Weeks    Status New      PT SHORT TERM GOAL #3   Title Patient will improve Berg Balance to >/= 39/56 to indicate improved balance and reduced fall risk.    Baseline 34/56    Time 4    Period Weeks    Status New      PT SHORT TERM GOAL #4   Title Patient will improve TUG to </= 20 secs w/ LRAD to indicate reduced fall risk.    Baseline 23.56 secs    Time 4    Period Weeks    Status New      PT SHORT TERM GOAL #5   Title Patient will ambulate >/= 500 ft w/ LRAD and supervision for improved household mobility    Baseline 100 ft CGA    Time 4    Period Weeks    Status New             PT Long Term Goals - 09/16/20 1743      PT LONG TERM GOAL #1   Title Patient will be independent with final HEP for balance/strength (All LTGs Due: 11/11/20)    Baseline no HEP established    Time 8    Period Weeks    Status New    Target Date 11/11/20      PT LONG TERM GOAL #2   Title Patient will improve gait speed to >/= 2.0 ft/sec with LRAD    Baseline 1.19 ft/sec    Time 8    Period Weeks    Status New      PT LONG TERM GOAL #3  Title Patient will improve  Berg Balance to >/= 45/56 to indicate improved balance and reduced fall risk    Baseline 34/56    Time 8    Period Weeks    Status New      PT LONG TERM GOAL #4   Title Patient will improve TUG to </= 15 secs w/ LRAD to indicate reduced fall risk    Baseline 23.56 secs    Time 8    Period Weeks    Status New      PT LONG TERM GOAL #5   Title Patient will improve 5xSTS to </= 12 secs to indicate reduced fall risk    Baseline 14.42 secs    Time 8    Period Weeks    Status New      Additional Long Term Goals   Additional Long Term Goals Yes      PT LONG TERM GOAL #6   Title Patient will demo ability to ascend/descend 12 stairs with single rail and supervision for improved ability to ambulate to second floor of home    Baseline not currently completing    Time 8    Period Weeks    Status New      PT LONG TERM GOAL #7   Title Patient will improve FOTO to >/= 65%    Baseline 49%    Time 8    Period Weeks    Status New                 Plan - 10/06/20 1607    Clinical Impression Statement Continued trial of AFO on RLE today during session since patient brought tennis shoes, continue to demo improvements with gait with Posterior Ottobok vs. Posterior Thuasne. Will continue to assess. Rest of session focused on continued RLE strengthening and endurance. Will continue to progress toward all LTGs.    Personal Factors and Comorbidities Comorbidity 3+    Comorbidities Migraines, HLD, DM, GERD, Lactose Intolerance, Kidney Stones, Diverticulitis, Anemia, Incontinence    Examination-Activity Limitations Bed Mobility;Reach Overhead;Sit;Locomotion Level;Stand;Transfers;Stairs    Examination-Participation Restrictions Community Activity;Driving    Stability/Clinical Decision Making Stable/Uncomplicated    Rehab Potential Good    PT Frequency 2x / week    PT Duration 8 weeks    PT Treatment/Interventions ADLs/Self Care Home Management;Electrical Stimulation;Moist  Heat;Cryotherapy;DME Instruction;Gait training;Stair training;Functional mobility training;Therapeutic activities;Balance training;Therapeutic exercise;Neuromuscular re-education;Patient/family education;Orthotic Fit/Training;Passive range of motion;Vestibular    PT Next Visit Plan Continue gait assesment with R AFO (used R Post Ottobok). Continue high level balance, weight shift activites, RLE strengthening. May benefit from [place].    Consulted and Agree with Plan of Care Patient;Family member/caregiver    Family Member Consulted Husband           Patient will benefit from skilled therapeutic intervention in order to improve the following deficits and impairments:  Abnormal gait,Decreased activity tolerance,Decreased endurance,Decreased knowledge of use of DME,Decreased range of motion,Decreased strength,Impaired UE functional use,Difficulty walking,Decreased balance,Decreased safety awareness,Decreased coordination  Visit Diagnosis: Unsteadiness on feet  Difficulty in walking, not elsewhere classified  Muscle weakness (generalized)  Other abnormalities of gait and mobility     Problem List Patient Active Problem List   Diagnosis Date Noted  . Dysesthesia 09/21/2020  . Hemiparesis due to recent stroke ([place]) 08/30/2020  . Right sided weakness 08/29/2020  . Pain in lateral portion of right ankle 07/20/2020  . Plantar fasciitis of right foot 05/19/2020  . Small vessel disease, cerebrovascular   . Pes anserine  bursitis 04/09/2019  . Skin rash 04/09/2019  . Red skin 02/25/2019  . Overactive bladder 10/24/2018  . Amnesia memory loss 05/19/2017  . Overweight (BMI 25.0-29.9) 05/09/2017  . Lumbar herniated disc 02/01/2017  . Left shoulder pain 09/30/2016  . Medicare annual wellness visit, subsequent 05/03/2016  . 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 History of kidney stones   . Cervical pain (neck) 12/04/2015  . Health maintenance examination 04/29/2014  . Paresthesia 04/29/2014   . Advanced care planning/counseling discussion 04/29/2014  . PFD (pelvic floor dysfunction) 04/29/2014  . Controlled diabetes mellitus type 2 with complications ([place]) 23/30/0762  . 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  . Hx of diverticulitis of colon 11/07/2012  . Allergic rhinitis 11/07/2012  . Hyperlipidemia associated with type 2 diabetes mellitus ([place]) 11/07/2012  . Migraine 11/07/2012  . Hereditary and idiopathic peripheral neuropathy 11/07/2012  . LGSIL Pap smear of vaginal cuff 11/07/2012  . GERD (gastroesophageal reflux disease) 11/07/2012  . Family history of premature coronary heart disease 11/07/2012    Jones Bales, PT, DPT 10/06/2020, 4:15 PM  [facility] [address] [place], [place], [address] Phone: [phone]   Fax:  [phone]  Name: Dana [name] MRN: [id number] Date of Birth: 10-17-1945

## 2020-10-06 NOTE — Therapy (Signed)
[facility] [address] [place], [place], [address] Phone: [phone]   Fax:  [phone]  Occupational Therapy Evaluation  Patient Details  Name: Dana [name] MRN: [id number] Date of Birth: 1945-08-17 Referring Provider (OT): [name] MD - follows up with Butler Denmark   Encounter Date: 10/06/2020   OT End of Session - 10/06/20 1643    Visit Number 1    Number of Visits 13    Date for OT Re-Evaluation 12/01/20   12 visits over 8 weeks to allow for scheduling and missed visits   Authorization Type [employer] Medicare    Authorization Time Period VL:[place] $20 copay per day    Authorization - Number of Visits 10    Progress Note Due on Visit 10    OT Start Time 1616    OT Stop Time 1655    OT Time Calculation (min) 39 min    Activity Tolerance Patient tolerated treatment well    Behavior During Therapy [facility] for tasks assessed/performed           Past Medical History:  Diagnosis Date  . Allergy   . Anemia   . Arthritis   . Cataract   . Controlled type 2 diabetes mellitus without complication, without long-term current use of insulin ([place])   . Diverticulitis   . History of chicken pox   . Hyperlipidemia   . Kidney stone   . 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 Migraine   . Urge incontinence 04/29/2014    Past Surgical History:  Procedure Laterality Date  . ABDOMINAL HYSTERECTOMY  2006   cervix remains, menorrhagia  . APPENDECTOMY  1970  . [place]   biopsy-left  . BREAST SURGERY  1995   right milk duct removed  . CARPAL TUNNEL RELEASE Right 02/2017  . CHOLECYSTECTOMY  1971  . COLONOSCOPY  11/2005   diverticulosis o/w WNL [facility])  . DEXA  2015   spine -0.4, hip -1.0 WNL  . LITHOTRIPSY    . LUMBAR EPIDURAL INJECTION Left 10/2017   transforaminal L L3/4 (Dr Maryjean Ka)  . TUBAL LIGATION      There were no vitals filed for this visit.   Subjective Assessment - 10/06/20 1654     Subjective  Pt presents to OPOT s/p CVA with reports of improved grip strength but continuing to have difficulty with fine motor skills.    Pertinent History Migraines, HLD, DM, GERD, Lactose Intolerance, Kidney Stones, Diverticulitis, Anemia, Incontinence    Limitations no driving. fall risk.    Patient Stated Goals "a lot of trouble with fine motor skills" "writing"    Currently in Pain? No/denies             [facility] OT Assessment - 10/06/20 1558      Assessment   Medical Diagnosis CVA ([facility])    Referring Provider (OT) [name] MD - follows up with Butler Denmark    Onset Date/Surgical Date 08/29/20    Hand Dominance Right    Prior Therapy Acute Care/SNF      Precautions   Precautions Fall      Restrictions   Weight Bearing Restrictions No      Balance Screen   Has the patient fallen in the past 6 months No      Home  Environment   Family/patient expects to be discharged to: Private residence    Living Arrangements Spouse/significant other   husband + cat  Available Help at Discharge Family    Type of Home Aartment   2 story [place] Two level   bed and bath downstairs   Bathroom Astronomer      Prior Function   Level of Independence Independent    Vocation Retired    Leisure Travel; [employer], Chemical engineer, Company secretary, puzzles, senior center      IADL   Prior Level of Function Shopping independent    Shopping Completely unable to shop    Prior Level of Function Light Housekeeping independent    Light Housekeeping Does not participate in any housekeeping tasks;Needs help with all home maintenance tasks    Prior Level of Function Meal Prep independent    Meal Prep Able to complete simple warm meal prep;Prepares adequate meal if supplied with ingredients   prep and kitchen mobility challenging   Prior Level of Function Community Mobility independent    Community Mobility  Relies on family or friends for transportation    Prior Level of Function Medication Managment independent    Medication Management Is responsible for taking medication in correct dosages at correct time    Prior Level of Function Financial Management husband handled prior      Mobility   Mobility Status Comments ambulates with rollator      Written Expression   Dominant Hand Right    Handwriting 50% legible   of prior level of legibility     Vision - History   Baseline Vision Wears glasses all the time    Additional Comments pt denies any changes in vision since stroke      Cognition   Attention Sustained;Alternating    Sustained Attention Appears intact    Alternating Attention Appears intact    Cognition Comments pt denies any changes in cognition      Observation/Other Assessments   Focus on Therapeutic Outcomes (FOTO)  did not capture at eval - will capture at first visit      Sensation   Light Touch Appears Intact      Coordination   9 Hole Peg Test Right;Left    Right 9 Hole Peg Test 30s    Left 9 Hole Peg Test 20.10s      ROM / Strength   AROM / PROM / Strength AROM;Strength      AROM   Overall AROM  Within functional limits for tasks performed      Strength   Overall Strength Within functional limits for tasks performed    Overall Strength Comments grossly 4/5 on RUE      Hand Function   Right Hand Gross Grasp Functional    Right Hand Grip (lbs) 28.6lbs    Left Hand Gross Grasp Functional    Left Hand Grip (lbs) 35.7lbs                           OT Education - 10/06/20 1654    Education Details Education provided on role and purpose of OT    Person(s) Educated Patient    Methods Explanation    Comprehension Verbalized understanding            OT Short Term Goals - 10/06/20 1733      OT SHORT TERM GOAL #1   Title Pt will be independent with HEP or coordination and grip strength in RUE  Time 3    Period Weeks    Status New     Target Date 10/27/20      OT SHORT TERM GOAL #2   Title Pt will write a sentence with 90% legibility with use of adapted strategies and/or equipment PRN.    Baseline 50% of prior level of handwriting    Time 3    Period Weeks    Status New      OT SHORT TERM GOAL #3   Title Pt will increased fine motor coordination in RUE by completing 9 hole peg test in 27 seconds or less.    Baseline RUE 30, LUE 20.1    Time 3    Period Weeks    Status New      OT SHORT TERM GOAL #4   Title Pt will safely navigate in kitchen with mod I with no LOB and with good walker management.    Time 3    Period Weeks    Status New             OT Long Term Goals - 10/06/20 1733      OT LONG TERM GOAL #1   Title Pt will complete FOTO at discharge.    Time 8    Period Weeks    Status New    Target Date 12/01/20   8 weeks but anticipate 12 visits     OT LONG TERM GOAL #2   Title Pt will be independent with any updated HEPs    Time 8    Period Weeks    Status New      OT LONG TERM GOAL #3   Title Pt will improve grip strength in RUE by 5 lbs or more in order to increase strength in dominant hand.    Baseline R 28.6, L 35.7    Time 8    Period Weeks    Status New      OT LONG TERM GOAL #4   Title Pt will improve fine motor coordination in RUE by 5 seconds on 9 hole peg test    Baseline R 30s L 20.1    Time 8    Period Weeks    Status New      OT LONG TERM GOAL #5   Title Pt will plan and execute a simple meal for she and her spouse with min assistance from spouse.    Time 8    Period Weeks    Status New                 Plan - 10/06/20 1734    Clinical Impression Statement P is at 75 year old that presents to Neuro OPOT s/p CVA with past medical history present for Migraines, HLD, DM, GERD, Lactose Intolerance, Kidney Stones, Diverticulitis, Anemia, Incontinence. Pt presents with decreased fine motor coordination and grip strength, unsteadiness on feet and decreased  independence with ADLs and IADLs. Skilled occupational therapy is recommended to target listed areas of deficit and increase independence with ADLs and IADLs.    OT Occupational Profile and History Problem Focused Assessment - Including review of records relating to presenting problem    Occupational performance deficits (Please refer to evaluation for details): IADL's;ADL's;Leisure    Body Structure / Function / Physical Skills ADL;FMC;Coordination;Flexibility;IADL;ROM;UE functional use;Pain;Dexterity;Strength;Decreased knowledge of use of DME    Cognitive Skills Problem Solve    Rehab Potential Good    Clinical Decision Making Limited treatment options, no task modification  necessary    Comorbidities Affecting Occupational Performance: None    Modification or Assistance to Complete Evaluation  No modification of tasks or assist necessary to complete eval    OT Frequency 2x / week    OT Duration 8 weeks   12 visits over 8 weeks to account for any missed visits or scheduling difficulties.   OT Treatment/Interventions Self-care/ADL training;Functional Furniture conservator/restorer;Therapeutic exercise;Cognitive remediation/compensation;Patient/family education;Energy conservation;Neuromuscular education;DME and/or AE instruction;Therapeutic activities;Moist Heat    Plan Coordination HEP, grip strengthening HEP (theraputty)    Consulted and Agree with Plan of Care Patient           Patient will benefit from skilled therapeutic intervention in order to improve the following deficits and impairments:   Body Structure / Function / Physical Skills: ADL,FMC,Coordination,Flexibility,IADL,ROM,UE functional use,Pain,Dexterity,Strength,Decreased knowledge of use of DME Cognitive Skills: Problem Solve     Visit Diagnosis: Muscle weakness (generalized)  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 Dysesthesia 09/21/2020  . Hemiparesis due to recent stroke ([place]) 08/30/2020  . Right sided weakness 08/29/2020  . Pain in lateral portion of right ankle 07/20/2020  . Plantar fasciitis of right foot 05/19/2020  . Small vessel disease, cerebrovascular   . Pes anserine bursitis 04/09/2019  . Skin rash 04/09/2019  . Red skin 02/25/2019  . Overactive bladder 10/24/2018  . Amnesia memory loss 05/19/2017  . Overweight (BMI 25.0-29.9) 05/09/2017  . Lumbar herniated disc 02/01/2017  . Left shoulder pain 09/30/2016  . Medicare annual wellness visit, subsequent 05/03/2016  . 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 History of kidney stones   . Cervical pain (neck) 12/04/2015  . Health maintenance examination 04/29/2014  . Paresthesia 04/29/2014  . Advanced care planning/counseling discussion 04/29/2014  . PFD (pelvic floor dysfunction) 04/29/2014  . Controlled diabetes mellitus type 2 with complications ([place]) 78/67/5449  . 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  . Hx of diverticulitis of colon 11/07/2012  . Allergic rhinitis 11/07/2012  . Hyperlipidemia associated with type 2 diabetes mellitus ([place]) 11/07/2012  . Migraine 11/07/2012  . Hereditary and idiopathic peripheral neuropathy 11/07/2012  . LGSIL Pap smear of vaginal cuff 11/07/2012  . GERD (gastroesophageal reflux disease) 11/07/2012  . Family history of premature coronary heart disease 11/07/2012    Zachery Conch MOT, OTR/L  10/06/2020, 6:44 PM  [facility] [address] [place], [place], [address] Phone: [phone]   Fax:  [phone]  Name: LAVERGNE HILTUNEN MRN: [id number] Date of Birth: 1945/07/26

## 2020-10-06 NOTE — Progress Notes (Signed)
Patient ID: Dana [name], female    DOB: August 18, 1945, 75 y.o.   MRN: [id number]  This visit was conducted in person.  BP 120/70   Pulse 67   Temp 98.2 F (36.8 C) (Temporal)   Ht 5' 2.5" (1.588 m)   Wt 166 lb (75.3 kg)   SpO2 98%   BMI 29.88 kg/m    CC: CPE/AMW Subjective:   HPI: Dana [name] is a 75 y.o. female presenting on 10/06/2020 for Medicare Wellness (Pt accompanied by husband, [name]- temp 98.1.h)   Did not see health advisor this year.    Hearing Screening   _0  _1  _2  _3  _4  _5  _6  _7  _8   Right ear:   _9 Left ear:   40 _10 Vision Screening Comments: Last eye exam, 05/2020.  [facility] Visit from 10/06/2020 in [place] [facility] [place]  PHQ-2 Total Score 2      Fall Risk  10/06/2020 09/20/2019 09/19/2018 07/25/2017 05/03/2017  Falls in the past year? 0 0 0 No No  Number falls in past yr: - 0 - - -  Injury with Fall? 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 Returned to ER over this past weekend with L upper and lower sided paresthesias. Code stroke called, reassuring CT scan, but MRI showed increase in subacute L thalamic capsular infarct. plavix DAPT restarted. Planned outpt neuro f/u (scheduled for 11/02/2020 - planning to see if this can be moved foward).   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   Preventative: COLONOSCOPY 11/2005;diverticulosis o/w WNL (Mikel Cella. Cologuard WNL 2017, 2021 Well woman examwith GYN Dr CRogue Bussing Sees every q270yr last 10/2018. Normal pap smears. Upcoming visit planned later this summer.  Breast cancer screening -3D mammogram12/2020at [facility]  - states she's had one since - will request records. Lung cancer screening -not eligible  DEXA 2015 spine -0.4, hip -1.0 WNL  Flu shot -yearly COVID vaccine moderna 05/2019, 06/2019, Moderna booster 03/2020, 08/2020 Td unsure Pneumovax 2012, prevnar 2015, pneumovax2017 zostavax-2010. h/o shingles x2 shingrix -02/2018, 05/2018 Advanced directive discussion - durable POA is husband MaLinton Rump no HCPOA set up. Has living will. Full code. Does not want prolonged life support by extraordinary means. Scanned into chart 09/2016. Seat belt use discussed  Sunscreen use and skin screen discussed Non smoker  Alcohol - rare Dentist - Q6 mo  Eye exam yearly  Bowels - no constipation Bladder - no incontinence  Patient is married and lives at home with her husband (MJerrye Beavers Patient is retired. EdXLK:GMWNUUVOZDducation.  Right handed.  Exercise 3 times a week.  Diet: good water, fruits/vegetables daily- joined weight watchers but no longer. Caffeine:None      Relevant past medical, surgical, family and social history reviewed and updated as indicated. Interim medical history since our last visit reviewed. Allergies and medications reviewed and updated. Outpatient Medications Prior to Visit  Medication Sig Dispense Refill  . acetaminophen (TYLENOL) 325 MG tablet Take 1 tablet (325 mg total) by mouth every 6 (six) hours as needed for mild pain.    .Marland Kitchen  Alcohol Swabs (B-D SINGLE USE SWABS REGULAR) PADS Check blood sugar once daily 100 each 0  . aspirin 81 MG tablet Take 81 mg by mouth daily.    Marland Kitchen atorvastatin (LIPITOR) 40 MG tablet TAKE 1 TABLET (40 MG TOTAL) BY MOUTH DAILY. (Patient taking differently: Take 40 mg by mouth at bedtime.) 90 tablet 0  . Blood Glucose Calibration (TRUE METRIX LEVEL 1) Low SOLN Use to check meter 1 each 0  . Blood Glucose Monitoring Suppl (TRUE METRIX METER) w/Device KIT Check blood sugar once daily 1 kit 0  . butalbital-acetaminophen-caffeine (FIORICET) 50-325-40  MG tablet Take 1 tablet by mouth every 6 (six) hours as needed for headache. 10 tablet 1  . clopidogrel (PLAVIX) 75 MG tablet Take 1 tablet (75 mg total) by mouth daily. 30 tablet 0  . co-enzyme Q-10 30 MG capsule Take 30 mg by mouth at bedtime.    . fluticasone (FLONASE) 50 MCG/ACT nasal spray USE 2 SPRAYS IN EACH NOSTRIL EVERY DAY (Patient taking differently: Place 2 sprays into both nostrils at bedtime.) 48 g 0  . gabapentin (NEURONTIN) 100 MG capsule Take 1-2 capsules (100-200 mg total) by mouth at bedtime as needed. (Patient taking differently: Take 100-200 mg by mouth at bedtime.) 60 capsule 1  . glucose blood (TRUE METRIX BLOOD GLUCOSE TEST) test strip Check blood sugar once daily 100 each 3  . lactase (LACTAID) 3000 units tablet Take 2 tablets (6,000 Units total) by mouth 3 (three) times daily with meals. (Patient taking differently: Take 6,000 Units by mouth See admin instructions. Take 6,000 units (2 tablets) by mouth up to three times a day WITH DAIRY PRODUCTS)    . methocarbamol (ROBAXIN) 500 MG tablet TAKE 1 TABLET (500 MG TOTAL) BY MOUTH 3 (THREE) TIMES DAILY AS NEEDED FOR MUSCLE SPASMS (SEDATION PRECAUTIONS). (Patient taking differently: Take 500 mg by mouth 3 (three) times daily as needed for muscle spasms (or migraines- sedation precautions).) 60 tablet 1  . Multiple Vitamin (MULTIVITAMIN) tablet Take 1 tablet by mouth at bedtime.    Marland Kitchen omeprazole (PRILOSEC) 20 MG capsule Take 1 capsule (20 mg total) by mouth daily. (Patient taking differently: Take 20 mg by mouth daily before breakfast.) 90 capsule 3  . ondansetron (ZOFRAN ODT) 4 MG disintegrating tablet Take 1 tablet (4 mg total) by mouth every 8 (eight) hours as needed. (Patient taking differently: Take 4 mg by mouth every 8 (eight) hours as needed for vomiting or nausea (dissolve orally).) 20 tablet 3  . THERATEARS PF 0.25 % SOLN Place 2 drops into both eyes in the morning.    . topiramate (TOPAMAX) 25 MG tablet Take 3 tablets (75 mg  total) by mouth at bedtime. (Patient taking differently: Take 25-50 mg by mouth See admin instructions. Take 25 mg by mouth in the morning and 50 mg at bedtime) 270 tablet 4  . triamcinolone cream (KENALOG) 0.1 % APPLY TO AFFECTED AREA TWICE A DAY (Patient taking differently: Apply 1 application topically 2 (two) times daily as needed (to affected areas under the breasts- for irritation).) 80 g 1  . TRUEplus Lancets 33G MISC Check blood sugar once daily 100 each 3  . vitamin B-12 (CYANOCOBALAMIN) 1000 MCG tablet Take 1 tablet (1,000 mcg total) by mouth every Monday, Wednesday, and Friday.    Marland Kitchen ibuprofen (ADVIL) 600 MG tablet Take 600 mg by mouth in the morning. (Patient not taking: Reported on 10/02/2020)     No facility-administered medications prior to visit.  Per HPI unless specifically indicated in ROS section below Review of Systems  Constitutional: Positive for appetite change (decreased). Negative for activity change, chills, fatigue, fever and unexpected weight change.  HENT: Negative for hearing loss.   Eyes: Negative for visual disturbance.  Respiratory: Negative for cough, chest tightness, shortness of breath and wheezing.   Cardiovascular: Negative for chest pain, palpitations and leg swelling.  Gastrointestinal: Negative for abdominal distention, abdominal pain, blood in stool, constipation, diarrhea, nausea and vomiting.  Genitourinary: Negative for difficulty urinating and hematuria.  Musculoskeletal: Negative for arthralgias, myalgias and neck pain.  Skin: Negative for rash.  Neurological: Negative for dizziness, seizures, syncope and headaches.  Hematological: Negative for adenopathy. Does not bruise/bleed easily.  Psychiatric/Behavioral: Negative for dysphoric mood. The patient is not nervous/anxious.    Objective:  BP 120/70   Pulse 67   Temp 98.2 F (36.8 C) (Temporal)   Ht 5' 2.5" (1.588 m)   Wt 166 lb (75.3 kg)   SpO2 98%   BMI 29.88 kg/m   Wt Readings from  Last 3 Encounters:  10/06/20 166 lb (75.3 kg)  10/02/20 165 lb (74.8 kg)  09/21/20 167 lb 3 oz (75.8 kg)      Physical Exam Vitals and nursing note reviewed.  Constitutional:      General: She is not in acute distress.    Appearance: Normal appearance. She is well-developed. She is not ill-appearing.     Comments: Ambulates with rolling walker  HENT:     Head: Normocephalic and atraumatic.     Right Ear: Hearing, tympanic membrane, ear canal and external ear normal.     Left Ear: Hearing, tympanic membrane, ear canal and external ear normal.  Eyes:     General: No scleral icterus.    Extraocular Movements: Extraocular movements intact.     Conjunctiva/sclera: Conjunctivae normal.     Pupils: Pupils are equal, round, and reactive to light.  Neck:     Thyroid: No thyroid mass or thyromegaly.     Vascular: No carotid bruit.  Cardiovascular:     Rate and Rhythm: Normal rate and regular rhythm.     Pulses: Normal pulses.          Radial pulses are 2+ on the right side and 2+ on the left side.     Heart sounds: Normal heart sounds. No murmur heard.   Pulmonary:     Effort: Pulmonary effort is normal. No respiratory distress.     Breath sounds: No wheezing, rhonchi or rales.  Abdominal:     General: Bowel sounds are normal. There is no distension.     Palpations: Abdomen is soft. There is no mass.     Tenderness: There is no abdominal tenderness. There is no guarding or rebound.     Hernia: No hernia is present.  Musculoskeletal:        General: Normal range of motion.     Cervical back: Normal range of motion and neck supple.     Right lower leg: No edema.     Left lower leg: No edema.  Lymphadenopathy:     Cervical: No cervical adenopathy.  Skin:    General: Skin is warm and dry.     Findings: No rash.  Neurological:     General: No focal deficit present.     Mental Status: She is alert and oriented to person, place, and time.     Comments:  CN grossly intact, station  and gait intact Recall 2/3,  3/3 with cue Calculation 5/5 DLROW  Psychiatric:        Mood and Affect: Mood normal.        Behavior: Behavior normal.        Thought Content: Thought content normal.        Judgment: Judgment normal.       Results for orders placed or performed during the hospital encounter of 10/02/20  Resp Panel by RT-PCR (Flu A&B, Covid) Nasopharyngeal Swab   Specimen: Nasopharyngeal Swab; Nasopharyngeal(NP) swabs in vial transport medium  Result Value Ref Range   SARS Coronavirus 2 by RT PCR NEGATIVE NEGATIVE   Influenza A by PCR NEGATIVE NEGATIVE   Influenza B by PCR NEGATIVE NEGATIVE  Protime-INR  Result Value Ref Range   Prothrombin Time 13.4 11.4 - 15.2 seconds   INR 1.0 0.8 - 1.2  APTT  Result Value Ref Range   aPTT 29 24 - 36 seconds  CBC  Result Value Ref Range   WBC 4.2 4.0 - 10.5 K/uL   RBC 4.19 3.87 - 5.11 MIL/uL   Hemoglobin 12.7 12.0 - 15.0 g/dL   HCT 40.0 36.0 - 46.0 %   MCV 95.5 80.0 - 100.0 fL   MCH 30.3 26.0 - 34.0 pg   MCHC 31.8 30.0 - 36.0 g/dL   RDW 13.2 11.5 - 15.5 %   Platelets 228 150 - 400 K/uL   nRBC 0.0 0.0 - 0.2 %  Differential  Result Value Ref Range   Neutrophils Relative % 48 %   Neutro Abs 2.0 1.7 - 7.7 K/uL   Lymphocytes Relative 36 %   Lymphs Abs 1.5 0.7 - 4.0 K/uL   Monocytes Relative 13 %   Monocytes Absolute 0.6 0.1 - 1.0 K/uL   Eosinophils Relative 1 %   Eosinophils Absolute 0.0 0.0 - 0.5 K/uL   Basophils Relative 1 %   Basophils Absolute 0.0 0.0 - 0.1 K/uL   Immature Granulocytes 1 %   Abs Immature Granulocytes 0.04 0.00 - 0.07 K/uL  Comprehensive metabolic panel  Result Value Ref Range   Sodium 143 135 - 145 mmol/L   Potassium 4.0 3.5 - 5.1 mmol/L   Chloride 112 (H) 98 - 111 mmol/L   CO2 25 22 - 32 mmol/L   Glucose, Bld 122 (H) 70 - 99 mg/dL   BUN 12 8 - 23 mg/dL   Creatinine, Ser 0.74 0.44 - 1.00 mg/dL   Calcium 9.4 8.9 - 10.3 mg/dL   Total Protein 6.9 6.5 - 8.1 g/dL   Albumin 3.9 3.5 - 5.0 g/dL    AST 21 15 - 41 U/L   ALT 30 0 - 44 U/L   Alkaline Phosphatase 71 38 - 126 U/L   Total Bilirubin 0.7 0.3 - 1.2 mg/dL   GFR, Estimated >60 >60 mL/min   Anion gap 6 5 - 15  Urine rapid drug screen (hosp performed)  Result Value Ref Range   Opiates NONE DETECTED NONE DETECTED   Cocaine NONE DETECTED NONE DETECTED   Benzodiazepines NONE DETECTED NONE DETECTED   Amphetamines NONE DETECTED NONE DETECTED   Tetrahydrocannabinol NONE DETECTED NONE DETECTED   Barbiturates NONE DETECTED NONE DETECTED  Urinalysis, Routine w reflex microscopic  Result Value Ref Range   Color, Urine COLORLESS (A) YELLOW   APPearance CLEAR CLEAR   Specific Gravity, Urine 1.005 1.005 - 1.030   pH 8.0 5.0 - 8.0   Glucose, UA NEGATIVE NEGATIVE mg/dL   Hgb urine dipstick NEGATIVE NEGATIVE   Bilirubin  Urine NEGATIVE NEGATIVE   Ketones, ur NEGATIVE NEGATIVE mg/dL   Protein, ur NEGATIVE NEGATIVE mg/dL   Nitrite NEGATIVE NEGATIVE   Leukocytes,Ua NEGATIVE NEGATIVE  CBG monitoring, ED  Result Value Ref Range   Glucose-Capillary 117 (H) 70 - 99 mg/dL  I-stat chem 8, ED  Result Value Ref Range   Sodium 145 135 - 145 mmol/L   Potassium 4.0 3.5 - 5.1 mmol/L   Chloride 112 (H) 98 - 111 mmol/L   BUN 13 8 - 23 mg/dL   Creatinine, Ser 0.60 0.44 - 1.00 mg/dL   Glucose, Bld 118 (H) 70 - 99 mg/dL   Calcium, Ion 1.25 1.15 - 1.40 mmol/L   TCO2 23 22 - 32 mmol/L   Hemoglobin 12.6 12.0 - 15.0 g/dL   HCT 37.0 36.0 - 46.0 %   Assessment & Plan:  This visit occurred during the SARS-CoV-2 public health emergency.  Safety protocols were in place, including screening questions prior to the visit, additional usage of staff PPE, and extensive cleaning of exam room while observing appropriate contact time as indicated for disinfecting solutions.   Problem List Items Addressed This Visit    Hyperlipidemia associated with type 2 diabetes mellitus ([place])    LDL of 84 above goal 70 No changes made however as she's already experiencing  myalgias on atorvastatin 67m daily.  Consider adding zetia.  The ASCVD Risk score (Mikey BussingDC Jr., et al., 2013) failed to calculate for the following reasons:   The patient has a prior MI or stroke diagnosis       Migraine    H/o complex migraines on topamax.       LGSIL Pap smear of vaginal cuff    Sees GYN Q2 yrs.       GERD (gastroesophageal reflux disease)    Chronic, stable on daily omeprazole 254m      Controlled diabetes mellitus type 2 with complications (HCC)    Chronic, remains diet controlled.       Health maintenance examination    Preventative protocols reviewed and updated unless pt declined. Discussed healthy diet and lifestyle.       Medicare annual wellness visit, subsequent - Primary    I have personally reviewed the Medicare Annual Wellness questionnaire and have noted 1. The patient's medical and social history 2. Their use of alcohol, tobacco or illicit drugs 3. Their current medications and supplements 4. The patient's functional ability including ADL's, fall risks, home safety risks and hearing or visual impairment. Cognitive function has been assessed and addressed as indicated.  5. Diet and physical activity 6. Evidence for depression or mood disorders The patients weight, height, BMI have been recorded in the chart. I have made referrals, counseling and provided education to the patient based on review of the above and I have provided the pt with a written personalized care plan for preventive services. Provider list updated.. See scanned questionairre as needed for further documentation. Reviewed preventative protocols and updated unless pt declined.       [facility] to [facility]   Recent ER visit for L sided paresthesias, imaging showed progression of thalamic capsular infarct, DAPT restarted. She will call to see if she can advance neuro f/u visit.       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          No orders of the defined types were placed in this encounter.  No orders of the defined types were placed in this encounter.   Patient instructions: We will request latest mammogram from [facility].  Good to see you today Return as needed or in 6 months for follow up visit.   Follow up plan: Return in about 6 months (around 04/07/2021) for follow up visit.  [name], MD

## 2020-10-07 NOTE — Assessment & Plan Note (Signed)
Sees GYN Q2 yrs.

## 2020-10-07 NOTE — Assessment & Plan Note (Signed)
Chronic, stable on daily omeprazole 20mg 

## 2020-10-07 NOTE — Assessment & Plan Note (Signed)
Chronic, remains diet controlled.

## 2020-10-07 NOTE — Assessment & Plan Note (Addendum)
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

## 2020-10-07 NOTE — Assessment & Plan Note (Addendum)
Recent ER visit for L sided paresthesias, imaging showed progression of thalamic capsular infarct, DAPT restarted. She will call to see if she can advance neuro f/u visit.

## 2020-10-07 NOTE — Assessment & Plan Note (Signed)
LDL of 84 above goal 70 No changes made however as she's already experiencing myalgias on atorvastatin 40mg  daily.  Consider adding zetia.  The ASCVD Risk score [name], et al., 2013) failed to calculate for the following reasons:   The patient has a prior MI or stroke diagnosis

## 2020-10-07 NOTE — Assessment & Plan Note (Signed)
H/o complex migraines on topamax.

## 2020-10-08 ENCOUNTER — Ambulatory Visit: Payer: Medicare HMO | Attending: Family Medicine

## 2020-10-08 ENCOUNTER — Encounter: Payer: Self-pay | Admitting: Occupational Therapy

## 2020-10-08 ENCOUNTER — Other Ambulatory Visit: Payer: Self-pay

## 2020-10-08 ENCOUNTER — Ambulatory Visit: Payer: Medicare HMO | Admitting: Occupational Therapy

## 2020-10-08 DIAGNOSIS — M6281 Muscle weakness (generalized): Secondary | ICD-10-CM | POA: Insufficient documentation

## 2020-10-08 DIAGNOSIS — I69851 Hemiplegia and hemiparesis following other cerebrovascular disease affecting right dominant side: Secondary | ICD-10-CM | POA: Diagnosis not present

## 2020-10-08 DIAGNOSIS — R2681 Unsteadiness on feet: Secondary | ICD-10-CM

## 2020-10-08 DIAGNOSIS — R262 Difficulty in walking, not elsewhere classified: Secondary | ICD-10-CM

## 2020-10-08 DIAGNOSIS — R2689 Other abnormalities of gait and mobility: Secondary | ICD-10-CM | POA: Diagnosis not present

## 2020-10-08 DIAGNOSIS — R278 Other lack of coordination: Secondary | ICD-10-CM

## 2020-10-08 NOTE — Patient Instructions (Signed)
  Coordination Activities  Perform the following activities for 20 minutes 2 times per day with right hand(s).   Rotate ball in fingertips (clockwise and counter-clockwise).  Toss ball between hands.  Toss ball in air and catch with the same hand.  Flip cards 1 at a time as fast as you can.  Deal cards with your thumb (Hold deck in hand and push card off top with thumb).  Rotate card in hand (clockwise and counter-clockwise).  Pick up coins, buttons, marbles, dried beans/pasta of different sizes and place in container.  Pick up coins and place in container or coin bank.  Pick up coins and stack.  Pick up coins one at a time until you get 5-10 in your hand, then move coins from palm to fingertips to stack one at a time.  Practice writing and/or typing.  Screw together nuts and bolts, then unfasten.

## 2020-10-08 NOTE — Therapy (Signed)
[facility] [address] [place], [place], [address] Phone: [phone]   Fax:  [phone]  Occupational Therapy Treatment  Patient Details  Name: Dana [name] MRN: [id number] Date of Birth: 27-Feb-1946 Referring Provider (OT): [name] MD - follows up with Butler Denmark   Encounter Date: 10/08/2020   OT End of Session - 10/08/20 1533    Visit Number 2    Number of Visits 13    Date for OT Re-Evaluation 12/01/20   12 visits over 8 weeks to allow for scheduling and missed visits   Authorization Type [employer]    Authorization Time Period VL:MN $20 copay per day    Authorization - Visit Number 1    Authorization - Number of Visits 10    Progress Note Due on Visit 10    OT Start Time 1529    OT Stop Time 1615    OT Time Calculation (min) 46 min    Activity Tolerance Patient tolerated treatment well    Behavior During Therapy [facility] for tasks assessed/performed           Past Medical History:  Diagnosis Date  . Allergy   . Anemia   . Arthritis   . Cataract   . Controlled type 2 diabetes mellitus without complication, without long-term current use of insulin ([place])   . Diverticulitis   . History of chicken pox   . Hyperlipidemia   . Kidney stone   . 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 Migraine   . Urge incontinence 04/29/2014    Past Surgical History:  Procedure Laterality Date  . ABDOMINAL HYSTERECTOMY  2006   cervix remains, menorrhagia  . APPENDECTOMY  1970  . [place]   biopsy-left  . BREAST SURGERY  1995   right milk duct removed  . CARPAL TUNNEL RELEASE Right 02/2017  . CHOLECYSTECTOMY  1971  . COLONOSCOPY  11/2005   diverticulosis o/w WNL [facility])  . DEXA  2015   spine -0.4, hip -1.0 WNL  . LITHOTRIPSY    . LUMBAR EPIDURAL INJECTION Left 10/2017   transforaminal L L3/4 (Dr Maryjean Ka)  . TUBAL LIGATION      There were no vitals filed for this visit.   Subjective  Assessment - 10/08/20 1534    Subjective  Pt report doing "fine" but reporting a cramp in the L butt cheek d/t doing a lot today.    Pertinent History Migraines, HLD, DM, GERD, Lactose Intolerance, Kidney Stones, Diverticulitis, Anemia, Incontinence    Limitations no driving. fall risk.    Patient Stated Goals "a lot of trouble with fine motor skills" "writing"    Currently in Pain? Yes    Pain Score 3     Pain Location Buttocks    Pain Orientation Left    Pain Descriptors / Indicators Cramping    Pain Type Acute pain    Pain Onset Today    Pain Frequency Intermittent            Coordination HEP    Coordination Activities  Perform the following activities for 20 minutes 2 times per day with right hand(s).   Rotate ball in fingertips (clockwise and counter-clockwise).  Toss ball between hands.  Toss ball in air and catch with the same hand.  Flip cards 1 at a time as fast as you can.  Deal cards with your thumb (Hold deck in hand and push card off top with thumb).  Rotate  card in hand (clockwise and counter-clockwise).  Pick up coins, buttons, marbles, dried beans/pasta of different sizes and place in container.  Pick up coins and place in container or coin bank.  Pick up coins and stack.  Pick up coins one at a time until you get 5-10 in your hand, then move coins from palm to fingertips to stack one at a time.  Practice writing and/or typing.  Screw together nuts and bolts, then unfasten.     Handwriting/Tracing tracing with wide barrel highlighter letters (uppercase) and shapes with good control and minimal to absent deviations. Pt trialed built up pen with tan foam grip and foam grip on felt tip pen with good results. Pt with best results with felt tip pen with tan foam grip.                OT Short Term Goals - 10/08/20 1549      OT SHORT TERM GOAL #1   Title Pt will be independent with HEP or coordination and grip strength in RUE    Time 3     Period Weeks    Status On-going   issued coordination HEP 10/08/20   Target Date 10/27/20      OT SHORT TERM GOAL #2   Title Pt will write a sentence with 90% legibility with use of adapted strategies and/or equipment PRN.    Baseline 50% of prior level of handwriting    Time 3    Period Weeks    Status New      OT SHORT TERM GOAL #3   Title Pt will increased fine motor coordination in RUE by completing 9 hole peg test in 27 seconds or less.    Baseline RUE 30, LUE 20.1    Time 3    Period Weeks    Status New      OT SHORT TERM GOAL #4   Title Pt will safely navigate in kitchen with mod I with no LOB and with good walker management.    Time 3    Period Weeks    Status New             OT Long Term Goals - 10/08/20 1537      OT LONG TERM GOAL #1   Title Pt will complete FOTO at discharge and score at least 70%    Baseline 57%    Time 8    Period Weeks    Status Revised    Target Date 12/01/20      OT LONG TERM GOAL #2   Title Pt will be independent with any updated HEPs    Time 8    Period Weeks    Status New      OT LONG TERM GOAL #3   Title Pt will improve grip strength in RUE by 5 lbs or more in order to increase strength in dominant hand.    Baseline R 28.6, L 35.7    Time 8    Period Weeks    Status New      OT LONG TERM GOAL #4   Title Pt will improve fine motor coordination in RUE by 5 seconds on 9 hole peg test    Baseline R 30s L 20.1    Time 8    Period Weeks    Status New      OT LONG TERM GOAL #5   Title Pt will plan and execute a simple meal for she and her spouse  with min assistance from spouse.    Time 8    Period Weeks    Status New                 Plan - 10/08/20 1556    Clinical Impression Statement Pt is progressing towards goals set at evaluation. Pt is agreeable to the goals that were set.    OT Occupational Profile and History Problem Focused Assessment - Including review of records relating to presenting problem     Occupational performance deficits (Please refer to evaluation for details): IADL's;ADL's;Leisure    Body Structure / Function / Physical Skills ADL;FMC;Coordination;Flexibility;IADL;ROM;UE functional use;Pain;Dexterity;Strength;Decreased knowledge of use of DME    Cognitive Skills Problem Solve    Rehab Potential Good    Clinical Decision Making Limited treatment options, no task modification necessary    Comorbidities Affecting Occupational Performance: None    Modification or Assistance to Complete Evaluation  No modification of tasks or assist necessary to complete eval    OT Frequency 2x / week    OT Duration 8 weeks   12 visits over 8 weeks to account for any missed visits or scheduling difficulties.   OT Treatment/Interventions Self-care/ADL training;Functional Furniture conservator/restorer;Therapeutic exercise;Cognitive remediation/compensation;Patient/family education;Energy conservation;Neuromuscular education;DME and/or AE instruction;Therapeutic activities;Moist Heat    Plan theraputty (out of yellow last session)    Consulted and Agree with Plan of Care Patient           Patient will benefit from skilled therapeutic intervention in order to improve the following deficits and impairments:   Body Structure / Function / Physical Skills: ADL,FMC,Coordination,Flexibility,IADL,ROM,UE functional use,Pain,Dexterity,Strength,Decreased knowledge of use of DME Cognitive Skills: Problem Solve     Visit Diagnosis: Muscle weakness (generalized)  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)  Unsteadiness on feet  Other abnormalities of gait and mobility  Other lack of coordination    Problem List Patient Active Problem List   Diagnosis Date Noted  . Dysesthesia 09/21/2020  . Hemiparesis due to recent stroke ([place]) 08/30/2020  . Right sided weakness 08/29/2020  . Pain in lateral portion of right ankle 07/20/2020  . Plantar fasciitis  of right foot 05/19/2020  . Small vessel disease, cerebrovascular   . Overactive bladder 10/24/2018  . Amnesia memory loss 05/19/2017  . Overweight (BMI 25.0-29.9) 05/09/2017  . Lumbar herniated disc 02/01/2017  . Medicare annual wellness visit, subsequent 05/03/2016  . 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 History of kidney stones   . Cervical pain (neck) 12/04/2015  . Health maintenance examination 04/29/2014  . Paresthesia 04/29/2014  . Advanced care planning/counseling discussion 04/29/2014  . PFD (pelvic floor dysfunction) 04/29/2014  . Controlled diabetes mellitus type 2 with complications ([place]) 83/72/9021  . 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  . Hx of diverticulitis of colon 11/07/2012  . Allergic rhinitis 11/07/2012  . Hyperlipidemia associated with type 2 diabetes mellitus ([place]) 11/07/2012  . Migraine 11/07/2012  . Hereditary and idiopathic peripheral neuropathy 11/07/2012  . LGSIL Pap smear of vaginal cuff 11/07/2012  . GERD (gastroesophageal reflux disease) 11/07/2012  . Family history of premature coronary heart disease 11/07/2012    Zachery Conch MOT, OTR/L  10/08/2020, 4:19 PM  [facility] [address] [place], [place], [address] Phone: [phone]   Fax:  [phone]  Name: Dana [name] MRN: [id number] Date of Birth: 05-Mar-1946

## 2020-10-08 NOTE — Therapy (Signed)
[facility] [address] [place], [place], [address] Phone: [phone]   Fax:  [phone]  Physical Therapy Treatment  Patient Details  Name: Dana [name] MRN: [id number] Date of Birth: Feb 25, 1946 Referring Provider (PT): Referred by: Dana Austin, NP. PCP is Dana Bush, MD   Encounter Date: 10/08/2020   PT End of Session - 10/08/20 1619    Visit Number 5    Number of Visits 17    Date for PT Re-Evaluation 12/15/20   POC for 8 weeks, Cert for 90 days   Authorization Type [employer] (10th Visit PN)    Authorization Time Period awaiting authorization    Progress Note Due on Visit 10    PT Start Time 1617    PT Stop Time 1659    PT Time Calculation (min) 42 min    Equipment Utilized During Treatment Gait belt    Activity Tolerance Patient tolerated treatment well    Behavior During Therapy WFL for tasks assessed/performed           Past Medical History:  Diagnosis Date  . Allergy   . Anemia   . Arthritis   . Cataract   . Controlled type 2 diabetes mellitus without complication, without long-term current use of insulin ([place])   . Diverticulitis   . History of chicken pox   . Hyperlipidemia   . Kidney stone   . 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 Migraine   . Urge incontinence 04/29/2014    Past Surgical History:  Procedure Laterality Date  . ABDOMINAL HYSTERECTOMY  2006   cervix remains, menorrhagia  . APPENDECTOMY  1970  . [place]   biopsy-left  . BREAST SURGERY  1995   right milk duct removed  . CARPAL TUNNEL RELEASE Right 02/2017  . CHOLECYSTECTOMY  1971  . COLONOSCOPY  11/2005   diverticulosis o/w WNL [facility])  . DEXA  2015   spine -0.4, hip -1.0 WNL  . LITHOTRIPSY    . LUMBAR EPIDURAL INJECTION Left 10/2017   transforaminal L L3/4 (Dr Maryjean Ka)  . TUBAL LIGATION      There were no vitals filed for this visit.   Subjective Assessment - 10/08/20 1619     Subjective No new changes/complaints. Patient reports mild cramp in the L buttocks area that started earlier today.    Patient is accompained by: Family member   [name])   Pertinent History Migraines, HLD, DM, GERD, Lactose Intolerance, Kidney Stones, Diverticulitis, Anemia, Incontinence    Limitations Sitting;Standing;Walking    How long can you walk comfortably? 10 minutes    Patient Stated Goals Walk without Assistance;    Currently in Pain? Yes    Pain Score 3     Pain Location Buttocks    Pain Orientation Left    Pain Descriptors / Indicators Cramping    Pain Type Acute pain    Pain Onset Today    Pain Frequency Intermittent             [facility] Adult PT Treatment/Exercise - 10/08/20 0001      Transfers   Transfers Sit to Stand;Stand to Sit    Sit to Stand 5: Supervision    Stand to Sit 5: Supervision      Ambulation/Gait   Ambulation/Gait Yes    Ambulation/Gait Assistance 5: Supervision    Ambulation/Gait Assistance Details around therapy gym with activities    Ambulation Distance (Feet) --   clinic distance  Assistive device Rollator    Gait Pattern Step-through pattern;Decreased arm swing - right;Decreased arm swing - left;Decreased step length - right;Decreased step length - left;Decreased stance time - right;Decreased hip/knee flexion - right;Decreased dorsiflexion - right;Decreased weight shift to right;Poor foot clearance - right    Ambulation Surface Level;Indoor      Exercises   Exercises Other Exercises;Knee/Hip    Other Exercises  Completed supine single knee to chest stretch with LLE, 2 x 30 seconds. Completed seated hamstring stretch on LLE, 2 x 30 seconds. Cues for proper technique. Added to HEP to reduce pain/cramping sensation      Knee/Hip Exercises: Aerobic   Nustep Completed NuStep with BLE/BUE on Level 4 x 8 minutes with cues to maintain pace >/= 45 steps per minute. Patient tolerating well.             Balance Exercises - 10/08/20 0001       Balance Exercises: Standing   Standing Eyes Opened Narrow base of support (BOS);Head turns;Limitations;Foam/compliant surface    Standing Eyes Opened Limitations completed eyes open, horizontal/vertical head turns/nods x 10 reps each direction    Standing Eyes Closed Narrow base of support (BOS);Foam/compliant surface;3 reps;30 secs;Limitations    Standing Eyes Closed Limitations increased postural sway with vision removed, 3 x 30 seconds. close CGA    Rockerboard Anterior/posterior;EO;EC;20 seconds;Intermittent UE support;Limitations;Head turns    Rockerboard Limitations standing on rockerboard A/P, with EO maintaining steady x 1 minute, then progressed to vertical head turns x 10 reps. Completed EC 3 x 20 seconds. increased postural sway noted iwth vision removed. intermittent touchA to // bars and CGA from PT.    Tandem Gait Forward;Intermittent upper extremity support;3 reps;Limitations    Tandem Gait Limitations completed tandem gait x 3 laps down and back in // bars, intermittent UE support, close supervision    Sidestepping Foam/compliant support;3 reps;Limitations    Sidestepping Limitations completed in // bars, x 3 laps down and back with intermittent UE support.          Bolded Exercises are new additions added during today's session:    Access Code: [email or web address] URL: [email or web address] Date: 10/08/2020 Prepared by: Baldomero Lamy  Exercises Supine Bridge - 1 x daily - 5 x weekly - 2 sets - 10 reps Clamshell - 1 x daily - 5 x weekly - 2 sets - 10 reps Supine March with Resistance Band - 1 x daily - 5 x weekly - 2 sets - 10 reps Sit to Stand with Arms Crossed - 1 x daily - 5 x weekly - 2 sets - 10 reps Romberg Stance with Eyes Closed - 1 x daily - 5 x weekly - 1 sets - 3 reps - 30 hold Side Stepping with Counter Support - 1 x daily - 5 x weekly - 1 sets - 4 reps Seated Hamstring Stretch - 1 x daily - 5 x weekly - 1 sets - 3 reps - 30 seconds hold Supine  Single Knee to Chest Stretch - 1 x daily - 5 x weekly - 1 sets - 3 reps - 30 seconds hold     PT Education - 10/08/20 1653    Education Details Addition of Stretches to [employer]) Educated Patient    Methods Explanation;Demonstration;Handout    Comprehension Verbalized understanding;Returned demonstration            PT Short Term Goals - 09/16/20 1740      PT SHORT TERM GOAL #1   Title Patient  will be independent with initial HEP for balance/strength (All STGs Due: 10/14/20)    Baseline no  HEP established    Time 4    Period Weeks    Status New    Target Date 10/14/20      PT SHORT TERM GOAL #2   Title Patient will improve gait speed to >/= 1.5 ft/sec with LRAD    Baseline 1.19 ft/sec w/ RW    Time 4    Period Weeks    Status New      PT SHORT TERM GOAL #3   Title Patient will improve Berg Balance to >/= 39/56 to indicate improved balance and reduced fall risk.    Baseline 34/56    Time 4    Period Weeks    Status New      PT SHORT TERM GOAL #4   Title Patient will improve TUG to </= 20 secs w/ LRAD to indicate reduced fall risk.    Baseline 23.56 secs    Time 4    Period Weeks    Status New      PT SHORT TERM GOAL #5   Title Patient will ambulate >/= 500 ft w/ LRAD and supervision for improved household mobility    Baseline 100 ft CGA    Time 4    Period Weeks    Status New             PT Long Term Goals - 09/16/20 1743      PT LONG TERM GOAL #1   Title Patient will be independent with final HEP for balance/strength (All LTGs Due: 11/11/20)    Baseline no HEP established    Time 8    Period Weeks    Status New    Target Date 11/11/20      PT LONG TERM GOAL #2   Title Patient will improve gait speed to >/= 2.0 ft/sec with LRAD    Baseline 1.19 ft/sec    Time 8    Period Weeks    Status New      PT LONG TERM GOAL #3   Title Patient will improve Berg Balance to >/= 45/56 to indicate improved balance and reduced fall risk    Baseline 34/56     Time 8    Period Weeks    Status New      PT LONG TERM GOAL #4   Title Patient will improve TUG to </= 15 secs w/ LRAD to indicate reduced fall risk    Baseline 23.56 secs    Time 8    Period Weeks    Status New      PT LONG TERM GOAL #5   Title Patient will improve 5xSTS to </= 12 secs to indicate reduced fall risk    Baseline 14.42 secs    Time 8    Period Weeks    Status New      Additional Long Term Goals   Additional Long Term Goals Yes      PT LONG TERM GOAL #6   Title Patient will demo ability to ascend/descend 12 stairs with single rail and supervision for improved ability to ambulate to second floor of home    Baseline not currently completing    Time 8    Period Weeks    Status New      PT LONG TERM GOAL #7   Title Patient will improve FOTO to >/= 65%    Baseline 49%  Time 8    Period Weeks    Status New                 Plan - 10/08/20 1653    Clinical Impression Statement Due to cramp/spasm in L hip/buttocks area, completed strethcing during session and added to HEP. Patient tolerating well. Rest of session focused on strengthening and balance, continue to demo increased challenge with vision removed and horizontal > vertical head turns.    Personal Factors and Comorbidities Comorbidity 3+    Comorbidities Migraines, HLD, DM, GERD, Lactose Intolerance, Kidney Stones, Diverticulitis, Anemia, Incontinence    Examination-Activity Limitations Bed Mobility;Reach Overhead;Sit;Locomotion Level;Stand;Transfers;Stairs    Examination-Participation Restrictions Community Activity;Driving    Stability/Clinical Decision Making Stable/Uncomplicated    Rehab Potential Good    PT Frequency 2x / week    PT Duration 8 weeks    PT Treatment/Interventions ADLs/Self Care Home Management;Electrical Stimulation;Moist Heat;Cryotherapy;DME Instruction;Gait training;Stair training;Functional mobility training;Therapeutic activities;Balance training;Therapeutic  exercise;Neuromuscular re-education;Patient/family education;Orthotic Fit/Training;Passive range of motion;Vestibular    PT Next Visit Plan Continue gait assesment with R AFO (used R Post [employer]ob[employer]). Continue high level balance, weight shift activites, RLE strengthening. May benefit from [place].    Consulted and Agree with Plan of Care Patient;Family member/caregiver    Family Member Consulted Husband           Patient will benefit from skilled therapeutic intervention in order to improve the following deficits and impairments:  Abnormal gait,Decreased activity tolerance,Decreased endurance,Decreased knowledge of use of DME,Decreased range of motion,Decreased strength,Impaired UE functional use,Difficulty walking,Decreased balance,Decreased safety awareness,Decreased coordination  Visit Diagnosis: Muscle weakness (generalized)  Unsteadiness on feet  Other abnormalities of gait and mobility  Difficulty in walking, not elsewhere classified     Problem List Patient Active Problem List   Diagnosis Date Noted  . Dysesthesia 09/21/2020  . Hemiparesis due to recent stroke ([place]) 08/30/2020  . Right sided weakness 08/29/2020  . Pain in lateral portion of right ankle 07/20/2020  . Plantar fasciitis of right foot 05/19/2020  . Small vessel disease, cerebrovascular   . Overactive bladder 10/24/2018  . Amnesia memory loss 05/19/2017  . Overweight (BMI 25.0-29.9) 05/09/2017  . Lumbar herniated disc 02/01/2017  . Medicare annual wellness visit, subsequent 05/03/2016  . 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 History of kidney stones   . Cervical pain (neck) 12/04/2015  . Health maintenance examination 04/29/2014  . Paresthesia 04/29/2014  . Advanced care planning/counseling discussion 04/29/2014  . PFD (pelvic floor dysfunction) 04/29/2014  . Controlled diabetes mellitus type 2 with complications ([place]) 81/19/1478  . 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  . Hx of diverticulitis of colon 11/07/2012   . Allergic rhinitis 11/07/2012  . Hyperlipidemia associated with type 2 diabetes mellitus ([place]) 11/07/2012  . Migraine 11/07/2012  . Hereditary and idiopathic peripheral neuropathy 11/07/2012  . LGSIL Pap smear of vaginal cuff 11/07/2012  . GERD (gastroesophageal reflux disease) 11/07/2012  . Family history of premature coronary heart disease 11/07/2012    Jones Bales, PT, DPT 10/08/2020, 4:59 PM  [facility] [address] [place], [place], [address] Phone: [phone]   Fax:  [phone]  Name: Dana [name] MRN: [id number] Date of Birth: 06/13/1945

## 2020-10-08 NOTE — Patient Instructions (Signed)
Access Code: [email or web address] URL: [email or web address] Date: 10/08/2020 Prepared by: Baldomero Lamy  Exercises Supine Bridge - 1 x daily - 5 x weekly - 2 sets - 10 reps Clamshell - 1 x daily - 5 x weekly - 2 sets - 10 reps Supine March with Resistance Band - 1 x daily - 5 x weekly - 2 sets - 10 reps Sit to Stand with Arms Crossed - 1 x daily - 5 x weekly - 2 sets - 10 reps Romberg Stance with Eyes Closed - 1 x daily - 5 x weekly - 1 sets - 3 reps - 30 hold Side Stepping with Counter Support - 1 x daily - 5 x weekly - 1 sets - 4 reps Seated Hamstring Stretch - 1 x daily - 5 x weekly - 1 sets - 3 reps - 30 seconds hold Supine Single Knee to Chest Stretch - 1 x daily - 5 x weekly - 1 sets - 3 reps - 30 seconds hold

## 2020-10-12 ENCOUNTER — Other Ambulatory Visit: Payer: Self-pay

## 2020-10-12 ENCOUNTER — Encounter: Payer: Self-pay | Admitting: Neurology

## 2020-10-12 ENCOUNTER — Ambulatory Visit: Payer: Medicare HMO | Admitting: Neurology

## 2020-10-12 VITALS — BP 120/76 | HR 69 | Ht 63.0 in | Wt 167.0 lb

## 2020-10-12 DIAGNOSIS — I679 Cerebrovascular disease, unspecified: Secondary | ICD-10-CM | POA: Diagnosis not present

## 2020-10-12 DIAGNOSIS — G43109 Migraine with aura, not intractable, without status migrainosus: Secondary | ICD-10-CM

## 2020-10-12 DIAGNOSIS — I6339 Cerebral infarction due to thrombosis of other cerebral artery: Secondary | ICD-10-CM | POA: Diagnosis not present

## 2020-10-12 NOTE — Progress Notes (Addendum)
PATIENT: Dana [name] DOB: 1946-02-26  REASON FOR VISIT: follow up HISTORY FROM: patient  HISTORY OF PRESENT ILLNESS: Today 10/12/20  HISTORY  04/25/18 YY: Bleu S Marvin is our 75 year old female accompanied by her husband, seen in request by her primary care physician Dr.Gutierrez, Garlon Hatchet for recurrent headaches.   I saw her previously in 2016 for chronic migraine headaches, last visit was on January 14, 2015.   She reported a history of migraine since 20s, her typical migraine started with visual aura, shadow coming down from one side of her visual field, lasting 5-10 minutes, then followed by a severe pounding headaches, piercing pain, movement would exacerbate her headaches, her headache usually lasts for one day.    At time of evaluation in 2016, she complains of increased frequency of headaches, in addition, she reported few episode of headache with associated confusion, language difficulty, longer lasting up to 24 hours,    She was taking Relafen as needed for headaches, which seems to work for her,   She lost to follow-up because overall her headache was under good control, only has moderate migraine headaches every 2 to 3 months, but began to experience more frequent and prolonged headaches since November 2019, in December, she suffered migraine lasting for 5 days, despite multiple home medication treatment, NSAIDs, Excedrin Migraine, Tylenol, without helping her symptoms, also failed to be relieved by emergency treatment, Toradol injection, during the intense headache, she also describes visual distortions, hallucinations, her husband face was distorted, the blade of the ceiling fan began to move by itself, she was confused, unsteady gait   Previously she was taking amitriptyline complains of confusion slow thinking with the medications,   I personally reviewed MRI of the brain in January 2019, there was no acute abnormality, generalized atrophy, moderate supratentorium small  vessel disease.   CT head in December 2019, there was no acute abnormality   Labs normal BMP, CBC, A1C 6.4   August 29 2018 SS: Topamax 100 mg every night, Fioricet, Zofran, Robaxin, Aleve, Benadryl as needed for headache.  Taking daily aspirin 81 mg.    She reports she has been doing well, was not able to tolerate Topamax 100 mg, because she felt so sleepy.  She has been taking 50 mg at bedtime.  She reports she has not had any bad migraines, will have about 1 minor headache every 1 to 2 weeks.  She describes this headache as starting behind her eyes, feels like a sinus issue.  She does report this is exacerbated by bright light, sunlight.  She tries to wear her sunglasses at all times when outside.  One time she did have to take the Fioricet, she reports it was beneficial.  She had an eye exam recently, she did get new glasses, new lenses.  Before her bad migraines she will get shadows, that is her aura.  She does also complain of some neck pain with her headache. She has not had any episodes of confusion, numbness, trouble walking, or nausea.    Update April 30, 2019 SS: She presents today for follow-up via telephone visit, couldn't connect on My Chart.  She reports overall her headaches have been doing much better, on 75 mg daily of Topamax.  Since April, she had 1 headache in October when she was in [place], could be due to high altitude, lasted 3 days.  The headache was mild to moderate, she took Fioricet with partial relief, didn't take cocktail of medications.  She indicates  her overall health has been good.  She denies any new problems or concerns.  She is wondering if she might be a good candidate for Ubrelvy.   Update October 28, 2019 SS: Here today for follow-up unaccompanied. Remains on Topamax 25 mg in the morning, 50 mg at bedtime- this dosing regimen works well for her, keeps her from daytime headache.  Has not had significant headache since last seen in December.  Has Fioricet on hand  as needed, but has not taken, if onset of headache is felt (pain behind eyes, little tings in her head), will take ibuprofen with good benefit, also helps with back pain.  Remains on aspirin 81 mg daily.  Update October 12, 2020 SS: Hospitalized in April 2022 for right-sided weakness, MRI showed acute stroke, left thalamic capsular infarct.  CTA head and neck with minimal atherosclerosis, without LVO, or significant stenosis in the head or neck, likely small vessel etiology.  Treated with dual antiplatelet therapy aspirin and Plavix for 3 weeks, then aspirin alone.  2D echo with preserved EF, no valvular disease, on telemetry monitoring no evidence of A. fib or arrhythmia.  Diabetes well controlled, A1c 6.5, not on any medication.  LDL 77 on Lipitor 40 mg. (On day of stroke tingling in right fingers, thought headache was coming on around 3 PM, took Fioricet, Robaxin, ibuprofen. Went to play cards, around 5-6 PM, right leg felt funny in kitchen, went to bed around 11 PM, 4 AM woke up to go to bathroom, right leg was numb, went to hospital. Never had headache develop after taking her cocktail).  In ER 10/02/20, with left upper and lower side tingling, MRI showed increase in subacute left thalamic capsular infarct, not consistent with her symptoms, had resolved.  Discharged on Plavix 10/02/20 for 3 more weeks.  Felt could possibly represent TIA, but could be complicated migraine. Currently just taking Plavix since ER visit.   Doing PT/OT at neuro rehab, ST sometimes trouble findings words, did 1 session, felt could be side effect from Topamax, didn't do anymore therapy. No slurred speech. Right arm deficit of trouble with fine motor skills, strength is pretty good, right leg wants to buckle. Working on balance, using rollator since stroke. No falls.   Headaches are well controlled with Topamax 75 mg at bedtime, few sharp pains in head comes and goes since stroke.   Saw PCP, in May LDL was 84.  For migraines,  report starts as shadows, can't see out of part of vision, right or left side, 30 minutes lasting, will take Fioricet, Robaxin, Ibuprofen. With Topamax shadows occur 1 every 6 months. Has had itching with sulfa.  REVIEW OF SYSTEMS: Out of a complete 14 system review of symptoms, the patient complains only of the following symptoms, and all other reviewed systems are negative.  See HPI  ALLERGIES: Allergies  Allergen Reactions   Amitriptyline Other (See Comments)    Worsened migraine and confusion   Lactose Intolerance (Gi) Nausea Only and Other (See Comments)    Abdominal pain, also   Oxybutynin Other (See Comments)    Urinary retention/hesitancy   Tape Other (See Comments)    "Band-aids make my skin red"   Tramadol Other (See Comments)    Causes confusion and dizziness   Niacin And Related Itching   Septra [Sulfamethoxazole-Trimethoprim] Itching    HOME MEDICATIONS: Outpatient Medications Prior to Visit  Medication Sig Dispense Refill   acetaminophen (TYLENOL) 325 MG tablet Take 1 tablet (325 mg total) by   mouth every 6 (six) hours as needed for mild pain.     Alcohol Swabs (B-D SINGLE USE SWABS REGULAR) PADS Check blood sugar once daily 100 each 0   aspirin 81 MG tablet Take 81 mg by mouth daily.     atorvastatin (LIPITOR) 40 MG tablet TAKE 1 TABLET (40 MG TOTAL) BY MOUTH DAILY. (Patient taking differently: Take 40 mg by mouth at bedtime.) 90 tablet 0   Blood Glucose Calibration (TRUE METRIX LEVEL 1) Low SOLN Use to check meter 1 each 0   Blood Glucose Monitoring Suppl (TRUE METRIX METER) w/Device KIT Check blood sugar once daily 1 kit 0   butalbital-acetaminophen-caffeine (FIORICET) 50-325-40 MG tablet Take 1 tablet by mouth every 6 (six) hours as needed for headache. 10 tablet 1   clopidogrel (PLAVIX) 75 MG tablet Take 1 tablet (75 mg total) by mouth daily. 30 tablet 0   co-enzyme Q-10 30 MG capsule Take 30 mg by mouth at bedtime.     fluticasone (FLONASE) 50 MCG/ACT nasal spray  USE 2 SPRAYS IN EACH NOSTRIL EVERY DAY (Patient taking differently: Place 2 sprays into both nostrils at bedtime.) 48 g 0   gabapentin (NEURONTIN) 100 MG capsule Take 1-2 capsules (100-200 mg total) by mouth at bedtime as needed. (Patient taking differently: Take 100-200 mg by mouth at bedtime.) 60 capsule 1   glucose blood (TRUE METRIX BLOOD GLUCOSE TEST) test strip Check blood sugar once daily 100 each 3   lactase (LACTAID) 3000 units tablet Take 2 tablets (6,000 Units total) by mouth 3 (three) times daily with meals. (Patient taking differently: Take 6,000 Units by mouth See admin instructions. Take 6,000 units (2 tablets) by mouth up to three times a day WITH DAIRY PRODUCTS)     methocarbamol (ROBAXIN) 500 MG tablet TAKE 1 TABLET (500 MG TOTAL) BY MOUTH 3 (THREE) TIMES DAILY AS NEEDED FOR MUSCLE SPASMS (SEDATION PRECAUTIONS). (Patient taking differently: Take 500 mg by mouth 3 (three) times daily as needed for muscle spasms (or migraines- sedation precautions).) 60 tablet 1   Multiple Vitamin (MULTIVITAMIN) tablet Take 1 tablet by mouth at bedtime.     omeprazole (PRILOSEC) 20 MG capsule Take 1 capsule (20 mg total) by mouth daily. (Patient taking differently: Take 20 mg by mouth daily before breakfast.) 90 capsule 3   ondansetron (ZOFRAN ODT) 4 MG disintegrating tablet Take 1 tablet (4 mg total) by mouth every 8 (eight) hours as needed. (Patient taking differently: Take 4 mg by mouth every 8 (eight) hours as needed for vomiting or nausea (dissolve orally).) 20 tablet 3   THERATEARS PF 0.25 % SOLN Place 2 drops into both eyes in the morning.     topiramate (TOPAMAX) 25 MG tablet Take 3 tablets (75 mg total) by mouth at bedtime. (Patient taking differently: Take 25-50 mg by mouth See admin instructions. Take 25 mg by mouth in the morning and 50 mg at bedtime) 270 tablet 4   triamcinolone cream (KENALOG) 0.1 % APPLY TO AFFECTED AREA TWICE A DAY (Patient taking differently: Apply 1 application topically 2  (two) times daily as needed (to affected areas under the breasts- for irritation).) 80 g 1   TRUEplus Lancets 33G MISC Check blood sugar once daily 100 each 3   vitamin B-12 (CYANOCOBALAMIN) 1000 MCG tablet Take 1 tablet (1,000 mcg total) by mouth every Monday, Wednesday, and Friday.     No facility-administered medications prior to visit.    PAST MEDICAL HISTORY: Past Medical History:  Diagnosis Date  Allergy    Anemia    Arthritis    Cataract    Controlled type 2 diabetes mellitus without complication, without long-term current use of insulin ([place])    Diverticulitis    History of chicken pox    Hyperlipidemia    Kidney stone    Lactose intolerance in adult    Migraine    Urge incontinence 04/29/2014    PAST SURGICAL HISTORY: Past Surgical History:  Procedure Laterality Date   ABDOMINAL HYSTERECTOMY  2006   cervix remains, menorrhagia   [place]   right milk duct removed   CARPAL TUNNEL RELEASE Right 02/2017   CHOLECYSTECTOMY  1971   COLONOSCOPY  11/2005   diverticulosis o/w WNL Mikel Cella)   DEXA  2015   spine -0.4, hip -1.0 WNL   LITHOTRIPSY     LUMBAR EPIDURAL INJECTION Left 10/2017   transforaminal L L3/4 (Dr Maryjean Ka)   TUBAL LIGATION      FAMILY HISTORY: Family History  Problem Relation Age of Onset   Heart disease Mother 50   Hyperlipidemia Mother    Hyperlipidemia Father    Stroke Father    Heart disease Father    Diabetes Father    Fibromyalgia Sister    Hyperlipidemia Sister    Heart disease Brother    Diabetes Brother     SOCIAL HISTORY: Social History   Socioeconomic History   Marital status: Married    Spouse name: [name]   Number of children: 1   Years of education: 12   Highest education level: Not on file  Occupational History   Occupation: Retired    Comment: Retired  Tobacco Use   Smoking status: Never Smoker   Smokeless tobacco: Never Used  Brewing technologist Use: Never used  Substance and Sexual Activity   Alcohol use: No    Alcohol/week: 0.0 standard drinks   Drug use: No   Sexual activity: Not Currently  Other Topics Concern   Not on file  Social History Narrative   Patient is married and lives at home with her husband [name]). Patient is retired.    Edu: high school education.   Right handed.   Exercise 3 times a week.   Diet: good water, fruits/vegetables daily   Caffeine: None      HAs living will, HCPOA, full code   Social Determinants of Health   Financial Resource Strain: Not on file  Food Insecurity: Not on file  Transportation Needs: Not on file  Physical Activity: Not on file  Stress: Not on file  Social Connections: Not on file  Intimate Partner Violence: Not on file   PHYSICAL EXAM  Vitals:   10/12/20 0850  BP: 120/76  Pulse: 69  Weight: 167 lb (75.8 kg)  Height: 5' 3" (1.6 m)   Body mass index is 29.58 kg/m.  Generalized: Well developed, in no acute distress  Neurological examination  Mentation: Alert oriented to time, place, history taking. Follows all commands speech and language fluent Cranial nerve II-XII: Pupils were equal round reactive to light. Extraocular movements were full, visual field were full on confrontational test. Facial sensation and strength were normal. Head turning and shoulder shrug were normal and symmetric. Motor: The motor testing reveals 5 over 5 strength of all 4 extremities. Good symmetric motor tone is noted throughout. No right sided weakness noted. Sensory: Sensory testing is intact to soft  touch on all 4 extremities. No evidence of extinction is noted. Sensory exam is normal, subjective paresthesia of tingling to right upper extremity.  Coordination: Cerebellar testing reveals good finger-nose-finger and heel-to-shin bilaterally.  Gait and station: Gait is wide based, cautious, using rollator Reflexes: Deep tendon reflexes are symmetric and normal bilaterally.    DIAGNOSTIC DATA (LABS, IMAGING, TESTING) - I reviewed patient records, labs, notes, testing and imaging myself where available.  Lab Results  Component Value Date   WBC 4.2 10/02/2020   HGB 12.6 10/02/2020   HCT 37.0 10/02/2020   MCV 95.5 10/02/2020   PLT 228 10/02/2020      Component Value Date/Time   NA 145 10/02/2020 1047   K 4.0 10/02/2020 1047   CL 112 (H) 10/02/2020 1047   CO2 25 10/02/2020 1017   GLUCOSE 118 (H) 10/02/2020 1047   BUN 13 10/02/2020 1047   CREATININE 0.60 10/02/2020 1047   CALCIUM 9.4 10/02/2020 1017   PROT 6.9 10/02/2020 1017   ALBUMIN 3.9 10/02/2020 1017   AST 21 10/02/2020 1017   ALT 30 10/02/2020 1017   ALKPHOS 71 10/02/2020 1017   BILITOT 0.7 10/02/2020 1017   GFRNONAA >60 10/02/2020 1017   GFRAA >60 04/22/2018 1325   Lab Results  Component Value Date   CHOL 149 08/30/2020   HDL 52 08/30/2020   LDLCALC 77 08/30/2020   LDLDIRECT 84.0 09/21/2020   TRIG 101 08/30/2020   CHOLHDL 2.9 08/30/2020   Lab Results  Component Value Date   HGBA1C 6.5 (H) 08/30/2020   Lab Results  Component Value Date   VITAMINB12 692 09/21/2020   Lab Results  Component Value Date   TSH 1.97 09/20/2018   ASSESSMENT AND PLAN 74 y.o. year old female  has a past medical history of Allergy, Anemia, Arthritis, Cataract, Controlled type 2 diabetes mellitus without complication, without long-term current use of insulin ([facility]), Diverticulitis, History of chicken pox, Hyperlipidemia, Kidney stone, Lactose intolerance in adult, Migraine, and Urge incontinence (04/29/2014). here with:  1.  Acute left thalamic capsular infarct 08/29/2020, presented with right-sided weakness 2. Possible TIA vs complicated migraine 10/02/2020 in ER 3. Chronic migraine headache with aura 4.  Cerebral small vessel disease  -Discharged in April on dual antiplatelet therapy aspirin and Plavix for 3 weeks, then continue aspirin 81 mg daily -After ER visit 10/02/2020, recommended 3 additional  weeks of dual platelet therapy, never did this, just did Plavix 75 mg daily, will have her restart both aspirin 81 mg and continue Plavix 75 mg daily for 3 weeks; will discuss with Dr. Yan who is out, regarding single agent choice going forward -Continue Topamax at current dosing for migraine prevention, on average 1 headache every 6 months, if felt side effect, consider switch to new agent or extended release Topamax, has itching to sulfa so likely not Zonegran  -BP looks good 120/76 today -CTA head and neck with minimal atherosclerosis, without LVO, or significant stenosis in the head or neck, likely small vessel etiology -2D echo with preserved EF, no valvular disease -Telemetry monitoring no evidence of A. fib or arrhythmia -Diabetes well controlled, A1c 6.5, not on any medication -LDL 77 on Lipitor 40 mg, saw PCP LDL 84, consider higher dose, patient concerned with myalgias, or Zetia mentioned with PCP -Continue PT/OT, main deficit is gait instability on right side post-CVA -Follow-up in 4 to 6 months or sooner if needed, will review have Dr. Yan review   Addendum 10/21/20 SS: I reviewed with Dr. Yan, was   on aspirin 81 mg prior to CVA. Right now on DAPT, will do this for total of 3 weeks, then remain on Plavix 75 mg daily alone. I called the patient.   I spent 45 minutes of face-to-face and non-face-to-face time with patient.  This included previsit chart review, lab review, study review, reviewing medications, symptoms at stroke onset, secondary stroke prevention, and follow-up.   Sarah Slack, AGNP-C, DNP 10/12/2020, 11:14 AM [facility] [address], [address] [place], [place] [address] [phone]   

## 2020-10-12 NOTE — Patient Instructions (Signed)
Let me review chart, will call today with recommendation for platelet therapy  See you back in 4-6 months

## 2020-10-13 ENCOUNTER — Ambulatory Visit: Payer: Medicare HMO

## 2020-10-13 ENCOUNTER — Other Ambulatory Visit: Payer: Self-pay

## 2020-10-13 ENCOUNTER — Ambulatory Visit: Payer: Medicare HMO | Admitting: Occupational Therapy

## 2020-10-13 DIAGNOSIS — R2689 Other abnormalities of gait and mobility: Secondary | ICD-10-CM

## 2020-10-13 DIAGNOSIS — M6281 Muscle weakness (generalized): Secondary | ICD-10-CM

## 2020-10-13 DIAGNOSIS — R278 Other lack of coordination: Secondary | ICD-10-CM | POA: Diagnosis not present

## 2020-10-13 DIAGNOSIS — R262 Difficulty in walking, not elsewhere classified: Secondary | ICD-10-CM

## 2020-10-13 DIAGNOSIS — I69851 Hemiplegia and hemiparesis following other cerebrovascular disease affecting right dominant side: Secondary | ICD-10-CM | POA: Diagnosis not present

## 2020-10-13 DIAGNOSIS — R2681 Unsteadiness on feet: Secondary | ICD-10-CM

## 2020-10-13 NOTE — Therapy (Signed)
[facility] [address] [place], [place], [address] Phone: [phone]   Fax:  [phone]  Occupational Therapy Treatment  Patient Details  Name: Dana [name] MRN: [id number] Date of Birth: 1945/06/20 Referring Provider (OT): [name] MD - follows up with Butler Denmark   Encounter Date: 10/13/2020   OT End of Session - 10/13/20 1104    Visit Number 3    Number of Visits 13    Date for OT Re-Evaluation 12/01/20    Authorization Type [employer]    Authorization Time Period VL:MN $20 copay per day    Authorization - Visit Number 3   corrected count   Authorization - Number of Visits 10    OT Start Time 1103    OT Stop Time 1143    OT Time Calculation (min) 40 min           Past Medical History:  Diagnosis Date  . Allergy   . Anemia   . Arthritis   . Cataract   . Controlled type 2 diabetes mellitus without complication, without long-term current use of insulin ([place])   . Diverticulitis   . History of chicken pox   . Hyperlipidemia   . Kidney stone   . 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 Migraine   . Urge incontinence 04/29/2014    Past Surgical History:  Procedure Laterality Date  . ABDOMINAL HYSTERECTOMY  2006   cervix remains, menorrhagia  . APPENDECTOMY  1970  . [place]   biopsy-left  . BREAST SURGERY  1995   right milk duct removed  . CARPAL TUNNEL RELEASE Right 02/2017  . CHOLECYSTECTOMY  1971  . COLONOSCOPY  11/2005   diverticulosis o/w WNL [facility])  . DEXA  2015   spine -0.4, hip -1.0 WNL  . LITHOTRIPSY    . LUMBAR EPIDURAL INJECTION Left 10/2017   transforaminal L L3/4 (Dr Maryjean Ka)  . TUBAL LIGATION      There were no vitals filed for this visit.   Subjective Assessment - 10/13/20 1103    Subjective  Denies pain    Pertinent History Migraines, HLD, DM, GERD, Lactose Intolerance, Kidney Stones, Diverticulitis, Anemia, Incontinence    Limitations no driving.  fall risk.    Patient Stated Goals "a lot of trouble with fine motor skills" "writing"    Currently in Pain? No/denies              Treatment: holding several pegs in hand while placing them in grooved pegboard with RUE for in hand manipulation, min difficulty/ v.c then removing them with tweezers for sustained pinch Distal finger control worksheet with RUE for improved fine motor control and dexterity for handwriting using foam grip, good overall performance Handwriting activities with foam grip, continuous "l" and continuous "e" followed by writing on lined paper with elbow supported on table. Pt wrote 5 sentences with 100% legibility and letter size . Pt planned and wrote out a simple meal plan for she and her husband, pt require min questioning cues to generate, pt wrote with 100% legbility                  OT Education - 10/13/20 1113    Education Details red putty HEP for grip and pinch    Person(s) Educated Patient    Methods Explanation;Demonstration;Verbal cues;Handout    Comprehension Returned demonstration;Verbal cues required;Verbalized understanding            OT Short  Term Goals - 10/08/20 1549      OT SHORT TERM GOAL #1   Title Pt will be independent with HEP or coordination and grip strength in RUE    Time 3    Period Weeks    Status On-going   issued coordination HEP 10/08/20   Target Date 10/27/20      OT SHORT TERM GOAL #2   Title Pt will write a sentence with 90% legibility with use of adapted strategies and/or equipment PRN.    Baseline 50% of prior level of handwriting    Time 3    Period Weeks    Status New      OT SHORT TERM GOAL #3   Title Pt will increased fine motor coordination in RUE by completing 9 hole peg test in 27 seconds or less.    Baseline RUE 30, LUE 20.1    Time 3    Period Weeks    Status New      OT SHORT TERM GOAL #4   Title Pt will safely navigate in kitchen with mod I with no LOB and with good walker  management.    Time 3    Period Weeks    Status New             OT Long Term Goals - 10/08/20 1537      OT LONG TERM GOAL #1   Title Pt will complete FOTO at discharge and score at least 70%    Baseline 57%    Time 8    Period Weeks    Status Revised    Target Date 12/01/20      OT LONG TERM GOAL #2   Title Pt will be independent with any updated HEPs    Time 8    Period Weeks    Status New      OT LONG TERM GOAL #3   Title Pt will improve grip strength in RUE by 5 lbs or more in order to increase strength in dominant hand.    Baseline R 28.6, L 35.7    Time 8    Period Weeks    Status New      OT LONG TERM GOAL #4   Title Pt will improve fine motor coordination in RUE by 5 seconds on 9 hole peg test    Baseline R 30s L 20.1    Time 8    Period Weeks    Status New      OT LONG TERM GOAL #5   Title Pt will plan and execute a simple meal for she and her spouse with min assistance from spouse.    Time 8    Period Weeks    Status New                 Plan - 10/13/20 1107    Clinical Impression Statement Pt is progressing towards goals. She demonstrates understanding of putty HEP.    OT Occupational Profile and History Problem Focused Assessment - Including review of records relating to presenting problem    Occupational performance deficits (Please refer to evaluation for details): IADL's;ADL's;Leisure    Body Structure / Function / Physical Skills ADL;FMC;Coordination;Flexibility;IADL;ROM;UE functional use;Pain;Dexterity;Strength;Decreased knowledge of use of DME    Cognitive Skills Problem Solve    Rehab Potential Good    Clinical Decision Making Limited treatment options, no task modification necessary    Comorbidities Affecting Occupational Performance: None    Modification or Assistance to  Complete Evaluation  No modification of tasks or assist necessary to complete eval    OT Frequency 2x / week    OT Duration 8 weeks   12 visits over 8 weeks to  account for any missed visits or scheduling difficulties.   OT Treatment/Interventions Self-care/ADL training;Functional Furniture conservator/restorer;Therapeutic exercise;Cognitive remediation/compensation;Patient/family education;Energy conservation;Neuromuscular education;DME and/or AE instruction;Therapeutic activities;Moist Heat    Plan continue to address coordination and grip strength    Consulted and Agree with Plan of Care Patient           Patient will benefit from skilled therapeutic intervention in order to improve the following deficits and impairments:   Body Structure / Function / Physical Skills: ADL,FMC,Coordination,Flexibility,IADL,ROM,UE functional use,Pain,Dexterity,Strength,Decreased knowledge of use of DME Cognitive Skills: Problem Solve     Visit Diagnosis: Muscle weakness (generalized)  Unsteadiness on feet  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)  Other lack of coordination  Other abnormalities of gait and mobility    Problem List Patient Active Problem List   Diagnosis Date Noted  . Dysesthesia 09/21/2020  . Hemiparesis due to recent stroke ([place]) 08/30/2020  . Right sided weakness 08/29/2020  . Pain in lateral portion of right ankle 07/20/2020  . Plantar fasciitis of right foot 05/19/2020  . Small vessel disease, cerebrovascular   . Overactive bladder 10/24/2018  . Amnesia memory loss 05/19/2017  . Overweight (BMI 25.0-29.9) 05/09/2017  . Lumbar herniated disc 02/01/2017  . Medicare annual wellness visit, subsequent 05/03/2016  . 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 History of kidney stones   . Cervical pain (neck) 12/04/2015  . Health maintenance examination 04/29/2014  . Paresthesia 04/29/2014  . Advanced care planning/counseling discussion 04/29/2014  . PFD (pelvic floor dysfunction) 04/29/2014  . Controlled diabetes mellitus type 2 with complications ([place]) 16/02/9603  . Osteoarthritis of  hand 11/07/2012  . Hx of diverticulitis of colon 11/07/2012  . Allergic rhinitis 11/07/2012  . Hyperlipidemia associated with type 2 diabetes mellitus ([place]) 11/07/2012  . Migraine 11/07/2012  . Hereditary and idiopathic peripheral neuropathy 11/07/2012  . LGSIL Pap smear of vaginal cuff 11/07/2012  . GERD (gastroesophageal reflux disease) 11/07/2012  . Family history of premature coronary heart disease 11/07/2012    Avery Eustice 10/13/2020, 11:18 AM  [facility] [address] [place], [place], [address] Phone: [phone]   Fax:  [phone]  Name: Dana [name] MRN: [id number] Date of Birth: [birth year]

## 2020-10-13 NOTE — Therapy (Signed)
[facility] [address] [place], [place], [address] Phone: [phone]   Fax:  [phone]  Physical Therapy Treatment  Patient Details  Name: Dana [name] MRN: [id number] Date of Birth: 1946-03-29 Referring Provider (PT): Referred by: [name], NP. PCP is [name], MD   Encounter Date: 10/13/2020   PT End of Session - 10/13/20 1018    Visit Number 6    Number of Visits 17    Date for PT Re-Evaluation 12/15/20   POC for 8 weeks, Cert for 90 days   Authorization Type [employer] (10th Visit PN)    Authorization Time Period awaiting authorization    Progress Note Due on Visit 10    PT Start Time 1016    PT Stop Time 1059    PT Time Calculation (min) 43 min    Equipment Utilized During Treatment Gait belt    Activity Tolerance Patient tolerated treatment well    Behavior During Therapy WFL for tasks assessed/performed           Past Medical History:  Diagnosis Date  . Allergy   . Anemia   . Arthritis   . Cataract   . Controlled type 2 diabetes mellitus without complication, without long-term current use of insulin ([place])   . Diverticulitis   . History of chicken pox   . Hyperlipidemia   . Kidney stone   . 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 Migraine   . Urge incontinence 04/29/2014    Past Surgical History:  Procedure Laterality Date  . ABDOMINAL HYSTERECTOMY  2006   cervix remains, menorrhagia  . APPENDECTOMY  1970  . [place]   biopsy-left  . BREAST SURGERY  1995   right milk duct removed  . CARPAL TUNNEL RELEASE Right 02/2017  . CHOLECYSTECTOMY  1971  . COLONOSCOPY  11/2005   diverticulosis o/w WNL [facility])  . DEXA  2015   spine -0.4, hip -1.0 WNL  . LITHOTRIPSY    . LUMBAR EPIDURAL INJECTION Left 10/2017   transforaminal L L3/4 (Dr Maryjean Ka)  . TUBAL LIGATION      There were no vitals filed for this visit.   Subjective Assessment - 10/13/20 1018     Subjective Patient reports had visit at [facility] yesterday. No pain. No falls to report. Reports that the stretches went well, is not having the pain in the hip anymore.    Patient is accompained by: Family member   Husband [name])   Pertinent History Migraines, HLD, DM, GERD, Lactose Intolerance, Kidney Stones, Diverticulitis, Anemia, Incontinence    Limitations Sitting;Standing;Walking    How long can you walk comfortably? 10 minutes    Patient Stated Goals Walk without Assistance;    Currently in Pain? No/denies    Pain Onset Today             [facility] Adult PT Treatment/Exercise - 10/13/20 0001      Transfers   Transfers Sit to Stand;Stand to Sit    Sit to Stand 5: Supervision    Stand to Sit 5: Supervision      Ambulation/Gait   Ambulation/Gait Yes    Ambulation/Gait Assistance 5: Supervision    Ambulation/Gait Assistance Details Completed ambulation around therapy session with AFO donned and w/ rollator x 460 ft supervision throughout. Cues to stand tall with ambulation, and improved step length on LLE to promote improved weight shift to R. Patient stopped due to fatigue/SOB. Vitals stable.  Ambulation Distance (Feet) 460 Feet    Assistive device Rollator    Gait Pattern Step-through pattern;Decreased arm swing - right;Decreased arm swing - left;Decreased step length - right;Decreased step length - left;Decreased stance time - right;Decreased hip/knee flexion - right;Decreased dorsiflexion - right;Decreased weight shift to right;Poor foot clearance - right    Ambulation Surface Level;Indoor    Gait velocity 14.63 secs = 2.24 ft/sec    Gait Comments PT donned R Posterior Ottobok prior to ambulation      Standardized Balance Assessment   Standardized Balance Assessment Timed Up and Go Test;Berg Balance Test      Berg Balance Test   Sit to Stand Able to stand without using hands and stabilize independently    Standing Unsupported Able to stand safely 2 minutes    Sitting with Back  Unsupported but Feet Supported on Floor or Stool Able to sit safely and securely 2 minutes    Stand to Sit Sits safely with minimal use of hands    Transfers Able to transfer safely, minor use of hands    Standing Unsupported with Eyes Closed Able to stand 10 seconds safely    Standing Ubsupported with Feet Together Able to place feet together independently and stand 1 minute safely    From Standing, Reach Forward with Outstretched Arm Can reach confidently >25 cm (10")    From Standing Position, Pick up Object from Floor Able to pick up shoe safely and easily    From Standing Position, Turn to Look Behind Over each Shoulder Looks behind one side only/other side shows less weight shift    Turn 360 Degrees Needs close supervision or verbal cueing    Standing Unsupported, Alternately Place Feet on Step/Stool Able to complete 4 steps without aid or supervision    Standing Unsupported, One Foot in Front Able to take small step independently and hold 30 seconds    Standing on One Leg Tries to lift leg/unable to hold 3 seconds but remains standing independently    Total Score 45      Timed Up and Go Test   TUG Normal TUG    Normal TUG (seconds) 14.57   with rollator     Exercises   Exercises Knee/Hip      Knee/Hip Exercises: Aerobic   Nustep Completed NuStep with BLE/BUE on Level 4 x 5 minutes with cues to maintain pace >/= 50 steps per minute. Patient tolerating well today.           Completed verbal review of the following HEP to ensure compliance, patient has no question/concerns at this time:  Access Code: [email or web address] URL: [email or web address] Date: 10/08/2020 Prepared by: Baldomero Lamy  Exercises Supine Bridge - 1 x daily - 5 x weekly - 2 sets - 10 reps Clamshell - 1 x daily - 5 x weekly - 2 sets - 10 reps Supine March with Resistance Band - 1 x daily - 5 x weekly - 2 sets - 10 reps Sit to Stand with Arms Crossed - 1 x daily - 5 x weekly - 2 sets - 10 reps Romberg  Stance with Eyes Closed - 1 x daily - 5 x weekly - 1 sets - 3 reps - 30 hold Side Stepping with Counter Support - 1 x daily - 5 x weekly - 1 sets - 4 reps Seated Hamstring Stretch - 1 x daily - 5 x weekly - 1 sets - 3 reps - 30 seconds hold Supine Single Knee to Chest Stretch -  1 x daily - 5 x weekly - 1 sets - 3 reps - 30 seconds hold       PT Education - 10/13/20 1020    Education Details Progress toward STGs    Person(s) Educated Patient    Methods Explanation    Comprehension Verbalized understanding            PT Short Term Goals - 10/13/20 1020      PT SHORT TERM GOAL #1   Title Patient will be independent with initial HEP for balance/strength (All STGs Due: 10/14/20)    Baseline reports independence, completing approx 3-4x/week    Time 4    Period Weeks    Status Achieved    Target Date 10/14/20      PT SHORT TERM GOAL #2   Title Patient will improve gait speed to >/= 1.5 ft/sec with LRAD    Baseline 1.19 ft/sec w/ RW; 2.24 ft/sec    Time 4    Period Weeks    Status Achieved      PT SHORT TERM GOAL #3   Title Patient will improve Berg Balance to >/= 39/56 to indicate improved balance and reduced fall risk.    Baseline 34/56; 45/56    Time 4    Period Weeks    Status Achieved      PT SHORT TERM GOAL #4   Title Patient will improve TUG to </= 20 secs w/ LRAD to indicate reduced fall risk.    Baseline 23.56 secs; 14.57 secs w/ Rollator    Time 4    Period Weeks    Status Achieved      PT SHORT TERM GOAL #5   Title Patient will ambulate >/= 500 ft w/ LRAD and supervision for improved household mobility    Baseline 100 ft CGA; 460 ft supervision with rollator, 5/10 fatigue    Time 4    Period Weeks    Status Partially Met             PT Long Term Goals - 09/16/20 1743      PT LONG TERM GOAL #1   Title Patient will be independent with final HEP for balance/strength (All LTGs Due: 11/11/20)    Baseline no HEP established    Time 8    Period Weeks     Status New    Target Date 11/11/20      PT LONG TERM GOAL #2   Title Patient will improve gait speed to >/= 2.0 ft/sec with LRAD    Baseline 1.19 ft/sec    Time 8    Period Weeks    Status New      PT LONG TERM GOAL #3   Title Patient will improve Berg Balance to >/= 45/56 to indicate improved balance and reduced fall risk    Baseline 34/56    Time 8    Period Weeks    Status New      PT LONG TERM GOAL #4   Title Patient will improve TUG to </= 15 secs w/ LRAD to indicate reduced fall risk    Baseline 23.56 secs    Time 8    Period Weeks    Status New      PT LONG TERM GOAL #5   Title Patient will improve 5xSTS to </= 12 secs to indicate reduced fall risk    Baseline 14.42 secs    Time 8    Period Weeks    Status New  Additional Long Term Goals   Additional Long Term Goals Yes      PT LONG TERM GOAL #6   Title Patient will demo ability to ascend/descend 12 stairs with single rail and supervision for improved ability to ambulate to second floor of home    Baseline not currently completing    Time 8    Period Weeks    Status New      PT LONG TERM GOAL #7   Title Patient will improve FOTO to >/= 65%    Baseline 49%    Time 8    Period Weeks    Status New                 Plan - 10/13/20 1040    Clinical Impression Statement Today's skilled PT session included assesment of patient's progress toward STG. Patient able to meet and/or partially meet all STGs during session. Patient improved Berg Balance to 45/56, demonstrating improved balance and reduced fall risk. Patient is ambulating at 2.24 ft/sec with rollator at this time. patient is making progress with PT services and will benefit from continued skilled PT to progress toward all LTGs.    Personal Factors and Comorbidities Comorbidity 3+    Comorbidities Migraines, HLD, DM, GERD, Lactose Intolerance, Kidney Stones, Diverticulitis, Anemia, Incontinence    Examination-Activity Limitations Bed  Mobility;Reach Overhead;Sit;Locomotion Level;Stand;Transfers;Stairs    Examination-Participation Restrictions Community Activity;Driving    Stability/Clinical Decision Making Stable/Uncomplicated    Rehab Potential Good    PT Frequency 2x / week    PT Duration 8 weeks    PT Treatment/Interventions ADLs/Self Care Home Management;Electrical Stimulation;Moist Heat;Cryotherapy;DME Instruction;Gait training;Stair training;Functional mobility training;Therapeutic activities;Balance training;Therapeutic exercise;Neuromuscular re-education;Patient/family education;Orthotic Fit/Training;Passive range of motion;Vestibular    PT Next Visit Plan Have been using  R AFO (used R Post Ottobok), but would like to trial bioness. Please set up.  Continue high level balance, weight shift activites, RLE strengthening.    Consulted and Agree with Plan of Care Patient;Family member/caregiver    Family Member Consulted Husband           Patient will benefit from skilled therapeutic intervention in order to improve the following deficits and impairments:  Abnormal gait,Decreased activity tolerance,Decreased endurance,Decreased knowledge of use of DME,Decreased range of motion,Decreased strength,Impaired UE functional use,Difficulty walking,Decreased balance,Decreased safety awareness,Decreased coordination  Visit Diagnosis: Muscle weakness (generalized)  Unsteadiness on feet  Difficulty in walking, not elsewhere classified  Other abnormalities of gait and mobility     Problem List Patient Active Problem List   Diagnosis Date Noted  . Dysesthesia 09/21/2020  . Hemiparesis due to recent stroke ([place]) 08/30/2020  . Right sided weakness 08/29/2020  . Pain in lateral portion of right ankle 07/20/2020  . Plantar fasciitis of right foot 05/19/2020  . Small vessel disease, cerebrovascular   . Overactive bladder 10/24/2018  . Amnesia memory loss 05/19/2017  . Overweight (BMI 25.0-29.9) 05/09/2017  . Lumbar  herniated disc 02/01/2017  . Medicare annual wellness visit, subsequent 05/03/2016  . 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 History of kidney stones   . Cervical pain (neck) 12/04/2015  . Health maintenance examination 04/29/2014  . Paresthesia 04/29/2014  . Advanced care planning/counseling discussion 04/29/2014  . PFD (pelvic floor dysfunction) 04/29/2014  . Controlled diabetes mellitus type 2 with complications ([place]) 63/84/6659  . 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  . Hx of diverticulitis of colon 11/07/2012  . Allergic rhinitis 11/07/2012  . Hyperlipidemia associated with type 2 diabetes mellitus ([place]) 11/07/2012  .  Migraine 11/07/2012  . Hereditary and idiopathic peripheral neuropathy 11/07/2012  . LGSIL Pap smear of vaginal cuff 11/07/2012  . GERD (gastroesophageal reflux disease) 11/07/2012  . Family history of premature coronary heart disease 11/07/2012    Jones Bales, PT, DPT 10/13/2020, 11:22 AM  [facility] [address] [place], [place], [address] Phone: [phone]   Fax:  [phone]  Name: Dana [name] MRN: [id number] Date of Birth: 1946/03/12

## 2020-10-13 NOTE — Patient Instructions (Signed)
1. Grip Strengthening (Resistive Putty)   Squeeze putty using thumb and all fingers. Repeat _20___ times. Do __2__ sessions per day.   2. Roll putty into tube on table and pinch between each finger and thumb x 10 reps each. (can do ring and small finger together)     Copyright  VHI. All rights reserved.   

## 2020-10-16 ENCOUNTER — Ambulatory Visit: Payer: Medicare HMO | Admitting: Physical Therapy

## 2020-10-19 ENCOUNTER — Other Ambulatory Visit: Payer: Self-pay

## 2020-10-19 ENCOUNTER — Ambulatory Visit: Payer: Medicare HMO | Admitting: Occupational Therapy

## 2020-10-19 ENCOUNTER — Encounter: Payer: Medicare HMO | Admitting: Speech Pathology

## 2020-10-19 ENCOUNTER — Telehealth: Payer: Self-pay | Admitting: Family Medicine

## 2020-10-19 ENCOUNTER — Encounter: Payer: Self-pay | Admitting: Occupational Therapy

## 2020-10-19 ENCOUNTER — Encounter: Payer: Self-pay | Admitting: Family Medicine

## 2020-10-19 ENCOUNTER — Ambulatory Visit (INDEPENDENT_AMBULATORY_CARE_PROVIDER_SITE_OTHER): Payer: Medicare HMO | Admitting: Family Medicine

## 2020-10-19 ENCOUNTER — Ambulatory Visit: Payer: Medicare HMO

## 2020-10-19 VITALS — BP 124/70 | HR 69 | Temp 98.2°F | Ht 63.0 in | Wt 167.2 lb

## 2020-10-19 DIAGNOSIS — R278 Other lack of coordination: Secondary | ICD-10-CM

## 2020-10-19 DIAGNOSIS — E118 Type 2 diabetes mellitus with unspecified complications: Secondary | ICD-10-CM

## 2020-10-19 DIAGNOSIS — I69359 Hemiplegia and hemiparesis following cerebral infarction affecting unspecified side: Secondary | ICD-10-CM | POA: Diagnosis not present

## 2020-10-19 DIAGNOSIS — R209 Unspecified disturbances of skin sensation: Secondary | ICD-10-CM | POA: Diagnosis not present

## 2020-10-19 DIAGNOSIS — E785 Hyperlipidemia, unspecified: Secondary | ICD-10-CM

## 2020-10-19 DIAGNOSIS — I679 Cerebrovascular disease, unspecified: Secondary | ICD-10-CM

## 2020-10-19 DIAGNOSIS — E1169 Type 2 diabetes mellitus with other specified complication: Secondary | ICD-10-CM | POA: Diagnosis not present

## 2020-10-19 DIAGNOSIS — R262 Difficulty in walking, not elsewhere classified: Secondary | ICD-10-CM | POA: Diagnosis not present

## 2020-10-19 DIAGNOSIS — I69851 Hemiplegia and hemiparesis following other cerebrovascular disease affecting right dominant side: Secondary | ICD-10-CM

## 2020-10-19 DIAGNOSIS — L299 Pruritus, unspecified: Secondary | ICD-10-CM | POA: Diagnosis not present

## 2020-10-19 DIAGNOSIS — M6281 Muscle weakness (generalized): Secondary | ICD-10-CM | POA: Diagnosis not present

## 2020-10-19 DIAGNOSIS — R2681 Unsteadiness on feet: Secondary | ICD-10-CM | POA: Diagnosis not present

## 2020-10-19 DIAGNOSIS — R2689 Other abnormalities of gait and mobility: Secondary | ICD-10-CM | POA: Diagnosis not present

## 2020-10-19 MED ORDER — EZETIMIBE 10 MG PO TABS
10.0000 mg | ORAL_TABLET | Freq: Every day | ORAL | 0 refills | Status: DC
Start: 2020-10-19 — End: 2020-11-18

## 2020-10-19 NOTE — Therapy (Signed)
[facility] [address] [place], [place], [address] Phone: [phone]   Fax:  [phone]  Occupational Therapy Treatment  Patient Details  Name: Dana [name] MRN: [id number] Date of Birth: October 10, 1945 Referring Provider (OT): [name] MD - follows up with Butler [facility]   Encounter Date: 10/19/2020   OT End of Session - 10/19/20 1407     Visit Number 4    Number of Visits 13    Date for OT Re-Evaluation 12/01/20    Authorization Type [employer]    Authorization Time Period VL:MN $20 copay per day    Authorization - Visit Number 4   corrected count   Authorization - Number of Visits 10    OT Start Time 6962    OT Stop Time 1358    OT Time Calculation (min) 43 min    Activity Tolerance Patient tolerated treatment well    Behavior During Therapy [facility] for tasks assessed/performed             Past Medical History:  Diagnosis Date   Allergy    Anemia    Arthritis    Cataract    Controlled type 2 diabetes mellitus without complication, without long-term current use of insulin ([place])    Diverticulitis    History of chicken pox    Hyperlipidemia    Kidney stone    Lactose intolerance in adult    Migraine    Urge incontinence 04/29/2014    Past Surgical History:  Procedure Laterality Date   ABDOMINAL HYSTERECTOMY  2006   cervix remains, menorrhagia   [place]   right milk duct removed   CARPAL TUNNEL RELEASE Right 02/2017   CHOLECYSTECTOMY  1971   COLONOSCOPY  11/2005   diverticulosis o/w WNL Mikel Cella)   DEXA  2015   spine -0.4, hip -1.0 WNL   LITHOTRIPSY     LUMBAR EPIDURAL INJECTION Left 10/2017   transforaminal L L3/4 (Dr Maryjean Ka)   TUBAL LIGATION      There were no vitals filed for this visit.   Subjective Assessment - 10/19/20 1320     Subjective  Pt states she noticed sensation of cold in LLE starting this  weekend. She contacted her PCP who referred her to Neurology. Denies numbness, headaches, vision changes. OT encouraged pt to seek additional care if symptoms progressed/worsened.    Pertinent History Migraines, HLD, DM, GERD, Lactose Intolerance, Kidney Stones, Diverticulitis, Anemia, Incontinence    Limitations no driving. fall risk.    Patient Stated Goals "a lot of trouble with fine motor skills" "writing"    Currently in Pain? No/denies             OT Treatments/Exercises - 10/19/20 1617 Pegboard activity w/ easy-grip pegs to facilitate RUE FMC, in-hand manipulation, and hand strengthening; completed activity without difficulty  Retrieving marbles 2 at a time and translating from palm to fingertips to place on easy-grip pegs. OT progressively graded activity from 2 marbles up to 5 at a time; able to complete activity w/ less than 3 drops  Retrieving marbles from easy-grip pegs to place on small tees positioned close together for increased challenge; able to complete activity w/out difficulty and no drops  Tracing over fine lines of complex shapes w/ ball point and felt-tip pen; attempted w/ tan built-up handle w/ neutral results. Mild decreased smoothness of lines observed. Also completed writing  a sentence; increased comfort w/out built-up handle in 2nd attempt     OT Short Term Goals - 10/19/20 1349       OT SHORT TERM GOAL #1   Title Pt will be independent with HEP or coordination and grip strength in RUE    Time 3    Period Weeks    Status On-going   issued coordination HEP 10/08/20   Target Date 10/27/20      OT SHORT TERM GOAL #2   Title Pt will write a sentence with 90% legibility with use of adapted strategies and/or equipment PRN.    Baseline 50% of prior level of handwriting    Time 3    Period Weeks    Status On-going   Pt wrote 5 sentences with foam grip with !00% legibility     OT SHORT TERM GOAL #3   Title Pt will increased fine motor coordination in RUE by  completing 9 hole peg test in 27 seconds or less.    Baseline RUE 30, LUE 20.1    Time 3    Period Weeks    Status Achieved   10/19/20 - 24 sec w/ RUE     OT SHORT TERM GOAL #4   Title Pt will safely navigate in kitchen with mod I with no LOB and with good walker management.    Time 3    Period Weeks    Status New               OT Long Term Goals - 10/19/20 1356       OT LONG TERM GOAL #1   Title Pt will complete FOTO at discharge and score at least 70%    Baseline 57%    Time 8    Period Weeks    Status Revised      OT LONG TERM GOAL #2   Title Pt will be independent with any updated HEPs    Time 8    Period Weeks    Status New      OT LONG TERM GOAL #3   Title Pt will improve grip strength in RUE by 5 lbs or more in order to increase strength in dominant hand.    Baseline R 28.6, L 35.7    Time 8    Period Weeks    Status New      OT LONG TERM GOAL #4   Title Pt will improve fine motor coordination in RUE by 5 seconds on 9 hole peg test    Baseline R 30s L 20.1    Time 8    Period Weeks    Status Achieved   10/19/20 - 24 sec RUE     OT LONG TERM GOAL #5   Title Pt will plan and execute a simple meal for she and her spouse with min assistance from spouse.    Time 8    Period Weeks    Status New               Plan - 10/19/20 1414     Clinical Impression Statement Pt demo'd good [place] w/ right, dominant UE this session, completing therapeutic activities w/out difficulty and minimal drops. Due to success, OT re-assessed 9-HPT w/ pt able to improve time by 6 sec and was able to achieve STG3 and LTG4; OT provided interpretation of results.    OT Occupational Profile and History Problem Focused Assessment - Including review of records relating to presenting problem  Occupational performance deficits (Please refer to evaluation for details): IADL's;ADL's;Leisure    Body Structure / Function / Physical Skills ADL;FMC;Coordination;Flexibility;IADL;ROM;UE  functional use;Pain;Dexterity;Strength;Decreased knowledge of use of DME    Cognitive Skills Problem Solve    Rehab Potential Good    Clinical Decision Making Limited treatment options, no task modification necessary    Comorbidities Affecting Occupational Performance: None    Modification or Assistance to Complete Evaluation  No modification of tasks or assist necessary to complete eval    OT Frequency 2x / week    OT Duration 8 weeks   12 visits over 8 weeks to account for any missed visits or scheduling difficulties.   OT Treatment/Interventions Self-care/ADL training;Functional Furniture conservator/restorer;Therapeutic exercise;Cognitive remediation/compensation;Patient/family education;Energy conservation;Neuromuscular education;DME and/or AE instruction;Therapeutic activities;Moist Heat    Plan continue to address grip strength; kitchen mobility (STG4)    Consulted and Agree with Plan of Care Patient             Patient will benefit from skilled therapeutic intervention in order to improve the following deficits and impairments:   Body Structure / Function / Physical Skills: ADL, FMC, Coordination, Flexibility, IADL, ROM, UE functional use, Pain, Dexterity, Strength, Decreased knowledge of use of DME Cognitive Skills: Problem Solve     Visit Diagnosis: Hemiplegia and hemiparesis following other cerebrovascular disease affecting right dominant side ([place])  Other lack of coordination    Problem List Patient Active Problem List   Diagnosis Date Noted   Itching of both hands 10/20/2020   Sensation of cold in lower extremity 10/20/2020   Dysesthesia 09/21/2020   Hemiparesis due to recent stroke ([place]) 08/30/2020   Right sided weakness 08/29/2020   Pain in lateral portion of right ankle 07/20/2020   Plantar fasciitis of right foot 05/19/2020   Small vessel disease, cerebrovascular    Overactive bladder 10/24/2018   Amnesia memory loss 05/19/2017   Overweight (BMI 25.0-29.9) 05/09/2017    Lumbar herniated disc 02/01/2017   Medicare annual wellness visit, subsequent 05/03/2016   Lactose intolerance in adult    History of kidney stones    Cervical pain (neck) 12/04/2015   Health maintenance examination 04/29/2014   Paresthesia 04/29/2014   Advanced care planning/counseling discussion 04/29/2014   PFD (pelvic floor dysfunction) 04/29/2014   Controlled diabetes mellitus type 2 with complications ([place]) 39/07/90   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   Hx of diverticulitis of colon 11/07/2012   Allergic rhinitis 11/07/2012   Hyperlipidemia associated with type 2 diabetes mellitus ([place]) 11/07/2012   Migraine 11/07/2012   Hereditary and idiopathic peripheral neuropathy 11/07/2012   LGSIL Pap smear of vaginal cuff 11/07/2012   GERD (gastroesophageal reflux disease) 11/07/2012   Family history of premature coronary heart disease 11/07/2012     Kathrine Cords, OTR/L, MSOT  10/19/2020, 2:16 PM  [facility] [address] [place], [place], [address] Phone: [phone]   Fax:  [phone]  Name: Dana [name] MRN: [id number] Date of Birth: 03/26/1946

## 2020-10-19 NOTE — Telephone Encounter (Signed)
[facility] Night - Client TELEPHONE ADVICE RECORD [facility] Patient Name: Dana [name] Gender: Female DOB: 09-28-1945 Age: 75 Y 9 M 24 D Return Phone Number: [phone] (Primary), [phone] (Secondary) Address: City/ State/ Zip: [place] [address] Client [facility] Night - Client Client Site [facility] Physician [name] - MD Contact Type Call Who Is Calling Patient / Member / Family / Caregiver Call Type Triage / Clinical Relationship To Patient Self Return Phone Number [phone] (Primary) Chief Complaint NUMBNESS/TINGLING- sudden on one side of the body or face Reason for Call Symptomatic / Request for [place] states that she has a question about her Gabapentin that was prescribed by Dr. Danise Mina. For the third day in a row her left leg is getting cold and numb. She is not sure if it is a side effect of that medication or not. She did have a stroke on April 23rd on her right side and she is also on a blood thinner. She is not sure if one or both of the medications could be causing this issue. Translation No Nurse Assessment Nurse: Malachi Carl, RN, Jana Half Date/Time Dana [name] Time): 10/17/2020 12:03:09 PM Confirm and document reason for call. If symptomatic, describe symptoms. ---The caller states that she has been on Gabapentin 100 mg since May 16. That was prescribed by Dr. Danise Mina. For the third day in a row her left leg is getting cold and tingly. She did have a stroke on April 23rd on her right side and she is also on a blood thinner. She is not sure if one or both of the medications could be causing this issue. The patient is sitting and the leg does not seem to be swollen. Does the patient have any new or worsening symptoms? ---Yes Will a triage be completed? ---Yes Related visit to physician within the last 2 weeks? ---N/A Does  the PT have any chronic conditions? (i.e. diabetes, asthma, this includes High risk factors for pregnancy, etc.) ---Yes List chronic conditions. ---borderline diabetes S/P CVA Is this a behavioral health or substance abuse call? ---No PLEASE NOTE: All timestamps contained within this report are represented as [place] Standard Time. CONFIDENTIALTY NOTICE: This fax transmission is intended only for the addressee. It contains information that is legally privileged, confidential or otherwise protected from use or disclosure. If you are not the intended recipient, you are strictly prohibited from reviewing, disclosing, copying using or disseminating any of this information or taking any action in reliance on or regarding this information. If you have received this fax in error, please notify us immediately by telephone so that we can arrange for its return to [place]. Phone: [phone], Toll-Free: [phone], Fax: [phone] Page: 2 of 2 Call Id: [id number] Guidelines Guideline Title Affirmed Question Affirmed Notes Nurse Date/Time Dana [name] Time) Neurologic Deficit [1] Numbness or tingling in one or both feet AND [2] is a chronic symptom (recurrent or ongoing AND present > 4 weeks) Scarlette Shorts 10/17/2020 12:12:35 PM Disp. Time Dana [name] Time) Disposition Final User 10/17/2020 12:01:41 PM Send to Urgent Queue Wynema Birch 10/17/2020 12:18:04 PM SEE PCP WITHIN 3 DAYS Yes Malachi Carl, RN, Leward Quan Disagree/Comply Comply Caller Understands Yes PreDisposition InappropriateToAsk Care Advice Given Per Guideline SEE PCP WITHIN 3 DAYS: * You need to be seen within 2 or 3 days. CARE ADVICE given per Neurologic Deficit (Adult) guideline. CALL BACK IF: * Constant numbness or weakness in arms or legs * Problems  with bowel or bladder control * You become worse Comments User: Morene Antu, RN Date/Time (Eastern Time): 10/17/2020 12:10:27 PM This is the third day in a row. User: Morene Antu, RN Date/Time Dana [name] Time): 10/17/2020 12:20:45 PM The patient had a compleete workup by the neurologist for this situation and he placed her on a blood thinner and her PCP gave her gabapentin. This occurred within the past two wee ks. Referrals REFERRED TO PCP OFFICE

## 2020-10-19 NOTE — Telephone Encounter (Signed)
Mrs. Dana [name] called in due to she is having problems left leg and its just cold. And she is not sure if its her medication.  She called the neurologist and they told her she needed to get another referral.  And she goes to [facility]

## 2020-10-19 NOTE — Telephone Encounter (Signed)
Contacted pt and advised of apt today. Pt agreed to apt today. Scheduled for today and cancelled apt for tomorrow. Pt will be here at 415.

## 2020-10-19 NOTE — Telephone Encounter (Signed)
Recommend eval today - can she come in to see me today at 4:30pm?  If so may cancel appt with [facility] tomorrow.

## 2020-10-19 NOTE — Patient Instructions (Signed)
Unclear cause of these symptoms. Neurologic exam overall normal today which is reassuring.  Let us know if new symptoms develop like headache or weakness or numbness or dizziness.

## 2020-10-19 NOTE — Telephone Encounter (Signed)
Pt has apt with Dr. Lorelei Pont tomorrow.

## 2020-10-19 NOTE — Progress Notes (Signed)
Patient ID: Dana [name], female    DOB: March 09, 1946, 75 y.o.   MRN: [id number]  This visit was conducted in person.  BP 124/70   Pulse 69   Temp 98.2 F (36.8 C) (Temporal)   Ht '5\' 3"'  (1.6 m)   Wt 167 lb 3 oz (75.8 kg)   SpO2 97%   BMI 29.62 kg/m    CC: "I've been cold" cold left arm and leg, itchy hands Subjective:   HPI: Dana [name] is a 75 y.o. female presenting on 10/19/2020 for Pruritus (C/o bilateral itchy hands.  Started about 3 days ago. ) and Cold Extremity (C/o lower left leg  an left arm feeling cold.  Started 10/17/20.)   Recent hospitalization for acute L thalamic capsular infarct 08/29/2020 presenting with R sided weakness. Was evaluated at ER on 10/02/2020 with concern for TIA vs complicated migraine with aura, in known cerebral small vessel disease. Saw neurology in follow up 10/12/2020 - she never did recommended second course of DAPT so restarted on this (plavix + aspirin). Topamax continued for migraine prevention.   3d h/o itching to bilateral hands, worse in evenings/at night.  2d h/o lower left leg and arm cold feeling associated with tingling. Strange sensation feeling but when she touches skin it feels warm.  No new numbness or weakness.  No recent headaches.  No new fasciculations, cramping, tremor.   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  No new vitamins, supplements or other medications.  Has been on topamax for 2 years.      Relevant past medical, surgical, family and social history reviewed and updated as indicated. Interim medical history since our last visit reviewed. Allergies and medications reviewed and updated. Outpatient Medications Prior to Visit  Medication Sig Dispense Refill   acetaminophen (TYLENOL) 325 MG tablet Take 1 tablet (325 mg total) by mouth every 6 (six) hours as needed for mild pain.     Alcohol Swabs (B-D SINGLE USE SWABS REGULAR) PADS Check blood sugar once daily 100 each 0   aspirin 81 MG tablet  Take 81 mg by mouth daily.     atorvastatin (LIPITOR) 40 MG tablet TAKE 1 TABLET (40 MG TOTAL) BY MOUTH DAILY. (Patient taking differently: Take 40 mg by mouth at bedtime.) 90 tablet 0   Blood Glucose Calibration (TRUE METRIX LEVEL 1) Low SOLN Use to check meter 1 each 0   Blood Glucose Monitoring Suppl (TRUE METRIX METER) w/Device KIT Check blood sugar once daily 1 kit 0   butalbital-acetaminophen-caffeine (FIORICET) 50-325-40 MG tablet Take 1 tablet by mouth every 6 (six) hours as needed for headache. 10 tablet 1   clopidogrel (PLAVIX) 75 MG tablet Take 1 tablet (75 mg total) by mouth daily. 30 tablet 0   co-enzyme Q-10 30 MG capsule Take 30 mg by mouth at bedtime.     fluticasone (FLONASE) 50 MCG/ACT nasal spray USE 2 SPRAYS IN EACH NOSTRIL EVERY DAY (Patient taking differently: Place 2 sprays into both nostrils at bedtime.) 48 g 0   gabapentin (NEURONTIN) 100 MG capsule Take 1-2 capsules (100-200 mg total) by mouth at bedtime as needed. (Patient taking differently: Take 100-200 mg by mouth at bedtime.) 60 capsule 1   glucose blood (TRUE METRIX BLOOD GLUCOSE TEST) test strip Check blood sugar once daily 100 each 3   lactase (LACTAID) 3000 units tablet Take 2 tablets (6,000 Units total) by mouth 3 (three) times daily with meals. (Patient taking differently: Take 6,000 Units by  mouth See admin instructions. Take 6,000 units (2 tablets) by mouth up to three times a day WITH DAIRY PRODUCTS)     methocarbamol (ROBAXIN) 500 MG tablet TAKE 1 TABLET (500 MG TOTAL) BY MOUTH 3 (THREE) TIMES DAILY AS NEEDED FOR MUSCLE SPASMS (SEDATION PRECAUTIONS). (Patient taking differently: Take 500 mg by mouth 3 (three) times daily as needed for muscle spasms (or migraines- sedation precautions).) 60 tablet 1   Multiple Vitamin (MULTIVITAMIN) tablet Take 1 tablet by mouth at bedtime.     omeprazole (PRILOSEC) 20 MG capsule Take 1 capsule (20 mg total) by mouth daily. (Patient taking differently: Take 20 mg by mouth daily  before breakfast.) 90 capsule 3   ondansetron (ZOFRAN ODT) 4 MG disintegrating tablet Take 1 tablet (4 mg total) by mouth every 8 (eight) hours as needed. (Patient taking differently: Take 4 mg by mouth every 8 (eight) hours as needed for vomiting or nausea (dissolve orally).) 20 tablet 3   THERATEARS PF 0.25 % SOLN Place 2 drops into both eyes in the morning.     topiramate (TOPAMAX) 25 MG tablet Take 3 tablets (75 mg total) by mouth at bedtime. (Patient taking differently: Take 25-50 mg by mouth See admin instructions. Take 25 mg by mouth in the morning and 50 mg at bedtime) 270 tablet 4   triamcinolone cream (KENALOG) 0.1 % APPLY TO AFFECTED AREA TWICE A DAY (Patient taking differently: Apply 1 application topically 2 (two) times daily as needed (to affected areas under the breasts- for irritation).) 80 g 1   TRUEplus Lancets 33G MISC Check blood sugar once daily 100 each 3   vitamin B-12 (CYANOCOBALAMIN) 1000 MCG tablet Take 1 tablet (1,000 mcg total) by mouth every Monday, Wednesday, and Friday.     No facility-administered medications prior to visit.     Per HPI unless specifically indicated in ROS section below Review of Systems Objective:  BP 124/70   Pulse 69   Temp 98.2 F (36.8 C) (Temporal)   Ht '5\' 3"'  (1.6 m)   Wt 167 lb 3 oz (75.8 kg)   SpO2 97%   BMI 29.62 kg/m   Wt Readings from Last 3 Encounters:  10/19/20 167 lb 3 oz (75.8 kg)  10/12/20 167 lb (75.8 kg)  10/06/20 166 lb (75.3 kg)      Physical Exam Vitals and nursing note reviewed.  Constitutional:      Appearance: Normal appearance. She is not ill-appearing.     Comments: Ambulates with walker  HENT:     Head: Normocephalic and atraumatic.  Eyes:     Extraocular Movements: Extraocular movements intact.     Conjunctiva/sclera: Conjunctivae normal.     Pupils: Pupils are equal, round, and reactive to light.  Neck:     Vascular: No carotid bruit.  Cardiovascular:     Rate and Rhythm: Normal rate and regular  rhythm.     Pulses: Normal pulses.     Heart sounds: Normal heart sounds. No murmur heard. Pulmonary:     Effort: Pulmonary effort is normal. No respiratory distress.     Breath sounds: Normal breath sounds. No wheezing or rhonchi.  Musculoskeletal:     Cervical back: Normal range of motion and neck supple. No rigidity.     Right lower leg: No edema.     Left lower leg: No edema.     Comments: 2+ DP/PT bilaterally  Lymphadenopathy:     Cervical: No cervical adenopathy.  Skin:    General: Skin is  warm and dry.     Findings: No rash.  Neurological:     General: No focal deficit present.     Mental Status: She is alert and oriented to person, place, and time.     Cranial Nerves: No cranial nerve deficit.     Sensory: Sensation is intact.     Motor: Motor function is intact.     Comments:  CN 2-12 intact FTN intact EOMI 5/5 strength BUE, BLE Grip strength intact Temperature of skin equal bilaterally upper and lower extremities  Psychiatric:        Mood and Affect: Mood normal.        Behavior: Behavior normal.      Lab Results  Component Value Date   TSH 1.97 09/20/2018    Lab Results  Component Value Date   VITAMINB12 692 09/21/2020   No results found for: FOLATE  Lab Results  Component Value Date   ALT 30 10/02/2020   AST 21 10/02/2020   ALKPHOS 71 10/02/2020   BILITOT 0.7 10/02/2020   Lab Results  Component Value Date   WBC 4.2 10/02/2020   HGB 12.6 10/02/2020   HCT 37.0 10/02/2020   MCV 95.5 10/02/2020   PLT 228 10/02/2020   Lab Results  Component Value Date   CREATININE 0.60 10/02/2020   BUN 13 10/02/2020   NA 145 10/02/2020   K 4.0 10/02/2020   CL 112 (H) 10/02/2020   CO2 25 10/02/2020   Assessment & Plan:  This visit occurred during the SARS-CoV-2 public health emergency.  Safety protocols were in place, including screening questions prior to the visit, additional usage of staff PPE, and extensive cleaning of exam room while observing appropriate  contact time as indicated for disinfecting solutions.   Problem List Items Addressed This Visit     Hyperlipidemia associated with type 2 diabetes mellitus ([place])    LDL above goal of <70. Discussed further medication in addition to statin. Will start zetia. Hesitant to increase atorvastatin dose due to already experiencing myalgias       Relevant Medications   ezetimibe (ZETIA) 10 MG tablet   Controlled diabetes mellitus type 2 with complications ([facility])    Chronic, remains diet controlled.        Small vessel disease, cerebrovascular   Relevant Medications   ezetimibe (ZETIA) 10 MG tablet   Hemiparesis due to recent stroke ([place])    Continues PT. Recently saw neurology.  Advised let us know if ongoing or new symptoms.        Relevant Medications   ezetimibe (ZETIA) 10 MG tablet   Itching of both hands - Primary    Of unclear cause. Benign exam. Check labwork tomorrow.        Relevant Orders   Comprehensive metabolic panel   TSH   Folate   Sensation of cold in lower extremity    Cold sensation to L upper and lower extremity of unclear cause. Strong pedal pulses points against arterial insufficiency. ?sequela from prior stroke - currently continues DAPT. No new neurological symptoms noted today.  She will return for further labwork tomorrow as lab is unavailable today.          Meds ordered this encounter  Medications   ezetimibe (ZETIA) 10 MG tablet    Sig: Take 1 tablet (10 mg total) by mouth daily.    Dispense:  30 tablet    Refill:  0   Orders Placed This Encounter  Procedures   Comprehensive  metabolic panel    Standing Status:   Future    Standing Expiration Date:   10/19/2021   TSH    Standing Status:   Future    Standing Expiration Date:   10/19/2021   Folate    Standing Status:   Future    Standing Expiration Date:   10/19/2021    Patient Instructions  Unclear cause of these symptoms. Neurologic exam overall normal today which is reassuring.  Let us  know if new symptoms develop like headache or weakness or numbness or dizziness.    Follow up plan: No follow-ups on file.  [name], MD

## 2020-10-20 ENCOUNTER — Other Ambulatory Visit: Payer: Self-pay

## 2020-10-20 ENCOUNTER — Ambulatory Visit: Payer: Medicare HMO | Admitting: Family Medicine

## 2020-10-20 ENCOUNTER — Other Ambulatory Visit (INDEPENDENT_AMBULATORY_CARE_PROVIDER_SITE_OTHER): Payer: Medicare HMO

## 2020-10-20 DIAGNOSIS — L299 Pruritus, unspecified: Secondary | ICD-10-CM | POA: Diagnosis not present

## 2020-10-20 DIAGNOSIS — R209 Unspecified disturbances of skin sensation: Secondary | ICD-10-CM | POA: Insufficient documentation

## 2020-10-20 LAB — COMPREHENSIVE METABOLIC PANEL
ALT: 16 U/L (ref 0–35)
AST: 12 U/L (ref 0–37)
Albumin: 4.4 g/dL (ref 3.5–5.2)
Alkaline Phosphatase: 67 U/L (ref 39–117)
BUN: 17 mg/dL (ref 6–23)
CO2: 27 mEq/L (ref 19–32)
Calcium: 9.5 mg/dL (ref 8.4–10.5)
Chloride: 109 mEq/L (ref 96–112)
Creatinine, Ser: 0.74 mg/dL (ref 0.40–1.20)
GFR: 80.09 mL/min (ref >60.00)
Glucose, Bld: 164 mg/dL — ABNORMAL HIGH (ref 70–99)
Potassium: 3.9 mEq/L (ref 3.5–5.1)
Sodium: 144 mEq/L (ref 135–145)
Total Bilirubin: 0.8 mg/dL (ref 0.2–1.2)
Total Protein: 7.2 g/dL (ref 6.0–8.3)

## 2020-10-20 LAB — FOLATE: Folate: 14.8 ng/mL (ref >5.9)

## 2020-10-20 LAB — TSH: TSH: 1.41 u[IU]/mL (ref 0.35–4.50)

## 2020-10-20 NOTE — Assessment & Plan Note (Signed)
Chronic, remains diet controlled.

## 2020-10-20 NOTE — Assessment & Plan Note (Addendum)
LDL above goal of <70. Discussed further medication in addition to statin. Will start zetia. Hesitant to increase atorvastatin dose due to already experiencing myalgias

## 2020-10-20 NOTE — Assessment & Plan Note (Addendum)
Cold sensation to L upper and lower extremity of unclear cause. Strong pedal pulses points against arterial insufficiency. ?sequela from prior stroke - currently continues DAPT. No new neurological symptoms noted today.  She will return for further labwork tomorrow as lab is unavailable today.

## 2020-10-20 NOTE — Assessment & Plan Note (Signed)
Of unclear cause. Benign exam. Check labwork tomorrow.

## 2020-10-20 NOTE — Assessment & Plan Note (Signed)
Continues PT. Recently saw neurology.  Advised let us know if ongoing or new symptoms.

## 2020-10-21 ENCOUNTER — Encounter: Payer: Medicare HMO | Admitting: Speech Pathology

## 2020-10-21 ENCOUNTER — Ambulatory Visit: Payer: Medicare HMO

## 2020-10-21 DIAGNOSIS — R2689 Other abnormalities of gait and mobility: Secondary | ICD-10-CM | POA: Diagnosis not present

## 2020-10-21 DIAGNOSIS — M6281 Muscle weakness (generalized): Secondary | ICD-10-CM | POA: Diagnosis not present

## 2020-10-21 DIAGNOSIS — R2681 Unsteadiness on feet: Secondary | ICD-10-CM

## 2020-10-21 DIAGNOSIS — R262 Difficulty in walking, not elsewhere classified: Secondary | ICD-10-CM | POA: Diagnosis not present

## 2020-10-21 DIAGNOSIS — R278 Other lack of coordination: Secondary | ICD-10-CM | POA: Diagnosis not present

## 2020-10-21 DIAGNOSIS — I69851 Hemiplegia and hemiparesis following other cerebrovascular disease affecting right dominant side: Secondary | ICD-10-CM | POA: Diagnosis not present

## 2020-10-21 MED ORDER — CLOPIDOGREL BISULFATE 75 MG PO TABS: 75.0000 mg | ORAL_TABLET | Freq: Every day | ORAL | 5 refills | Status: DC

## 2020-10-21 NOTE — Addendum Note (Signed)
Addended by: Suzzanne Cloud on: 10/21/2020 10:46 AM   Modules accepted: Orders

## 2020-10-21 NOTE — Therapy (Signed)
[facility] [address] [place], [place], [address] Phone: [phone]   Fax:  [phone]  Physical Therapy Treatment  Patient Details  Name: Dana [name] MRN: [id number] Date of Birth: Aug 10, 1945 Referring Provider (PT): Referred by: [name], NP. PCP is [name], MD   Encounter Date: 10/21/2020   PT End of Session - 10/21/20 1233     Visit Number 7    Number of Visits 17    Date for PT Re-Evaluation 12/15/20   POC for 8 weeks, Cert for 90 days   Authorization Type [employer] (10th Visit PN)    Authorization Time Period Auth: 09/16/2020 - 10/29/2020    Progress Note Due on Visit 10    PT Start Time 1232    PT Stop Time 1314    PT Time Calculation (min) 42 min    Equipment Utilized During Treatment Gait belt    Activity Tolerance Patient tolerated treatment well    Behavior During Therapy WFL for tasks assessed/performed             Past Medical History:  Diagnosis Date   Allergy    Anemia    Arthritis    Cataract    Controlled type 2 diabetes mellitus without complication, without long-term current use of insulin ([place])    Diverticulitis    History of chicken pox    Hyperlipidemia    Kidney stone    Lactose intolerance in adult    Migraine    Urge incontinence 04/29/2014    Past Surgical History:  Procedure Laterality Date   ABDOMINAL HYSTERECTOMY  2006   cervix remains, menorrhagia   [place]   [place]   right milk duct removed   CARPAL TUNNEL RELEASE Right 02/2017   CHOLECYSTECTOMY  1971   COLONOSCOPY  11/2005   diverticulosis o/w WNL Mikel Cella)   DEXA  2015   spine -0.4, hip -1.0 WNL   LITHOTRIPSY     LUMBAR EPIDURAL INJECTION Left 10/2017   transforaminal L L3/4 (Dr Maryjean Ka)   TUBAL LIGATION      There were no vitals filed for this visit.   Subjective Assessment - 10/21/20 1235     Subjective  Patient reports that she has been having a cold feeling in the LLE and LUE, went to MD. She saw PCP on Monday, they did bloodwork but no results at this time. No pain or falls to report.    Patient is accompained by: Family member   Husband [name])   Pertinent History Migraines, HLD, DM, GERD, Lactose Intolerance, Kidney Stones, Diverticulitis, Anemia, Incontinence    Limitations Sitting;Standing;Walking    How long can you walk comfortably? 10 minutes    Patient Stated Goals Walk without Assistance;    Currently in Pain? No/denies                               [facility] Adult PT Treatment/Exercise - 10/21/20 0001       Transfers   Transfers Sit to Stand;Stand to Sit    Sit to Stand 5: Supervision    Stand to Sit 5: Supervision      Ambulation/Gait   Ambulation/Gait Yes    Ambulation/Gait Assistance 5: Supervision    Ambulation/Gait Assistance Details Completed gait training with Bioness on R ant tib x 230 ft with cues for pt  to focus on R ankle DF. Patient continue to demo mild knee buckling, therefore added biomes to R quadriceps for improved knee control. Completed ambulated with bioness to R quad and R ant tin wit Improvements noted. Intermittent standings breaks during ambulation for adjustment to bioness on ant tib due to eversion noted, but able to correct. Continued cues provided for standing up tall and improved step length.    Ambulation Distance (Feet) 230 Feet   x 2   Assistive device Rollator    Gait Pattern Step-through pattern;Decreased arm swing - right;Decreased arm swing - left;Decreased step length - right;Decreased step length - left;Decreased stance time - right;Decreased hip/knee flexion - right;Decreased dorsiflexion - right;Decreased weight shift to right;Poor foot clearance - right    Ambulation Surface Level;Indoor      Modalities   Modalities Energy manager R Ant Tib and R  Statistician Action for neuro re-ed/gait training/strengthening with use of Bioness    Electrical Stimulation Parameters see tablet 1; quick fit electrodes    Electrical Stimulation Goals Strength;Neuromuscular facilitation                    PT Education - 10/21/20 1315     Education Details Bioness and Purpose of FES    Person(s) Educated Patient    Methods Explanation    Comprehension Verbalized understanding              PT Short Term Goals - 10/13/20 1020       PT SHORT TERM GOAL #1   Title Patient will be independent with initial HEP for balance/strength (All STGs Due: 10/14/20)    Baseline reports independence, completing approx 3-4x/week    Time 4    Period Weeks    Status Achieved    Target Date 10/14/20      PT SHORT TERM GOAL #2   Title Patient will improve gait speed to >/= 1.5 ft/sec with LRAD    Baseline 1.19 ft/sec w/ RW; 2.24 ft/sec    Time 4    Period Weeks    Status Achieved      PT SHORT TERM GOAL #3   Title Patient will improve Berg Balance to >/= 39/56 to indicate improved balance and reduced fall risk.    Baseline 34/56; 45/56    Time 4    Period Weeks    Status Achieved      PT SHORT TERM GOAL #4   Title Patient will improve TUG to </= 20 secs w/ LRAD to indicate reduced fall risk.    Baseline 23.56 secs; 14.57 secs w/ Rollator    Time 4    Period Weeks    Status Achieved      PT SHORT TERM GOAL #5   Title Patient will ambulate >/= 500 ft w/ LRAD and supervision for improved household mobility    Baseline 100 ft CGA; 460 ft supervision with rollator, 5/10 fatigue    Time 4    Period Weeks    Status Partially Met               PT Long Term Goals - 09/16/20 1743       PT LONG TERM GOAL #1   Title Patient will be independent with final HEP for balance/strength (All LTGs Due: 11/11/20)    Baseline no HEP established    Time 8    Period Weeks  Status New    Target Date 11/11/20      PT LONG  TERM GOAL #2   Title Patient will improve gait speed to >/= 2.0 ft/sec with LRAD    Baseline 1.19 ft/sec    Time 8    Period Weeks    Status New      PT LONG TERM GOAL #3   Title Patient will improve Berg Balance to >/= 45/56 to indicate improved balance and reduced fall risk    Baseline 34/56    Time 8    Period Weeks    Status New      PT LONG TERM GOAL #4   Title Patient will improve TUG to </= 15 secs w/ LRAD to indicate reduced fall risk    Baseline 23.56 secs    Time 8    Period Weeks    Status New      PT LONG TERM GOAL #5   Title Patient will improve 5xSTS to </= 12 secs to indicate reduced fall risk    Baseline 14.42 secs    Time 8    Period Weeks    Status New      Additional Long Term Goals   Additional Long Term Goals Yes      PT LONG TERM GOAL #6   Title Patient will demo ability to ascend/descend 12 stairs with single rail and supervision for improved ability to ambulate to second floor of home    Baseline not currently completing    Time 8    Period Weeks    Status New      PT LONG TERM GOAL #7   Title Patient will improve FOTO to >/= 65%    Baseline 49%    Time 8    Period Weeks    Status New                   Plan - 10/21/20 1707     Clinical Impression Statement Today's session focused on education regarding biomes, addressing for precautions and questions/concerns regarding Bioness. Patient agreeable to trial bioness, therefore rest of session focused on getting biomes set up today for R ant tib and R quadriceps. Pt demonstrate a great response with gait training with rollator - improved DF and no buckling noted with concurrent use of biomes. Will continued to use FES with activities and progress toward all LTGs.    Personal Factors and Comorbidities Comorbidity 3+    Comorbidities Migraines, HLD, DM, GERD, Lactose Intolerance, Kidney Stones, Diverticulitis, Anemia, Incontinence    Examination-Activity Limitations Bed Mobility;Reach  Overhead;Sit;Locomotion Level;Stand;Transfers;Stairs    Examination-Participation Restrictions Community Activity;Driving    Stability/Clinical Decision Making Stable/Uncomplicated    Rehab Potential Good    PT Frequency 2x / week    PT Duration 8 weeks    PT Treatment/Interventions ADLs/Self Care Home Management;Electrical Stimulation;Moist Heat;Cryotherapy;DME Instruction;Gait training;Stair training;Functional mobility training;Therapeutic activities;Balance training;Therapeutic exercise;Neuromuscular re-education;Patient/family education;Orthotic Fit/Training;Passive range of motion;Vestibular    PT Next Visit Plan Continue use of Bioness with gait. Potential to trial SPC with quad tip. Continue high level balance, weight shift activites, RLE strengthening.    Consulted and Agree with Plan of Care Patient;Family member/caregiver    Family Member Consulted Husband             Patient will benefit from skilled therapeutic intervention in order to improve the following deficits and impairments:  Abnormal gait, Decreased activity tolerance, Decreased endurance, Decreased knowledge of use of DME, Decreased range of  motion, Decreased strength, Impaired UE functional use, Difficulty walking, Decreased balance, Decreased safety awareness, Decreased coordination  Visit Diagnosis: Other abnormalities of gait and mobility  Muscle weakness (generalized)  Unsteadiness on feet  Difficulty in walking, not elsewhere classified     Problem List Patient Active Problem List   Diagnosis Date Noted   Itching of both hands 10/20/2020   Sensation of cold in lower extremity 10/20/2020   Dysesthesia 09/21/2020   Hemiparesis due to recent stroke ([place]) 08/30/2020   Right sided weakness 08/29/2020   Pain in lateral portion of right ankle 07/20/2020   Plantar fasciitis of right foot 05/19/2020   Small vessel disease, cerebrovascular    Overactive bladder 10/24/2018   Amnesia memory loss 05/19/2017    Overweight (BMI 25.0-29.9) 05/09/2017   Lumbar herniated disc 02/01/2017   Medicare annual wellness visit, subsequent 05/03/2016   Lactose intolerance in adult    History of kidney stones    Cervical pain (neck) 12/04/2015   Health maintenance examination 04/29/2014   Paresthesia 04/29/2014   Advanced care planning/counseling discussion 04/29/2014   PFD (pelvic floor dysfunction) 04/29/2014   Controlled diabetes mellitus type 2 with complications ([place]) 06/84/0335   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   Hx of diverticulitis of colon 11/07/2012   Allergic rhinitis 11/07/2012   Hyperlipidemia associated with type 2 diabetes mellitus ([place]) 11/07/2012   Migraine 11/07/2012   Hereditary and idiopathic peripheral neuropathy 11/07/2012   LGSIL Pap smear of vaginal cuff 11/07/2012   GERD (gastroesophageal reflux disease) 11/07/2012   Family history of premature coronary heart disease 11/07/2012    Jones Bales, PT, DPT 10/21/2020, 5:10 PM  [facility] [address] [place], [place], [address] Phone: [phone]   Fax:  [phone]  Name: Dana [name] MRN: [id number] Date of Birth: 12-09-1945

## 2020-10-27 ENCOUNTER — Ambulatory Visit: Payer: Medicare HMO

## 2020-10-27 ENCOUNTER — Ambulatory Visit: Payer: Medicare HMO | Admitting: Occupational Therapy

## 2020-10-27 ENCOUNTER — Other Ambulatory Visit: Payer: Self-pay

## 2020-10-27 ENCOUNTER — Encounter: Payer: Self-pay | Admitting: Occupational Therapy

## 2020-10-27 DIAGNOSIS — R278 Other lack of coordination: Secondary | ICD-10-CM

## 2020-10-27 DIAGNOSIS — I69851 Hemiplegia and hemiparesis following other cerebrovascular disease affecting right dominant side: Secondary | ICD-10-CM

## 2020-10-27 DIAGNOSIS — R2681 Unsteadiness on feet: Secondary | ICD-10-CM

## 2020-10-27 DIAGNOSIS — M6281 Muscle weakness (generalized): Secondary | ICD-10-CM | POA: Diagnosis not present

## 2020-10-27 DIAGNOSIS — R2689 Other abnormalities of gait and mobility: Secondary | ICD-10-CM | POA: Diagnosis not present

## 2020-10-27 DIAGNOSIS — R262 Difficulty in walking, not elsewhere classified: Secondary | ICD-10-CM

## 2020-10-27 NOTE — Therapy (Signed)
[facility] [address] [place], [place], [address] Phone: [phone]   Fax:  [phone]  Occupational Therapy Treatment/Goal Check All STGs met Patient Details  Name: Dana [name] MRN: [id number] Date of Birth: 04/12/1946 Referring Provider (OT): [name] MD - follows up with Butler [facility]   Encounter Date: 10/27/2020   OT End of Session - 10/27/20 1536     Visit Number 5    Number of Visits 13    Date for OT Re-Evaluation 12/01/20    Authorization Type [employer]    Authorization Time Period VL:[place] $20 copay per day    Authorization - Visit Number 5   corrected count   Authorization - Number of Visits 10    OT Start Time 1536    OT Stop Time 6222    OT Time Calculation (min) 39 min    Activity Tolerance Patient tolerated treatment well    Behavior During Therapy [facility] for tasks assessed/performed             Past Medical History:  Diagnosis Date   Allergy    Anemia    Arthritis    Cataract    Controlled type 2 diabetes mellitus without complication, without long-term current use of insulin ([place])    Diverticulitis    History of chicken pox    Hyperlipidemia    Kidney stone    Lactose intolerance in adult    Migraine    Urge incontinence 04/29/2014    Past Surgical History:  Procedure Laterality Date   ABDOMINAL HYSTERECTOMY  2006   cervix remains, menorrhagia   [place]   right milk duct removed   CARPAL TUNNEL RELEASE Right 02/2017   CHOLECYSTECTOMY  1971   COLONOSCOPY  11/2005   diverticulosis o/w WNL Mikel Cella)   DEXA  2015   spine -0.4, hip -1.0 WNL   LITHOTRIPSY     LUMBAR EPIDURAL INJECTION Left 10/2017   transforaminal L L3/4 (Dr Maryjean Ka)   TUBAL LIGATION      There were no vitals filed for this visit.   Assessed STGs. All Met.  Hand Gripper: with RUE on level 2 with black spring. Pt picked up  1 inch blocks with gripper with min  drops and min difficulty.  ADLs Reports making chicken fajitas, baked potatoes and salad, soups/sandwiches - pt working hard to move towards healthier and non friend dinner choices.   Yellow Theraband.  Arm Bike: for conditioning and reciprocal movements on Level 1 for 6 minutes (forward and backward)                    OT Education - 10/27/20 1557     Education Details yellow theraband - see pt instructions    Person(s) Educated Patient    Methods Explanation;Demonstration;Verbal cues;Handout    Comprehension Returned demonstration;Verbal cues required;Verbalized understanding              OT Short Term Goals - 10/27/20 1537       OT SHORT TERM GOAL #1   Title Pt will be independent with HEP or coordination and grip strength in RUE    Time 3    Period Weeks    Status Achieved   issued coordination HEP 10/08/20   Target Date 10/27/20      OT SHORT TERM GOAL #2   Title Pt will write a  sentence with 90% legibility with use of adapted strategies and/or equipment PRN.    Baseline 50% of prior level of handwriting    Time 3    Period Weeks    Status Achieved   100%     OT SHORT TERM GOAL #3   Title Pt will increased fine motor coordination in RUE by completing 9 hole peg test in 27 seconds or less.    Baseline RUE 30, LUE 20.1    Time 3    Period Weeks    Status Achieved   10/19/20 - 24 sec w/ RUE     OT SHORT TERM GOAL #4   Title Pt will safely navigate in kitchen with mod I with no LOB and with good walker management.    Time 3    Period Weeks    Status Achieved               OT Long Term Goals - 10/27/20 1604       OT LONG TERM GOAL #1   Title Pt will complete FOTO at discharge and score at least 70%    Baseline 57%    Time 8    Period Weeks    Status Revised      OT LONG TERM GOAL #2   Title Pt will be independent with any updated HEPs    Time 8    Period Weeks    Status On-going      OT LONG  TERM GOAL #3   Title Pt will improve grip strength in RUE by 5 lbs or more in order to increase strength in dominant hand.    Baseline R 28.6, L 35.7    Time 8    Period Weeks    Status On-going      OT LONG TERM GOAL #4   Title Pt will improve fine motor coordination in RUE by 5 seconds on 9 hole peg test    Baseline R 30s L 20.1    Time 8    Period Weeks    Status Achieved   10/19/20 - 24 sec RUE     OT LONG TERM GOAL #5   Title Pt will plan and execute a simple meal for she and her spouse with min assistance from spouse.    Time 8    Period Weeks    Status On-going                   Plan - 10/27/20 1603     Clinical Impression Statement Pt has met all STGs and is progressing towards unmet goals.    OT Occupational Profile and History Problem Focused Assessment - Including review of records relating to presenting problem    Occupational performance deficits (Please refer to evaluation for details): IADL's;ADL's;Leisure    Body Structure / Function / Physical Skills ADL;FMC;Coordination;Flexibility;IADL;ROM;UE functional use;Pain;Dexterity;Strength;Decreased knowledge of use of DME    Cognitive Skills Problem Solve    Rehab Potential Good    Clinical Decision Making Limited treatment options, no task modification necessary    Comorbidities Affecting Occupational Performance: None    Modification or Assistance to Complete Evaluation  No modification of tasks or assist necessary to complete eval    OT Frequency 2x / week    OT Duration 8 weeks   12 visits over 8 weeks to account for any missed visits or scheduling difficulties.   OT Treatment/Interventions Self-care/ADL training;Functional Furniture conservator/restorer;Therapeutic exercise;Cognitive remediation/compensation;Patient/family education;Energy conservation;Neuromuscular education;DME and/or AE  instruction;Therapeutic activities;Moist Heat    Plan continue to address grip strength    Consulted and Agree with Plan of Care  Patient             Patient will benefit from skilled therapeutic intervention in order to improve the following deficits and impairments:   Body Structure / Function / Physical Skills: ADL, FMC, Coordination, Flexibility, IADL, ROM, UE functional use, Pain, Dexterity, Strength, Decreased knowledge of use of DME Cognitive Skills: Problem Solve     Visit Diagnosis: Other abnormalities of gait and mobility  Unsteadiness on feet  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)  Muscle weakness (generalized)  Other lack of coordination    Problem List Patient Active Problem List   Diagnosis Date Noted   Itching of both hands 10/20/2020   Sensation of cold in lower extremity 10/20/2020   Dysesthesia 09/21/2020   Hemiparesis due to recent stroke ([place]) 08/30/2020   Right sided weakness 08/29/2020   Pain in lateral portion of right ankle 07/20/2020   Plantar fasciitis of right foot 05/19/2020   Small vessel disease, cerebrovascular    Overactive bladder 10/24/2018   Amnesia memory loss 05/19/2017   Overweight (BMI 25.0-29.9) 05/09/2017   Lumbar herniated disc 02/01/2017   Medicare annual wellness visit, subsequent 05/03/2016   Lactose intolerance in adult    History of kidney stones    Cervical pain (neck) 12/04/2015   Health maintenance examination 04/29/2014   Paresthesia 04/29/2014   Advanced care planning/counseling discussion 04/29/2014   PFD (pelvic floor dysfunction) 04/29/2014   Controlled diabetes mellitus type 2 with complications ([place]) 30/04/3798   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   Hx of diverticulitis of colon 11/07/2012   Allergic rhinitis 11/07/2012   Hyperlipidemia associated with type 2 diabetes mellitus ([place]) 11/07/2012   Migraine 11/07/2012   Hereditary and idiopathic peripheral neuropathy 11/07/2012   LGSIL Pap smear of vaginal cuff 11/07/2012   GERD (gastroesophageal reflux disease)  11/07/2012   Family history of premature coronary heart disease 11/07/2012    Zachery Conch MOT, OTR/L  10/27/2020, 4:31 PM  [facility] [address] [place], [place], [address] Phone: [phone]   Fax:  [phone]  Name: Dana [name] MRN: [id number] Date of Birth: 02/03/1946

## 2020-10-27 NOTE — Therapy (Signed)
[facility] [address] [place], [place], [address] Phone: [phone]   Fax:  [phone]  Physical Therapy Treatment  Patient Details  Name: Dana [name] MRN: [id number] Date of Birth: 1945/05/27 Referring Provider (PT): Referred by: [name], NP. PCP is [name], MD   Encounter Date: 10/27/2020   PT End of Session - 10/27/20 1448     Visit Number 8    Number of Visits 17    Date for PT Re-Evaluation 12/15/20   POC for 8 weeks, Cert for 90 days   Authorization Type [employer] (10th Visit PN)    Authorization Time Period Auth: 09/16/2020 - 10/29/2020    Progress Note Due on Visit 10    PT Start Time 1446    PT Stop Time 1528    PT Time Calculation (min) 42 min    Equipment Utilized During Treatment Gait belt    Activity Tolerance Patient tolerated treatment well    Behavior During Therapy WFL for tasks assessed/performed             Past Medical History:  Diagnosis Date   Allergy    Anemia    Arthritis    Cataract    Controlled type 2 diabetes mellitus without complication, without long-term current use of insulin ([place])    Diverticulitis    History of chicken pox    Hyperlipidemia    Kidney stone    Lactose intolerance in adult    Migraine    Urge incontinence 04/29/2014    Past Surgical History:  Procedure Laterality Date   ABDOMINAL HYSTERECTOMY  2006   cervix remains, menorrhagia   [place]   [place]   right milk duct removed   CARPAL TUNNEL RELEASE Right 02/2017   CHOLECYSTECTOMY  1971   COLONOSCOPY  11/2005   diverticulosis o/w WNL Mikel Cella)   DEXA  2015   spine -0.4, hip -1.0 WNL   LITHOTRIPSY     LUMBAR EPIDURAL INJECTION Left 10/2017   transforaminal L L3/4 (Dr Maryjean Ka)   TUBAL LIGATION      There were no vitals filed for this visit.   Subjective Assessment - 10/27/20 1449     Subjective  Patient reports no new changes/complaints. Denies pain or falls. Reports feeling tired today due to having a busy morning.    Patient is accompained by: Family member   Husband [name])   Pertinent History Migraines, HLD, DM, GERD, Lactose Intolerance, Kidney Stones, Diverticulitis, Anemia, Incontinence    Limitations Sitting;Standing;Walking    How long can you walk comfortably? 10 minutes    Patient Stated Goals Walk without Assistance;    Currently in Pain? No/denies              [facility] Adult PT Treatment/Exercise - 10/27/20 0001       Transfers   Transfers Sit to Stand;Stand to Sit    Sit to Stand 5: Supervision    Stand to Sit 5: Supervision      Ambulation/Gait   Ambulation/Gait Yes    Ambulation/Gait Assistance 5: Supervision    Ambulation/Gait Assistance Details Completed gait training with Bioness on R Ant tib and R Quadriceps x 230 ft with rolling walker. Patient demonstrating improved gait pattern and no buckling. Then completed ambulation with bioness and progressed to [facility] with quad tip x 115 ft. Cues required for sequencing. Continued cues provided for standing up tall  and improved step length.    Ambulation Distance (Feet) 230 Feet   x 1, 115 x 1   Assistive device Rolling walker;Straight cane   w/ quad tip   Gait Pattern Step-through pattern;Decreased arm swing - right;Decreased arm swing - left;Decreased step length - right;Decreased step length - left;Decreased stance time - right;Decreased hip/knee flexion - right;Decreased dorsiflexion - right;Decreased weight shift to right;Poor foot clearance - right    Ambulation Surface Level;Indoor      Neuro Re-ed    Neuro Re-ed Details  standing at RW, but no UE support utilized, completed concurrent with bioness, steps forward and weight shift onto RLE  working on improved stance and posture with completion x 10 reps, CGA required.      Knee/Hip Exercises: Supine   Short Arc Quad Sets Strengthening;Right;10  reps;Limitations;1 set    [employer] Sets Limitations completed with concurrent Bioness to R Quadriceps, rest break required between sets.    Straight Leg Raises Strengthening;Right;2 sets;10 reps;Limitations    Straight Leg Raises Limitations completed with concurrent Bioness to R Quadriceps, rest break required between sets      Acupuncturist Location R Ant Tib and R Quadriceps    Electrical Stimulation Action for neuro re-ed/gait training/strengthening with use of Bioness    Electrical Stimulation Parameters see tablet 1; quick fit electrodes    Electrical Stimulation Goals Strength;Neuromuscular facilitation                 PT Short Term Goals - 10/13/20 1020       PT SHORT TERM GOAL #1   Title Patient will be independent with initial HEP for balance/strength (All STGs Due: 10/14/20)    Baseline reports independence, completing approx 3-4x/week    Time 4    Period Weeks    Status Achieved    Target Date 10/14/20      PT SHORT TERM GOAL #2   Title Patient will improve gait speed to >/= 1.5 ft/sec with LRAD    Baseline 1.19 ft/sec w/ RW; 2.24 ft/sec    Time 4    Period Weeks    Status Achieved      PT SHORT TERM GOAL #3   Title Patient will improve Berg Balance to >/= 39/56 to indicate improved balance and reduced fall risk.    Baseline 34/56; 45/56    Time 4    Period Weeks    Status Achieved      PT SHORT TERM GOAL #4   Title Patient will improve TUG to </= 20 secs w/ LRAD to indicate reduced fall risk.    Baseline 23.56 secs; 14.57 secs w/ Rollator    Time 4    Period Weeks    Status Achieved      PT SHORT TERM GOAL #5   Title Patient will ambulate >/= 500 ft w/ LRAD and supervision for improved household mobility    Baseline 100 ft CGA; 460 ft supervision with rollator, 5/10 fatigue    Time 4    Period Weeks    Status Partially Met               PT Long Term Goals - 09/16/20 1743       PT LONG TERM GOAL #1    Title Patient will be independent with final HEP for balance/strength (All LTGs Due: 11/11/20)    Baseline no HEP established    Time 8    Period Weeks  Status New    Target Date 11/11/20      PT LONG TERM GOAL #2   Title Patient will improve gait speed to >/= 2.0 ft/sec with LRAD    Baseline 1.19 ft/sec    Time 8    Period Weeks    Status New      PT LONG TERM GOAL #3   Title Patient will improve Berg Balance to >/= 45/56 to indicate improved balance and reduced fall risk    Baseline 34/56    Time 8    Period Weeks    Status New      PT LONG TERM GOAL #4   Title Patient will improve TUG to </= 15 secs w/ LRAD to indicate reduced fall risk    Baseline 23.56 secs    Time 8    Period Weeks    Status New      PT LONG TERM GOAL #5   Title Patient will improve 5xSTS to </= 12 secs to indicate reduced fall risk    Baseline 14.42 secs    Time 8    Period Weeks    Status New      Additional Long Term Goals   Additional Long Term Goals Yes      PT LONG TERM GOAL #6   Title Patient will demo ability to ascend/descend 12 stairs with single rail and supervision for improved ability to ambulate to second floor of home    Baseline not currently completing    Time 8    Period Weeks    Status New      PT LONG TERM GOAL #7   Title Patient will improve FOTO to >/= 65%    Baseline 49%    Time 8    Period Weeks    Status New                   Plan - 10/27/20 1621     Clinical Impression Statement Today's session focused on continued use of bioness with gait training and strengthening activities for RLE. Able to tolerate progression to [facility] today with cues required for proper sequencing and CGA. Patient tolerating supine strengthening with concurrent bioness well, intermittent rest breaks. Will continue to progress toward all LTGs.    Personal Factors and Comorbidities Comorbidity 3+    Comorbidities Migraines, HLD, DM, GERD, Lactose Intolerance, Kidney Stones,  Diverticulitis, Anemia, Incontinence    Examination-Activity Limitations Bed Mobility;Reach Overhead;Sit;Locomotion Level;Stand;Transfers;Stairs    Examination-Participation Restrictions Community Activity;Driving    Stability/Clinical Decision Making Stable/Uncomplicated    Rehab Potential Good    PT Frequency 2x / week    PT Duration 8 weeks    PT Treatment/Interventions ADLs/Self Care Home Management;Electrical Stimulation;Moist Heat;Cryotherapy;DME Instruction;Gait training;Stair training;Functional mobility training;Therapeutic activities;Balance training;Therapeutic exercise;Neuromuscular re-education;Patient/family education;Orthotic Fit/Training;Passive range of motion;Vestibular    PT Next Visit Plan Continue use of Bioness with gait. continue working with [facility] with quad tip. Continue high level balance, weight shift activites, RLE strengthening.    Consulted and Agree with Plan of Care Patient;Family member/caregiver    Family Member Consulted Husband             Patient will benefit from skilled therapeutic intervention in order to improve the following deficits and impairments:  Abnormal gait, Decreased activity tolerance, Decreased endurance, Decreased knowledge of use of DME, Decreased range of motion, Decreased strength, Impaired UE functional use, Difficulty walking, Decreased balance, Decreased safety awareness, Decreased coordination  Visit Diagnosis: Other abnormalities of gait  and mobility  Muscle weakness (generalized)  Unsteadiness on feet  Difficulty in walking, not elsewhere classified     Problem List Patient Active Problem List   Diagnosis Date Noted   Itching of both hands 10/20/2020   Sensation of cold in lower extremity 10/20/2020   Dysesthesia 09/21/2020   Hemiparesis due to recent stroke ([place]) 08/30/2020   Right sided weakness 08/29/2020   Pain in lateral portion of right ankle 07/20/2020   Plantar fasciitis of right foot 05/19/2020   Small  vessel disease, cerebrovascular    Overactive bladder 10/24/2018   Amnesia memory loss 05/19/2017   Overweight (BMI 25.0-29.9) 05/09/2017   Lumbar herniated disc 02/01/2017   Medicare annual wellness visit, subsequent 05/03/2016   Lactose intolerance in adult    History of kidney stones    Cervical pain (neck) 12/04/2015   Health maintenance examination 04/29/2014   Paresthesia 04/29/2014   Advanced care planning/counseling discussion 04/29/2014   PFD (pelvic floor dysfunction) 04/29/2014   Controlled diabetes mellitus type 2 with complications ([place]) 60/45/4098   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   Hx of diverticulitis of colon 11/07/2012   Allergic rhinitis 11/07/2012   Hyperlipidemia associated with type 2 diabetes mellitus ([place]) 11/07/2012   Migraine 11/07/2012   Hereditary and idiopathic peripheral neuropathy 11/07/2012   LGSIL Pap smear of vaginal cuff 11/07/2012   GERD (gastroesophageal reflux disease) 11/07/2012   Family history of premature coronary heart disease 11/07/2012    Jones Bales, PT, DPT 10/27/2020, 4:23 PM  [facility] [address] [place], [place], [address] Phone: [phone]   Fax:  [phone]  Name: Dana [name] MRN: [id number] Date of Birth: 1945/06/14

## 2020-10-27 NOTE — Patient Instructions (Signed)
Access Code: [email or web address] URL: [email or web address] Date: 10/27/2020 Prepared by: Waldo Laine  Exercises Standing Elbow Extension with Self-Anchored Resistance - 1 x daily - 7 x weekly - 3 sets - 10 reps Standing Elbow Flexion with Self-Anchored Resistance - 1 x daily - 7 x weekly - 3 sets - 10 reps Standing Shoulder Horizontal Abduction with Resistance - 1 x daily - 7 x weekly - 3 sets - 10 reps Standing Shoulder Flexion with Resistance - 1 x daily - 7 x weekly - 3 sets - 10 reps Standing Shoulder Diagonal Horizontal Abduction 60/120 Degrees with Resistance - 1 x daily - 7 x weekly - 3 sets - 10 reps Standing Single Arm Shoulder Abduction with Resistance - 1 x daily - 7 x weekly - 3 sets - 10 reps Single Arm Punch with Resistance - 1 x daily - 7 x weekly - 3 sets - 10 reps

## 2020-10-29 ENCOUNTER — Ambulatory Visit: Payer: Medicare HMO | Admitting: Occupational Therapy

## 2020-10-29 ENCOUNTER — Ambulatory Visit: Payer: Medicare HMO

## 2020-10-29 ENCOUNTER — Other Ambulatory Visit: Payer: Self-pay

## 2020-10-29 ENCOUNTER — Encounter: Payer: Self-pay | Admitting: Occupational Therapy

## 2020-10-29 DIAGNOSIS — M6281 Muscle weakness (generalized): Secondary | ICD-10-CM

## 2020-10-29 DIAGNOSIS — R262 Difficulty in walking, not elsewhere classified: Secondary | ICD-10-CM

## 2020-10-29 DIAGNOSIS — R278 Other lack of coordination: Secondary | ICD-10-CM

## 2020-10-29 DIAGNOSIS — I69851 Hemiplegia and hemiparesis following other cerebrovascular disease affecting right dominant side: Secondary | ICD-10-CM | POA: Diagnosis not present

## 2020-10-29 DIAGNOSIS — R2689 Other abnormalities of gait and mobility: Secondary | ICD-10-CM

## 2020-10-29 DIAGNOSIS — R2681 Unsteadiness on feet: Secondary | ICD-10-CM

## 2020-10-29 NOTE — Patient Instructions (Signed)
Access Code: [email or web address] URL: [email or web address] Date: 10/29/2020 Prepared by: Baldomero Lamy  Exercises Supine Bridge - 1 x daily - 5 x weekly - 2 sets - 10 reps Sit to Stand with Arms Crossed - 1 x daily - 5 x weekly - 2 sets - 10 reps Romberg Stance with Eyes Closed - 1 x daily - 5 x weekly - 1 sets - 3 reps - 30 hold Side Stepping with Counter Support - 1 x daily - 5 x weekly - 1 sets - 4 reps Seated Hamstring Stretch - 1 x daily - 5 x weekly - 1 sets - 3 reps - 30 seconds hold Supine Single Knee to Chest Stretch - 1 x daily - 5 x weekly - 1 sets - 3 reps - 30 seconds hold Standing Hip Abduction with Counter Support - 1 x daily - 7 x weekly - 3 sets - 10 reps Standing March with Counter Support - 1 x daily - 7 x weekly - 3 sets - 10 reps

## 2020-10-29 NOTE — Therapy (Signed)
[facility] [address] [place], [place], [address] Phone: [phone]   Fax:  [phone]  Occupational Therapy Treatment  Patient Details  Name: Dana [name] MRN: [id number] Date of Birth: 03-01-1946 Referring Provider (OT): [name] MD - follows up with Butler Denmark   Encounter Date: 10/29/2020   OT End of Session - 10/29/20 1234     Visit Number 6    Number of Visits 13    Date for OT Re-Evaluation 12/01/20    Authorization Type [employer] Medicare    Authorization Time Period VL:[place] $20 copay per day    Authorization - Visit Number 6   corrected count   Authorization - Number of Visits 10    OT Start Time 1230    OT Stop Time 1315    OT Time Calculation (min) 45 min    Activity Tolerance Patient tolerated treatment well    Behavior During Therapy [facility] for tasks assessed/performed             Past Medical History:  Diagnosis Date   Allergy    Anemia    Arthritis    Cataract    Controlled type 2 diabetes mellitus without complication, without long-term current use of insulin ([place])    Diverticulitis    History of chicken pox    Hyperlipidemia    Kidney stone    Lactose intolerance in adult    Migraine    Urge incontinence 04/29/2014    Past Surgical History:  Procedure Laterality Date   ABDOMINAL HYSTERECTOMY  2006   cervix remains, menorrhagia   [place]   right milk duct removed   CARPAL TUNNEL RELEASE Right 02/2017   CHOLECYSTECTOMY  1971   COLONOSCOPY  11/2005   diverticulosis o/w WNL Mikel Cella)   DEXA  2015   spine -0.4, hip -1.0 WNL   LITHOTRIPSY     LUMBAR EPIDURAL INJECTION Left 10/2017   transforaminal L L3/4 (Dr Maryjean Ka)   TUBAL LIGATION      There were no vitals filed for this visit.   Subjective Assessment - 10/29/20 1230     Subjective  Pt reports she tried air drying her hair a bit to conserve  energy and it went well.    Pertinent History Migraines, HLD, DM, GERD, Lactose Intolerance, Kidney Stones, Diverticulitis, Anemia, Incontinence    Limitations no driving. fall risk.    Patient Stated Goals "a lot of trouble with fine motor skills" "writing"    Currently in Pain? No/denies             Grooved Pegs with RUE with no difficulty and good coordination. Upgraded to placing 3 in palm and transitioning one at a time to fingertips and placing in pegboard. Pt with min drops and min difficulty with upgrade. Removed with in hand manipulation.  Hand Gripper: with RUE on level 2 with black spring. Pt picked up 1 inch blocks with gripper with min  drops and min difficulty.  Strengthening 2.2 lb medball x 12 reps: shoulder flex, press, chest press, circumduction, horizontal abudction/trunk twist, bicep curls  Resistance Clothespins 1-8# with RUE and placing on antenna for increase in sustained grasp, grip strength and shoulder range of motion/strength.  Meal Planning and Handwriting- legibility with handwriting with RUE 100% this day. Pt wrote down menu for a meal for her this weekend and a grocery list with 100%  accuracy.                OT Short Term Goals - 10/27/20 1537       OT SHORT TERM GOAL #1   Title Pt will be independent with HEP or coordination and grip strength in RUE    Time 3    Period Weeks    Status Achieved   issued coordination HEP 10/08/20   Target Date 10/27/20      OT SHORT TERM GOAL #2   Title Pt will write a sentence with 90% legibility with use of adapted strategies and/or equipment PRN.    Baseline 50% of prior level of handwriting    Time 3    Period Weeks    Status Achieved   100%     OT SHORT TERM GOAL #3   Title Pt will increased fine motor coordination in RUE by completing 9 hole peg test in 27 seconds or less.    Baseline RUE 30, LUE 20.1    Time 3    Period Weeks    Status Achieved   10/19/20 - 24 sec w/ RUE     OT SHORT TERM  GOAL #4   Title Pt will safely navigate in kitchen with mod I with no LOB and with good walker management.    Time 3    Period Weeks    Status Achieved               OT Long Term Goals - 10/27/20 1604       OT LONG TERM GOAL #1   Title Pt will complete FOTO at discharge and score at least 70%    Baseline 57%    Time 8    Period Weeks    Status Revised      OT LONG TERM GOAL #2   Title Pt will be independent with any updated HEPs    Time 8    Period Weeks    Status On-going      OT LONG TERM GOAL #3   Title Pt will improve grip strength in RUE by 5 lbs or more in order to increase strength in dominant hand.    Baseline R 28.6, L 35.7    Time 8    Period Weeks    Status On-going      OT LONG TERM GOAL #4   Title Pt will improve fine motor coordination in RUE by 5 seconds on 9 hole peg test    Baseline R 30s L 20.1    Time 8    Period Weeks    Status Achieved   10/19/20 - 24 sec RUE     OT LONG TERM GOAL #5   Title Pt will plan and execute a simple meal for she and her spouse with min assistance from spouse.    Time 8    Period Weeks    Status On-going                   Plan - 10/29/20 1243     Clinical Impression Statement Pt has met all STGs and is progressing towards unmet goals.    OT Occupational Profile and History Problem Focused Assessment - Including review of records relating to presenting problem    Occupational performance deficits (Please refer to evaluation for details): IADL's;ADL's;Leisure    Body Structure / Function / Physical Skills ADL;FMC;Coordination;Flexibility;IADL;ROM;UE functional use;Pain;Dexterity;Strength;Decreased knowledge of use of DME    Cognitive Skills Problem Solve  Rehab Potential Good    Clinical Decision Making Limited treatment options, no task modification necessary    Comorbidities Affecting Occupational Performance: None    Modification or Assistance to Complete Evaluation  No modification of tasks or  assist necessary to complete eval    OT Frequency 2x / week    OT Duration 8 weeks   12 visits over 8 weeks to account for any missed visits or scheduling difficulties.   OT Treatment/Interventions Self-care/ADL training;Functional Furniture conservator/restorer;Therapeutic exercise;Cognitive remediation/compensation;Patient/family education;Energy conservation;Neuromuscular education;DME and/or AE instruction;Therapeutic activities;Moist Heat    Plan continue to address grip strength    Consulted and Agree with Plan of Care Patient             Patient will benefit from skilled therapeutic intervention in order to improve the following deficits and impairments:   Body Structure / Function / Physical Skills: ADL, FMC, Coordination, Flexibility, IADL, ROM, UE functional use, Pain, Dexterity, Strength, Decreased knowledge of use of DME Cognitive Skills: Problem Solve     Visit Diagnosis: Other abnormalities of gait and mobility  Muscle weakness (generalized)  Unsteadiness on feet  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)  Other lack of coordination    Problem List Patient Active Problem List   Diagnosis Date Noted   Itching of both hands 10/20/2020   Sensation of cold in lower extremity 10/20/2020   Dysesthesia 09/21/2020   Hemiparesis due to recent stroke ([place]) 08/30/2020   Right sided weakness 08/29/2020   Pain in lateral portion of right ankle 07/20/2020   Plantar fasciitis of right foot 05/19/2020   Small vessel disease, cerebrovascular    Overactive bladder 10/24/2018   Amnesia memory loss 05/19/2017   Overweight (BMI 25.0-29.9) 05/09/2017   Lumbar herniated disc 02/01/2017   Medicare annual wellness visit, subsequent 05/03/2016   Lactose intolerance in adult    History of kidney stones    Cervical pain (neck) 12/04/2015   Health maintenance examination 04/29/2014   Paresthesia 04/29/2014   Advanced care  planning/counseling discussion 04/29/2014   PFD (pelvic floor dysfunction) 04/29/2014   Controlled diabetes mellitus type 2 with complications ([place]) 69/62/9528   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   Hx of diverticulitis of colon 11/07/2012   Allergic rhinitis 11/07/2012   Hyperlipidemia associated with type 2 diabetes mellitus ([place]) 11/07/2012   Migraine 11/07/2012   Hereditary and idiopathic peripheral neuropathy 11/07/2012   LGSIL Pap smear of vaginal cuff 11/07/2012   GERD (gastroesophageal reflux disease) 11/07/2012   Family history of premature coronary heart disease 11/07/2012    Zachery Conch MOT, OTR/L  10/29/2020, 12:51 PM  [facility] [address] [place], [place], [address] Phone: [phone]   Fax:  [phone]  Name: Dana [name] MRN: [id number] Date of Birth: 12-31-1945

## 2020-10-29 NOTE — Therapy (Signed)
[facility] [address] [place], [place], [address] Phone: [phone]   Fax:  [phone]  Physical Therapy Treatment/Progress Note/Re-Certification  Patient Details  Name: Dana [name] MRN: [id number] Date of Birth: 1946/05/05 Referring Provider (PT): Referred by: [name], NP. PCP is [name], MD  Physical Therapy Progress Note   Dates of Reporting Period: 09/16/2020 - 10/29/2020  See Note below for Objective Data and Assessment of Progress/Goals.  Thank you for the referral of this patient. Guillermina [place], PT, DPT   Encounter Date: 10/29/2020   PT End of Session - 10/29/20 1143     Visit Number 9    Number of Visits 21    Date for PT Re-Evaluation 12/15/20    Authorization Type [employer] (10th Visit PN)    Authorization Time Period Auth: 09/16/2020 - 10/29/2020; Awaiting New Authorization    Progress Note Due on Visit 19   completed on 9th visit   PT Start Time 1150   patient using bathroom prior to start of session   PT Stop Time 1230    PT Time Calculation (min) 40 min    Equipment Utilized During Treatment Gait belt    Activity Tolerance Patient tolerated treatment well    Behavior During Therapy WFL for tasks assessed/performed             Past Medical History:  Diagnosis Date   Allergy    Anemia    Arthritis    Cataract    Controlled type 2 diabetes mellitus without complication, without long-term current use of insulin ([place])    Diverticulitis    History of chicken pox    Hyperlipidemia    Kidney stone    Lactose intolerance in adult    Migraine    Urge incontinence 04/29/2014    Past Surgical History:  Procedure Laterality Date   ABDOMINAL HYSTERECTOMY  2006   cervix remains, menorrhagia   [place]   right milk duct removed   CARPAL TUNNEL RELEASE Right 02/2017   CHOLECYSTECTOMY  1971    COLONOSCOPY  11/2005   diverticulosis o/w WNL [facility])   DEXA  2015   spine -0.4, hip -1.0 WNL   LITHOTRIPSY     LUMBAR EPIDURAL INJECTION Left 10/2017   transforaminal L L3/4 (Dr Maryjean Ka)   TUBAL LIGATION      There were no vitals filed for this visit.   Subjective Assessment - 10/29/20 1152     Subjective Patient denies new changes/complaints. Reports feels like her walking is getting better at walking after use of the bioness. No falls.    Patient is accompained by: Family member   Husband [name])   Pertinent History Migraines, HLD, DM, GERD, Lactose Intolerance, Kidney Stones, Diverticulitis, Anemia, Incontinence    Limitations Sitting;Standing;Walking    How long can you walk comfortably? 10 minutes    Patient Stated Goals Walk without Assistance;    Currently in Pain? No/denies                [facility] PT Assessment - 10/29/20 0001       Assessment   Medical Diagnosis CVA (L [facility] Infarct)    Referring Provider (PT) Referred by: [name], NP. PCP is [name], MD      Transfers   Transfers Sit to Stand;Stand to Sit    Sit to Stand 6: Modified independent (Device/Increase time)  Five time sit to stand comments  10.41 secs w/ UE support on lap    Stand to Sit 6: Modified independent (Device/Increase time)      Ambulation/Gait   Ambulation/Gait Yes    Ambulation/Gait Assistance 5: Supervision    Ambulation/Gait Assistance Details continued gait with RW    Ambulation Distance (Feet) --   clinic distances   Assistive device Rolling walker    Gait Pattern Step-through pattern;Decreased arm swing - right;Decreased arm swing - left;Decreased step length - right;Decreased step length - left;Decreased stance time - right;Decreased hip/knee flexion - right;Decreased dorsiflexion - right;Decreased weight shift to right;Poor foot clearance - right    Ambulation Surface Level;Indoor    Gait velocity 13.31 secs = 2.46 ft/sec    Stairs Yes    Stairs Assistance  5: Supervision    Stairs Assistance Details (indicate cue type and reason) complete stairs at supervision level, patient able to ascend stairs with single rail and step to pattern, then descend with bilat rails and step to pattern. With cues patient able to dmeo proper alternating pattern with ascend but unable to complete with descent    Stair Management Technique Two rails;One rail Left;Alternating pattern;Step to pattern;Forwards    Number of Stairs 8    Height of Stairs 6      Standardized Balance Assessment   Standardized Balance Assessment Timed Up and Go Test;Berg Balance Test      Berg Balance Test   Sit to Stand Able to stand without using hands and stabilize independently    Standing Unsupported Able to stand safely 2 minutes    Sitting with Back Unsupported but Feet Supported on Floor or Stool Able to sit safely and securely 2 minutes    Stand to Sit Sits safely with minimal use of hands    Transfers Able to transfer safely, definite need of hands    Standing Unsupported with Eyes Closed Able to stand 10 seconds safely    Standing Unsupported with Feet Together Able to place feet together independently and stand 1 minute safely    From Standing, Reach Forward with Outstretched Arm Can reach confidently >25 cm (10")    From Standing Position, Pick up Object from Floor Able to pick up shoe safely and easily    From Standing Position, Turn to Look Behind Over each Shoulder Looks behind from both sides and weight shifts well    Turn 360 Degrees Able to turn 360 degrees safely but slowly    Standing Unsupported, Alternately Place Feet on Step/Stool Able to stand independently and complete 8 steps >20 seconds    Standing Unsupported, One Foot in Front Able to take small step independently and hold 30 seconds    Standing on One Leg Able to lift leg independently and hold equal to or more than 3 seconds    Total Score 48    Berg comment: 48/56      Timed Up and Go Test   TUG Normal TUG     Normal TUG (seconds) 14.13   w/ RW           Reviewed and Progressed HEP during session:  Access Code: [email or web address] URL: [email or web address] Date: 10/29/2020 Prepared by: Baldomero Lamy  Exercises Supine Bridge - 1 x daily - 5 x weekly - 2 sets - 10 reps Sit to Stand with Arms Crossed - 1 x daily - 5 x weekly - 2 sets - 10 reps Romberg Stance with Eyes Closed - 1 x daily -  5 x weekly - 1 sets - 3 reps - 30 hold Side Stepping with Counter Support - 1 x daily - 5 x weekly - 1 sets - 4 reps Seated Hamstring Stretch - 1 x daily - 5 x weekly - 1 sets - 3 reps - 30 seconds hold Supine Single Knee to Chest Stretch - 1 x daily - 5 x weekly - 1 sets - 3 reps - 30 seconds hold Standing Hip Abduction with Counter Support - 1 x daily - 7 x weekly - 3 sets - 10 reps Standing March with Counter Support - 1 x daily - 7 x weekly - 3 sets - 10 reps    PT Education - 10/29/20 1231     Education Details progress toward LTGs; updated POC, updated HEP    Person(s) Educated Patient    Methods Explanation;Demonstration;Handout    Comprehension Verbalized understanding;Returned demonstration              PT Short Term Goals - 10/13/20 1020       PT SHORT TERM GOAL #1   Title Patient will be independent with initial HEP for balance/strength (All STGs Due: 10/14/20)    Baseline reports independence, completing approx 3-4x/week    Time 4    Period Weeks    Status Achieved    Target Date 10/14/20      PT SHORT TERM GOAL #2   Title Patient will improve gait speed to >/= 1.5 ft/sec with LRAD    Baseline 1.19 ft/sec w/ RW; 2.24 ft/sec    Time 4    Period Weeks    Status Achieved      PT SHORT TERM GOAL #3   Title Patient will improve Berg Balance to >/= 39/56 to indicate improved balance and reduced fall risk.    Baseline 34/56; 45/56    Time 4    Period Weeks    Status Achieved      PT SHORT TERM GOAL #4   Title Patient will improve TUG to </= 20 secs w/ LRAD to  indicate reduced fall risk.    Baseline 23.56 secs; 14.57 secs w/ Rollator    Time 4    Period Weeks    Status Achieved      PT SHORT TERM GOAL #5   Title Patient will ambulate >/= 500 ft w/ LRAD and supervision for improved household mobility    Baseline 100 ft CGA; 460 ft supervision with rollator, 5/10 fatigue    Time 4    Period Weeks    Status Partially Met               PT Long Term Goals - 10/29/20 1153       PT LONG TERM GOAL #1   Title Patient will be independent with final HEP for balance/strength (All LTGs Due: 11/11/20)    Baseline no HEP established; reports independence, continue to progress HEP at this time, not completing daily    Time 8    Period Weeks    Status On-going      PT LONG TERM GOAL #2   Title Patient will improve gait speed to >/= 2.0 ft/sec with LRAD    Baseline 1.19 ft/sec, 2.42 ft/sec with RW    Time 8    Period Weeks    Status Achieved      PT LONG TERM GOAL #3   Title Patient will improve Berg Balance to >/= 45/56 to indicate improved balance and reduced fall  risk    Baseline 34/56, 48/56    Time 8    Period Weeks    Status Achieved      PT LONG TERM GOAL #4   Title Patient will improve TUG to </= 15 secs w/ LRAD to indicate reduced fall risk    Baseline 23.56 secs; 14.13 secs w/ RW    Time 8    Period Weeks    Status Achieved      PT LONG TERM GOAL #5   Title Patient will improve 5xSTS to </= 12 secs to indicate reduced fall risk    Baseline 14.42 secs; 10.41 secs    Time 8    Period Weeks    Status Achieved      PT LONG TERM GOAL #6   Title Patient will demo ability to ascend/descend 12 stairs with single rail and supervision for improved ability to ambulate to second floor of home    Baseline not currently completing; continue to use bilat rails, alternating ascend, step to descend supervision    Time 8    Period Weeks    Status Partially Met      PT LONG TERM GOAL #7   Title Patient will improve FOTO to >/= 65%     Baseline 49%; continue with PT, unable to capture due to time constraints    Time 8    Period Weeks    Status On-going            Updated Short Term Goals:    PT Short Term Goals - 10/29/20 1244       PT SHORT TERM GOAL #1   Title Patient will be able to ambulate x 250 ft with SPC vs No AD with superivison for improved household/community mobility    Baseline continue use of RW    Time 3    Period Weeks    Status New    Target Date 11/19/20             Updated Long Term Goals:    PT Long Term Goals - 10/29/20 1246       PT LONG TERM GOAL #1   Title Patient will be independent with final HEP for balance/strength and walking program (All LTGs Due: 12/15/20)    Baseline no HEP established; reports independence, continue to progress HEP at this time, not completing daily    Time 6    Period Weeks    Status On-going    Target Date 12/15/20      PT LONG TERM GOAL #2   Title Patient will improve gait speed to >/= 2.5 ft/sec with LRAD    Baseline 1.19 ft/sec, 2.42 ft/sec with RW    Time 6    Period Weeks    Status Revised      PT LONG TERM GOAL #3   Title Patient will improve Berg Balance to >/= 53/56 to indicate improved balance and reduced fall risk    Baseline 34/56, 48/56    Time 6    Period Weeks    Status Revised      PT LONG TERM GOAL #4   Title Patient will improve TUG to </= 12 secs w/ LRAD to indicate reduced fall risk    Baseline 23.56 secs; 14.13 secs w/ RW    Time 6    Period Weeks    Status Revised      PT LONG TERM GOAL #5   Title Patient will be able to ambulate  x 300 ft outdoors on unlevel surfaces w/ LRAD and supervision for improved community mobility    Baseline TBA    Time 6    Period Weeks    Status Revised      PT LONG TERM GOAL #6   Title Patient will demo ability to ascend/descend 12 stairs with single rail, alternating pattern, and supervision for improved ability to ambulate to second floor of home    Baseline not currently  completing; continue to use bilat rails, alternating ascend, step to descend supervision    Time 6    Period Weeks    Status Revised      PT LONG TERM GOAL #7   Title Patient will improve FOTO to >/= 65%    Baseline 49%; continue with PT, unable to capture due to time constraints    Time 6    Period Weeks    Status On-going                 Plan - 10/29/20 1240     Clinical Impression Statement Today's skilled PT session focused on assesment of patient's progress toward LTGs. Patient able to meet LTG # 2-5, and partially meet LTG #6. Patient is demonstrating significant progress with PT services improving gait speed to 2.46 ft/sec with RW. Also demonstrating improved balance and reduced fall risk with Berg Balance 48/56, and TUG of 14.13 seconds. Patient will continue to benefit from skilled PT services to address impairments, move to LRAD, and further reduce fall risk. Will continue to progress toward all unmet goals.    Personal Factors and Comorbidities Comorbidity 3+    Comorbidities Migraines, HLD, DM, GERD, Lactose Intolerance, Kidney Stones, Diverticulitis, Anemia, Incontinence    Examination-Activity Limitations Bed Mobility;Reach Overhead;Sit;Locomotion Level;Stand;Transfers;Stairs    Examination-Participation Restrictions Community Activity;Driving    Stability/Clinical Decision Making Stable/Uncomplicated    Rehab Potential Good    PT Frequency 2x / week    PT Duration 6 weeks    PT Treatment/Interventions ADLs/Self Care Home Management;Electrical Stimulation;Moist Heat;Cryotherapy;DME Instruction;Gait training;Stair training;Functional mobility training;Therapeutic activities;Balance training;Therapeutic exercise;Neuromuscular re-education;Patient/family education;Orthotic Fit/Training;Passive range of motion;Vestibular    PT Next Visit Plan Continue use of Bioness with gait and NMR/strengthening. continue working with [facility] with quad tip. Continue high level balance, weight  shift activites, RLE strengthening. How was updated HEP?    Consulted and Agree with Plan of Care Patient;Family member/caregiver    Family Member Consulted Husband             Patient will benefit from skilled therapeutic intervention in order to improve the following deficits and impairments:  Abnormal gait, Decreased activity tolerance, Decreased endurance, Decreased knowledge of use of DME, Decreased range of motion, Decreased strength, Impaired UE functional use, Difficulty walking, Decreased balance, Decreased safety awareness, Decreased coordination  Visit Diagnosis: Other abnormalities of gait and mobility  Unsteadiness on feet  Muscle weakness (generalized)  Difficulty in walking, not elsewhere classified     Problem List Patient Active Problem List   Diagnosis Date Noted   Itching of both hands 10/20/2020   Sensation of cold in lower extremity 10/20/2020   Dysesthesia 09/21/2020   Hemiparesis due to recent stroke ([place]) 08/30/2020   Right sided weakness 08/29/2020   Pain in lateral portion of right ankle 07/20/2020   Plantar fasciitis of right foot 05/19/2020   Small vessel disease, cerebrovascular    Overactive bladder 10/24/2018   Amnesia memory loss 05/19/2017   Overweight (BMI 25.0-29.9) 05/09/2017   Lumbar herniated disc 02/01/2017  Medicare annual wellness visit, subsequent 05/03/2016   Lactose intolerance in adult    History of kidney stones    Cervical pain (neck) 12/04/2015   Health maintenance examination 04/29/2014   Paresthesia 04/29/2014   Advanced care planning/counseling discussion 04/29/2014   PFD (pelvic floor dysfunction) 04/29/2014   Controlled diabetes mellitus type 2 with complications ([place]) 25/49/8264   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   Hx of diverticulitis of colon 11/07/2012   Allergic rhinitis 11/07/2012   Hyperlipidemia associated with type 2 diabetes mellitus ([place]) 11/07/2012   Migraine 11/07/2012   Hereditary and idiopathic  peripheral neuropathy 11/07/2012   LGSIL Pap smear of vaginal cuff 11/07/2012   GERD (gastroesophageal reflux disease) 11/07/2012   Family history of premature coronary heart disease 11/07/2012    Jones Bales, PT, DPT 10/29/2020, 12:43 PM  [facility] [address] [place], [place], [address] Phone: [phone]   Fax:  [phone]  Name: BRIEONNA CRUTCHER MRN: [id number] Date of Birth: 06-Nov-1945

## 2020-11-02 ENCOUNTER — Ambulatory Visit: Payer: Medicare HMO | Admitting: Neurology

## 2020-11-02 ENCOUNTER — Telehealth: Payer: Self-pay | Admitting: Neurology

## 2020-11-02 MED ORDER — CLOPIDOGREL BISULFATE 75 MG PO TABS: 75.0000 mg | ORAL_TABLET | Freq: Every day | ORAL | 0 refills | Status: DC

## 2020-11-02 MED ORDER — CLOPIDOGREL BISULFATE 75 MG PO TABS: 75.0000 mg | ORAL_TABLET | Freq: Every day | ORAL | 3 refills | Status: DC

## 2020-11-02 NOTE — Telephone Encounter (Signed)
Pt is needing a refill on herclopidogrel (PLAVIX) 75 MG tablet sent [facility] to [facility] [place] Pt states she has no more left and is needing the refill today. Please advise.

## 2020-11-02 NOTE — Telephone Encounter (Signed)
Spoke to pt.  She was not aware that prescription was sent to [facility], I relayed that it actually did not go to (the way it was done).  Can do for 90 day supply to [facility] and then a local 30 day supply to [facility] [place] as she is out now.  She appreciated call back.

## 2020-11-06 ENCOUNTER — Ambulatory Visit: Payer: Medicare HMO | Attending: Family Medicine

## 2020-11-06 DIAGNOSIS — R262 Difficulty in walking, not elsewhere classified: Secondary | ICD-10-CM | POA: Insufficient documentation

## 2020-11-06 DIAGNOSIS — R278 Other lack of coordination: Secondary | ICD-10-CM | POA: Insufficient documentation

## 2020-11-06 DIAGNOSIS — M6281 Muscle weakness (generalized): Secondary | ICD-10-CM | POA: Insufficient documentation

## 2020-11-06 DIAGNOSIS — I69851 Hemiplegia and hemiparesis following other cerebrovascular disease affecting right dominant side: Secondary | ICD-10-CM | POA: Insufficient documentation

## 2020-11-06 DIAGNOSIS — R2689 Other abnormalities of gait and mobility: Secondary | ICD-10-CM | POA: Insufficient documentation

## 2020-11-06 DIAGNOSIS — R2681 Unsteadiness on feet: Secondary | ICD-10-CM | POA: Insufficient documentation

## 2020-11-11 ENCOUNTER — Ambulatory Visit: Payer: Medicare HMO | Admitting: Occupational Therapy

## 2020-11-11 ENCOUNTER — Other Ambulatory Visit: Payer: Self-pay

## 2020-11-11 ENCOUNTER — Encounter: Payer: Self-pay | Admitting: Occupational Therapy

## 2020-11-11 ENCOUNTER — Telehealth: Payer: Self-pay | Admitting: *Deleted

## 2020-11-11 DIAGNOSIS — R2681 Unsteadiness on feet: Secondary | ICD-10-CM | POA: Diagnosis not present

## 2020-11-11 DIAGNOSIS — M6281 Muscle weakness (generalized): Secondary | ICD-10-CM

## 2020-11-11 DIAGNOSIS — R2689 Other abnormalities of gait and mobility: Secondary | ICD-10-CM

## 2020-11-11 DIAGNOSIS — R278 Other lack of coordination: Secondary | ICD-10-CM | POA: Diagnosis not present

## 2020-11-11 DIAGNOSIS — R262 Difficulty in walking, not elsewhere classified: Secondary | ICD-10-CM | POA: Diagnosis not present

## 2020-11-11 DIAGNOSIS — I69851 Hemiplegia and hemiparesis following other cerebrovascular disease affecting right dominant side: Secondary | ICD-10-CM

## 2020-11-11 NOTE — Chronic Care Management (AMB) (Signed)
  Chronic Care Management   Note  11/11/2020 Name: Dana [name] MRN: [id number] DOB: Mar 19, 1946  Dana [name] is a 75 y.o. year old female who is a primary care patient of [name], MD. I reached out to [facility] by phone today in response to a referral sent by Dana [name]'s PCP [name], MD     Dana [name] was given information about Chronic Care Management services today including:  CCM service includes personalized support from designated clinical staff supervised by her physician, including individualized plan of care and coordination with other care providers 24/7 contact phone numbers for assistance for urgent and routine care needs. Service will only be billed when office clinical staff spend 20 minutes or more in a month to coordinate care. Only one practitioner may furnish and bill the service in a calendar month. The patient may stop CCM services at any time (effective at the end of the month) by phone call to the office staff. The patient will be responsible for cost sharing (co-pay) of up to 20% of the service fee (after annual deductible is met).  Patient agreed to services and verbal consent obtained.   Follow up plan: Telephone appointment with care management team member scheduled for:11/26/2020  Julian Hy, [place], [place] Management  Direct Dial: [phone]

## 2020-11-11 NOTE — Therapy (Signed)
[facility] [address] [place], [place], [address] Phone: [phone]   Fax:  [phone]  Occupational Therapy Treatment  Patient Details  Name: Dana [name] MRN: [id number] Date of Birth: Aug 27, 1945 Referring Provider (OT): [name] MD - follows up with Butler Denmark   Encounter Date: 11/11/2020   OT End of Session - 11/11/20 1534     Visit Number 7    Number of Visits 13    Date for OT Re-Evaluation 12/01/20    Authorization Type [employer]    Authorization Time Period VL:MN $20 copay per day    Authorization - Visit Number 7   corrected count   Authorization - Number of Visits 10    OT Start Time 6440    OT Stop Time 1611    OT Time Calculation (min) 39 min    Activity Tolerance Patient tolerated treatment well    Behavior During Therapy [facility] for tasks assessed/performed             Past Medical History:  Diagnosis Date   Allergy    Anemia    Arthritis    Cataract    Controlled type 2 diabetes mellitus without complication, without long-term current use of insulin ([place])    Diverticulitis    History of chicken pox    Hyperlipidemia    Kidney stone    Lactose intolerance in adult    Migraine    Urge incontinence 04/29/2014    Past Surgical History:  Procedure Laterality Date   ABDOMINAL HYSTERECTOMY  2006   cervix remains, menorrhagia   [place]   right milk duct removed   CARPAL TUNNEL RELEASE Right 02/2017   CHOLECYSTECTOMY  1971   COLONOSCOPY  11/2005   diverticulosis o/w WNL Mikel Cella)   DEXA  2015   spine -0.4, hip -1.0 WNL   LITHOTRIPSY     LUMBAR EPIDURAL INJECTION Left 10/2017   transforaminal L L3/4 (Dr Maryjean Ka)   TUBAL LIGATION      There were no vitals filed for this visit.   Subjective Assessment - 11/11/20 1534     Subjective  Pt denies any pain.    Pertinent History Migraines, HLD, DM,  GERD, Lactose Intolerance, Kidney Stones, Diverticulitis, Anemia, Incontinence    Limitations no driving. fall risk.    Patient Stated Goals "a lot of trouble with fine motor skills" "writing"              Small Pegs  with RUE with holding marble in RUE for encouraging more pincer grasp. Pt removed pegs with in hand manipulation.  Resistance Clothespins 1-8# with RUE for sustained grasp  Hand Gripper: with RUE on level 2 with black spring. Pt picked up 1 inch blocks with gripper with min drops and min difficulty.                     OT Short Term Goals - 10/27/20 1537       OT SHORT TERM GOAL #1   Title Pt will be independent with HEP or coordination and grip strength in RUE    Time 3    Period Weeks    Status Achieved   issued coordination HEP 10/08/20   Target Date 10/27/20      OT SHORT TERM GOAL #2   Title Pt will write a sentence with 90% legibility with  use of adapted strategies and/or equipment PRN.    Baseline 50% of prior level of handwriting    Time 3    Period Weeks    Status Achieved   100%     OT SHORT TERM GOAL #3   Title Pt will increased fine motor coordination in RUE by completing 9 hole peg test in 27 seconds or less.    Baseline RUE 30, LUE 20.1    Time 3    Period Weeks    Status Achieved   10/19/20 - 24 sec w/ RUE     OT SHORT TERM GOAL #4   Title Pt will safely navigate in kitchen with mod I with no LOB and with good walker management.    Time 3    Period Weeks    Status Achieved               OT Long Term Goals - 11/11/20 1536       OT LONG TERM GOAL #1   Title Pt will complete FOTO at discharge and score at least 70%    Baseline 57%    Time 8    Period Weeks    Status Revised      OT LONG TERM GOAL #2   Title Pt will be independent with any updated HEPs    Time 8    Period Weeks    Status On-going      OT LONG TERM GOAL #3   Title Pt will improve grip strength in RUE by 5 lbs or more in order to increase  strength in dominant hand.    Baseline R 28.6, L 35.7    Time 8    Period Weeks    Status Achieved   40.3 lbs with RUE 10/29/20     OT LONG TERM GOAL #4   Title Pt will improve fine motor coordination in RUE by 5 seconds on 9 hole peg test    Baseline R 30s L 20.1    Time 8    Period Weeks    Status Achieved   10/19/20 - 24 sec RUE     OT LONG TERM GOAL #5   Title Pt will plan and execute a simple meal for she and her spouse with min assistance from spouse.    Time 8    Period Weeks    Status Achieved   has been cooking for spouse 11/11/20                  Plan - 11/11/20 1551     Clinical Impression Statement Pt continues to progress towards unmet goals. Pt is on track to discharge at end of next week and as verbalize understanding.    OT Occupational Profile and History Problem Focused Assessment - Including review of records relating to presenting problem    Occupational performance deficits (Please refer to evaluation for details): IADL's;ADL's;Leisure    Body Structure / Function / Physical Skills ADL;FMC;Coordination;Flexibility;IADL;ROM;UE functional use;Pain;Dexterity;Strength;Decreased knowledge of use of DME    Cognitive Skills Problem Solve    Rehab Potential Good    Clinical Decision Making Limited treatment options, no task modification necessary    Comorbidities Affecting Occupational Performance: None    Modification or Assistance to Complete Evaluation  No modification of tasks or assist necessary to complete eval    OT Frequency 2x / week    OT Duration 8 weeks   12 visits over 8 weeks to account for any missed  visits or scheduling difficulties.   OT Treatment/Interventions Self-care/ADL training;Functional Furniture conservator/restorer;Therapeutic exercise;Cognitive remediation/compensation;Patient/family education;Energy conservation;Neuromuscular education;DME and/or AE instruction;Therapeutic activities;Moist Heat    Plan coordination, proximal strengthening, arm  bike, complete FOTO, on target to discharge at end of scheduled visits    Consulted and Agree with Plan of Care Patient             Patient will benefit from skilled therapeutic intervention in order to improve the following deficits and impairments:   Body Structure / Function / Physical Skills: ADL, FMC, Coordination, Flexibility, IADL, ROM, UE functional use, Pain, Dexterity, Strength, Decreased knowledge of use of DME Cognitive Skills: Problem Solve     Visit Diagnosis: Other abnormalities of gait and mobility  Muscle weakness (generalized)  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)  Unsteadiness on feet  Other lack of coordination    Problem List Patient Active Problem List   Diagnosis Date Noted   Itching of both hands 10/20/2020   Sensation of cold in lower extremity 10/20/2020   Dysesthesia 09/21/2020   Hemiparesis due to recent stroke ([place]) 08/30/2020   Right sided weakness 08/29/2020   Pain in lateral portion of right ankle 07/20/2020   Plantar fasciitis of right foot 05/19/2020   Small vessel disease, cerebrovascular    Overactive bladder 10/24/2018   Amnesia memory loss 05/19/2017   Overweight (BMI 25.0-29.9) 05/09/2017   Lumbar herniated disc 02/01/2017   Medicare annual wellness visit, subsequent 05/03/2016   Lactose intolerance in adult    History of kidney stones    Cervical pain (neck) 12/04/2015   Health maintenance examination 04/29/2014   Paresthesia 04/29/2014   Advanced care planning/counseling discussion 04/29/2014   PFD (pelvic floor dysfunction) 04/29/2014   Controlled diabetes mellitus type 2 with complications ([place]) 67/89/3810   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   Hx of diverticulitis of colon 11/07/2012   Allergic rhinitis 11/07/2012   Hyperlipidemia associated with type 2 diabetes mellitus ([place]) 11/07/2012   Migraine 11/07/2012   Hereditary and idiopathic peripheral  neuropathy 11/07/2012   LGSIL Pap smear of vaginal cuff 11/07/2012   GERD (gastroesophageal reflux disease) 11/07/2012   Family history of premature coronary heart disease 11/07/2012    Zachery Conch MOT, OTR/L  11/11/2020, 4:11 PM  [facility] [address] [place], [place], [address] Phone: [phone]   Fax:  [phone]  Name: Dana [name] MRN: [id number] Date of Birth: 06-16-1945

## 2020-11-13 ENCOUNTER — Ambulatory Visit: Payer: Medicare HMO | Admitting: Occupational Therapy

## 2020-11-13 ENCOUNTER — Encounter: Payer: Self-pay | Admitting: Occupational Therapy

## 2020-11-13 ENCOUNTER — Other Ambulatory Visit: Payer: Self-pay

## 2020-11-13 DIAGNOSIS — R278 Other lack of coordination: Secondary | ICD-10-CM

## 2020-11-13 DIAGNOSIS — M6281 Muscle weakness (generalized): Secondary | ICD-10-CM | POA: Diagnosis not present

## 2020-11-13 DIAGNOSIS — I69851 Hemiplegia and hemiparesis following other cerebrovascular disease affecting right dominant side: Secondary | ICD-10-CM | POA: Diagnosis not present

## 2020-11-13 DIAGNOSIS — R262 Difficulty in walking, not elsewhere classified: Secondary | ICD-10-CM | POA: Diagnosis not present

## 2020-11-13 DIAGNOSIS — R2689 Other abnormalities of gait and mobility: Secondary | ICD-10-CM | POA: Diagnosis not present

## 2020-11-13 DIAGNOSIS — R2681 Unsteadiness on feet: Secondary | ICD-10-CM

## 2020-11-13 NOTE — Therapy (Signed)
[facility] [address] [place], [place], [address] Phone: [phone]   Fax:  [phone]  Occupational Therapy Treatment  Patient Details  Name: Dana [name] MRN: [id number] Date of Birth: Nov 26, 1945 Referring Provider (OT): [name] MD - follows up with Butler Denmark   Encounter Date: 11/13/2020   OT End of Session - 11/13/20 1105     Visit Number 8    Number of Visits 13    Date for OT Re-Evaluation 12/01/20    Authorization Type [employer]    Authorization Time Period VL:MN $20 copay per day    Authorization - Visit Number 7   corrected count   Authorization - Number of Visits 10    OT Start Time 1102    OT Stop Time 1145    OT Time Calculation (min) 43 min    Activity Tolerance Patient tolerated treatment well    Behavior During Therapy [facility] for tasks assessed/performed             Past Medical History:  Diagnosis Date   Allergy    Anemia    Arthritis    Cataract    Controlled type 2 diabetes mellitus without complication, without long-term current use of insulin ([place])    Diverticulitis    History of chicken pox    Hyperlipidemia    Kidney stone    Lactose intolerance in adult    Migraine    Urge incontinence 04/29/2014    Past Surgical History:  Procedure Laterality Date   ABDOMINAL HYSTERECTOMY  2006   cervix remains, menorrhagia   [place]   right milk duct removed   CARPAL TUNNEL RELEASE Right 02/2017   CHOLECYSTECTOMY  1971   COLONOSCOPY  11/2005   diverticulosis o/w WNL Mikel Cella)   DEXA  2015   spine -0.4, hip -1.0 WNL   LITHOTRIPSY     LUMBAR EPIDURAL INJECTION Left 10/2017   transforaminal L L3/4 (Dr Maryjean Ka)   TUBAL LIGATION      There were no vitals filed for this visit.   Subjective Assessment - 11/13/20 1104     Subjective  Pt reports things are going "good" at home. Pt reports  everything is "about the same"    Pertinent History Migraines, HLD, DM, GERD, Lactose Intolerance, Kidney Stones, Diverticulitis, Anemia, Incontinence    Limitations no driving. fall risk.    Patient Stated Goals "a lot of trouble with fine motor skills" "writing"    Currently in Pain? No/denies             Pt received from lobby.  Pt reports things are getting better and is agreeable to discharge at end of scheduled visits.   Grooved Pegs with RUE with tweezers for increase in coordination and sustained grasp of items in hand. No drops.  Small Pegs on a vertical surface for shoulder stability with RUE and coordination in RUE. Pt copied pattern with no difficulty.   Pt ambulated to check out with mod I at conclusion of therapy session.                 OT Short Term Goals - 10/27/20 1537       OT SHORT TERM GOAL #1   Title Pt will be independent with HEP or coordination and grip strength in RUE    Time 3    Period Weeks  Status Achieved   issued coordination HEP 10/08/20   Target Date 10/27/20      OT SHORT TERM GOAL #2   Title Pt will write a sentence with 90% legibility with use of adapted strategies and/or equipment PRN.    Baseline 50% of prior level of handwriting    Time 3    Period Weeks    Status Achieved   100%     OT SHORT TERM GOAL #3   Title Pt will increased fine motor coordination in RUE by completing 9 hole peg test in 27 seconds or less.    Baseline RUE 30, LUE 20.1    Time 3    Period Weeks    Status Achieved   10/19/20 - 24 sec w/ RUE     OT SHORT TERM GOAL #4   Title Pt will safely navigate in kitchen with mod I with no LOB and with good walker management.    Time 3    Period Weeks    Status Achieved               OT Long Term Goals - 11/11/20 1536       OT LONG TERM GOAL #1   Title Pt will complete FOTO at discharge and score at least 70%    Baseline 57%    Time 8    Period Weeks    Status Revised      OT LONG TERM  GOAL #2   Title Pt will be independent with any updated HEPs    Time 8    Period Weeks    Status On-going      OT LONG TERM GOAL #3   Title Pt will improve grip strength in RUE by 5 lbs or more in order to increase strength in dominant hand.    Baseline R 28.6, L 35.7    Time 8    Period Weeks    Status Achieved   40.3 lbs with RUE 10/29/20     OT LONG TERM GOAL #4   Title Pt will improve fine motor coordination in RUE by 5 seconds on 9 hole peg test    Baseline R 30s L 20.1    Time 8    Period Weeks    Status Achieved   10/19/20 - 24 sec RUE     OT LONG TERM GOAL #5   Title Pt will plan and execute a simple meal for she and her spouse with min assistance from spouse.    Time 8    Period Weeks    Status Achieved   has been cooking for spouse 11/11/20                  Plan - 11/13/20 1139     Clinical Impression Statement Pt is progressing well. Discharge on track for next visit.    OT Occupational Profile and History Problem Focused Assessment - Including review of records relating to presenting problem    Occupational performance deficits (Please refer to evaluation for details): IADL's;ADL's;Leisure    Body Structure / Function / Physical Skills ADL;FMC;Coordination;Flexibility;IADL;ROM;UE functional use;Pain;Dexterity;Strength;Decreased knowledge of use of DME    Cognitive Skills Problem Solve    Rehab Potential Good    Clinical Decision Making Limited treatment options, no task modification necessary    Comorbidities Affecting Occupational Performance: None    Modification or Assistance to Complete Evaluation  No modification of tasks or assist necessary to complete eval    OT  Frequency 2x / week    OT Duration 8 weeks   12 visits over 8 weeks to account for any missed visits or scheduling difficulties.   OT Treatment/Interventions Self-care/ADL training;Functional Furniture conservator/restorer;Therapeutic exercise;Cognitive remediation/compensation;Patient/family  education;Energy conservation;Neuromuscular education;DME and/or AE instruction;Therapeutic activities;Moist Heat    Plan coordination, proximal strengthening, arm bike, complete FOTO, on target to discharge    Consulted and Agree with Plan of Care Patient             Patient will benefit from skilled therapeutic intervention in order to improve the following deficits and impairments:   Body Structure / Function / Physical Skills: ADL, FMC, Coordination, Flexibility, IADL, ROM, UE functional use, Pain, Dexterity, Strength, Decreased knowledge of use of DME Cognitive Skills: Problem Solve     Visit Diagnosis: Other abnormalities of gait and mobility  Muscle weakness (generalized)  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)  Unsteadiness on feet  Other lack of coordination    Problem List Patient Active Problem List   Diagnosis Date Noted   Itching of both hands 10/20/2020   Sensation of cold in lower extremity 10/20/2020   Dysesthesia 09/21/2020   Hemiparesis due to recent stroke ([place]) 08/30/2020   Right sided weakness 08/29/2020   Pain in lateral portion of right ankle 07/20/2020   Plantar fasciitis of right foot 05/19/2020   Small vessel disease, cerebrovascular    Overactive bladder 10/24/2018   Amnesia memory loss 05/19/2017   Overweight (BMI 25.0-29.9) 05/09/2017   Lumbar herniated disc 02/01/2017   Medicare annual wellness visit, subsequent 05/03/2016   Lactose intolerance in adult    History of kidney stones    Cervical pain (neck) 12/04/2015   Health maintenance examination 04/29/2014   Paresthesia 04/29/2014   Advanced care planning/counseling discussion 04/29/2014   PFD (pelvic floor dysfunction) 04/29/2014   Controlled diabetes mellitus type 2 with complications ([place]) 56/43/3295   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   Hx of diverticulitis of colon 11/07/2012   Allergic rhinitis 11/07/2012    Hyperlipidemia associated with type 2 diabetes mellitus ([place]) 11/07/2012   Migraine 11/07/2012   Hereditary and idiopathic peripheral neuropathy 11/07/2012   LGSIL Pap smear of vaginal cuff 11/07/2012   GERD (gastroesophageal reflux disease) 11/07/2012   Family history of premature coronary heart disease 11/07/2012    Zachery Conch MOT, OTR/L  11/13/2020, 11:40 AM  [facility] [address] [place], [place], [address] Phone: [phone]   Fax:  [phone]  Name: Dana [name] MRN: [id number] Date of Birth: November 24, 1945

## 2020-11-16 ENCOUNTER — Encounter: Payer: Self-pay | Admitting: Family Medicine

## 2020-11-17 ENCOUNTER — Ambulatory Visit: Payer: Medicare HMO | Admitting: Occupational Therapy

## 2020-11-18 ENCOUNTER — Telehealth: Payer: Self-pay | Admitting: Neurology

## 2020-11-18 ENCOUNTER — Ambulatory Visit: Payer: Medicare HMO

## 2020-11-18 ENCOUNTER — Other Ambulatory Visit: Payer: Self-pay

## 2020-11-18 ENCOUNTER — Other Ambulatory Visit: Payer: Self-pay | Admitting: Family Medicine

## 2020-11-18 VITALS — BP 103/54 | HR 74

## 2020-11-18 DIAGNOSIS — R262 Difficulty in walking, not elsewhere classified: Secondary | ICD-10-CM | POA: Diagnosis not present

## 2020-11-18 DIAGNOSIS — R2681 Unsteadiness on feet: Secondary | ICD-10-CM | POA: Diagnosis not present

## 2020-11-18 DIAGNOSIS — R278 Other lack of coordination: Secondary | ICD-10-CM | POA: Diagnosis not present

## 2020-11-18 DIAGNOSIS — R2689 Other abnormalities of gait and mobility: Secondary | ICD-10-CM | POA: Diagnosis not present

## 2020-11-18 DIAGNOSIS — M6281 Muscle weakness (generalized): Secondary | ICD-10-CM | POA: Diagnosis not present

## 2020-11-18 DIAGNOSIS — I69851 Hemiplegia and hemiparesis following other cerebrovascular disease affecting right dominant side: Secondary | ICD-10-CM | POA: Diagnosis not present

## 2020-11-18 MED ORDER — EZETIMIBE 10 MG PO TABS: 10.0000 mg | ORAL_TABLET | Freq: Every day | ORAL | 3 refills | Status: DC

## 2020-11-18 NOTE — Telephone Encounter (Signed)
Pt would like a call back to discuss the side effects to her newest medications, please call.

## 2020-11-18 NOTE — Therapy (Signed)
[facility] [address] [place], [place], [address] Phone: [phone]   Fax:  [phone]  Physical Therapy Treatment  Patient Details  Name: Dana [name] MRN: [id number] Date of Birth: 05/28/1945 Referring Provider (PT): Referred by: Dana Austin, NP. PCP is Dana Bush, MD   Encounter Date: 11/18/2020   PT End of Session - 11/18/20 1104     Visit Number 10    Number of Visits 21    Date for PT Re-Evaluation 12/15/20    Authorization Type [employer] (10th Visit PN)    Authorization Time Period Auth: 09/16/2020 - 10/29/2020; Awaiting New Authorization    Progress Note Due on Visit 19   completed on 9th visit   PT Start Time 1101    PT Stop Time 1144    PT Time Calculation (min) 43 min    Equipment Utilized During Treatment Gait belt    Activity Tolerance Patient tolerated treatment well    Behavior During Therapy WFL for tasks assessed/performed             Past Medical History:  Diagnosis Date   Allergy    Anemia    Arthritis    Cataract    Controlled type 2 diabetes mellitus without complication, without long-term current use of insulin ([place])    Diverticulitis    History of chicken pox    Hyperlipidemia    Kidney stone    Lactose intolerance in adult    Migraine    Urge incontinence 04/29/2014    Past Surgical History:  Procedure Laterality Date   ABDOMINAL HYSTERECTOMY  2006   cervix remains, menorrhagia   [place]   right milk duct removed   CARPAL TUNNEL RELEASE Right 02/2017   CHOLECYSTECTOMY  1971   COLONOSCOPY  11/2005   diverticulosis o/w WNL [facility])   DEXA  2015   spine -0.4, hip -1.0 WNL   LITHOTRIPSY     LUMBAR EPIDURAL INJECTION Left 10/2017   transforaminal L L3/4 (Dr Maryjean Ka)   TUBAL LIGATION      Vitals:   11/18/20 1105  BP: (!) 103/54  Pulse: 74     Subjective Assessment -  11/18/20 1103     Subjective Patient reports that she has been having some stomach issues, reports feels at is due to her medications. Patient reports some intermittent lightheadedness.    Patient is accompained by: Family member   [name])   Pertinent History Migraines, HLD, DM, GERD, Lactose Intolerance, Kidney Stones, Diverticulitis, Anemia, Incontinence    Limitations Sitting;Standing;Walking    How long can you walk comfortably? 10 minutes    Patient Stated Goals Walk without Assistance;    Currently in Pain? No/denies                  [facility] Adult PT Treatment/Exercise - 11/18/20 0001       Transfers   Transfers Sit to Stand;Stand to Sit    Sit to Stand 6: Modified independent (Device/Increase time)    Stand to Sit 6: Modified independent (Device/Increase time)      Ambulation/Gait   Ambulation/Gait Yes    Ambulation/Gait Assistance 5: Supervision;4: Min guard    Ambulation/Gait Assistance Details completed ambulation into therapy session with RW, throughout session used SPC with quad tip. Patient demo appropriate sequencing with SPC, just require verbal cues for step length to promote improved weigth  shift onto RLE.    Ambulation Distance (Feet) 230 Feet    Assistive device Rolling walker;Straight cane    Gait Pattern Step-through pattern;Decreased arm swing - right;Decreased arm swing - left;Decreased step length - right;Decreased step length - left;Decreased stance time - right;Decreased hip/knee flexion - right;Decreased dorsiflexion - right;Decreased weight shift to right;Poor foot clearance - right    Ambulation Surface Level;Indoor      Neuro Re-ed    Neuro Re-ed Details  standing with UE support, completed altenrating steps fwd/back over black balance beam x 10 reps bilat. PT providing cues for improved lateral weight shift prior to completing the step over. CGA required. Then with rockerboard at countertop positioned A/P: completd static standing maintaining  board steady 2 x 30 seocnds, then progressed to A/P weight shift x 15 reps with light UE support. Then progressed to board positioned laterally: and completed static standing 2 x 30 seconds, and then lateral weight shift R/L x 15 reps. Increased challenge with board positioned lateral > A/P. BP after completion: 110/67, HR: 73      Exercises   Exercises Knee/Hip      Knee/Hip Exercises: Aerobic   Other Aerobic Completed SciFit on Level 2.5 with BUE/BLE x 6 minutes with keeping steps per minute >/= 75 for improved endurance training/strengthening. PT monitoring BP after activity: 108/63, HR: 77                    PT Education - 11/18/20 1109     Education Details Water Intake; Monitor BP at home with episodes of lightheadedness    Person(s) Educated Patient    Methods Explanation    Comprehension Verbalized understanding              PT Short Term Goals - 10/29/20 1244       PT SHORT TERM GOAL #1   Title Patient will be able to ambulate x 250 ft with SPC vs No AD with superivison for improved household/community mobility    Baseline continue use of RW    Time 3    Period Weeks    Status New    Target Date 11/19/20               PT Long Term Goals - 10/29/20 1246       PT LONG TERM GOAL #1   Title Patient will be independent with final HEP for balance/strength and walking program (All LTGs Due: 12/15/20)    Baseline no HEP established; reports independence, continue to progress HEP at this time, not completing daily    Time 6    Period Weeks    Status On-going    Target Date 12/15/20      PT LONG TERM GOAL #2   Title Patient will improve gait speed to >/= 2.5 ft/sec with LRAD    Baseline 1.19 ft/sec, 2.42 ft/sec with RW    Time 6    Period Weeks    Status Revised      PT LONG TERM GOAL #3   Title Patient will improve Berg Balance to >/= 53/56 to indicate improved balance and reduced fall risk    Baseline 34/56, 48/56    Time 6    Period Weeks     Status Revised      PT LONG TERM GOAL #4   Title Patient will improve TUG to </= 12 secs w/ LRAD to indicate reduced fall risk    Baseline 23.56 secs; 14.13 secs w/  RW    Time 6    Period Weeks    Status Revised      PT LONG TERM GOAL #5   Title Patient will be able to ambulate x 300 ft outdoors on unlevel surfaces w/ LRAD and supervision for improved community mobility    Baseline TBA    Time 6    Period Weeks    Status Revised      PT LONG TERM GOAL #6   Title Patient will demo ability to ascend/descend 12 stairs with single rail, alternating pattern, and supervision for improved ability to ambulate to second floor of home    Baseline not currently completing; continue to use bilat rails, alternating ascend, step to descend supervision    Time 6    Period Weeks    Status Revised      PT LONG TERM GOAL #7   Title Patient will improve FOTO to >/= 65%    Baseline 49%; continue with PT, unable to capture due to time constraints    Time 6    Period Weeks    Status On-going                   Plan - 11/18/20 1153     Clinical Impression Statement Continued gait training today with SPC w/ quad tip, patient continue to demo appropriate sequencing and improved tolerance for use of AD. Rest of session focused on continud NMR working on improved stance and weight shift onto RLE. No reports of lightheadedness throughout session, PT educating patient to monitor symptoms and BP due to lower readings today. Will continue to progress toward all LTGs.    Personal Factors and Comorbidities Comorbidity 3+    Comorbidities Migraines, HLD, DM, GERD, Lactose Intolerance, Kidney Stones, Diverticulitis, Anemia, Incontinence    Examination-Activity Limitations Bed Mobility;Reach Overhead;Sit;Locomotion Level;Stand;Transfers;Stairs    Examination-Participation Restrictions Community Activity;Driving    Stability/Clinical Decision Making Stable/Uncomplicated    Rehab Potential Good    PT  Frequency 2x / week    PT Duration 6 weeks    PT Treatment/Interventions ADLs/Self Care Home Management;Electrical Stimulation;Moist Heat;Cryotherapy;DME Instruction;Gait training;Stair training;Functional mobility training;Therapeutic activities;Balance training;Therapeutic exercise;Neuromuscular re-education;Patient/family education;Orthotic Fit/Training;Passive range of motion;Vestibular    PT Next Visit Plan Continue use of Bioness with gait and SPC. NMR/strengthening. continue working with [facility] with quad tip. Continue high level balance, weight shift activites, RLE strengthening.    Consulted and Agree with Plan of Care Patient;Family member/caregiver    Family Member Consulted Husband             Patient will benefit from skilled therapeutic intervention in order to improve the following deficits and impairments:  Abnormal gait, Decreased activity tolerance, Decreased endurance, Decreased knowledge of use of DME, Decreased range of motion, Decreased strength, Impaired UE functional use, Difficulty walking, Decreased balance, Decreased safety awareness, Decreased coordination  Visit Diagnosis: Other abnormalities of gait and mobility  Muscle weakness (generalized)  Unsteadiness on feet  Difficulty in walking, not elsewhere classified     Problem List Patient Active Problem List   Diagnosis Date Noted   Itching of both hands 10/20/2020   Sensation of cold in lower extremity 10/20/2020   Dysesthesia 09/21/2020   Hemiparesis due to recent stroke ([place]) 08/30/2020   Right sided weakness 08/29/2020   Pain in lateral portion of right ankle 07/20/2020   Plantar fasciitis of right foot 05/19/2020   Small vessel disease, cerebrovascular    Overactive bladder 10/24/2018   Amnesia memory loss 05/19/2017  Overweight (BMI 25.0-29.9) 05/09/2017   Lumbar herniated disc 02/01/2017   Medicare annual wellness visit, subsequent 05/03/2016   Lactose intolerance in adult    History of  kidney stones    Cervical pain (neck) 12/04/2015   Health maintenance examination 04/29/2014   Paresthesia 04/29/2014   Advanced care planning/counseling discussion 04/29/2014   PFD (pelvic floor dysfunction) 04/29/2014   Controlled diabetes mellitus type 2 with complications ([place]) 93/73/4287   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   Hx of diverticulitis of colon 11/07/2012   Allergic rhinitis 11/07/2012   Hyperlipidemia associated with type 2 diabetes mellitus ([place]) 11/07/2012   Migraine 11/07/2012   Hereditary and idiopathic peripheral neuropathy 11/07/2012   LGSIL Pap smear of vaginal cuff 11/07/2012   GERD (gastroesophageal reflux disease) 11/07/2012   Family history of premature coronary heart disease 11/07/2012    Jones Bales, PT, DPT 11/18/2020, 12:00 PM  [facility] [address] [place], [place], [address] Phone: [phone]   Fax:  [phone]  Name: Dana [name] MRN: [id number] Date of Birth: [birth year]

## 2020-11-18 NOTE — Telephone Encounter (Signed)
She has noticed new onset of constipation, fatigue and lightheadedness. She has recently started on Zetia and feels even worse. States her PCP instructed her to withhold the medication for two weeks and monitor symptoms. Inquired about her water intake. Says some days she does not drink very much. Discussed the importance of staying well hydrated. Also, making sure she is getting fiber in her diet. She is agreeable to this plan. No signs or symptoms of active infection reported. She is going to continue close follow up w/ her PCP.

## 2020-11-20 ENCOUNTER — Ambulatory Visit: Payer: Medicare HMO

## 2020-11-20 ENCOUNTER — Ambulatory Visit: Payer: Medicare HMO | Admitting: Occupational Therapy

## 2020-11-20 ENCOUNTER — Other Ambulatory Visit: Payer: Self-pay

## 2020-11-20 VITALS — BP 100/68 | HR 74

## 2020-11-20 DIAGNOSIS — M6281 Muscle weakness (generalized): Secondary | ICD-10-CM | POA: Diagnosis not present

## 2020-11-20 DIAGNOSIS — R2681 Unsteadiness on feet: Secondary | ICD-10-CM | POA: Diagnosis not present

## 2020-11-20 DIAGNOSIS — R2689 Other abnormalities of gait and mobility: Secondary | ICD-10-CM | POA: Diagnosis not present

## 2020-11-20 DIAGNOSIS — R262 Difficulty in walking, not elsewhere classified: Secondary | ICD-10-CM

## 2020-11-20 DIAGNOSIS — R278 Other lack of coordination: Secondary | ICD-10-CM | POA: Diagnosis not present

## 2020-11-20 DIAGNOSIS — I69851 Hemiplegia and hemiparesis following other cerebrovascular disease affecting right dominant side: Secondary | ICD-10-CM

## 2020-11-20 NOTE — Therapy (Signed)
[facility] [address] [place], [place], [address] Phone: [phone]   Fax:  [phone]  Physical Therapy Treatment  Patient Details  Name: Dana [name] MRN: [id number] Date of Birth: 1946/01/15 Referring Provider (PT): Referred by: [name], NP. PCP is [name], MD   Encounter Date: 11/20/2020   PT End of Session - 11/20/20 1018     Visit Number 11    Number of Visits 21    Date for PT Re-Evaluation 12/15/20    Authorization Type [employer] (10th Visit PN)    Authorization Time Period Auth: 09/16/2020 - 10/29/2020; Awaiting New Authorization    Progress Note Due on Visit 19   completed on 9th visit   PT Start Time 1017    PT Stop Time 1059    PT Time Calculation (min) 42 min    Equipment Utilized During Treatment Gait belt    Activity Tolerance Patient tolerated treatment well    Behavior During Therapy WFL for tasks assessed/performed             Past Medical History:  Diagnosis Date   Allergy    Anemia    Arthritis    Cataract    Controlled type 2 diabetes mellitus without complication, without long-term current use of insulin ([place])    Diverticulitis    History of chicken pox    Hyperlipidemia    Kidney stone    Lactose intolerance in adult    Migraine    Urge incontinence 04/29/2014    Past Surgical History:  Procedure Laterality Date   ABDOMINAL HYSTERECTOMY  2006   cervix remains, menorrhagia   [place]   right milk duct removed   CARPAL TUNNEL RELEASE Right 02/2017   CHOLECYSTECTOMY  1971   COLONOSCOPY  11/2005   diverticulosis o/w WNL [facility])   DEXA  2015   spine -0.4, hip -1.0 WNL   LITHOTRIPSY     LUMBAR EPIDURAL INJECTION Left 10/2017   transforaminal L L3/4 (Dr Maryjean Ka)   TUBAL LIGATION      Vitals:   11/20/20 1024  BP: 100/68  Pulse: 74     Subjective Assessment - 11/20/20  1021     Subjective Patient reports that BP was good yesterday. No other new changes/complaints.    Patient is accompained by: Family member   Husband [name])   Pertinent History Migraines, HLD, DM, GERD, Lactose Intolerance, Kidney Stones, Diverticulitis, Anemia, Incontinence    Limitations Sitting;Standing;Walking    How long can you walk comfortably? 10 minutes    Patient Stated Goals Walk without Assistance;    Currently in Pain? No/denies               [facility] Adult PT Treatment/Exercise - 11/20/20 0001       Transfers   Transfers Sit to Stand;Stand to Sit    Sit to Stand 6: Modified independent (Device/Increase time)    Stand to Sit 6: Modified independent (Device/Increase time)    Comments completed sit <> stands with LLE placed on 2' block to promote weight shift to RLE, completed 2 x 10 reps. Cues for soft knee and to avoid recurvatum with completion.      Ambulation/Gait   Ambulation/Gait Yes    Ambulation/Gait Assistance 5: Supervision;4: Min guard    Ambulation/Gait Assistance Details completed ambulaiton/gait training concurrent with use of Bioness on R Ant Tib  x 115 ft, patient demo mild eversion. PT paused gait training with readjustment to bioness adn then contineud gait training x 250 ft with patient demo proper sequencing of SPC. PT providing cues for step length on LLE to promote weight shift/stance on RLE. Then completed ambulation x 200 ft with improved toe clearance and knee stability noted. Intermittent rest breaks required.    Ambulation Distance (Feet) 115 Feet   x1, 250 x 1   Assistive device Straight cane   w/ quad tip   Gait Pattern Step-through pattern;Decreased arm swing - right;Decreased arm swing - left;Decreased step length - right;Decreased step length - left;Decreased stance time - right;Decreased hip/knee flexion - right;Decreased dorsiflexion - right;Decreased weight shift to right;Poor foot clearance - right    Ambulation Surface Level;Indoor       Teacher, music neuro-re Merchandiser, retail Parameters see tablet 1; quick fit electrodes    Electrical Stimulation Goals Strength;Neuromuscular facilitation               PT Short Term Goals - 10/29/20 1244       PT SHORT TERM GOAL #1   Title Patient will be able to ambulate x 250 ft with SPC vs No AD with superivison for improved household/community mobility    Baseline continue use of RW    Time 3    Period Weeks    Status New    Target Date 11/19/20               PT Long Term Goals - 10/29/20 1246       PT LONG TERM GOAL #1   Title Patient will be independent with final HEP for balance/strength and walking program (All LTGs Due: 12/15/20)    Baseline no HEP established; reports independence, continue to progress HEP at this time, not completing daily    Time 6    Period Weeks    Status On-going    Target Date 12/15/20      PT LONG TERM GOAL #2   Title Patient will improve gait speed to >/= 2.5 ft/sec with LRAD    Baseline 1.19 ft/sec, 2.42 ft/sec with RW    Time 6    Period Weeks    Status Revised      PT LONG TERM GOAL #3   Title Patient will improve Berg Balance to >/= 53/56 to indicate improved balance and reduced fall risk    Baseline 34/56, 48/56    Time 6    Period Weeks    Status Revised      PT LONG TERM GOAL #4   Title Patient will improve TUG to </= 12 secs w/ LRAD to indicate reduced fall risk    Baseline 23.56 secs; 14.13 secs w/ RW    Time 6    Period Weeks    Status Revised      PT LONG TERM GOAL #5   Title Patient will be able to ambulate x 300 ft outdoors on unlevel surfaces w/ LRAD and supervision for improved community mobility    Baseline TBA    Time 6    Period Weeks    Status Revised      PT LONG TERM GOAL #6   Title Patient will demo ability to ascend/descend 12 stairs with single rail, alternating pattern, and  supervision for improved ability to ambulate to second floor of home  Baseline not currently completing; continue to use bilat rails, alternating ascend, step to descend supervision    Time 6    Period Weeks    Status Revised      PT LONG TERM GOAL #7   Title Patient will improve FOTO to >/= 65%    Baseline 49%; continue with PT, unable to capture due to time constraints    Time 6    Period Weeks    Status On-going                   Plan - 11/20/20 1111     Clinical Impression Statement Completed gait training with [facility] w/ quad tip with concurrent Bioness at start of session with patient demonstrating no need for stimulation at R quadriceps due to improved knee control. Therefore bioness on stimulating R Ant Tib. Patient demo proper sequencing of SPC with potential for use at home next week after more practice. Continued NMR to promote imrpoved strengthening of quadriceps on RLE. Will continue per POC.    Personal Factors and Comorbidities Comorbidity 3+    Comorbidities Migraines, HLD, DM, GERD, Lactose Intolerance, Kidney Stones, Diverticulitis, Anemia, Incontinence    Examination-Activity Limitations Bed Mobility;Reach Overhead;Sit;Locomotion Level;Stand;Transfers;Stairs    Examination-Participation Restrictions Community Activity;Driving    Stability/Clinical Decision Making Stable/Uncomplicated    Rehab Potential Good    PT Frequency 2x / week    PT Duration 6 weeks    PT Treatment/Interventions ADLs/Self Care Home Management;Electrical Stimulation;Moist Heat;Cryotherapy;DME Instruction;Gait training;Stair training;Functional mobility training;Therapeutic activities;Balance training;Therapeutic exercise;Neuromuscular re-education;Patient/family education;Orthotic Fit/Training;Passive range of motion;Vestibular    PT Next Visit Plan Continue use of Bioness with gait and SPC. NMR/strengthening. continue working with [facility] with quad tip. Continue high level balance, weight shift  activites, RLE strengthening.    Consulted and Agree with Plan of Care Patient;Family member/caregiver    Family Member Consulted Husband             Patient will benefit from skilled therapeutic intervention in order to improve the following deficits and impairments:  Abnormal gait, Decreased activity tolerance, Decreased endurance, Decreased knowledge of use of DME, Decreased range of motion, Decreased strength, Impaired UE functional use, Difficulty walking, Decreased balance, Decreased safety awareness, Decreased coordination  Visit Diagnosis: Other abnormalities of gait and mobility  Muscle weakness (generalized)  Unsteadiness on feet  Difficulty in walking, not elsewhere classified     Problem List Patient Active Problem List   Diagnosis Date Noted   Itching of both hands 10/20/2020   Sensation of cold in lower extremity 10/20/2020   Dysesthesia 09/21/2020   Hemiparesis due to recent stroke ([place]) 08/30/2020   Right sided weakness 08/29/2020   Pain in lateral portion of right ankle 07/20/2020   Plantar fasciitis of right foot 05/19/2020   Small vessel disease, cerebrovascular    Overactive bladder 10/24/2018   Amnesia memory loss 05/19/2017   Overweight (BMI 25.0-29.9) 05/09/2017   Lumbar herniated disc 02/01/2017   Medicare annual wellness visit, subsequent 05/03/2016   Lactose intolerance in adult    History of kidney stones    Cervical pain (neck) 12/04/2015   Health maintenance examination 04/29/2014   Paresthesia 04/29/2014   Advanced care planning/counseling discussion 04/29/2014   PFD (pelvic floor dysfunction) 04/29/2014   Controlled diabetes mellitus type 2 with complications ([place]) 63/84/6659   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   Hx of diverticulitis of colon 11/07/2012   Allergic rhinitis 11/07/2012   Hyperlipidemia associated with type 2 diabetes mellitus ([place]) 11/07/2012  Migraine 11/07/2012   Hereditary and idiopathic peripheral neuropathy  11/07/2012   LGSIL Pap smear of vaginal cuff 11/07/2012   GERD (gastroesophageal reflux disease) 11/07/2012   Family history of premature coronary heart disease 11/07/2012    Jones Bales, PT, DPT 11/20/2020, 12:41 PM  [facility] [address] [place], [place], [address] Phone: [phone]   Fax:  [phone]  Name: ALISON KUBICKI MRN: [id number] Date of Birth: 1945/06/18

## 2020-11-20 NOTE — Therapy (Signed)
[facility] [address] [place], [name], [address] Phone: [phone]   Fax:  [phone]  Occupational Therapy Treatment  Patient Details  Name: Dana [name] MRN: [id number] Date of Birth: [birth year] Referring Provider (OT): [name] MD - follows up with [place] SUMMARY  Current functional level related to goals / functional outcomes: Pt demonstrates good overall progress, see goals.Pt met all long term goals.   Remaining deficits: Decreased strength, decreased balance   Education / Equipment: Pt was educated regarding HEP. She demonstrates understanding.   Patient agrees to discharge. Patient goals were met. Patient is being discharged due to being pleased with the current functional level..     Encounter Date: 11/20/2020   OT End of Session - 11/20/20 1132     Visit Number 9    Number of Visits 13    Date for OT Re-Evaluation 12/01/20    Authorization Type [employer]    Authorization Time Period VL:[place] $20 copay per day    Authorization - Visit Number 8   corrected count   Authorization - Number of Visits 10    OT Start Time 1104    OT Stop Time 1130    OT Time Calculation (min) 26 min    Activity Tolerance Patient tolerated treatment well    Behavior During Therapy WFL for tasks assessed/performed             Past Medical History:  Diagnosis Date   Allergy    Anemia    Arthritis    Cataract    Controlled type 2 diabetes mellitus without complication, without long-term current use of insulin ([place])    Diverticulitis    History of chicken pox    Hyperlipidemia    Kidney stone    Lactose intolerance in adult    Migraine    Urge incontinence 04/29/2014    Past Surgical History:  Procedure Laterality Date   ABDOMINAL HYSTERECTOMY  2006   cervix remains, menorrhagia   [place]   right milk duct removed   CARPAL TUNNEL RELEASE Right 02/2017   CHOLECYSTECTOMY  1971   COLONOSCOPY  11/2005   diverticulosis o/w WNL [facility])   DEXA  2015   spine -0.4, hip -1.0 WNL   LITHOTRIPSY     LUMBAR EPIDURAL INJECTION Left 10/2017   transforaminal L L3/4 (Dr Maryjean Ka)   TUBAL LIGATION      There were no vitals filed for this visit.   Subjective Assessment - 11/20/20 1153     Subjective  Denies pain    Pertinent History Migraines, HLD, DM, GERD, Lactose Intolerance, Kidney Stones, Diverticulitis, Anemia, Incontinence    Limitations no driving. fall risk.    Patient Stated Goals "a lot of trouble with fine motor skills" "writing"    Currently in Pain? No/denies                        Treatment: arm bike x 6 mins level 3 for conditioning          OT Education - 11/20/20 1148     Education Details reviewed yellow theraband exercises 10 reps each, min v.c for proper positioning, checked progress towards goals and discussed plans for d/c - pt agrees.    Person(s) Educated Patient    Methods Explanation;Demonstration;Verbal cues    Comprehension Returned  demonstration;Verbalized understanding              OT Short Term Goals - 11/20/20 1155       OT SHORT TERM GOAL #1   Title Pt will be independent with HEP or coordination and grip strength in RUE    Time 3    Period Weeks    Status Achieved   issued coordination HEP 10/08/20   Target Date 10/27/20      OT SHORT TERM GOAL #2   Title Pt will write a sentence with 90% legibility with use of adapted strategies and/or equipment PRN.    Baseline 50% of prior level of handwriting    Time 3    Period Weeks    Status Achieved   100%     OT SHORT TERM GOAL #3   Title Pt will increased fine motor coordination in RUE by completing 9 hole peg test in 27 seconds or less.    Baseline RUE 30, LUE 20.1    Time 3    Period Weeks    Status Achieved   10/19/20 - 24 sec w/ RUE     OT SHORT TERM  GOAL #4   Title Pt will safely navigate in kitchen with mod I with no LOB and with good walker management.    Time 3    Period Weeks    Status Achieved               OT Long Term Goals - 11/20/20 1200       OT LONG TERM GOAL #1   Title Pt will complete FOTO at discharge and score at least 70%    Baseline 57%    Time 8    Period Weeks    Status Achieved   77.1     OT LONG TERM GOAL #2   Title Pt will be independent with any updated HEPs    Time 8    Period Weeks    Status Achieved      OT LONG TERM GOAL #3   Title Pt will improve grip strength in RUE by 5 lbs or more in order to increase strength in dominant hand.    Baseline R 28.6, L 35.7    Time 8    Period Weeks    Status Achieved   40.3 lbs with RUE 10/29/20     OT LONG TERM GOAL #4   Title Pt will improve fine motor coordination in RUE by 5 seconds on 9 hole peg test    Baseline R 30s L 20.1    Time 8    Period Weeks    Status Achieved   10/19/20 - 24 sec RUE     OT LONG TERM GOAL #5   Title Pt will plan and execute a simple meal for she and her spouse with min assistance from spouse.    Time 8    Period Weeks    Status Achieved   has been cooking for spouse 11/11/20                  Plan - 11/20/20 1147     Clinical Impression Statement Pt demonstrates good overall progress. she agrees with palns for d/c.    OT Occupational Profile and History Problem Focused Assessment - Including review of records relating to presenting problem    Occupational performance deficits (Please refer to evaluation for details): IADL's;ADL's;Leisure    Body Structure / Function / Physical Skills ADL;FMC;Coordination;Flexibility;IADL;ROM;UE  functional use;Pain;Dexterity;Strength;Decreased knowledge of use of DME    Cognitive Skills Problem Solve    Rehab Potential Good    Clinical Decision Making Limited treatment options, no task modification necessary    Comorbidities Affecting Occupational Performance: None     Modification or Assistance to Complete Evaluation  No modification of tasks or assist necessary to complete eval    OT Frequency 2x / week    OT Duration 8 weeks   12 visits over 8 weeks to account for any missed visits or scheduling difficulties.   OT Treatment/Interventions Self-care/ADL training;Functional Furniture conservator/restorer;Therapeutic exercise;Cognitive remediation/compensation;Patient/family education;Energy conservation;Neuromuscular education;DME and/or AE instruction;Therapeutic activities;Moist Heat    Plan d/c OT    Consulted and Agree with Plan of Care Patient             Patient will benefit from skilled therapeutic intervention in order to improve the following deficits and impairments:   Body Structure / Function / Physical Skills: ADL, FMC, Coordination, Flexibility, IADL, ROM, UE functional use, Pain, Dexterity, Strength, Decreased knowledge of use of DME Cognitive Skills: Problem Solve     Visit Diagnosis: Muscle weakness (generalized)  Unsteadiness on feet  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)  Other abnormalities of gait and mobility  Other lack of coordination    Problem List Patient Active Problem List   Diagnosis Date Noted   Itching of both hands 10/20/2020   Sensation of cold in lower extremity 10/20/2020   Dysesthesia 09/21/2020   Hemiparesis due to recent stroke ([place]) 08/30/2020   Right sided weakness 08/29/2020   Pain in lateral portion of right ankle 07/20/2020   Plantar fasciitis of right foot 05/19/2020   Small vessel disease, cerebrovascular    Overactive bladder 10/24/2018   Amnesia memory loss 05/19/2017   Overweight (BMI 25.0-29.9) 05/09/2017   Lumbar herniated disc 02/01/2017   Medicare annual wellness visit, subsequent 05/03/2016   Lactose intolerance in adult    History of kidney stones    Cervical pain (neck) 12/04/2015   Health maintenance examination 04/29/2014    Paresthesia 04/29/2014   Advanced care planning/counseling discussion 04/29/2014   PFD (pelvic floor dysfunction) 04/29/2014   Controlled diabetes mellitus type 2 with complications ([place]) 86/76/7209   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   Hx of diverticulitis of colon 11/07/2012   Allergic rhinitis 11/07/2012   Hyperlipidemia associated with type 2 diabetes mellitus ([place]) 11/07/2012   Migraine 11/07/2012   Hereditary and idiopathic peripheral neuropathy 11/07/2012   LGSIL Pap smear of vaginal cuff 11/07/2012   GERD (gastroesophageal reflux disease) 11/07/2012   Family history of premature coronary heart disease 11/07/2012    [place] [name] 11/20/2020, 12:01 PM Theone Murdoch, OTR/L [phone] Phone: [phone] 12:01 PM 11/20/20  [name], [name], [name] Phone: [phone]   Fax:  [phone]  Name: Dana [name] MRN: [id number] Date of Birth: 06-03-1945

## 2020-11-25 ENCOUNTER — Ambulatory Visit: Payer: Medicare HMO

## 2020-11-26 ENCOUNTER — Ambulatory Visit: Payer: Medicare HMO

## 2020-11-26 DIAGNOSIS — I6339 Cerebral infarction due to thrombosis of other cerebral artery: Secondary | ICD-10-CM | POA: Diagnosis not present

## 2020-11-26 DIAGNOSIS — Z9181 History of falling: Secondary | ICD-10-CM

## 2020-11-26 NOTE — Chronic Care Management (AMB) (Signed)
Chronic Care Management   CCM RN Visit Note  11/26/2020 Name: Dana [name] MRN: [id number] DOB: 1945/06/22  Subjective: Dana [name] is a 75 y.o. year old female who is a primary care patient of [name], MD. The care management team was consulted for assistance with disease management and care coordination needs.    Engaged with patient by telephone for initial visit in response to provider referral for case management and/or care coordination services.   Consent to Services:  The patient was given the following information about Chronic Care Management services today, agreed to services, and gave verbal consent: 1. CCM service includes personalized support from designated clinical staff supervised by the primary care provider, including individualized plan of care and coordination with other care providers 2. 24/7 contact phone numbers for assistance for urgent and routine care needs. 3. Service will only be billed when office clinical staff spend 20 minutes or more in a month to coordinate care. 4. Only one practitioner may furnish and bill the service in a calendar month. 5.The patient may stop CCM services at any time (effective at the end of the month) by phone call to the office staff. 6. The patient will be responsible for cost sharing (co-pay) of up to 20% of the service fee (after annual deductible is met). Patient agreed to services and consent obtained.  Patient agreed to services and verbal consent obtained.    Assessment: Patient is status post stroke, current symptoms of lightheadedness, at risk for falls.  See Care Plan below for interventions and patient self-care actives. Follow up Plan: Patient would like continued follow-up.  CCM will outreach the patient within the next 45 days.   Patient will call office if needed prior to next encounter  Review of patient past medical history, allergies, medications, health status, including review of consultants reports,  laboratory and other test data, was performed as part of comprehensive evaluation and provision of chronic care management services.   SDOH (Social Determinants of Health) assessments and interventions performed:  SDOH Interventions    Flowsheet Row Most Recent Value  SDOH Interventions   Food Insecurity Interventions Intervention Not Indicated  Housing Interventions Intervention Not Indicated  Transportation Interventions Intervention Not Indicated        CCM Care Plan  Allergies  Allergen Reactions   Amitriptyline Other (See Comments)    Worsened migraine and confusion   Lactose Intolerance (Gi) Nausea Only and Other (See Comments)    Abdominal pain, also   Oxybutynin Other (See Comments)    Urinary retention/hesitancy   Tape Other (See Comments)    "Band-aids make my skin red"   Tramadol Other (See Comments)    Causes confusion and dizziness   Niacin And Related Itching   Septra [Sulfamethoxazole-Trimethoprim] Itching    Outpatient Encounter Medications as of 11/26/2020  Medication Sig Note   acetaminophen (TYLENOL) 325 MG tablet Take 1 tablet (325 mg total) by mouth every 6 (six) hours as needed for mild pain. 10/02/2020: ALL MEDICATIONS MUST BE TAKEN WITH APPLESAUCE   atorvastatin (LIPITOR) 40 MG tablet TAKE 1 TABLET (40 MG TOTAL) BY MOUTH DAILY. (Patient taking differently: Take 40 mg by mouth at bedtime.) 10/02/2020: ALL MEDICATIONS MUST BE TAKEN WITH APPLESAUCE   butalbital-acetaminophen-caffeine (FIORICET) 50-325-40 MG tablet Take 1 tablet by mouth every 6 (six) hours as needed for headache. 10/02/2020: ALL MEDICATIONS MUST BE TAKEN WITH APPLESAUCE   clopidogrel (PLAVIX) 75 MG tablet Take 1 tablet (75 mg total) by mouth daily.  co-enzyme Q-10 30 MG capsule Take 100 mg by mouth daily. 10/02/2020: ALL MEDICATIONS MUST BE TAKEN WITH APPLESAUCE   fluticasone (FLONASE) 50 MCG/ACT nasal spray USE 2 SPRAYS IN EACH NOSTRIL EVERY DAY (Patient taking differently: Place 2 sprays  into both nostrils at bedtime.)    gabapentin (NEURONTIN) 100 MG capsule Take 1-2 capsules (100-200 mg total) by mouth at bedtime as needed. (Patient taking differently: Take 100-200 mg by mouth at bedtime.) 10/02/2020: ALL MEDICATIONS MUST BE TAKEN WITH APPLESAUCE   lactase (LACTAID) 3000 units tablet Take 2 tablets (6,000 Units total) by mouth 3 (three) times daily with meals. (Patient taking differently: Take 6,000 Units by mouth See admin instructions. Take 6,000 units (2 tablets) by mouth up to three times a day WITH DAIRY PRODUCTS) 10/02/2020: ALL MEDICATIONS MUST BE TAKEN WITH APPLESAUCE   methocarbamol (ROBAXIN) 500 MG tablet TAKE 1 TABLET (500 MG TOTAL) BY MOUTH 3 (THREE) TIMES DAILY AS NEEDED FOR MUSCLE SPASMS (SEDATION PRECAUTIONS). (Patient taking differently: Take 500 mg by mouth 3 (three) times daily as needed for muscle spasms (or migraines- sedation precautions).) 10/02/2020: ALL MEDICATIONS MUST BE TAKEN WITH APPLESAUCE   Multiple Vitamin (MULTIVITAMIN) tablet Take 1 tablet by mouth at bedtime. 10/02/2020: ALL MEDICATIONS MUST BE TAKEN WITH APPLESAUCE   omeprazole (PRILOSEC) 20 MG capsule Take 1 capsule (20 mg total) by mouth daily. (Patient taking differently: Take 20 mg by mouth daily before breakfast.) 10/02/2020: ALL MEDICATIONS MUST BE TAKEN WITH APPLESAUCE   ondansetron (ZOFRAN ODT) 4 MG disintegrating tablet Take 1 tablet (4 mg total) by mouth every 8 (eight) hours as needed. (Patient taking differently: Take 4 mg by mouth every 8 (eight) hours as needed for vomiting or nausea (dissolve orally).)    THERATEARS PF 0.25 % SOLN Place 2 drops into both eyes in the morning. 11/26/2020: Patient states she takes as needed   topiramate (TOPAMAX) 25 MG tablet Take 3 tablets (75 mg total) by mouth at bedtime. (Patient taking differently: Take 25-50 mg by mouth See admin instructions. Take 25 mg by mouth in the morning and 50 mg at bedtime) 10/02/2020: ALL MEDICATIONS MUST BE TAKEN WITH APPLESAUCE    triamcinolone cream (KENALOG) 0.1 % APPLY TO AFFECTED AREA TWICE A DAY (Patient taking differently: Apply 1 application topically 2 (two) times daily as needed (to affected areas under the breasts- for irritation).)    vitamin B-12 (CYANOCOBALAMIN) 1000 MCG tablet Take 1 tablet (1,000 mcg total) by mouth every Monday, Wednesday, and Friday. 10/02/2020: ALL MEDICATIONS MUST BE TAKEN WITH APPLESAUCE   Alcohol Swabs (B-D SINGLE USE SWABS REGULAR) PADS Check blood sugar once daily    Blood Glucose Calibration (TRUE METRIX LEVEL 1) Low SOLN Use to check meter    Blood Glucose Monitoring Suppl (TRUE METRIX METER) w/Device KIT Check blood sugar once daily    clopidogrel (PLAVIX) 75 MG tablet Take 1 tablet (75 mg total) by mouth daily.    ezetimibe (ZETIA) 10 MG tablet Take 1 tablet (10 mg total) by mouth daily. (Patient not taking: Reported on 11/26/2020)    glucose blood (TRUE METRIX BLOOD GLUCOSE TEST) test strip Check blood sugar once daily    TRUEplus Lancets 33G MISC Check blood sugar once daily    No facility-administered encounter medications on file as of 11/26/2020.    Patient Active Problem List   Diagnosis Date Noted   Itching of both hands 10/20/2020   Sensation of cold in lower extremity 10/20/2020   Dysesthesia 09/21/2020   Hemiparesis due to  recent stroke ([place]) 08/30/2020   Right sided weakness 08/29/2020   Pain in lateral portion of right ankle 07/20/2020   Plantar fasciitis of right foot 05/19/2020   Small vessel disease, cerebrovascular    Overactive bladder 10/24/2018   Amnesia memory loss 05/19/2017   Overweight (BMI 25.0-29.9) 05/09/2017   Lumbar herniated disc 02/01/2017   Medicare annual wellness visit, subsequent 05/03/2016   Lactose intolerance in adult    History of kidney stones    Cervical pain (neck) 12/04/2015   Health maintenance examination 04/29/2014   Paresthesia 04/29/2014   Advanced care planning/counseling discussion 04/29/2014   PFD (pelvic floor  dysfunction) 04/29/2014   Controlled diabetes mellitus type 2 with complications ([place]) 67/67/2094   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   Hx of diverticulitis of colon 11/07/2012   Allergic rhinitis 11/07/2012   Hyperlipidemia associated with type 2 diabetes mellitus ([place]) 11/07/2012   Migraine 11/07/2012   Hereditary and idiopathic peripheral neuropathy 11/07/2012   LGSIL Pap smear of vaginal cuff 11/07/2012   GERD (gastroesophageal reflux disease) 11/07/2012   Family history of premature coronary heart disease 11/07/2012    Conditions to be addressed/monitored: stroke, Fall  Care Plan : Stroke (Adult)  Updates made by Dannielle Karvonen, RN since 11/26/2020 12:00 AM   Problem: Harm or Injury (Stroke, at risk for falls)   Priority: High   Long-Range Goal: Harm or Injury Prevented   Start Date: 11/26/2020  Expected End Date: 03/08/2021  This Visit's Progress: On track  Priority: High  Current Barriers:  Knowledge Deficits related to long term care plan for self management of Stroke / risk for falls (Recent stroke with right sided hemiparesis on 08/29/2020. Patient receiving outpatient physical therapy treatment 2 x per week. Mobility/Transportation/ errands:  Patient requires walker for ambulation. She requires assistance with errands/ transportation.  Patients husband provides assistance.  Lightheadedness: Patient reports experiencing lightheadedness off and on since her stroke. She reports her doctor recently discontinued her Zetia.  She reports checking her blood pressure when she has the lightheadedness as recommended.   Most recent blood pressure readings: 117/76, 107/77 Clinical Goal(s):  patient will demonstrate improved adherence to prescribed treatment plan for decreasing falls as evidenced by patient reporting and review of EMR patient will verbalize using fall risk reduction strategies discussed patient will take medications as prescribed. Patient will attend scheduled follow up  visits.   Patient will know the signs/ symptoms of stroke symptoms and know to call 911 at the onset.  Patient will follow a low salt meal plan.  Patient will increase activity as tolerated and as recommended by her doctor Interventions:  Collaboration with [name], MD regarding development and update of comprehensive plan of care as evidenced by provider attestation and co-signature Inter-disciplinary care team collaboration (see longitudinal plan of care) Provided written and verbal education re: Potential causes of falls and Fall prevention strategies Reviewed medications and discussed potential side effects of medications such as dizziness and frequent urination Assessed for s/s of orthostatic hypotension Assessed for falls since last encounter. Assessed for signs/ symptoms of stroke Sent patient education information on stroke, low salt diet, fall prevention in MyChart Discussed signs of bleeding due to patient taking blood thinner.  Patient Goals:  - Utilize walker appropriately with all ambulation - De-clutter walkways - Change positions slowly - Wear secure fitting shoes at all times with ambulation - Utilize home lighting for dim lit areas - Demonstrate self and pet awareness at all times -Review education information sent to  you in my chart on stroke, low salt diet, and fall prevention -Increase activity as tolerated and as recommended by your provider.  -continue with outpatient physical therapy -Take your medications as prescribed. ( Be mindful while taking a blood thinner notify your doctor for bleeding from gums, bruising, blood in urine) - Follow up with your doctor as recommended.  Follow Up Plan: The patient has been provided with contact information for the care management team and has been advised to call with any health related questions or concerns.  The care management team will reach out to the patient again over the next 45 days.       Plan:The  patient has been provided with contact information for the care management team and has been advised to call with any health related questions or concerns.  and The care management team will reach out to the patient again over the next 45 days. Quinn Plowman RN,BSN,CCM RN Case Manager [place]  [phone]

## 2020-11-26 NOTE — Patient Instructions (Signed)
Visit Information:  Thank your for taking the time to speak with me today.   PATIENT GOALS:   Goals Addressed             This Visit's Progress    Harm or Injury Prevented (stroke, at risk for falls)   On track    Timeframe:  Long-Range Goal Priority:  High Start Date:      11/26/2020                       Expected End Date: 03/08/2021  Follow up:   01/04/2021                    - Utilize walker appropriately with all ambulation - De-clutter walkways - Change positions slowly - Wear secure fitting shoes at all times with ambulation - Utilize home lighting for dim lit areas - Demonstrate self and pet awareness at all times -Review education information sent to you in my chart on stroke, low salt diet, and fall prevention -Increase activity as tolerated and as recommended by your provider.  -continue with outpatient physical therapy -Take your medications as prescribed. ( Be mindful while taking a blood thinner notify your doctor for bleeding from gums, bruising, blood in urine) - Follow up with your doctor as recommended.         Consent to CCM Services: Dana [name] was given information about Chronic Care Management services today including:  CCM service includes personalized support from designated clinical staff supervised by her physician, including individualized plan of care and coordination with other care providers 24/7 contact phone numbers for assistance for urgent and routine care needs. Service will only be billed when office clinical staff spend 20 minutes or more in a month to coordinate care. Only one practitioner may furnish and bill the service in a calendar month. The patient may stop CCM services at any time (effective at the end of the month) by phone call to the office staff. The patient will be responsible for cost sharing (co-pay) of up to 20% of the service fee (after annual deductible is met).  Patient agreed to services and verbal consent obtained.    Patient verbalizes understanding of instructions provided today and agrees to view in [place].   The patient has been provided with contact information for the care management team and has been advised to call with any health related questions or concerns.  The care management team will reach out to the patient again over the next 45 days.   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 Patient receiving outpatient physical therapy treatment 2 x per week. Mobility/Transportation/ errands:  Patient requires walker for ambulation. She requires assistance with errands/ transportation.  Patients husband provides assistance.  Lightheadedness: Patient reports experiencing lightheadedness off and on since her stroke. She reports her doctor recently discontinued her Zetia.  She reports checking her blood pressure when she has the lightheadedness  as recommended.   Most recent blood pressure readings: 117/76, 107/77 Clinical Goal(s):  patient will demonstrate improved adherence to prescribed treatment plan for decreasing falls as evidenced by patient reporting and review of EMR patient will verbalize using fall risk reduction strategies discussed patient will take medications as prescribed. Patient will attend scheduled follow up visits.   Patient will know the signs/ symptoms of stroke symptoms and know to call 911 at the onset.  Patient will follow a low salt meal plan.  Patient will increase  activity as tolerated and as recommended by her doctor Interventions:  Collaboration with [name], MD regarding development and update of comprehensive plan of care as evidenced by provider attestation and co-signature Inter-disciplinary care team collaboration (see longitudinal plan of care) Provided written and verbal education re: Potential causes of falls and Fall prevention strategies Reviewed medications and discussed potential side effects of medications such as dizziness and frequent urination Assessed for s/s of orthostatic hypotension Assessed for falls since last encounter. Assessed for signs/ symptoms of stroke Sent patient education information on stroke, low salt diet, fall prevention in MyChart Discussed signs of bleeding due to patient taking blood thinner.  Patient Goals:  - Utilize walker appropriately with all ambulation - De-clutter walkways - Change positions slowly - Wear secure fitting shoes at all times with ambulation - Utilize home lighting for dim lit areas - Demonstrate self and pet awareness at all times -Review education information sent to you in my chart on stroke, low salt diet, and fall prevention -Increase activity as tolerated and as recommended by your provider.  -continue with outpatient physical therapy -Take your medications as prescribed. ( Be mindful while taking a blood thinner notify your doctor for bleeding from gums, bruising, blood in urine) - Follow up with your doctor as recommended.  Follow Up Plan: The patient has been provided with contact information for the care management team and has been advised to call with any health related questions or concerns.  The care management team will reach out to the patient again over the next 45 days.

## 2020-11-27 ENCOUNTER — Ambulatory Visit: Payer: Medicare HMO | Admitting: Physical Therapy

## 2020-11-27 ENCOUNTER — Other Ambulatory Visit: Payer: Self-pay

## 2020-11-27 DIAGNOSIS — M6281 Muscle weakness (generalized): Secondary | ICD-10-CM | POA: Diagnosis not present

## 2020-11-27 DIAGNOSIS — I69851 Hemiplegia and hemiparesis following other cerebrovascular disease affecting right dominant side: Secondary | ICD-10-CM

## 2020-11-27 DIAGNOSIS — R2681 Unsteadiness on feet: Secondary | ICD-10-CM | POA: Diagnosis not present

## 2020-11-27 DIAGNOSIS — R2689 Other abnormalities of gait and mobility: Secondary | ICD-10-CM | POA: Diagnosis not present

## 2020-11-27 DIAGNOSIS — R262 Difficulty in walking, not elsewhere classified: Secondary | ICD-10-CM

## 2020-11-27 DIAGNOSIS — R278 Other lack of coordination: Secondary | ICD-10-CM | POA: Diagnosis not present

## 2020-11-27 NOTE — Therapy (Signed)
[facility] [address] [place], [place], [address] Phone: [phone]   Fax:  [phone]  Physical Therapy Treatment  Patient Details  Name: Dana [name] MRN: [id number] Date of Birth: [birth year] Referring Provider (PT): Referred by: [name], NP. PCP is [name], MD   Encounter Date: 11/27/2020   PT End of Session - 11/27/20 1224     Visit Number 12    Number of Visits 21    Date for PT Re-Evaluation 12/15/20    Authorization Type [employer] (10th Visit PN)    Authorization Time Period Auth: 09/16/2020 - 10/29/2020; Awaiting New Authorization    Progress Note Due on Visit 19   completed on 9th visit   PT Start Time 1145    PT Stop Time 1230    PT Time Calculation (min) 45 min    Equipment Utilized During Treatment Gait belt    Activity Tolerance Patient tolerated treatment well    Behavior During Therapy WFL for tasks assessed/performed             Past Medical History:  Diagnosis Date   Allergy    Anemia    Arthritis    Cataract    Controlled type 2 diabetes mellitus without complication, without long-term current use of insulin (HCC)    Diverticulitis    History of chicken pox    Hyperlipidemia    Kidney stone    Lactose intolerance in adult    Migraine    Stroke [facility])    Urge incontinence 04/29/2014    Past Surgical History:  Procedure Laterality Date   ABDOMINAL HYSTERECTOMY  2006   cervix remains, menorrhagia   [place]   right milk duct removed   CARPAL TUNNEL RELEASE Right 02/2017   CHOLECYSTECTOMY  1971   COLONOSCOPY  11/2005   diverticulosis o/w WNL [facility])   DEXA  2015   spine -0.4, hip -1.0 WNL   LITHOTRIPSY     LUMBAR EPIDURAL INJECTION Left 10/2017   transforaminal L L3/4 (Dr Maryjean Ka)   TUBAL LIGATION      There were no vitals filed for this visit.   Subjective Assessment -  11/27/20 1149     Subjective Pt states BP has been okay. Pt states her lightheadedness is doing okay today.    Patient is accompained by: Family member   Husband [name])   Pertinent History Migraines, HLD, DM, GERD, Lactose Intolerance, Kidney Stones, Diverticulitis, Anemia, Incontinence    Limitations Sitting;Standing;Walking    How long can you walk comfortably? 10 minutes    Patient Stated Goals Walk without Assistance;    Currently in Pain? No/denies                               [facility] Adult PT Treatment/Exercise - 11/27/20 0001       Ambulation/Gait   Ambulation Distance (Feet) 250 Feet      Knee/Hip Exercises: Stretches   Gastroc Stretch 30 seconds    Soleus Stretch 30 seconds      Knee/Hip Exercises: Seated   Other Seated Knee/Hip Exercises Rockerboard for ankle DF x10 on R                 Balance Exercises - 11/27/20 0001       Balance Exercises: Standing   SLS with  Vectors Foam/compliant surface   Tap forward on therastone 2x10; tap sideways on therastone 2x10; backwards tap on therastone 2x10   Rockerboard Anterior/posterior;EO;EC;20 seconds;Intermittent UE support;Limitations;Head turns    Rockerboard Limitations standing on rockerboard A/P forward/backward 2x10; standing on rockerboard A/P with EO maintaining steady x 1 minute, then progressed to horizontal head turns x 10 reps. Completed EC 1 x 1 min. Rocker board lateral L<>R 2x10; standing on rockerboard laterally with EO maintianing steady x 1 min, then progressing to horizontal head turns x10 reps, EC 1x1 min. Increased postural sway noted iwth vision removed. intermittent touchA to // bars and CGA from PT.    Retro Gait Upper extremity support   3 trips next to counter                PT Short Term Goals - 11/27/20 1155       PT SHORT TERM GOAL #1   Title Patient will be able to ambulate x 250 ft with SPC vs No AD with superivison for improved household/community mobility     Baseline continue use of RW    Time 3    Period Weeks    Status Achieved    Target Date 11/19/20               PT Long Term Goals - 10/29/20 1246       PT LONG TERM GOAL #1   Title Patient will be independent with final HEP for balance/strength and walking program (All LTGs Due: 12/15/20)    Baseline no HEP established; reports independence, continue to progress HEP at this time, not completing daily    Time 6    Period Weeks    Status On-going    Target Date 12/15/20      PT LONG TERM GOAL #2   Title Patient will improve gait speed to >/= 2.5 ft/sec with LRAD    Baseline 1.19 ft/sec, 2.42 ft/sec with RW    Time 6    Period Weeks    Status Revised      PT LONG TERM GOAL #3   Title Patient will improve Berg Balance to >/= 53/56 to indicate improved balance and reduced fall risk    Baseline 34/56, 48/56    Time 6    Period Weeks    Status Revised      PT LONG TERM GOAL #4   Title Patient will improve TUG to </= 12 secs w/ LRAD to indicate reduced fall risk    Baseline 23.56 secs; 14.13 secs w/ RW    Time 6    Period Weeks    Status Revised      PT LONG TERM GOAL #5   Title Patient will be able to ambulate x 300 ft outdoors on unlevel surfaces w/ LRAD and supervision for improved community mobility    Baseline TBA    Time 6    Period Weeks    Status Revised      PT LONG TERM GOAL #6   Title Patient will demo ability to ascend/descend 12 stairs with single rail, alternating pattern, and supervision for improved ability to ambulate to second floor of home    Baseline not currently completing; continue to use bilat rails, alternating ascend, step to descend supervision    Time 6    Period Weeks    Status Revised      PT LONG TERM GOAL #7   Title Patient will improve FOTO to >/= 65%  Baseline 49%; continue with PT, unable to capture due to time constraints    Time 6    Period Weeks    Status On-going                   Plan - 11/27/20 1240      Clinical Impression Statement Pt has met STG. Pt demos trunk extension when performing R LE swing -- cues to maintain upright posture. Continued to work on R LE weight shifting and balance this session to promote quadriceps activation in SLS. Updated pt's HEP.    Personal Factors and Comorbidities Comorbidity 3+    Comorbidities Migraines, HLD, DM, GERD, Lactose Intolerance, Kidney Stones, Diverticulitis, Anemia, Incontinence    Examination-Activity Limitations Bed Mobility;Reach Overhead;Sit;Locomotion Level;Stand;Transfers;Stairs    Examination-Participation Restrictions Community Activity;Driving    Stability/Clinical Decision Making Stable/Uncomplicated    Rehab Potential Good    PT Frequency 2x / week    PT Duration 6 weeks    PT Treatment/Interventions ADLs/Self Care Home Management;Electrical Stimulation;Moist Heat;Cryotherapy;DME Instruction;Gait training;Stair training;Functional mobility training;Therapeutic activities;Balance training;Therapeutic exercise;Neuromuscular re-education;Patient/family education;Orthotic Fit/Training;Passive range of motion;Vestibular    PT Next Visit Plan Continue use of Bioness with gait and SPC. NMR/strengthening. continue working with [facility] with quad tip. Continue high level balance, weight shift activites, RLE strengthening.    PT Home Exercise Plan Access Code: [email or web address]    Consulted and Agree with Plan of Care Patient;Family member/caregiver    Family Member Consulted Husband             Patient will benefit from skilled therapeutic intervention in order to improve the following deficits and impairments:  Abnormal gait, Decreased activity tolerance, Decreased endurance, Decreased knowledge of use of DME, Decreased range of motion, Decreased strength, Impaired UE functional use, Difficulty walking, Decreased balance, Decreased safety awareness, Decreased coordination  Visit Diagnosis: Other abnormalities of gait and mobility  Muscle weakness  (generalized)  Unsteadiness on feet  Difficulty in walking, not elsewhere classified  Hemiplegia and hemiparesis following other cerebrovascular disease affecting right dominant side ([place])  Other lack of coordination     Problem List Patient Active Problem List   Diagnosis Date Noted   Itching of both hands 10/20/2020   Sensation of cold in lower extremity 10/20/2020   Dysesthesia 09/21/2020   Hemiparesis due to recent stroke ([place]) 08/30/2020   Right sided weakness 08/29/2020   Pain in lateral portion of right ankle 07/20/2020   Plantar fasciitis of right foot 05/19/2020   Small vessel disease, cerebrovascular    Overactive bladder 10/24/2018   Amnesia memory loss 05/19/2017   Overweight (BMI 25.0-29.9) 05/09/2017   Lumbar herniated disc 02/01/2017   Medicare annual wellness visit, subsequent 05/03/2016   Lactose intolerance in adult    History of kidney stones    Cervical pain (neck) 12/04/2015   Health maintenance examination 04/29/2014   Paresthesia 04/29/2014   Advanced care planning/counseling discussion 04/29/2014   PFD (pelvic floor dysfunction) 04/29/2014   Controlled diabetes mellitus type 2 with complications ([place]) 16/38/4665   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   Hx of diverticulitis of colon 11/07/2012   Allergic rhinitis 11/07/2012   Hyperlipidemia associated with type 2 diabetes mellitus ([place]) 11/07/2012   Migraine 11/07/2012   Hereditary and idiopathic peripheral neuropathy 11/07/2012   LGSIL Pap smear of vaginal cuff 11/07/2012   GERD (gastroesophageal reflux disease) 11/07/2012   Family history of premature coronary heart disease 11/07/2012    Muath Hallam April Ma L Deshan Hemmelgarn PT, DPT 11/27/2020, 12:44 PM  [place]  [facility] [address] [place], [place], [address] Phone: [phone]   Fax:  [phone]  Name: DAVA RENSCH MRN: [id number] Date of Birth: 14-Feb-1946

## 2020-11-29 ENCOUNTER — Other Ambulatory Visit: Payer: Self-pay | Admitting: Neurology

## 2020-11-30 ENCOUNTER — Other Ambulatory Visit: Payer: Self-pay

## 2020-11-30 ENCOUNTER — Ambulatory Visit: Payer: Medicare HMO

## 2020-11-30 VITALS — BP 104/74 | HR 66

## 2020-11-30 DIAGNOSIS — M6281 Muscle weakness (generalized): Secondary | ICD-10-CM | POA: Diagnosis not present

## 2020-11-30 DIAGNOSIS — R262 Difficulty in walking, not elsewhere classified: Secondary | ICD-10-CM | POA: Diagnosis not present

## 2020-11-30 DIAGNOSIS — I69851 Hemiplegia and hemiparesis following other cerebrovascular disease affecting right dominant side: Secondary | ICD-10-CM | POA: Diagnosis not present

## 2020-11-30 DIAGNOSIS — R2689 Other abnormalities of gait and mobility: Secondary | ICD-10-CM

## 2020-11-30 DIAGNOSIS — R278 Other lack of coordination: Secondary | ICD-10-CM | POA: Diagnosis not present

## 2020-11-30 DIAGNOSIS — R2681 Unsteadiness on feet: Secondary | ICD-10-CM

## 2020-11-30 NOTE — Therapy (Signed)
[facility] [address] [place], [place], [address] Phone: [phone]   Fax:  [phone]  Physical Therapy Treatment  Patient Details  Name: Dana [name] MRN: [id number] Date of Birth: 07/12/1945 Referring Provider (PT): Referred by: [name], NP. PCP is [name], MD   Encounter Date: 11/30/2020   PT End of Session - 11/30/20 1233     Visit Number 13    Number of Visits 21    Date for PT Re-Evaluation 12/15/20    Authorization Type [employer] (10th Visit PN)    Authorization Time Period Auth: 09/16/2020 - 10/29/2020; Awaiting New Authorization    Progress Note Due on Visit 19   completed on 9th visit   PT Start Time 1232    PT Stop Time 1312    PT Time Calculation (min) 40 min    Equipment Utilized During Treatment Gait belt    Activity Tolerance Patient tolerated treatment well    Behavior During Therapy WFL for tasks assessed/performed             Past Medical History:  Diagnosis Date   Allergy    Anemia    Arthritis    Cataract    Controlled type 2 diabetes mellitus without complication, without long-term current use of insulin ([place])    Diverticulitis    History of chicken pox    Hyperlipidemia    Kidney stone    Lactose intolerance in adult    Migraine    Stroke [facility])    Urge incontinence 04/29/2014    Past Surgical History:  Procedure Laterality Date   ABDOMINAL HYSTERECTOMY  2006   cervix remains, menorrhagia   [place]   right milk duct removed   CARPAL TUNNEL RELEASE Right 02/2017   CHOLECYSTECTOMY  1971   COLONOSCOPY  11/2005   diverticulosis o/w WNL [facility])   DEXA  2015   spine -0.4, hip -1.0 WNL   LITHOTRIPSY     LUMBAR EPIDURAL INJECTION Left 10/2017   transforaminal L L3/4 (Dr Maryjean Ka)   TUBAL LIGATION      Vitals:   11/30/20 1236  BP: 104/74  Pulse: 66     Subjective  Assessment - 11/30/20 1234     Subjective Patient reports some residual muscle soreness in the knee. Stomach is continuing to bother her. BP has been good per patient reports. No falls.    Patient is accompained by: Family member   [name])   Pertinent History Migraines, HLD, DM, GERD, Lactose Intolerance, Kidney Stones, Diverticulitis, Anemia, Incontinence    Limitations Sitting;Standing;Walking    How long can you walk comfortably? 10 minutes    Patient Stated Goals Walk without Assistance;    Currently in Pain? No/denies               [facility] Adult PT Treatment/Exercise - 11/30/20 0001       Transfers   Transfers Sit to Stand;Stand to Sit    Sit to Stand 6: Modified independent (Device/Increase time)    Stand to Sit 6: Modified independent (Device/Increase time)      Ambulation/Gait   Ambulation/Gait Yes    Ambulation/Gait Assistance 5: Supervision    Ambulation/Gait Assistance Details Completed ambulation woth use of SPCwith quad tip attachment x 400 ft, no use of bioness today. Patient demo appropriate sequencing and goo balance with AD. PT educating on purchase options  and provided patient with handout to obtain AD for use at home. PT rpoviding clearance to begin short distance ambulation with this AD within the home.    Ambulation Distance (Feet) 400 Feet    Assistive device Straight cane    Gait Pattern Step-through pattern;Decreased arm swing - right;Decreased arm swing - left;Decreased step length - right;Decreased step length - left;Decreased stance time - right;Decreased hip/knee flexion - right;Decreased dorsiflexion - right;Decreased weight shift to right;Poor foot clearance - right    Ambulation Surface Level;Indoor      Exercises   Exercises Knee/Hip      Knee/Hip Exercises: Aerobic   Nustep Completed NuStep with BLE/BUE on Level 4 x 6 minutes with cues to maintain pace >/= 50 steps per minute.               Balance Exercises - 11/30/20 0001        Balance Exercises: Standing   Retro Gait 3 reps;Limitations    Retro Gait Limitations at countertop no UE support, cues for upright posture and core activation with completion.    Sidestepping 3 reps;Limitations    Sidestepping Limitations at countertop no UE support x 3 laps down and back. cues for step length.    Marching Solid surface;Forwards;Limitations    Marching Limitations at countertop no UE support x 3 laps, cues for upright posture with completion.               PT Education - 11/30/20 1246     Education Details purchase options for [facility] with quad tip; begining to ambulate with AD at home              PT Short Term Goals - 11/27/20 1155       PT [place] #1   Title Patient will be able to ambulate x 250 ft with SPC vs No AD with superivison for improved household/community mobility    Baseline continue use of RW    Time 3    Period Weeks    Status Achieved    Target Date 11/19/20               PT Long Term Goals - 10/29/20 1246       PT LONG TERM GOAL #1   Title Patient will be independent with final HEP for balance/strength and walking program (All LTGs Due: 12/15/20)    Baseline no HEP established; reports independence, continue to progress HEP at this time, not completing daily    Time 6    Period Weeks    Status On-going    Target Date 12/15/20      PT LONG TERM GOAL #2   Title Patient will improve gait speed to >/= 2.5 ft/sec with LRAD    Baseline 1.19 ft/sec, 2.42 ft/sec with RW    Time 6    Period Weeks    Status Revised      PT LONG TERM GOAL #3   Title Patient will improve Berg Balance to >/= 53/56 to indicate improved balance and reduced fall risk    Baseline 34/56, 48/56    Time 6    Period Weeks    Status Revised      PT LONG TERM GOAL #4   Title Patient will improve TUG to </= 12 secs w/ LRAD to indicate reduced fall risk    Baseline 23.56 secs; 14.13 secs w/ RW    Time 6    Period Weeks    Status  Revised      PT  LONG TERM GOAL #5   Title Patient will be able to ambulate x 300 ft outdoors on unlevel surfaces w/ LRAD and supervision for improved community mobility    Baseline TBA    Time 6    Period Weeks    Status Revised      PT LONG TERM GOAL #6   Title Patient will demo ability to ascend/descend 12 stairs with single rail, alternating pattern, and supervision for improved ability to ambulate to second floor of home    Baseline not currently completing; continue to use bilat rails, alternating ascend, step to descend supervision    Time 6    Period Weeks    Status Revised      PT LONG TERM GOAL #7   Title Patient will improve FOTO to >/= 65%    Baseline 49%; continue with PT, unable to capture due to time constraints    Time 6    Period Weeks    Status On-going                   Plan - 11/30/20 1240     Clinical Impression Statement Continued gait training with SPC with quad tip attachment. PT educating on purchase options to allow for continued practice and use of AD within the home as patient demo improvements with safety with AD at this time. Continued activites focused on balance and functional strengthening. Will continue to progress toward all LTGs.    Personal Factors and Comorbidities Comorbidity 3+    Comorbidities Migraines, HLD, DM, GERD, Lactose Intolerance, Kidney Stones, Diverticulitis, Anemia, Incontinence    Examination-Activity Limitations Bed Mobility;Reach Overhead;Sit;Locomotion Level;Stand;Transfers;Stairs    Examination-Participation Restrictions Community Activity;Driving    Stability/Clinical Decision Making Stable/Uncomplicated    Rehab Potential Good    PT Frequency 2x / week    PT Duration 6 weeks    PT Treatment/Interventions ADLs/Self Care Home Management;Electrical Stimulation;Moist Heat;Cryotherapy;DME Instruction;Gait training;Stair training;Functional mobility training;Therapeutic activities;Balance training;Therapeutic exercise;Neuromuscular  re-education;Patient/family education;Orthotic Fit/Training;Passive range of motion;Vestibular    PT Next Visit Plan Continue use of Bioness with gait and SPC. NMR/strengthening. continue working with [facility] with quad tip. Continue high level balance, weight shift activites, RLE strengthening.    PT Home Exercise Plan Access Code: [email or web address]    Consulted and Agree with Plan of Care Patient;Family member/caregiver    Family Member Consulted Husband             Patient will benefit from skilled therapeutic intervention in order to improve the following deficits and impairments:  Abnormal gait, Decreased activity tolerance, Decreased endurance, Decreased knowledge of use of DME, Decreased range of motion, Decreased strength, Impaired UE functional use, Difficulty walking, Decreased balance, Decreased safety awareness, Decreased coordination  Visit Diagnosis: Other abnormalities of gait and mobility  Muscle weakness (generalized)  Unsteadiness on feet  Difficulty in walking, not elsewhere classified     Problem List Patient Active Problem List   Diagnosis Date Noted   Itching of both hands 10/20/2020   Sensation of cold in lower extremity 10/20/2020   Dysesthesia 09/21/2020   Hemiparesis due to recent stroke ([place]) 08/30/2020   Right sided weakness 08/29/2020   Pain in lateral portion of right ankle 07/20/2020   Plantar fasciitis of right foot 05/19/2020   Small vessel disease, cerebrovascular    Overactive bladder 10/24/2018   Amnesia memory loss 05/19/2017   Overweight (BMI 25.0-29.9) 05/09/2017   Lumbar herniated disc 02/01/2017   Medicare annual  wellness visit, subsequent 05/03/2016   Lactose intolerance in adult    History of kidney stones    Cervical pain (neck) 12/04/2015   Health maintenance examination 04/29/2014   Paresthesia 04/29/2014   Advanced care planning/counseling discussion 04/29/2014   PFD (pelvic floor dysfunction) 04/29/2014   Controlled diabetes mellitus  type 2 with complications ([place]) [id number]   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   Hx of diverticulitis of colon 11/07/2012   Allergic rhinitis 11/07/2012   Hyperlipidemia associated with type 2 diabetes mellitus ([place]) 11/07/2012   Migraine 11/07/2012   Hereditary and idiopathic peripheral neuropathy 11/07/2012   LGSIL Pap smear of vaginal cuff 11/07/2012   GERD (gastroesophageal reflux disease) 11/07/2012   Family history of premature coronary heart disease 11/07/2012    Jones Bales, PT, DPT 11/30/2020, 2:48 PM  [facility] [address] [place], [place], [address] Phone: [phone]   Fax:  [phone]  Name: ARLEDA LAHIFF MRN: [id number] Date of Birth: [birth year]

## 2020-12-02 ENCOUNTER — Other Ambulatory Visit: Payer: Self-pay

## 2020-12-02 ENCOUNTER — Ambulatory Visit: Payer: Medicare HMO

## 2020-12-02 DIAGNOSIS — R262 Difficulty in walking, not elsewhere classified: Secondary | ICD-10-CM | POA: Diagnosis not present

## 2020-12-02 DIAGNOSIS — M6281 Muscle weakness (generalized): Secondary | ICD-10-CM | POA: Diagnosis not present

## 2020-12-02 DIAGNOSIS — R2681 Unsteadiness on feet: Secondary | ICD-10-CM | POA: Diagnosis not present

## 2020-12-02 DIAGNOSIS — R2689 Other abnormalities of gait and mobility: Secondary | ICD-10-CM | POA: Diagnosis not present

## 2020-12-02 DIAGNOSIS — R278 Other lack of coordination: Secondary | ICD-10-CM | POA: Diagnosis not present

## 2020-12-02 DIAGNOSIS — I69851 Hemiplegia and hemiparesis following other cerebrovascular disease affecting right dominant side: Secondary | ICD-10-CM | POA: Diagnosis not present

## 2020-12-02 NOTE — Therapy (Signed)
[facility] [address] [place], [place], [address] Phone: [phone]   Fax:  [phone]  Physical Therapy Treatment  Patient Details  Name: Dana [name] MRN: [id number] Date of Birth: January 20, 1946 Referring Provider (PT): Referred by: Dana Austin, NP. PCP is Dana Bush, MD   Encounter Date: 12/02/2020   PT End of Session - 12/02/20 1319     Visit Number 14    Number of Visits 21    Date for PT Re-Evaluation 12/15/20    Authorization Type [employer] (10th Visit PN)    Authorization Time Period Auth: 09/16/2020 - 10/29/2020; Awaiting New Authorization    Progress Note Due on Visit 19   completed on 9th visit   PT Start Time 1317    PT Stop Time 1359    PT Time Calculation (min) 42 min    Equipment Utilized During Treatment Gait belt    Activity Tolerance Patient tolerated treatment well    Behavior During Therapy WFL for tasks assessed/performed             Past Medical History:  Diagnosis Date   Allergy    Anemia    Arthritis    Cataract    Controlled type 2 diabetes mellitus without complication, without long-term current use of insulin ([place])    Diverticulitis    History of chicken pox    Hyperlipidemia    Kidney stone    Lactose intolerance in adult    Migraine    Stroke [facility])    Urge incontinence 04/29/2014    Past Surgical History:  Procedure Laterality Date   ABDOMINAL HYSTERECTOMY  2006   cervix remains, menorrhagia   [place]   right milk duct removed   CARPAL TUNNEL RELEASE Right 02/2017   CHOLECYSTECTOMY  1971   COLONOSCOPY  11/2005   diverticulosis o/w WNL [facility])   DEXA  2015   spine -0.4, hip -1.0 WNL   LITHOTRIPSY     LUMBAR EPIDURAL INJECTION Left 10/2017   transforaminal L L3/4 (Dr Dana [name])   TUBAL LIGATION      There were no vitals filed for this visit.   Subjective Assessment -  12/02/20 1319     Subjective Patient reports doing well. Reports has not got a SPC at this time. Reports no new changes.    Patient is accompained by: Family member   [name])   Pertinent History Migraines, HLD, DM, GERD, Lactose Intolerance, Kidney Stones, Diverticulitis, Anemia, Incontinence    Limitations Sitting;Standing;Walking    How long can you walk comfortably? 10 minutes    Patient Stated Goals Walk without Assistance;    Currently in Pain? No/denies                [facility] Adult PT Treatment/Exercise - 12/02/20 0001       Transfers   Transfers Sit to Stand;Stand to Sit    Sit to Stand 6: Modified independent (Device/Increase time)    Stand to Sit 6: Modified independent (Device/Increase time)      Ambulation/Gait   Ambulation/Gait Yes    Ambulation/Gait Assistance 5: Supervision    Ambulation/Gait Assistance Details continue to use RW ambulating into/out of session, conitnued use of SPC for improved carryover throughout session    Ambulation Distance (Feet) --   clinic distance   Assistive device Straight cane    Gait Pattern Step-through pattern;Decreased arm  swing - right;Decreased arm swing - left;Decreased step length - right;Decreased step length - left;Decreased stance time - right;Decreased hip/knee flexion - right;Decreased dorsiflexion - right;Decreased weight shift to right;Poor foot clearance - right    Ambulation Surface Level;Indoor      Exercises   Exercises Knee/Hip      Knee/Hip Exercises: Aerobic   Other Aerobic Completed SciFit on Level 3.0 with BUE/BLE x 7 minutes with keeping steps per minute >/= 75 for improved endurance training/strengthening. Patient tolerating increase in resistance well.      Knee/Hip Exercises: Standing   Hip Abduction Stengthening;Both;1 set;10 reps;Knee straight;Limitations    Abduction Limitations cues to maintain knee extension    Hip Extension Stengthening;Both;1 set;10 reps;Knee straight;Limitations     Extension Limitations cues to maintain knee extension; as well as cues for posture.                 Balance Exercises - 12/02/20 0001       Balance Exercises: Standing   Stepping Strategy Anterior;Posterior;Foam/compliant surface;Limitations    Stepping Strategy Limitations standing on airex withotu UE support completed anterior/posterior stepping strategy x 10 reps bilat. increase challenge with stepping back onto foam after anterior step.    Rockerboard Anterior/posterior;Lateral;Head turns;EO;Intermittent UE support;Limitations    Rockerboard Limitations on rockerboard A/P; completed standing eyes open holding steady x 30 seconds, then addition of horizontal/vertical head turns x 10 reps. Then with light UE support completed A/P weight shift x 15 reps with cues for upright posture and use mirror for visual feedback. on rockerboard lateral:  completed standing eyes open holding steady x 30 seconds, then addition of horizontal/vertical head turns x 10 reps. Then with light UE support completed R/L weight shift x 15 reps. Increased challenge noted with board lateral with addition of head turns. CGA throughout and intermittent UE support.                 PT Short Term Goals - 11/27/20 1155       PT SHORT TERM GOAL #1   Title Patient will be able to ambulate x 250 ft with SPC vs No AD with superivison for improved household/community mobility    Baseline continue use of RW    Time 3    Period Weeks    Status Achieved    Target Date 11/19/20               PT Long Term Goals - 10/29/20 1246       PT LONG TERM GOAL #1   Title Patient will be independent with final HEP for balance/strength and walking program (All LTGs Due: 12/15/20)    Baseline no HEP established; reports independence, continue to progress HEP at this time, not completing daily    Time 6    Period Weeks    Status On-going    Target Date 12/15/20      PT LONG TERM GOAL #2   Title Patient will  improve gait speed to >/= 2.5 ft/sec with LRAD    Baseline 1.19 ft/sec, 2.42 ft/sec with RW    Time 6    Period Weeks    Status Revised      PT LONG TERM GOAL #3   Title Patient will improve Berg Balance to >/= 53/56 to indicate improved balance and reduced fall risk    Baseline 34/56, 48/56    Time 6    Period Weeks    Status Revised      PT LONG TERM  GOAL #4   Title Patient will improve TUG to </= 12 secs w/ LRAD to indicate reduced fall risk    Baseline 23.56 secs; 14.13 secs w/ RW    Time 6    Period Weeks    Status Revised      PT LONG TERM GOAL #5   Title Patient will be able to ambulate x 300 ft outdoors on unlevel surfaces w/ LRAD and supervision for improved community mobility    Baseline TBA    Time 6    Period Weeks    Status Revised      PT LONG TERM GOAL #6   Title Patient will demo ability to ascend/descend 12 stairs with single rail, alternating pattern, and supervision for improved ability to ambulate to second floor of home    Baseline not currently completing; continue to use bilat rails, alternating ascend, step to descend supervision    Time 6    Period Weeks    Status Revised      PT LONG TERM GOAL #7   Title Patient will improve FOTO to >/= 65%    Baseline 49%; continue with PT, unable to capture due to time constraints    Time 6    Period Weeks    Status On-going                   Plan - 12/02/20 1448     Clinical Impression Statement Today's skilled session focused on continued functional BLE strengthening, especially targeting hip stability/strength. Also conitnued balance exercises focused on complinat/unsteady surfaces and stepping strategy with reduced UE support. Patient tolerating well. Will continue to progress toward all LTGs.    Personal Factors and Comorbidities Comorbidity 3+    Comorbidities Migraines, HLD, DM, GERD, Lactose Intolerance, Kidney Stones, Diverticulitis, Anemia, Incontinence    Examination-Activity Limitations  Bed Mobility;Reach Overhead;Sit;Locomotion Level;Stand;Transfers;Stairs    Examination-Participation Restrictions Community Activity;Driving    Stability/Clinical Decision Making Stable/Uncomplicated    Rehab Potential Good    PT Frequency 2x / week    PT Duration 6 weeks    PT Treatment/Interventions ADLs/Self Care Home Management;Electrical Stimulation;Moist Heat;Cryotherapy;DME Instruction;Gait training;Stair training;Functional mobility training;Therapeutic activities;Balance training;Therapeutic exercise;Neuromuscular re-education;Patient/family education;Orthotic Fit/Training;Passive range of motion;Vestibular    PT Next Visit Plan Continue use of Bioness with gait and SPC. NMR/strengthening. continue working with [facility] with quad tip. Continue high level balance, weight shift activites, RLE strengthening.    PT Home Exercise Plan Access Code: [email or web address]    Consulted and Agree with Plan of Care Patient;Family member/caregiver    Family Member Consulted Husband             Patient will benefit from skilled therapeutic intervention in order to improve the following deficits and impairments:  Abnormal gait, Decreased activity tolerance, Decreased endurance, Decreased knowledge of use of DME, Decreased range of motion, Decreased strength, Impaired UE functional use, Difficulty walking, Decreased balance, Decreased safety awareness, Decreased coordination  Visit Diagnosis: Other abnormalities of gait and mobility  Muscle weakness (generalized)  Unsteadiness on feet  Difficulty in walking, not elsewhere classified     Problem List Patient Active Problem List   Diagnosis Date Noted   Itching of both hands 10/20/2020   Sensation of cold in lower extremity 10/20/2020   Dysesthesia 09/21/2020   Hemiparesis due to recent stroke ([place]) 08/30/2020   Right sided weakness 08/29/2020   Pain in lateral portion of right ankle 07/20/2020   Plantar fasciitis of right foot 05/19/2020   Small  vessel  disease, cerebrovascular    Overactive bladder 10/24/2018   Amnesia memory loss 05/19/2017   Overweight (BMI 25.0-29.9) 05/09/2017   Lumbar herniated disc 02/01/2017   Medicare annual wellness visit, subsequent 05/03/2016   Lactose intolerance in adult    History of kidney stones    Cervical pain (neck) 12/04/2015   Health maintenance examination 04/29/2014   Paresthesia 04/29/2014   Advanced care planning/counseling discussion 04/29/2014   PFD (pelvic floor dysfunction) 04/29/2014   Controlled diabetes mellitus type 2 with complications ([place]) [id number]   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   Hx of diverticulitis of colon 11/07/2012   Allergic rhinitis 11/07/2012   Hyperlipidemia associated with type 2 diabetes mellitus ([place]) 11/07/2012   Migraine 11/07/2012   Hereditary and idiopathic peripheral neuropathy 11/07/2012   LGSIL Pap smear of vaginal cuff 11/07/2012   GERD (gastroesophageal reflux disease) 11/07/2012   Family history of premature coronary heart disease 11/07/2012    Jones Bales, PT, DPT 12/02/2020, 2:49 PM  [facility] [address] [place], [place], [address] Phone: [phone]   Fax:  [phone]  Name: Dana [name] MRN: [id number] Date of Birth: 1945-08-27

## 2020-12-07 ENCOUNTER — Ambulatory Visit: Payer: Medicare HMO | Attending: Family Medicine

## 2020-12-07 ENCOUNTER — Other Ambulatory Visit: Payer: Self-pay

## 2020-12-07 DIAGNOSIS — M6281 Muscle weakness (generalized): Secondary | ICD-10-CM | POA: Insufficient documentation

## 2020-12-07 DIAGNOSIS — R2689 Other abnormalities of gait and mobility: Secondary | ICD-10-CM | POA: Diagnosis not present

## 2020-12-07 DIAGNOSIS — R262 Difficulty in walking, not elsewhere classified: Secondary | ICD-10-CM

## 2020-12-07 DIAGNOSIS — Z01419 Encounter for gynecological examination (general) (routine) without abnormal findings: Secondary | ICD-10-CM | POA: Diagnosis not present

## 2020-12-07 DIAGNOSIS — R2681 Unsteadiness on feet: Secondary | ICD-10-CM | POA: Diagnosis not present

## 2020-12-07 DIAGNOSIS — Z1272 Encounter for screening for malignant neoplasm of vagina: Secondary | ICD-10-CM | POA: Diagnosis not present

## 2020-12-07 DIAGNOSIS — R87620 Atypical squamous cells of undetermined significance on cytologic smear of vagina (ASC-US): Secondary | ICD-10-CM | POA: Diagnosis not present

## 2020-12-07 NOTE — Therapy (Signed)
[facility] [address] [place], [place], [address] Phone: [phone]   Fax:  [phone]  Physical Therapy Treatment  Patient Details  Name: Dana [name] MRN: [id number] Date of Birth: 04-16-1946 Referring Provider (PT): Referred by: [name], NP. PCP is [name], MD   Encounter Date: 12/07/2020   PT End of Session - 12/07/20 1234     Visit Number 15    Number of Visits 21    Date for PT Re-Evaluation 12/15/20    Authorization Type [employer] (10th Visit PN)    Authorization Time Period Auth: 09/16/2020 - 10/29/2020; Awaiting New Authorization    Progress Note Due on Visit 19   completed on 9th visit   PT Start Time 1241   paitent arriving late and needing to use bathroom prior to session   PT Stop Time 1315    PT Time Calculation (min) 34 min    Equipment Utilized During Treatment Gait belt    Activity Tolerance Patient tolerated treatment well    Behavior During Therapy WFL for tasks assessed/performed             Past Medical History:  Diagnosis Date   Allergy    Anemia    Arthritis    Cataract    Controlled type 2 diabetes mellitus without complication, without long-term current use of insulin ([place])    Diverticulitis    History of chicken pox    Hyperlipidemia    Kidney stone    Lactose intolerance in adult    Migraine    Stroke [facility])    Urge incontinence 04/29/2014    Past Surgical History:  Procedure Laterality Date   ABDOMINAL HYSTERECTOMY  2006   cervix remains, menorrhagia   [place]   right milk duct removed   CARPAL TUNNEL RELEASE Right 02/2017   CHOLECYSTECTOMY  1971   COLONOSCOPY  11/2005   diverticulosis o/w WNL [facility])   DEXA  2015   spine -0.4, hip -1.0 WNL   LITHOTRIPSY     LUMBAR EPIDURAL INJECTION Left 10/2017   transforaminal L L3/4 (Dr Maryjean Ka)   TUBAL LIGATION      There  were no vitals filed for this visit.   Subjective Assessment - 12/07/20 1241     Subjective Patient brought in new [place] with quad tip. No other new changes/complaints. Has had a busy morning.    Patient is accompained by: Family member   Husband [name])   Pertinent History Migraines, HLD, DM, GERD, Lactose Intolerance, Kidney Stones, Diverticulitis, Anemia, Incontinence    Limitations Sitting;Standing;Walking    How long can you walk comfortably? 10 minutes    Patient Stated Goals Walk without Assistance;    Currently in Pain? No/denies                               [facility] Adult PT Treatment/Exercise - 12/07/20 0001       Transfers   Transfers Sit to Stand;Stand to Sit    Sit to Stand 6: Modified independent (Device/Increase time)    Stand to Sit 6: Modified independent (Device/Increase time)    Comments completed sit <> stands with RLE posterior promote weight shift and strengthening of RLE, completed x 10 reps.      Ambulation/Gait   Ambulation/Gait Yes    Ambulation/Gait Assistance 5:  Supervision    Ambulation/Gait Assistance Details completed ambulation with SPC with quad tip x 230 ft indoors. Then progressed to outdoor paved surfaces x 600 ft with SPC with quad tip. Increased challenge noted on paved surface especially with clearance of RLE with increasing distance and fatigue. Standing rest break required. Patient require intermittent cues for relaxation of RUE with gait to promote imporved arm swing.    Ambulation Distance (Feet) 230 Feet   x 1; 600 ft (outdoors)   Assistive device Straight cane    Gait Pattern Step-through pattern;Decreased arm swing - right;Decreased arm swing - left;Decreased step length - right;Decreased step length - left;Decreased stance time - right;Decreased hip/knee flexion - right;Decreased dorsiflexion - right;Decreased weight shift to right;Poor foot clearance - right    Ambulation Surface Level;Indoor;Unlevel;Outdoor;Paved    Gait  Comments PT adjusted cane for patient's height.      Exercises   Exercises Knee/Hip      Knee/Hip Exercises: Standing   Other Standing Knee Exercises completed TKE standing with against red theraband on RLE x 10 reps. Cues for technique..    Other Standing Knee Exercises Completed standing mini squats 2 x 10 reps, with light UE support from RW. Cues for posture.      Knee/Hip Exercises: Seated   Hamstring Curl AROM;Strengthening;Right;2 sets;10 reps;Limitations    Hamstring Limitations completed with red tband; 2 x10 reps. cues for technique required.                    PT Education - 12/07/20 1314     Education Details begin to use SPC indoors.    Person(s) Educated Patient    Methods Explanation    Comprehension Verbalized understanding              PT Short Term Goals - 11/27/20 1155       PT SHORT TERM GOAL #1   Title Patient will be able to ambulate x 250 ft with SPC vs No AD with superivison for improved household/community mobility    Baseline continue use of RW    Time 3    Period Weeks    Status Achieved    Target Date 11/19/20               PT Long Term Goals - 10/29/20 1246       PT LONG TERM GOAL #1   Title Patient will be independent with final HEP for balance/strength and walking program (All LTGs Due: 12/15/20)    Baseline no HEP established; reports independence, continue to progress HEP at this time, not completing daily    Time 6    Period Weeks    Status On-going    Target Date 12/15/20      PT LONG TERM GOAL #2   Title Patient will improve gait speed to >/= 2.5 ft/sec with LRAD    Baseline 1.19 ft/sec, 2.42 ft/sec with RW    Time 6    Period Weeks    Status Revised      PT LONG TERM GOAL #3   Title Patient will improve Berg Balance to >/= 53/56 to indicate improved balance and reduced fall risk    Baseline 34/56, 48/56    Time 6    Period Weeks    Status Revised      PT LONG TERM GOAL #4   Title Patient will improve  TUG to </= 12 secs w/ LRAD to indicate reduced fall risk    Baseline  23.56 secs; 14.13 secs w/ RW    Time 6    Period Weeks    Status Revised      PT LONG TERM GOAL #5   Title Patient will be able to ambulate x 300 ft outdoors on unlevel surfaces w/ LRAD and supervision for improved community mobility    Baseline TBA    Time 6    Period Weeks    Status Revised      PT LONG TERM GOAL #6   Title Patient will demo ability to ascend/descend 12 stairs with single rail, alternating pattern, and supervision for improved ability to ambulate to second floor of home    Baseline not currently completing; continue to use bilat rails, alternating ascend, step to descend supervision    Time 6    Period Weeks    Status Revised      PT LONG TERM GOAL #7   Title Patient will improve FOTO to >/= 65%    Baseline 49%; continue with PT, unable to capture due to time constraints    Time 6    Period Weeks    Status On-going                   Plan - 12/07/20 1400     Clinical Impression Statement Continued gait training indoors and outdoors with [facility] with quad tip, increased fatigue and balance challenge outdoors. PT providing clearance to begin ambulating with SPC on indoor surfaces at this time, but continue to use RW for outdoor surface. Rest of session focused on RLE strengthening with patient tolerating well. Will continue to progress toward all LTGs.    Personal Factors and Comorbidities Comorbidity 3+    Comorbidities Migraines, HLD, DM, GERD, Lactose Intolerance, Kidney Stones, Diverticulitis, Anemia, Incontinence    Examination-Activity Limitations Bed Mobility;Reach Overhead;Sit;Locomotion Level;Stand;Transfers;Stairs    Examination-Participation Restrictions Community Activity;Driving    Stability/Clinical Decision Making Stable/Uncomplicated    Rehab Potential Good    PT Frequency 2x / week    PT Duration 6 weeks    PT Treatment/Interventions ADLs/Self Care Home  Management;Electrical Stimulation;Moist Heat;Cryotherapy;DME Instruction;Gait training;Stair training;Functional mobility training;Therapeutic activities;Balance training;Therapeutic exercise;Neuromuscular re-education;Patient/family education;Orthotic Fit/Training;Passive range of motion;Vestibular    PT Next Visit Plan Continue use of Bioness with gait and SPC. NMR/strengthening. continue working with [facility] with quad tip. Continue high level balance, weight shift activites, RLE strengthening.    PT Home Exercise Plan Access Code: [email or web address]    Consulted and Agree with Plan of Care Patient;Family member/caregiver    Family Member Consulted Husband             Patient will benefit from skilled therapeutic intervention in order to improve the following deficits and impairments:  Abnormal gait, Decreased activity tolerance, Decreased endurance, Decreased knowledge of use of DME, Decreased range of motion, Decreased strength, Impaired UE functional use, Difficulty walking, Decreased balance, Decreased safety awareness, Decreased coordination  Visit Diagnosis: Other abnormalities of gait and mobility  Muscle weakness (generalized)  Unsteadiness on feet  Difficulty in walking, not elsewhere classified     Problem List Patient Active Problem List   Diagnosis Date Noted   Itching of both hands 10/20/2020   Sensation of cold in lower extremity 10/20/2020   Dysesthesia 09/21/2020   Hemiparesis due to recent stroke ([place]) 08/30/2020   Right sided weakness 08/29/2020   Pain in lateral portion of right ankle 07/20/2020   Plantar fasciitis of right foot 05/19/2020   Small vessel disease, cerebrovascular    Overactive  bladder 10/24/2018   Amnesia memory loss 05/19/2017   Overweight (BMI 25.0-29.9) 05/09/2017   Lumbar herniated disc 02/01/2017   Medicare annual wellness visit, subsequent 05/03/2016   Lactose intolerance in adult    History of kidney stones    Cervical pain (neck) 12/04/2015    Health maintenance examination 04/29/2014   Paresthesia 04/29/2014   Advanced care planning/counseling discussion 04/29/2014   PFD (pelvic floor dysfunction) 04/29/2014   Controlled diabetes mellitus type 2 with complications ([place]) [id number]   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   Hx of diverticulitis of colon 11/07/2012   Allergic rhinitis 11/07/2012   Hyperlipidemia associated with type 2 diabetes mellitus ([place]) 11/07/2012   Migraine 11/07/2012   Hereditary and idiopathic peripheral neuropathy 11/07/2012   LGSIL Pap smear of vaginal cuff 11/07/2012   GERD (gastroesophageal reflux disease) 11/07/2012   Family history of premature coronary heart disease 11/07/2012    Jones Bales, PT, DPT 12/07/2020, 2:02 PM  [facility] [address] [place], [place], [address] Phone: [phone]   Fax:  [phone]  Name: Dana [name] MRN: [id number] Date of Birth: [birth year]

## 2020-12-08 ENCOUNTER — Telehealth: Payer: Self-pay

## 2020-12-08 NOTE — Telephone Encounter (Signed)
Pt said for 2 wks having constipation which pt has talked to Dr Darnell Level about; pt has been off the Zetia per Dr Darnell Level instruction for 2 wks and pt still has constipation. Pt said she takes OTC colace one pill at nighttime. The next morning pt has a normal BM that is formed but not really a constipated stool (no Diarrhea) and after the BM pt feels nauseated. Pt stopped taking the ondansetron because that med can cause constipation. Pt does not have abd pain, no fever and pt does not vomit but does feel nausea after each BM. Pt wants to know if there is something else pt can take for constipation so hopefully will not be nauseated after each BM. [facility]; pt request cb after reviewed by Dr Darnell Level.

## 2020-12-08 NOTE — Telephone Encounter (Signed)
Recommend restart zetia. Recommend trial of sennakot S or other senna OTC once daily.  Update [place] with effect - will recommend OV if no better as we have not evaluated in office for these symptoms.  Agree with stopping zofran as it can cause constipation.

## 2020-12-09 ENCOUNTER — Ambulatory Visit: Payer: Medicare HMO

## 2020-12-09 ENCOUNTER — Other Ambulatory Visit: Payer: Self-pay

## 2020-12-09 DIAGNOSIS — R2689 Other abnormalities of gait and mobility: Secondary | ICD-10-CM

## 2020-12-09 DIAGNOSIS — M6281 Muscle weakness (generalized): Secondary | ICD-10-CM

## 2020-12-09 DIAGNOSIS — R262 Difficulty in walking, not elsewhere classified: Secondary | ICD-10-CM

## 2020-12-09 DIAGNOSIS — R2681 Unsteadiness on feet: Secondary | ICD-10-CM

## 2020-12-09 MED ORDER — SENNOSIDES 8.6 MG PO TABS: 1.0000 | ORAL_TABLET | Freq: Every day | ORAL | Status: DC | PRN

## 2020-12-09 NOTE — Telephone Encounter (Signed)
Spoke with pt relaying Dr. Synthia Innocent message.  Pt verbalizes understanding.  However, she asks how long she should take Senokot-S and should she take it in AM or before bed.  Plz advise.  Pt gives permission to lvm with a response.

## 2020-12-09 NOTE — Therapy (Signed)
[facility] [address] [place], [place], [address] Phone: [phone]   Fax:  [phone]  Physical Therapy Treatment  Patient Details  Name: Dana [name] MRN: [id number] Date of Birth: [birth year] Referring Provider (PT): Referred by: [name], NP. PCP is [name], MD   Encounter Date: 12/09/2020   PT End of Session - 12/09/20 1319     Visit Number 16    Number of Visits 21    Date for PT Re-Evaluation 12/15/20    Authorization Type [employer] (10th Visit PN)    Authorization Time Period Auth: 09/16/2020 - 10/29/2020; 10/30/2020 - 12/15/2020    Progress Note Due on Visit 19   completed on 9th visit   PT Start Time 1317    PT Stop Time 1359    PT Time Calculation (min) 42 min    Equipment Utilized During Treatment Gait belt    Activity Tolerance Patient tolerated treatment well    Behavior During Therapy WFL for tasks assessed/performed             Past Medical History:  Diagnosis Date   Allergy    Anemia    Arthritis    Cataract    Controlled type 2 diabetes mellitus without complication, without long-term current use of insulin ([place])    Diverticulitis    History of chicken pox    Hyperlipidemia    Kidney stone    Lactose intolerance in adult    Migraine    Stroke [facility])    Urge incontinence 04/29/2014    Past Surgical History:  Procedure Laterality Date   ABDOMINAL HYSTERECTOMY  2006   cervix remains, menorrhagia   [place]   right milk duct removed   CARPAL TUNNEL RELEASE Right 02/2017   CHOLECYSTECTOMY  1971   COLONOSCOPY  11/2005   diverticulosis o/w WNL [facility])   DEXA  2015   spine -0.4, hip -1.0 WNL   LITHOTRIPSY     LUMBAR EPIDURAL INJECTION Left 10/2017   transforaminal L L3/4 (Dr Maryjean Ka)   TUBAL LIGATION      There were no vitals filed for this visit.   Subjective Assessment - 12/09/20  1318     Subjective Patient reports just not feeling well due to stomach issues, waiting for MD call back regarding this. Reports her energy level has been lower. Reports she is using SPC in the house and felt comfortable with it.    Patient is accompained by: Family member   Husband [name])   Pertinent History Migraines, HLD, DM, GERD, Lactose Intolerance, Kidney Stones, Diverticulitis, Anemia, Incontinence    Limitations Sitting;Standing;Walking    How long can you walk comfortably? 10 minutes    Patient Stated Goals Walk without Assistance;                [facility] Adult PT Treatment/Exercise - 12/09/20 0001       Transfers   Transfers Sit to Stand;Stand to Sit    Sit to Stand 6: Modified independent (Device/Increase time)    Stand to Sit 6: Modified independent (Device/Increase time)      Ambulation/Gait   Ambulation/Gait Yes    Ambulation/Gait Assistance 5: Supervision    Ambulation/Gait Assistance Details continue to walk into sesion with RW, but use SPC with quad tip throughout session. Continued gait training outdoors with [facility] w/ quad tip, ambulating x 100 ft outdoors.  CGA required    Ambulation Distance (Feet) 100 Feet    Assistive device Straight cane    Gait Pattern Step-through pattern;Decreased arm swing - right;Decreased arm swing - left;Decreased step length - right;Decreased step length - left;Decreased stance time - right;Decreased hip/knee flexion - right;Decreased dorsiflexion - right;Decreased weight shift to right;Poor foot clearance - right    Ambulation Surface Level;Indoor;Unlevel;Outdoor    Gait Comments PT provided new tennis balls for RW for ease of propulsion      Exercises   Exercises Knee/Hip      Knee/Hip Exercises: Aerobic   Other Aerobic Completed SciFit on Level 3.0 with BUE/BLE x 8 minutes with keeping steps per minute >/= 75 for improved endurance training/strengthening. Patient tolerate increase in time well.      Knee/Hip Exercises: Standing    Heel Raises Both;1 set;10 reps;Limitations    Heel Raises Limitations completed with BUE support from // bars x 10 reps, followed by toe raises x 10 reps. Cues for proper completion and posture especially with toe raises.    Lateral Step Up Right;1 set;10 reps;Hand Hold: 2;Step Height: 6"    Lateral Step Up Limitations cues for posture    Forward Step Up Right;1 set;10 reps;Hand Hold: 2;Step Height: 6"              Balance Exercises - 12/09/20 0001       Balance Exercises: Standing   Stepping Strategy Anterior;Posterior    Stepping Strategy Limitations standing on firm surface, completed posterior stepping with RLE to promote improved hip extension and weight shift with completion x 15 reps, then completed anterior stepping with LLE ot promote weight shift/stance on RLE x 15 reps.                 PT Short Term Goals - 11/27/20 1155       PT SHORT TERM GOAL #1   Title Patient will be able to ambulate x 250 ft with SPC vs No AD with superivison for improved household/community mobility    Baseline continue use of RW    Time 3    Period Weeks    Status Achieved    Target Date 11/19/20               PT Long Term Goals - 10/29/20 1246       PT LONG TERM GOAL #1   Title Patient will be independent with final HEP for balance/strength and walking program (All LTGs Due: 12/15/20)    Baseline no HEP established; reports independence, continue to progress HEP at this time, not completing daily    Time 6    Period Weeks    Status On-going    Target Date 12/15/20      PT LONG TERM GOAL #2   Title Patient will improve gait speed to >/= 2.5 ft/sec with LRAD    Baseline 1.19 ft/sec, 2.42 ft/sec with RW    Time 6    Period Weeks    Status Revised      PT LONG TERM GOAL #3   Title Patient will improve Berg Balance to >/= 53/56 to indicate improved balance and reduced fall risk    Baseline 34/56, 48/56    Time 6    Period Weeks    Status Revised      PT LONG TERM  GOAL #4   Title Patient will improve TUG to </= 12 secs w/ LRAD to indicate reduced fall risk    Baseline 23.56  secs; 14.13 secs w/ RW    Time 6    Period Weeks    Status Revised      PT LONG TERM GOAL #5   Title Patient will be able to ambulate x 300 ft outdoors on unlevel surfaces w/ LRAD and supervision for improved community mobility    Baseline TBA    Time 6    Period Weeks    Status Revised      PT LONG TERM GOAL #6   Title Patient will demo ability to ascend/descend 12 stairs with single rail, alternating pattern, and supervision for improved ability to ambulate to second floor of home    Baseline not currently completing; continue to use bilat rails, alternating ascend, step to descend supervision    Time 6    Period Weeks    Status Revised      PT LONG TERM GOAL #7   Title Patient will improve FOTO to >/= 65%    Baseline 49%; continue with PT, unable to capture due to time constraints    Time 6    Period Weeks    Status On-going                   Plan - 12/09/20 1334     Clinical Impression Statement Continued gait training outdoors with [facility] to further work toward being more comfortable with this AD for use with community mobility, CGA still required intemrittently outdoors. Contineud BLE strengthening with intermittent rest breaks. Session limited due to bathroom break due to continued GI issues, patient awaiting answers from MD at thist ime. Will continue to progress toward all LTGs.    Personal Factors and Comorbidities Comorbidity 3+    Comorbidities Migraines, HLD, DM, GERD, Lactose Intolerance, Kidney Stones, Diverticulitis, Anemia, Incontinence    Examination-Activity Limitations Bed Mobility;Reach Overhead;Sit;Locomotion Level;Stand;Transfers;Stairs    Examination-Participation Restrictions Community Activity;Driving    Stability/Clinical Decision Making Stable/Uncomplicated    Rehab Potential Good    PT Frequency 2x / week    PT Duration 6 weeks     PT Treatment/Interventions ADLs/Self Care Home Management;Electrical Stimulation;Moist Heat;Cryotherapy;DME Instruction;Gait training;Stair training;Functional mobility training;Therapeutic activities;Balance training;Therapeutic exercise;Neuromuscular re-education;Patient/family education;Orthotic Fit/Training;Passive range of motion;Vestibular    PT Next Visit Plan Will need goal check + Re-Cert next week. Continue use of Bioness with gait and SPC. NMR/strengthening. continue working with [facility] with quad tip. Continue high level balance, weight shift activites, RLE strengthening.    PT Home Exercise Plan Access Code: [email or web address]    Consulted and Agree with Plan of Care Patient;Family member/caregiver    Family Member Consulted Husband             Patient will benefit from skilled therapeutic intervention in order to improve the following deficits and impairments:  Abnormal gait, Decreased activity tolerance, Decreased endurance, Decreased knowledge of use of DME, Decreased range of motion, Decreased strength, Impaired UE functional use, Difficulty walking, Decreased balance, Decreased safety awareness, Decreased coordination  Visit Diagnosis: Other abnormalities of gait and mobility  Muscle weakness (generalized)  Unsteadiness on feet  Difficulty in walking, not elsewhere classified     Problem List Patient Active Problem List   Diagnosis Date Noted   Itching of both hands 10/20/2020   Sensation of cold in lower extremity 10/20/2020   Dysesthesia 09/21/2020   Hemiparesis due to recent stroke ([place]) 08/30/2020   Right sided weakness 08/29/2020   Pain in lateral portion of right ankle 07/20/2020   Plantar fasciitis of right foot 05/19/2020  Small vessel disease, cerebrovascular    Overactive bladder 10/24/2018   Amnesia memory loss 05/19/2017   Overweight (BMI 25.0-29.9) 05/09/2017   Lumbar herniated disc 02/01/2017   Medicare annual wellness visit, subsequent 05/03/2016    Lactose intolerance in adult    History of kidney stones    Cervical pain (neck) 12/04/2015   Health maintenance examination 04/29/2014   Paresthesia 04/29/2014   Advanced care planning/counseling discussion 04/29/2014   PFD (pelvic floor dysfunction) 04/29/2014   Controlled diabetes mellitus type 2 with complications ([place]) [id number]   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   Hx of diverticulitis of colon 11/07/2012   Allergic rhinitis 11/07/2012   Hyperlipidemia associated with type 2 diabetes mellitus ([place]) 11/07/2012   Migraine 11/07/2012   Hereditary and idiopathic peripheral neuropathy 11/07/2012   LGSIL Pap smear of vaginal cuff 11/07/2012   GERD (gastroesophageal reflux disease) 11/07/2012   Family history of premature coronary heart disease 11/07/2012    Jones Bales, PT, DPT 12/09/2020, 2:18 PM  [facility] [address] [place], [place], [address] Phone: [phone]   Fax:  [phone]  Name: Dana [name] MRN: [id number] Date of Birth: [birth year]

## 2020-12-09 NOTE — Telephone Encounter (Signed)
Recommend one daily in the morning, may take for 1 week as long as tolerating well to see effect. Goal is 1 soft stool/day.

## 2020-12-09 NOTE — Addendum Note (Signed)
Addended by: [name] on: 12/09/2020 05:27 PM   Modules accepted: Orders

## 2020-12-10 NOTE — Telephone Encounter (Signed)
Spoke with pt relaying Dr. Synthia Innocent message.  Pt verbalizes understanding.   Also, pt states it was discussed at a previous OV about possibly doubling atorvastatin to 2 tabs daily and Zetia.  Pt is asking would it be a good idea to take 60 mg of atorvastatin and stop Zetia.

## 2020-12-11 NOTE — Telephone Encounter (Signed)
Spoke with pt relaying Dr. G's message. Pt verbalizes understanding.  

## 2020-12-11 NOTE — Telephone Encounter (Signed)
I'd be hesitant to increase atorvastatin as that is more likely to cause muscle aches than the zetia. Recommend continue atorvastatin '40mg'$  at this time.

## 2020-12-14 ENCOUNTER — Other Ambulatory Visit: Payer: Self-pay

## 2020-12-14 ENCOUNTER — Ambulatory Visit: Payer: Medicare HMO

## 2020-12-14 DIAGNOSIS — R2681 Unsteadiness on feet: Secondary | ICD-10-CM

## 2020-12-14 DIAGNOSIS — R2689 Other abnormalities of gait and mobility: Secondary | ICD-10-CM

## 2020-12-14 DIAGNOSIS — R262 Difficulty in walking, not elsewhere classified: Secondary | ICD-10-CM | POA: Diagnosis not present

## 2020-12-14 DIAGNOSIS — M6281 Muscle weakness (generalized): Secondary | ICD-10-CM | POA: Diagnosis not present

## 2020-12-14 NOTE — Therapy (Signed)
[facility] [address] [place], [place], [address] Phone: [phone]   Fax:  [phone]  Physical Therapy Treatment/Discharge Summary  Patient Details  Name: Dana [name] MRN: [id number] Date of Birth: June 16, 1945 Referring Provider (PT): Referred by: [name], NP. PCP is [name], MD  PHYSICAL THERAPY DISCHARGE SUMMARY  Visits from Start of Care: 17  Current functional level related to goals / functional outcomes: See Clinical Impression Statement   Remaining deficits: Mild Balance Impairment   Education / Equipment: HEP provided/Walking Program    Patient agrees to discharge. Patient goals were met. Patient is being discharged due to meeting the stated rehab goals.   Encounter Date: 12/14/2020   PT End of Session - 12/14/20 1232     Visit Number 17    Number of Visits 21    Date for PT Re-Evaluation 12/15/20    Authorization Type [employer] (10th Visit PN)    Authorization Time Period Auth: 09/16/2020 - 10/29/2020; 10/30/2020 - 12/15/2020    Progress Note Due on Visit 19   completed on 9th visit   PT Start Time 1230    PT Stop Time 1313    PT Time Calculation (min) 43 min    Equipment Utilized During Treatment Gait belt    Activity Tolerance Patient tolerated treatment well    Behavior During Therapy WFL for tasks assessed/performed             Past Medical History:  Diagnosis Date   Allergy    Anemia    Arthritis    Cataract    Controlled type 2 diabetes mellitus without complication, without long-term current use of insulin ([place])    Diverticulitis    History of chicken pox    Hyperlipidemia    Kidney stone    Lactose intolerance in adult    Migraine    Stroke ([place])    Urge incontinence 04/29/2014    Past Surgical History:  Procedure Laterality Date   ABDOMINAL HYSTERECTOMY  2006   cervix remains, menorrhagia   [place]    [place]   right milk duct removed   CARPAL TUNNEL RELEASE Right 02/2017   CHOLECYSTECTOMY  1971   COLONOSCOPY  11/2005   diverticulosis o/w WNL [facility])   DEXA  2015   spine -0.4, hip -1.0 WNL   LITHOTRIPSY     LUMBAR EPIDURAL INJECTION Left 10/2017   transforaminal L L3/4 (Dr Maryjean Ka)   TUBAL LIGATION      There were no vitals filed for this visit.   Subjective Assessment - 12/14/20 1231     Subjective No new issues/complaints. Reports feels better today. No pain. No falls. Reports using SPC in house is going well. Reports MD gave her approval to stop taking Gabapentin.    Patient is accompained by: Family member   [name])   Pertinent History Migraines, HLD, DM, GERD, Lactose Intolerance, Kidney Stones, Diverticulitis, Anemia, Incontinence    Limitations Sitting;Standing;Walking    How long can you walk comfortably? 10 minutes    Patient Stated Goals Walk without Assistance;    Currently in Pain? No/denies                [facility] PT Assessment - 12/14/20 0001       Assessment   Medical Diagnosis CVA ([facility])    Referring Provider (PT) Referred by: [name], NP. PCP is  [name], MD      Observation/Other Assessments   Focus on Therapeutic Outcomes (FOTO)  60%                [facility] Adult PT Treatment/Exercise - 12/14/20 0001       Transfers   Transfers Sit to Stand;Stand to Sit    Sit to Stand 6: Modified independent (Device/Increase time)    Stand to Sit 6: Modified independent (Device/Increase time)      Ambulation/Gait   Ambulation/Gait Yes    Ambulation/Gait Assistance 5: Supervision    Ambulation/Gait Assistance Details completed ambulation with SPC x 400 ft indoors due to weather constraints (raining outdoors), supervision throughout. Ambulating across mat to simulate outdoors with supervision.    Ambulation Distance (Feet) 400 Feet    Assistive device Straight cane    Gait Pattern  Step-through pattern;Decreased arm swing - right;Decreased arm swing - left;Decreased step length - right;Decreased step length - left;Decreased stance time - right;Decreased hip/knee flexion - right;Decreased dorsiflexion - right;Decreased weight shift to right;Poor foot clearance - right    Ambulation Surface Indoor;Level    Gait velocity 12.98 secs = 2.52 ft/sec    Stairs Yes    Stairs Assistance 5: Supervision    Stairs Assistance Details (indicate cue type and reason) supervision; alteranting pattern with single rail on L side    Stair Management Technique One rail Left;Alternating pattern;Forwards    Number of Stairs 12    Height of Stairs 6      Standardized Balance Assessment   Standardized Balance Assessment Timed Up and Go Test;Berg Balance Test      Berg Balance Test   Sit to Stand Able to stand without using hands and stabilize independently    Standing Unsupported Able to stand safely 2 minutes    Sitting with Back Unsupported but Feet Supported on Floor or Stool Able to sit safely and securely 2 minutes    Stand to Sit Sits safely with minimal use of hands    Transfers Able to transfer safely, minor use of hands    Standing Unsupported with Eyes Closed Able to stand 10 seconds safely    Standing Ubsupported with Feet Together Able to place feet together independently and stand 1 minute safely    From Standing, Reach Forward with Outstretched Arm Can reach confidently >25 cm (10")    From Standing Position, Pick up Object from Floor Able to pick up shoe safely and easily    From Standing Position, Turn to Look Behind Over each Shoulder Looks behind from both sides and weight shifts well    Turn 360 Degrees Able to turn 360 degrees safely in 4 seconds or less    Standing Unsupported, Alternately Place Feet on Step/Stool Able to stand independently and safely and complete 8 steps in 20 seconds    Standing Unsupported, One Foot in Front Able to plae foot ahead of the other  independently and hold 30 seconds    Standing on One Leg Able to lift leg independently and hold equal to or more than 3 seconds    Total Score 53      Timed Up and Go Test   TUG Normal TUG    Normal TUG (seconds) 11.25   with SPC           Access Code: [email or web address] URL: [email or web address] Date: 10/29/2020 Prepared by: Dana [name]   Exercises Supine Bridge - 1 x daily - 5 x weekly - 2 sets - 10  reps Sit to Stand with Arms Crossed - 1 x daily - 5 x weekly - 2 sets - 10 reps Romberg Stance with Eyes Closed - 1 x daily - 5 x weekly - 1 sets - 3 reps - 30 hold Side Stepping with Counter Support - 1 x daily - 5 x weekly - 1 sets - 4 reps Seated Hamstring Stretch - 1 x daily - 5 x weekly - 1 sets - 3 reps - 30 seconds hold Supine Single Knee to Chest Stretch - 1 x daily - 5 x weekly - 1 sets - 3 reps - 30 seconds hold Standing Hip Abduction with Counter Support - 1 x daily - 7 x weekly - 3 sets - 10 reps Standing March with Counter Support - 1 x daily - 7 x weekly - 3 sets - 10 reps       PT Education - 12/14/20 1232     Education Details progress toward LTGs    Person(s) Educated Patient;Spouse    Methods Explanation    Comprehension Verbalized understanding              PT Short Term Goals - 11/27/20 1155       PT SHORT TERM GOAL #1   Title Patient will be able to ambulate x 250 ft with SPC vs No AD with superivison for improved household/community mobility    Baseline continue use of RW    Time 3    Period Weeks    Status Achieved    Target Date 11/19/20               PT Long Term Goals - 12/14/20 1234       PT LONG TERM GOAL #1   Title Patient will be independent with final HEP for balance/strength and walking program (All LTGs Due: 12/15/20)    Baseline no HEP established; reports independence, continue to progress HEP at this time, not completing daily; reports independence, completd 3-4x/week    Time 6    Period Weeks    Status  Partially Met      PT LONG TERM GOAL #2   Title Patient will improve gait speed to >/= 2.5 ft/sec with LRAD    Baseline 1.19 ft/sec, 2.42 ft/sec with RW; 2.52 ft/sec with SPC    Time 6    Period Weeks    Status Achieved      PT LONG TERM GOAL #3   Title Patient will improve Berg Balance to >/= 53/56 to indicate improved balance and reduced fall risk    Baseline 34/56, 48/56; 53/56    Time 6    Period Weeks    Status Achieved      PT LONG TERM GOAL #4   Title Patient will improve TUG to </= 12 secs w/ LRAD to indicate reduced fall risk    Baseline 23.56 secs; 14.13 secs w/ RW; 11.25 secs    Time 6    Period Weeks    Status Achieved      PT LONG TERM GOAL #5   Title Patient will be able to ambulate x 300 ft outdoors on unlevel surfaces w/ LRAD and supervision for improved community mobility    Baseline unable to ambulate outdoors due to weather; 400 ft indoors and over unlevel surfaces with supervision    Time 6    Period Weeks    Status Partially Met      PT LONG TERM GOAL #6   Title Patient  will demo ability to ascend/descend 12 stairs with single rail, alternating pattern, and supervision for improved ability to ambulate to second floor of home    Baseline not currently completing; continue to use bilat rails, alternating ascend, step to descend supervision; supervision alternating pattern with single rail on L    Time 6    Period Weeks    Status Achieved      PT LONG TERM GOAL #7   Title Patient will improve FOTO to >/= 65%    Baseline 49%; continue with PT, unable to capture due to time constraints; 60%    Time 6    Period Weeks    Status Partially Met                   Plan - 12/14/20 1246     Clinical Impression Statement Completed assesment of patient's progress toward LTG. Patient able to meet/partially meet all LTGs today. Patient demosntrating reduced fall risk with Berg balance score of 53/56, and TUG time of 11.25 secs with use of SPC. Patient  currently ambulating at 2.52 ft/sec with [facility] demonstrating community mobility. Paitent has demosntrated significant progress with PT services and demo readiness for d/c at this time. Reviewed HEP with patient and educating on compiance. Pt requesting ability to return to driving with PT educating that this clearance will have to come from PCP. Patient and husband agreeable to d/c at this time.    Personal Factors and Comorbidities Comorbidity 3+    Comorbidities Migraines, HLD, DM, GERD, Lactose Intolerance, Kidney Stones, Diverticulitis, Anemia, Incontinence    Examination-Activity Limitations Bed Mobility;Reach Overhead;Sit;Locomotion Level;Stand;Transfers;Stairs    Examination-Participation Restrictions Community Activity;Driving    Stability/Clinical Decision Making Stable/Uncomplicated    Rehab Potential Good    PT Frequency 2x / week    PT Duration 6 weeks    PT Treatment/Interventions ADLs/Self Care Home Management;Electrical Stimulation;Moist Heat;Cryotherapy;DME Instruction;Gait training;Stair training;Functional mobility training;Therapeutic activities;Balance training;Therapeutic exercise;Neuromuscular re-education;Patient/family education;Orthotic Fit/Training;Passive range of motion;Vestibular    PT Next Visit Plan --    PT Home Exercise Plan Access Code: [email or web address]    Consulted and Agree with Plan of Care Patient;Family member/caregiver    Family Member Consulted Husband             Patient will benefit from skilled therapeutic intervention in order to improve the following deficits and impairments:  Abnormal gait, Decreased activity tolerance, Decreased endurance, Decreased knowledge of use of DME, Decreased range of motion, Decreased strength, Impaired UE functional use, Difficulty walking, Decreased balance, Decreased safety awareness, Decreased coordination  Visit Diagnosis: Other abnormalities of gait and mobility  Muscle weakness (generalized)  Unsteadiness on  feet  Difficulty in walking, not elsewhere classified     Problem List Patient Active Problem List   Diagnosis Date Noted   Itching of both hands 10/20/2020   Sensation of cold in lower extremity 10/20/2020   Dysesthesia 09/21/2020   Hemiparesis due to recent stroke ([place]) 08/30/2020   Right sided weakness 08/29/2020   Pain in lateral portion of right ankle 07/20/2020   Plantar fasciitis of right foot 05/19/2020   Small vessel disease, cerebrovascular    Overactive bladder 10/24/2018   Amnesia memory loss 05/19/2017   Overweight (BMI 25.0-29.9) 05/09/2017   Lumbar herniated disc 02/01/2017   Medicare annual wellness visit, subsequent 05/03/2016   Lactose intolerance in adult    History of kidney stones    Cervical pain (neck) 12/04/2015   Health maintenance examination 04/29/2014   Paresthesia 04/29/2014  Advanced care planning/counseling discussion 04/29/2014   PFD (pelvic floor dysfunction) 04/29/2014   Controlled diabetes mellitus type 2 with complications ([place]) 89/06/2838   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   Hx of diverticulitis of colon 11/07/2012   Allergic rhinitis 11/07/2012   Hyperlipidemia associated with type 2 diabetes mellitus ([place]) 11/07/2012   Migraine 11/07/2012   Hereditary and idiopathic peripheral neuropathy 11/07/2012   LGSIL Pap smear of vaginal cuff 11/07/2012   GERD (gastroesophageal reflux disease) 11/07/2012   Family history of premature coronary heart disease 11/07/2012    Dana [name], PT, DPT 12/14/2020, 1:27 PM  [facility] [address] [place], [place], [address] Phone: [phone]   Fax:  [phone]  Name: Dana [name] MRN: [id number] Date of Birth: May 08, 1946

## 2020-12-16 ENCOUNTER — Ambulatory Visit: Payer: Medicare HMO

## 2020-12-22 ENCOUNTER — Other Ambulatory Visit: Payer: Self-pay

## 2020-12-22 ENCOUNTER — Encounter: Payer: Self-pay | Admitting: Family Medicine

## 2020-12-22 ENCOUNTER — Ambulatory Visit (INDEPENDENT_AMBULATORY_CARE_PROVIDER_SITE_OTHER): Payer: Medicare HMO | Admitting: Family Medicine

## 2020-12-22 VITALS — BP 120/74 | HR 74 | Temp 97.9°F | Ht 63.0 in | Wt 158.2 lb

## 2020-12-22 DIAGNOSIS — E1169 Type 2 diabetes mellitus with other specified complication: Secondary | ICD-10-CM | POA: Diagnosis not present

## 2020-12-22 DIAGNOSIS — R110 Nausea: Secondary | ICD-10-CM

## 2020-12-22 DIAGNOSIS — K219 Gastro-esophageal reflux disease without esophagitis: Secondary | ICD-10-CM | POA: Diagnosis not present

## 2020-12-22 DIAGNOSIS — E785 Hyperlipidemia, unspecified: Secondary | ICD-10-CM

## 2020-12-22 DIAGNOSIS — R209 Unspecified disturbances of skin sensation: Secondary | ICD-10-CM | POA: Diagnosis not present

## 2020-12-22 LAB — COMPREHENSIVE METABOLIC PANEL
ALT: 14 U/L (ref 0–35)
AST: 13 U/L (ref 0–37)
Albumin: 4.4 g/dL (ref 3.5–5.2)
Alkaline Phosphatase: 65 U/L (ref 39–117)
BUN: 13 mg/dL (ref 6–23)
CO2: 27 mEq/L (ref 19–32)
Calcium: 9.7 mg/dL (ref 8.4–10.5)
Chloride: 109 mEq/L (ref 96–112)
Creatinine, Ser: 0.73 mg/dL (ref 0.40–1.20)
GFR: 81.31 mL/min (ref >60.00)
Glucose, Bld: 101 mg/dL — ABNORMAL HIGH (ref 70–99)
Potassium: 3.8 mEq/L (ref 3.5–5.1)
Sodium: 142 mEq/L (ref 135–145)
Total Bilirubin: 0.7 mg/dL (ref 0.2–1.2)
Total Protein: 7.4 g/dL (ref 6.0–8.3)

## 2020-12-22 LAB — CBC WITH DIFFERENTIAL/PLATELET
Basophils Absolute: 0 10*3/uL (ref 0.0–0.1)
Basophils Relative: 0.4 % (ref 0.0–3.0)
Eosinophils Absolute: 0 10*3/uL (ref 0.0–0.7)
Eosinophils Relative: 0.5 % (ref 0.0–5.0)
HCT: 38.5 % (ref 36.0–46.0)
Hemoglobin: 12.8 g/dL (ref 12.0–15.0)
Lymphocytes Relative: 29.5 % (ref 12.0–46.0)
Lymphs Abs: 1.3 10*3/uL (ref 0.7–4.0)
MCHC: 33.3 g/dL (ref 30.0–36.0)
MCV: 89.9 fl (ref 78.0–100.0)
Monocytes Absolute: 0.6 10*3/uL (ref 0.1–1.0)
Monocytes Relative: 13.5 % — ABNORMAL HIGH (ref 3.0–12.0)
Neutro Abs: 2.5 10*3/uL (ref 1.4–7.7)
Neutrophils Relative %: 56.1 % (ref 43.0–77.0)
Platelets: 215 10*3/uL (ref 150.0–400.0)
RBC: 4.29 Mil/uL (ref 3.87–5.11)
RDW: 14 % (ref 11.5–15.5)
WBC: 4.5 10*3/uL (ref 4.0–10.5)

## 2020-12-22 LAB — LIPASE: Lipase: 36 U/L (ref 11.0–59.0)

## 2020-12-22 MED ORDER — ATORVASTATIN CALCIUM 40 MG PO TABS: 40.0000 mg | ORAL_TABLET | Freq: Every day | ORAL | 1 refills | Status: DC

## 2020-12-22 NOTE — Patient Instructions (Addendum)
Increase omeprazole to '40mg'$  daily for 2-3 weeks.  Labs today. We will check abdominal ultrasound for further evaluation.  Bland diet for now, ensure good fluids as well.

## 2020-12-22 NOTE — Progress Notes (Signed)
Patient ID: Dana [name], female    DOB: 03/08/1946, 75 y.o.   MRN: [id number]  This visit was conducted in person.  BP 120/74   Pulse 74   Temp 97.9 F (36.6 C) (Temporal)   Ht _0  (1.6 m)   Wt 158 lb 4 oz (71.8 kg)   SpO2 99%   BMI 28.03 kg/m    CC: med f/u visit, discuss nausea Subjective:   HPI: Dana [name] is a 75 y.o. female presenting on 12/22/2020 for Follow-up (C/o still feeling nausea and dizziness despite change in Zetia and atorvastain. Although, constipation has improved.  Pt accompanied by husband, [name]- temp 98.0.)   Notes nausea for the past 1+ month. Most days feeling nauseated for majority of the day. No specific precipitating food choices. Notes poor appetite but she still eats regularly. No abdominal pain, fevers/chills, vomiting, blood in stool. No urinary symptoms. She's been using ginger candy with benefit. Zofran may have caused constipation so she stopped this.  Plavix started 08/2020 after CVA.  No dysphagia, no early satiety, no epigastric pain.  10 lb weight loss noted in past 2 months.   Husband thinks she may be mildly depressed. She attributes low motivation to feeling ill.   Cold sensation to lower extremity and constipation both improved off zetia. She also started senokot-S PRN constipation with benefit - she hasn't needed to take this recently.   Continues atorvastatin 67m daily. Now off zetia. She is also off gabapentin - as leg symptoms have improved.  She continues taking omeprazole 213mdaily. Denies GERD symptoms but notes increased belching.      Relevant past medical, surgical, family and social history reviewed and updated as indicated. Interim medical history since our last visit reviewed. Allergies and medications reviewed and updated. Outpatient Medications Prior to Visit  Medication Sig Dispense Refill   acetaminophen (TYLENOL) 325 MG tablet Take 1 tablet (325 mg total) by mouth every 6 (six) hours as needed for mild pain.      Alcohol Swabs (B-D SINGLE USE SWABS REGULAR) PADS Check blood sugar once daily 100 each 0   Blood Glucose Calibration (TRUE METRIX LEVEL 1) Low SOLN Use to check meter 1 each 0   Blood Glucose Monitoring Suppl (TRUE METRIX METER) w/Device KIT Check blood sugar once daily 1 kit 0   butalbital-acetaminophen-caffeine (FIORICET) 50-325-40 MG tablet Take 1 tablet by mouth every 6 (six) hours as needed for headache. 10 tablet 1   co-enzyme Q-10 30 MG capsule Take 100 mg by mouth daily.     fluticasone (FLONASE) 50 MCG/ACT nasal spray USE 2 SPRAYS IN EACH NOSTRIL EVERY DAY (Patient taking differently: Place 2 sprays into both nostrils at bedtime.) 48 g 0   gabapentin (NEURONTIN) 100 MG capsule Take 1-2 capsules (100-200 mg total) by mouth at bedtime as needed. (Patient taking differently: Take 100-200 mg by mouth at bedtime.) 60 capsule 1   glucose blood (TRUE METRIX BLOOD GLUCOSE TEST) test strip Check blood sugar once daily 100 each 3   lactase (LACTAID) 3000 units tablet Take 2 tablets (6,000 Units total) by mouth 3 (three) times daily with meals. (Patient taking differently: Take 6,000 Units by mouth See admin instructions. Take 6,000 units (2 tablets) by mouth up to three times a day WITH DAIRY PRODUCTS)     methocarbamol (ROBAXIN) 500 MG tablet TAKE 1 TABLET (500 MG TOTAL) BY MOUTH 3 (THREE) TIMES DAILY AS NEEDED FOR MUSCLE SPASMS (SEDATION PRECAUTIONS). (Patient taking differently:  Take 500 mg by mouth 3 (three) times daily as needed for muscle spasms (or migraines- sedation precautions).) 60 tablet 1   Multiple Vitamin (MULTIVITAMIN) tablet Take 1 tablet by mouth at bedtime.     omeprazole (PRILOSEC) 20 MG capsule Take 1 capsule (20 mg total) by mouth daily. (Patient taking differently: Take 20 mg by mouth daily before breakfast.) 90 capsule 3   senna (SENOKOT) 8.6 MG tablet Take 1 tablet (8.6 mg total) by mouth daily as needed for constipation.     THERATEARS PF 0.25 % SOLN Place 2 drops into  both eyes in the morning.     topiramate (TOPAMAX) 25 MG tablet Take 3 tablets (75 mg total) by mouth at bedtime. (Patient taking differently: Take 25-50 mg by mouth See admin instructions. Take 25 mg by mouth in the morning and 50 mg at bedtime) 270 tablet 4   triamcinolone cream (KENALOG) 0.1 % APPLY TO AFFECTED AREA TWICE A DAY (Patient taking differently: Apply 1 application topically 2 (two) times daily as needed (to affected areas under the breasts- for irritation).) 80 g 1   TRUEplus Lancets 33G MISC Check blood sugar once daily 100 each 3   vitamin B-12 (CYANOCOBALAMIN) 1000 MCG tablet Take 1 tablet (1,000 mcg total) by mouth every Monday, Wednesday, and Friday.     atorvastatin (LIPITOR) 40 MG tablet TAKE 1 TABLET (40 MG TOTAL) BY MOUTH DAILY. (Patient taking differently: Take 40 mg by mouth at bedtime.) 90 tablet 0   clopidogrel (PLAVIX) 75 MG tablet TAKE 1 TABLET BY MOUTH EVERY DAY 30 tablet 0   ondansetron (ZOFRAN ODT) 4 MG disintegrating tablet Take 1 tablet (4 mg total) by mouth every 8 (eight) hours as needed. (Patient taking differently: Take 4 mg by mouth every 8 (eight) hours as needed for vomiting or nausea (dissolve orally).) 20 tablet 3   clopidogrel (PLAVIX) 75 MG tablet Take 1 tablet (75 mg total) by mouth daily. 90 tablet 3   ezetimibe (ZETIA) 10 MG tablet Take 1 tablet (10 mg total) by mouth daily. (Patient not taking: Reported on 12/22/2020) 30 tablet 3   No facility-administered medications prior to visit.     Per HPI unless specifically indicated in ROS section below Review of Systems  Objective:  BP 120/74   Pulse 74   Temp 97.9 F (36.6 C) (Temporal)   Ht _0  (1.6 m)   Wt 158 lb 4 oz (71.8 kg)   SpO2 99%   BMI 28.03 kg/m   Wt Readings from Last 3 Encounters:  12/22/20 158 lb 4 oz (71.8 kg)  10/19/20 167 lb 3 oz (75.8 kg)  10/12/20 167 lb (75.8 kg)      Physical Exam Vitals and nursing note reviewed.  Constitutional:      Appearance: Normal  appearance. She is not ill-appearing.  Eyes:     Extraocular Movements: Extraocular movements intact.     Pupils: Pupils are equal, round, and reactive to light.  Cardiovascular:     Rate and Rhythm: Normal rate and regular rhythm.     Pulses: Normal pulses.     Heart sounds: Normal heart sounds. No murmur heard. Pulmonary:     Effort: Pulmonary effort is normal. No respiratory distress.     Breath sounds: Normal breath sounds. No wheezing, rhonchi or rales.  Abdominal:     General: Bowel sounds are normal. There is no distension.     Palpations: Abdomen is soft. There is no mass.  Tenderness: There is no abdominal tenderness. There is no right CVA tenderness, left CVA tenderness, guarding or rebound.     Hernia: No hernia is present.  Musculoskeletal:     Right lower leg: No edema.     Left lower leg: No edema.  Skin:    General: Skin is warm and dry.     Findings: No rash.  Neurological:     Mental Status: She is alert.  Psychiatric:        Mood and Affect: Mood normal.        Behavior: Behavior normal.      Results for orders placed or performed in visit on 12/22/20  Comprehensive metabolic panel  Result Value Ref Range   Sodium 142 135 - 145 mEq/L   Potassium 3.8 3.5 - 5.1 mEq/L   Chloride 109 96 - 112 mEq/L   CO2 27 19 - 32 mEq/L   Glucose, Bld 101 (H) 70 - 99 mg/dL   BUN 13 6 - 23 mg/dL   Creatinine, Ser 0.73 0.40 - 1.20 mg/dL   Total Bilirubin 0.7 0.2 - 1.2 mg/dL   Alkaline Phosphatase 65 39 - 117 U/L   AST 13 0 - 37 U/L   ALT 14 0 - 35 U/L   Total Protein 7.4 6.0 - 8.3 g/dL   Albumin 4.4 3.5 - 5.2 g/dL   GFR 81.31 >60.00 mL/min   Calcium 9.7 8.4 - 10.5 mg/dL  CBC with Differential/Platelet  Result Value Ref Range   WBC 4.5 4.0 - 10.5 K/uL   RBC 4.29 3.87 - 5.11 Mil/uL   Hemoglobin 12.8 12.0 - 15.0 g/dL   HCT 38.5 36.0 - 46.0 %   MCV 89.9 78.0 - 100.0 fl   MCHC 33.3 30.0 - 36.0 g/dL   RDW 14.0 11.5 - 15.5 %   Platelets 215.0 150.0 - 400.0 K/uL    Neutrophils Relative % 56.1 43.0 - 77.0 %   Lymphocytes Relative 29.5 12.0 - 46.0 %   Monocytes Relative 13.5 (H) 3.0 - 12.0 %   Eosinophils Relative 0.5 0.0 - 5.0 %   Basophils Relative 0.4 0.0 - 3.0 %   Neutro Abs 2.5 1.4 - 7.7 K/uL   Lymphs Abs 1.3 0.7 - 4.0 K/uL   Monocytes Absolute 0.6 0.1 - 1.0 K/uL   Eosinophils Absolute 0.0 0.0 - 0.7 K/uL   Basophils Absolute 0.0 0.0 - 0.1 K/uL  Lipase  Result Value Ref Range   Lipase 36.0 11.0 - 59.0 U/L   Lab Results  Component Value Date   VITAMINB12 692 09/21/2020    Lab Results  Component Value Date   HGBA1C 6.5 (H) 08/30/2020    Assessment & Plan:  This visit occurred during the SARS-CoV-2 public health emergency.  Safety protocols were in place, including screening questions prior to the visit, additional usage of staff PPE, and extensive cleaning of exam room while observing appropriate contact time as indicated for disinfecting solutions.   Problem List Items Addressed This Visit     Hyperlipidemia associated with type 2 diabetes mellitus ([place])    Now back only on atorvastatin - as there was concern zetia caused constipation.  The ASCVD Risk score [name], et al., 2013) failed to calculate for the following reasons:   The patient has a prior MI or stroke diagnosis       Relevant Medications   atorvastatin (LIPITOR) 40 MG tablet   GERD (gastroesophageal reflux disease)    No significant breakthrough symptoms.  Will increase omeprazole to 23m daily for a few weeks in case hidden GERD contributing to symptoms.       Sensation of cold in lower extremity    This has improved. Now off gabapentin.       Nausea - Primary    Ongoing nausea for the past 1+ month, of unclear etiology, associated with weight loss.  Will check labwork including CBC, CMP, lipase and check abd [place]  rec bland diet and increasing omeprazole to 25mbid.  UTD colon cancer screening, latest cologuard normal 05/2019. ?med related - plavix started  08/2020 after CVA.        Relevant Orders   Comprehensive metabolic panel (Completed)   CBC with Differential/Platelet (Completed)   Lipase (Completed)   USKoreabdomen Complete     Meds ordered this encounter  Medications   atorvastatin (LIPITOR) 40 MG tablet    Sig: Take 1 tablet (40 mg total) by mouth at bedtime.    Dispense:  90 tablet    Refill:  1   Orders Placed This Encounter  Procedures   USKoreabdomen Complete    Standing Status:   Future    Standing Expiration Date:   12/22/2021    Order Specific Question:   Reason for Exam (SYMPTOM  OR DIAGNOSIS REQUIRED)    Answer:   nausea, weight loss    Order Specific Question:   Preferred imaging location?    Answer:   [facility]   Comprehensive metabolic panel   CBC with Differential/Platelet   Lipase    Patient Instructions  Increase omeprazole to 4038maily for 2-3 weeks.  Labs today. We will check abdominal ultrasound for further evaluation.  Bland diet for now, ensure good fluids as well.   Follow up plan: Return if symptoms worsen or fail to improve.  JavRia BushD

## 2020-12-23 DIAGNOSIS — R110 Nausea: Secondary | ICD-10-CM | POA: Insufficient documentation

## 2020-12-23 NOTE — Assessment & Plan Note (Signed)
This has improved. Now off gabapentin.

## 2020-12-23 NOTE — Assessment & Plan Note (Signed)
Now back only on atorvastatin - as there was concern zetia caused constipation.  The ASCVD Risk score [name], et al., 2013) failed to calculate for the following reasons:   The patient has a prior MI or stroke diagnosis

## 2020-12-23 NOTE — Assessment & Plan Note (Signed)
No significant breakthrough symptoms. Will increase omeprazole to '40mg'$  daily for a few weeks in case hidden GERD contributing to symptoms.

## 2020-12-23 NOTE — Assessment & Plan Note (Signed)
Ongoing nausea for the past 1+ month, of unclear etiology, associated with weight loss.  Will check labwork including CBC, CMP, lipase and check abd [place].  rec bland diet and increasing omeprazole to '20mg'$  bid.  UTD colon cancer screening, latest cologuard normal 05/2019. ?med related - plavix started 08/2020 after CVA.

## 2020-12-25 ENCOUNTER — Telehealth: Payer: Self-pay | Admitting: Family Medicine

## 2020-12-25 NOTE — Telephone Encounter (Signed)
Patient called in stated she was given an appointment for her ultrasound on 8/31 . Wants to know if it can be done sooner . Would like a call back

## 2020-12-25 NOTE — Telephone Encounter (Signed)
Spoke with pt relaying Dana [name]'s message.  Pt verbalizes understanding and expresses her thanks.

## 2020-12-26 ENCOUNTER — Other Ambulatory Visit: Payer: Self-pay | Admitting: Family Medicine

## 2020-12-26 MED ORDER — PROMETHAZINE HCL 25 MG PO TABS: 12.5000 mg | ORAL_TABLET | Freq: Two times a day (BID) | ORAL | 0 refills | Status: DC | PRN

## 2021-01-04 ENCOUNTER — Ambulatory Visit (INDEPENDENT_AMBULATORY_CARE_PROVIDER_SITE_OTHER): Payer: Medicare HMO

## 2021-01-04 DIAGNOSIS — I6339 Cerebral infarction due to thrombosis of other cerebral artery: Secondary | ICD-10-CM | POA: Diagnosis not present

## 2021-01-04 DIAGNOSIS — Z9181 History of falling: Secondary | ICD-10-CM

## 2021-01-04 NOTE — Chronic Care Management (AMB) (Signed)
Chronic Care Management   CCM RN Visit Note  01/04/2021 Name: Dana [name] MRN: [id number] DOB: October 22, 1945  Subjective: Dana [name] is a 75 y.o. year old female who is a primary care patient of [name], MD. The care management team was consulted for assistance with disease management and care coordination needs.    Engaged with patient by telephone for follow up visit in response to provider referral for case management and/or care coordination services.   Consent to Services:  The patient was given information about Chronic Care Management services, agreed to services, and gave verbal consent prior to initiation of services.  Please see initial visit note for detailed documentation.   Patient agreed to services and verbal consent obtained.   Assessment: Review of patient past medical history, allergies, medications, health status, including review of consultants reports, laboratory and other test data, was performed as part of comprehensive evaluation and provision of chronic care management services.   SDOH (Social Determinants of Health) assessments and interventions performed:    CCM Care Plan  Allergies  Allergen Reactions   Amitriptyline Other (See Comments)    Worsened migraine and confusion   Lactose Intolerance (Gi) Nausea Only and Other (See Comments)    Abdominal pain, also   Oxybutynin Other (See Comments)    Urinary retention/hesitancy   Tape Other (See Comments)    "Band-aids make my skin red"   Tramadol Other (See Comments)    Causes confusion and dizziness   Niacin And Related Itching   Septra [Sulfamethoxazole-Trimethoprim] Itching    Outpatient Encounter Medications as of 01/04/2021  Medication Sig Note   acetaminophen (TYLENOL) 325 MG tablet Take 1 tablet (325 mg total) by mouth every 6 (six) hours as needed for mild pain. 10/02/2020: ALL MEDICATIONS MUST BE TAKEN WITH APPLESAUCE   atorvastatin (LIPITOR) 40 MG tablet Take 1 tablet (40 mg total)  by mouth at bedtime.    butalbital-acetaminophen-caffeine (FIORICET) 50-325-40 MG tablet Take 1 tablet by mouth every 6 (six) hours as needed for headache. 10/02/2020: ALL MEDICATIONS MUST BE TAKEN WITH APPLESAUCE   clopidogrel (PLAVIX) 75 MG tablet Take 1 tablet (75 mg total) by mouth daily.    co-enzyme Q-10 30 MG capsule Take 100 mg by mouth daily. 10/02/2020: ALL MEDICATIONS MUST BE TAKEN WITH APPLESAUCE   fluticasone (FLONASE) 50 MCG/ACT nasal spray USE 2 SPRAYS IN EACH NOSTRIL EVERY DAY (Patient taking differently: Place 2 sprays into both nostrils at bedtime.)    gabapentin (NEURONTIN) 100 MG capsule Take 1-2 capsules (100-200 mg total) by mouth at bedtime as needed. (Patient taking differently: Take 100-200 mg by mouth at bedtime.) 10/02/2020: ALL MEDICATIONS MUST BE TAKEN WITH APPLESAUCE   lactase (LACTAID) 3000 units tablet Take 2 tablets (6,000 Units total) by mouth 3 (three) times daily with meals. (Patient taking differently: Take 6,000 Units by mouth See admin instructions. Take 6,000 units (2 tablets) by mouth up to three times a day WITH DAIRY PRODUCTS) 10/02/2020: ALL MEDICATIONS MUST BE TAKEN WITH APPLESAUCE   methocarbamol (ROBAXIN) 500 MG tablet TAKE 1 TABLET (500 MG TOTAL) BY MOUTH 3 (THREE) TIMES DAILY AS NEEDED FOR MUSCLE SPASMS (SEDATION PRECAUTIONS). (Patient taking differently: Take 500 mg by mouth 3 (three) times daily as needed for muscle spasms (or migraines- sedation precautions).) 10/02/2020: ALL MEDICATIONS MUST BE TAKEN WITH APPLESAUCE   Multiple Vitamin (MULTIVITAMIN) tablet Take 1 tablet by mouth at bedtime. 10/02/2020: ALL MEDICATIONS MUST BE TAKEN WITH APPLESAUCE   omeprazole (PRILOSEC) 20 MG capsule Take  1 capsule (20 mg total) by mouth daily. (Patient taking differently: Take 20 mg by mouth daily before breakfast.) 01/04/2021: Patient states she is currently taking 2 tablets one time per day for 2 weeks.    promethazine (PHENERGAN) 25 MG tablet Take 0.5-1 tablets (12.5-25 mg  total) by mouth 2 (two) times daily as needed for nausea.    senna (SENOKOT) 8.6 MG tablet Take 1 tablet (8.6 mg total) by mouth daily as needed for constipation.    THERATEARS PF 0.25 % SOLN Place 2 drops into both eyes in the morning. 11/26/2020: Patient states she takes as needed   topiramate (TOPAMAX) 25 MG tablet Take 3 tablets (75 mg total) by mouth at bedtime. (Patient taking differently: Take 25-50 mg by mouth See admin instructions. Take 25 mg by mouth in the morning and 50 mg at bedtime) 10/02/2020: ALL MEDICATIONS MUST BE TAKEN WITH APPLESAUCE   triamcinolone cream (KENALOG) 0.1 % APPLY TO AFFECTED AREA TWICE A DAY (Patient taking differently: Apply 1 application topically 2 (two) times daily as needed (to affected areas under the breasts- for irritation).)    vitamin B-12 (CYANOCOBALAMIN) 1000 MCG tablet Take 1 tablet (1,000 mcg total) by mouth every Monday, Wednesday, and Friday. 10/02/2020: ALL MEDICATIONS MUST BE TAKEN WITH APPLESAUCE   Alcohol Swabs (B-D SINGLE USE SWABS REGULAR) PADS Check blood sugar once daily    Blood Glucose Calibration (TRUE METRIX LEVEL 1) Low SOLN Use to check meter    Blood Glucose Monitoring Suppl (TRUE METRIX METER) w/Device KIT Check blood sugar once daily    glucose blood (TRUE METRIX BLOOD GLUCOSE TEST) test strip Check blood sugar once daily    TRUEplus Lancets 33G MISC Check blood sugar once daily    No facility-administered encounter medications on file as of 01/04/2021.    Patient Active Problem List   Diagnosis Date Noted   Nausea 12/23/2020   Itching of both hands 10/20/2020   Sensation of cold in lower extremity 10/20/2020   Dysesthesia 09/21/2020   Hemiparesis due to recent stroke ([place]) 08/30/2020   Right sided weakness 08/29/2020   Pain in lateral portion of right ankle 07/20/2020   Plantar fasciitis of right foot 05/19/2020   Small vessel disease, cerebrovascular    Overactive bladder 10/24/2018   Amnesia memory loss 05/19/2017    Overweight (BMI 25.0-29.9) 05/09/2017   Lumbar herniated disc 02/01/2017   Medicare annual wellness visit, subsequent 05/03/2016   Lactose intolerance in adult    History of kidney stones    Cervical pain (neck) 12/04/2015   Health maintenance examination 04/29/2014   Paresthesia 04/29/2014   Advanced care planning/counseling discussion 04/29/2014   PFD (pelvic floor dysfunction) 04/29/2014   Controlled diabetes mellitus type 2 with complications ([place]) 37/85/8850   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   Hx of diverticulitis of colon 11/07/2012   Allergic rhinitis 11/07/2012   Hyperlipidemia associated with type 2 diabetes mellitus ([place]) 11/07/2012   Migraine 11/07/2012   Hereditary and idiopathic peripheral neuropathy 11/07/2012   LGSIL Pap smear of vaginal cuff 11/07/2012   GERD (gastroesophageal reflux disease) 11/07/2012   Family history of premature coronary heart disease 11/07/2012    Conditions to be addressed/monitored: Stroke, Falls  Care Plan : Stroke (Adult)  Updates made by Dannielle Karvonen, RN since 01/04/2021 12:00 AM     Problem: Harm or Injury (Stroke, at risk for falls)   Priority: High     Long-Range Goal: Harm or Injury Prevented   Start Date: 11/26/2020  Expected  End Date: 04/07/2021  Recent Progress: On track  Priority: High  Note:   Current Barriers:  Knowledge Deficits related to long term care plan for self management of Stroke / risk for falls Patient reports her outpatient rehab ended on 12/14/2020. She states she is now walking with just a cane.  She reports having pain in her right leg. She states she had the pain when she was in rehab and states it comes an goes. She denies taking anything for the pain or using hot/ cold compress.  Patient denies having any decrease in sensation in right leg. RNCM advised patient to apply heat/ cold alternating 15 min on off.  Advised patient to send message to her provider through MyChart regarding the ongoing leg pain. She  states her next appointment with the neurologist is in November 2022.  Mobility/Transportation/ errands:  Patient using cane for ambulation. Continues to require assistance with errands/ transportation which her husband providers. .  Lightheadedness: Patient reports improvement with the lightheadedness.  She states she is still experiencing nausea daily but not as bad since her doctor increased her omeprazole to 2 capsules 1 time per day.  Patient reports having some improvement with her appetite.  Clinical Goal(s):  patient will demonstrate improved adherence to prescribed treatment plan for decreasing falls as evidenced by patient reporting and review of EMR patient will verbalize using fall risk reduction strategies discussed patient will take medications as prescribed. Patient will attend scheduled follow up visits.   Patient will know the signs/ symptoms of stroke symptoms and know to call 911 at the onset.  Patient will follow a low salt meal plan.  Patient will increase activity as tolerated and as recommended by her doctor Interventions:  Collaboration with [name], MD regarding development and update of comprehensive plan of care as evidenced by provider attestation and co-signature Inter-disciplinary care team collaboration (see longitudinal plan of care) Provided  verbal education re: Potential causes of falls and Fall prevention strategies Reviewed medications and discussed potential side effects of medications such as dizziness and frequent urination Assessed for falls since last encounter. Assessed for signs/ symptoms of stroke Advised patient to use hot/ cold treatment to right leg for pain relief. Advised patient to notify her doctor of ongoing right leg pain.  Discussed signs of bleeding due to patient taking blood thinner.  Patient Goals:  - Apply hot/ cold treatment to your right leg as needed for pain ( 15 min off/ on). Notify your doctor through [place]  of the  ongoing leg pain.  - Utilize cane appropriately with  ambulation - De-clutter walkways - Change positions slowly - Wear secure fitting shoes at all times with ambulation - Utilize home lighting for dim lit areas - Demonstrate self and pet awareness at all times -Continue to increase activity as tolerated and as recommended by your provider.  - Continue to take your medications as prescribed. ( Be mindful while taking a blood thinner notify your doctor for bleeding from gums, bruising, blood in urine) -  Continue to follow up with your doctor as recommended.  Follow Up Plan: The patient has been provided with contact information for the care management team and has been advised to call with any health related questions or concerns.  The care management team will reach out to the patient again over the next 45 days.           Plan:The patient has been provided with contact information for the care management team and  has been advised to call with any health related questions or concerns.  and The care management team will reach out to the patient again over the next 45 days. Quinn Plowman RN,BSN,CCM RN Case Manager [place]  [phone]

## 2021-01-04 NOTE — Patient Instructions (Signed)
Visit Information: Thank you for taking the time to speak with me today.   PATIENT GOALS:  Goals Addressed             This Visit's Progress    Harm or Injury Prevented (stroke, at risk for falls)   On track    Timeframe:  Long-Range Goal Priority:  High Start Date:      11/26/2020                       Expected End Date: 04/07/2021  Follow up:  02/18/2021                  - Apply hot/ cold treatment to your right leg as needed for pain ( 15 min off/ on). Notify your doctor through [place]  of the ongoing leg pain.  - Utilize cane appropriately with  ambulation - De-clutter walkways - Change positions slowly - Wear secure fitting shoes at all times with ambulation - Utilize home lighting for dim lit areas - Demonstrate self and pet awareness at all times -Continue to increase activity as tolerated and as recommended by your provider.  - Continue to take your medications as prescribed. ( Be mindful while taking a blood thinner notify your doctor for bleeding from gums, bruising, blood in urine) -  Continue to follow up with your doctor as recommended.      COMPLETED: Patient Stated          Patient verbalizes understanding of instructions provided today and agrees to view in [place].   The patient has been provided with contact information for the care management team and has been advised to call with any health related questions or concerns.  The care management team will reach out to the patient again over the next 45 days.   Quinn Plowman RN,BSN,CCM RN Case Manager [place]  [phone]

## 2021-01-06 ENCOUNTER — Ambulatory Visit
Admission: RE | Admit: 2021-01-06 | Discharge: 2021-01-06 | Disposition: A | Discharge status: Home / Self Care | Payer: Medicare HMO | Source: Ambulatory Visit | Attending: Family Medicine | Admitting: Family Medicine

## 2021-01-06 ENCOUNTER — Other Ambulatory Visit: Payer: Self-pay

## 2021-01-06 DIAGNOSIS — R110 Nausea: Secondary | ICD-10-CM | POA: Diagnosis not present

## 2021-01-06 DIAGNOSIS — N281 Cyst of kidney, acquired: Secondary | ICD-10-CM | POA: Diagnosis not present

## 2021-01-09 ENCOUNTER — Encounter: Payer: Self-pay | Admitting: Family Medicine

## 2021-01-09 ENCOUNTER — Other Ambulatory Visit: Payer: Self-pay | Admitting: Family Medicine

## 2021-01-09 DIAGNOSIS — N2889 Other specified disorders of kidney and ureter: Secondary | ICD-10-CM

## 2021-01-13 ENCOUNTER — Encounter: Payer: Self-pay | Admitting: Family Medicine

## 2021-01-13 ENCOUNTER — Ambulatory Visit
Admission: RE | Admit: 2021-01-13 | Discharge: 2021-01-13 | Disposition: A | Discharge status: Home / Self Care | Payer: Medicare HMO | Source: Ambulatory Visit | Attending: Family Medicine | Admitting: Family Medicine

## 2021-01-13 ENCOUNTER — Other Ambulatory Visit: Payer: Self-pay

## 2021-01-13 DIAGNOSIS — K863 Pseudocyst of pancreas: Secondary | ICD-10-CM | POA: Diagnosis not present

## 2021-01-13 DIAGNOSIS — K760 Fatty (change of) liver, not elsewhere classified: Secondary | ICD-10-CM | POA: Diagnosis not present

## 2021-01-13 DIAGNOSIS — N2889 Other specified disorders of kidney and ureter: Secondary | ICD-10-CM

## 2021-01-13 DIAGNOSIS — D1803 Hemangioma of intra-abdominal structures: Secondary | ICD-10-CM | POA: Diagnosis not present

## 2021-01-13 DIAGNOSIS — K862 Cyst of pancreas: Secondary | ICD-10-CM | POA: Insufficient documentation

## 2021-01-13 DIAGNOSIS — N281 Cyst of kidney, acquired: Secondary | ICD-10-CM | POA: Diagnosis not present

## 2021-01-13 MED ORDER — GADOBENATE DIMEGLUMINE 529 MG/ML IV SOLN
14.0000 mL | Freq: Once | INTRAVENOUS | Status: AC | PRN
  Administered 2021-01-13: 14 mL via INTRAVENOUS

## 2021-01-18 ENCOUNTER — Telehealth: Payer: Self-pay | Admitting: Neurology

## 2021-01-18 NOTE — Telephone Encounter (Signed)
Pt thought she had a migraine last night then thought it was a mini stroke.  Pt shortly loss vision, then had bright lights in eyes.  Pt states it lasted 15 mins.  She then felt ok after and this morning feels ok but would like a call to discuss what to do if this happens again.  Pt did not go to the hospital.

## 2021-01-18 NOTE — Telephone Encounter (Signed)
Called and spoke to patient regarding the events she was having last night.  She said she has migraines but those have 'shadows' but this was different.  She said she didn't have any 'real' weakness and she doesn't think her speech was affected.    Advised patient if she has any concern if she is having a stroke/TIA, she needs to go to the ED and be evaluated where they have the equipment to r/o what she has going on.  She just had a stroke in April.  Patient denied further questions, verbalized understanding and expressed appreciation for the phone call.

## 2021-01-25 ENCOUNTER — Telehealth: Payer: Self-pay | Admitting: Family Medicine

## 2021-01-25 NOTE — Telephone Encounter (Signed)
Pt called in requesting something for pain stated she having a lot of pain her leg marking hard to walk . Please Advise

## 2021-01-25 NOTE — Telephone Encounter (Signed)
Spoke to patient by telephone and was advised that she has had the left leg pain since starting to use a cane to walk. Patient stated that she has been using arthritis cream and that does not seem to help. Patient stated that she had a stroke back in April and finished physical therapy in August. Patient stated that Dr. Danise Mina told her to try the arthritis cream for the pain. Patient stated that she needs something that will work better than the cream Pharmacy [facility]

## 2021-01-26 ENCOUNTER — Ambulatory Visit: Payer: Medicare HMO | Admitting: Family Medicine

## 2021-01-26 NOTE — Telephone Encounter (Signed)
Happy belated birthday! Where in left leg is she having this pain? Has she retried gabapentin? Would start with this. May also take tylenol 500mg  TID for pain. Rec OV for further eval if ongoing as I haven't formally evaluated this yet.

## 2021-01-26 NOTE — Telephone Encounter (Signed)
Patient rescheduled for an appointment with Dr. Danise Mina tomorrow 01/27/21 at 4:00 pm. Patient stated that she has not problem changing the appointment.

## 2021-01-26 NOTE — Telephone Encounter (Signed)
Patient notified as instructed by telephone and verbalized understanding. Patient stated that she has already scheduled an appointment for next week for the leg pain. Patient stated that the pain is in her left knee and she has had swelling below her knee off and on for over a week. Patient's appointment moved up to today 01/26/21 at 3:20 pm. Patient is aware that the appointment is at [facility]. Appointment cancelled for 02/02/21 since she is coming in today.

## 2021-01-27 ENCOUNTER — Other Ambulatory Visit: Payer: Self-pay

## 2021-01-27 ENCOUNTER — Ambulatory Visit (INDEPENDENT_AMBULATORY_CARE_PROVIDER_SITE_OTHER): Payer: Medicare HMO | Admitting: Family Medicine

## 2021-01-27 ENCOUNTER — Encounter: Payer: Self-pay | Admitting: Family Medicine

## 2021-01-27 DIAGNOSIS — M25562 Pain in left knee: Secondary | ICD-10-CM | POA: Insufficient documentation

## 2021-01-27 DIAGNOSIS — M25561 Pain in right knee: Secondary | ICD-10-CM | POA: Insufficient documentation

## 2021-01-27 NOTE — Assessment & Plan Note (Signed)
Knee strain exacerbated by cane use vs arthritis related knee pain. Not consistent with gout or with inflammatory arthritis. Discussed supportive care measures including knee brace, may continue tylenol and voltaren gel, provided with general knee exercises from [facility] pt advisor. Update if not improving with this for xrays. Pt agrees with plan.

## 2021-01-27 NOTE — Patient Instructions (Addendum)
Hold gabapentin for now.  Continue tylenol and voltaren gel as needed. Max dose tylenol is 3000mg /day. I think you may have knee sprain, possibly from cane use.  Start using neoprene or knee brace to affected knee.  If not better with this, let us know for possible xrays.  Good to see you today.

## 2021-01-27 NOTE — Progress Notes (Signed)
Patient ID: Dana [name], female    DOB: 1945-12-07, 75 y.o.   MRN: [id number]  This visit was conducted in person.  BP 120/68   Pulse 67   Temp 98.2 F (36.8 C) (Temporal)   Ht _0  (1.6 m)   Wt 156 lb 4 oz (70.9 kg)   SpO2 96%   BMI 27.68 kg/m    CC: L leg pain  Subjective:   HPI: Dana [name] is a 75 y.o. female presenting on 01/27/2021 for Leg Pain (C/o left leg pain.  Started about 1 mo ago, worsening since starting with cane.  Pt accompanied by husband, Dana [name]- temp 98.2.)   1 mo h/o L > R knee pain, worse since using cane which she has been using after CVA suffered 08/2020. Outpatient physical therapy ended the first week of August. Pain started at medial knee, progressively worsening, they feel stiff, painful to flex knees. Also noticing some leg swelling to left lateral lower leg.  Notes cracking of knee with walking.  No locking of knee. Notes possible knee instability.  Denies inciting trauma/injury or falls.  She's been treating pain with voltaren gel as well as gabapentin and tylenol 500-1052m PRN for pain.   She noticed improvement in pain when she went back to using previous walker.  No hip or thigh pain, no ankle pain.      Relevant past medical, surgical, family and social history reviewed and updated as indicated. Interim medical history since our last visit reviewed. Allergies and medications reviewed and updated. Outpatient Medications Prior to Visit  Medication Sig Dispense Refill   acetaminophen (TYLENOL) 325 MG tablet Take 1 tablet (325 mg total) by mouth every 6 (six) hours as needed for mild pain.     Alcohol Swabs (B-D SINGLE USE SWABS REGULAR) PADS Check blood sugar once daily 100 each 0   atorvastatin (LIPITOR) 40 MG tablet Take 1 tablet (40 mg total) by mouth at bedtime. 90 tablet 1   Blood Glucose Calibration (TRUE METRIX LEVEL 1) Low SOLN Use to check meter 1 each 0   Blood Glucose Monitoring Suppl (TRUE METRIX METER) w/Device KIT Check  blood sugar once daily 1 kit 0   butalbital-acetaminophen-caffeine (FIORICET) 50-325-40 MG tablet Take 1 tablet by mouth every 6 (six) hours as needed for headache. 10 tablet 1   clopidogrel (PLAVIX) 75 MG tablet Take 1 tablet (75 mg total) by mouth daily. 90 tablet 3   co-enzyme Q-10 30 MG capsule Take 100 mg by mouth daily.     fluticasone (FLONASE) 50 MCG/ACT nasal spray USE 2 SPRAYS IN EACH NOSTRIL EVERY DAY (Patient taking differently: Place 2 sprays into both nostrils at bedtime.) 48 g 0   gabapentin (NEURONTIN) 100 MG capsule Take 1-2 capsules (100-200 mg total) by mouth at bedtime as needed. (Patient taking differently: Take 100-200 mg by mouth at bedtime.) 60 capsule 1   glucose blood (TRUE METRIX BLOOD GLUCOSE TEST) test strip Check blood sugar once daily 100 each 3   lactase (LACTAID) 3000 units tablet Take 2 tablets (6,000 Units total) by mouth 3 (three) times daily with meals. (Patient taking differently: Take 6,000 Units by mouth See admin instructions. Take 6,000 units (2 tablets) by mouth up to three times a day WITH DAIRY PRODUCTS)     methocarbamol (ROBAXIN) 500 MG tablet TAKE 1 TABLET (500 MG TOTAL) BY MOUTH 3 (THREE) TIMES DAILY AS NEEDED FOR MUSCLE SPASMS (SEDATION PRECAUTIONS). (Patient taking differently: Take 500 mg by  mouth 3 (three) times daily as needed for muscle spasms (or migraines- sedation precautions).) 60 tablet 1   Multiple Vitamin (MULTIVITAMIN) tablet Take 1 tablet by mouth at bedtime.     omeprazole (PRILOSEC) 20 MG capsule Take 1 capsule (20 mg total) by mouth daily. (Patient taking differently: Take 20 mg by mouth daily before breakfast.) 90 capsule 3   promethazine (PHENERGAN) 25 MG tablet Take 0.5-1 tablets (12.5-25 mg total) by mouth 2 (two) times daily as needed for nausea. 20 tablet 0   senna (SENOKOT) 8.6 MG tablet Take 1 tablet (8.6 mg total) by mouth daily as needed for constipation.     THERATEARS PF 0.25 % SOLN Place 2 drops into both eyes in the  morning.     topiramate (TOPAMAX) 25 MG tablet Take 3 tablets (75 mg total) by mouth at bedtime. (Patient taking differently: Take 25-50 mg by mouth See admin instructions. Take 25 mg by mouth in the morning and 50 mg at bedtime) 270 tablet 4   triamcinolone cream (KENALOG) 0.1 % APPLY TO AFFECTED AREA TWICE A DAY (Patient taking differently: Apply 1 application topically 2 (two) times daily as needed (to affected areas under the breasts- for irritation).) 80 g 1   TRUEplus Lancets 33G MISC Check blood sugar once daily 100 each 3   vitamin B-12 (CYANOCOBALAMIN) 1000 MCG tablet Take 1 tablet (1,000 mcg total) by mouth every Monday, Wednesday, and Friday.     No facility-administered medications prior to visit.     Per HPI unless specifically indicated in ROS section below Review of Systems  Objective:  BP 120/68   Pulse 67   Temp 98.2 F (36.8 C) (Temporal)   Ht _0  (1.6 m)   Wt 156 lb 4 oz (70.9 kg)   SpO2 96%   BMI 27.68 kg/m   Wt Readings from Last 3 Encounters:  01/27/21 156 lb 4 oz (70.9 kg)  12/22/20 158 lb 4 oz (71.8 kg)  10/19/20 167 lb 3 oz (75.8 kg)      Physical Exam Vitals and nursing note reviewed.  Constitutional:      Appearance: Normal appearance. She is not ill-appearing.  Musculoskeletal:        General: Normal range of motion.     Right lower leg: No edema.     Left lower leg: No edema.     Comments:  R knee WNL L knee exam: No deformity on inspection. Discomfort to palpation anterior knee. No significant effusion/swelling noted. FROM in flex/extension without crepitus. No popliteal fullness. Neg drawer test. Discomfort with mcmurray test but overall negative. No significant pain with valgus/varus stress. Discomfort with PFgrind test. No abnormal patellar mobility.   Skin:    General: Skin is warm and dry.     Findings: No rash.  Neurological:     Mental Status: She is alert.  Psychiatric:        Mood and Affect: Mood normal.         Behavior: Behavior normal.       Assessment & Plan:  This visit occurred during the SARS-CoV-2 public health emergency.  Safety protocols were in place, including screening questions prior to the visit, additional usage of staff PPE, and extensive cleaning of exam room while observing appropriate contact time as indicated for disinfecting solutions.   Problem List Items Addressed This Visit     Left anterior knee pain    Knee strain exacerbated by cane use vs arthritis related knee pain. Not  consistent with gout or with inflammatory arthritis. Discussed supportive care measures including knee brace, may continue tylenol and voltaren gel, provided with general knee exercises from SM pt advisor. Update if not improving with this for xrays. Pt agrees with plan.         No orders of the defined types were placed in this encounter.  No orders of the defined types were placed in this encounter.   Patient Instructions  Hold gabapentin for now.  Continue tylenol and voltaren gel as needed. Max dose tylenol is 3080m/day. I think you may have knee sprain, possibly from cane use.  Start using neoprene or knee brace to affected knee.  If not better with this, let [place] for possible xrays.  Good to see you today.   Follow up plan: Return if symptoms worsen or fail to improve.  JRia Bush MD

## 2021-02-02 ENCOUNTER — Ambulatory Visit: Payer: Medicare HMO | Admitting: Family Medicine

## 2021-02-03 ENCOUNTER — Other Ambulatory Visit: Payer: Self-pay | Admitting: Neurology

## 2021-02-15 ENCOUNTER — Telehealth: Payer: Self-pay | Admitting: *Deleted

## 2021-02-15 ENCOUNTER — Encounter: Payer: Self-pay | Admitting: Family Medicine

## 2021-02-15 DIAGNOSIS — M25562 Pain in left knee: Secondary | ICD-10-CM

## 2021-02-15 DIAGNOSIS — M25561 Pain in right knee: Secondary | ICD-10-CM

## 2021-02-15 NOTE — Chronic Care Management (AMB) (Signed)
  Care Management   Note  02/15/2021 Name: ASHE GRAYBEAL MRN: [id number] DOB: 01-02-1946  Henrine Screws Berrian is a 75 y.o. year old female who is a primary care patient of [name], MD and is actively engaged with the care management team. I reached out to [facility] by phone today to assist with re-scheduling a follow up visit with the RN Case Manager  Follow up plan:  Telephone appointment with care management team member scheduled for: 02/26/2021  Julian Hy, [place], [place] Management  Direct Dial: [phone]

## 2021-02-18 ENCOUNTER — Telehealth: Payer: Medicare HMO

## 2021-02-18 NOTE — Telephone Encounter (Signed)
Open in error

## 2021-02-26 ENCOUNTER — Ambulatory Visit (INDEPENDENT_AMBULATORY_CARE_PROVIDER_SITE_OTHER): Payer: Medicare HMO

## 2021-02-26 DIAGNOSIS — I6339 Cerebral infarction due to thrombosis of other cerebral artery: Secondary | ICD-10-CM | POA: Diagnosis not present

## 2021-02-26 DIAGNOSIS — Z9181 History of falling: Secondary | ICD-10-CM | POA: Diagnosis not present

## 2021-02-26 NOTE — Patient Instructions (Signed)
Visit Information: Thank you for taking the time to speak with me today  PATIENT GOALS:  Goals Addressed             This Visit's Progress    Harm or Injury Prevented (stroke, at risk for falls)   On track    Timeframe:  Long-Range Goal Priority:  High Start Date:      11/26/2020                       Expected End Date: 07/06/2020  Follow up:  05/13/2021                 - Apply hot/ cold treatment to your right leg as needed for pain ( 15 min off/ on). Notify your doctor through [place]  of the ongoing leg pain.  - Utilize cane appropriately with  ambulation - De-clutter walkways - Change positions slowly - Wear secure fitting shoes at all times with ambulation - Utilize home lighting for dim lit areas - Demonstrate self and pet awareness at all times -Continue to increase activity as tolerated and as recommended by your provider.  - Continue to take your medications as prescribed. ( Be mindful while taking a blood thinner notify your doctor for bleeding from gums, bruising, blood in urine) -  Continue to follow up with your doctor as recommended.  - Referral sent to [facility] by primary care provider for knee xrays        Patient verbalizes understanding of instructions provided today and agrees to view in [place].   The patient has been provided with contact information for the care management team and has been advised to call with any health related questions or concerns.  The care management team will reach out to the patient again over the next 2-3 months.   Quinn Plowman RN,BSN,CCM RN Case Manager [place]  [phone]

## 2021-02-26 NOTE — Chronic Care Management (AMB) (Signed)
Chronic Care Management   CCM RN Visit Note  02/26/2021 Name: Dana [name] MRN: [id number] DOB: April 05, 1946  Subjective: Dana [name] is a 75 y.o. year old female who is a primary care patient of [name], MD. The care management team was consulted for assistance with disease management and care coordination needs.    Engaged with patient by telephone for follow up visit in response to provider referral for case management and/or care coordination services.   Consent to Services:  The patient was given information about Chronic Care Management services, agreed to services, and gave verbal consent prior to initiation of services.  Please see initial visit note for detailed documentation.   Patient agreed to services and verbal consent obtained.   Assessment: Review of patient past medical history, allergies, medications, health status, including review of consultants reports, laboratory and other test data, was performed as part of comprehensive evaluation and provision of chronic care management services.   SDOH (Social Determinants of Health) assessments and interventions performed:    CCM Care Plan  Allergies  Allergen Reactions   Amitriptyline Other (See Comments)    Worsened migraine and confusion   Lactose Intolerance (Gi) Nausea Only and Other (See Comments)    Abdominal pain, also   Oxybutynin Other (See Comments)    Urinary retention/hesitancy   Tape Other (See Comments)    "Band-aids make my skin red"   Tramadol Other (See Comments)    Causes confusion and dizziness   Niacin And Related Itching   Septra [Sulfamethoxazole-Trimethoprim] Itching    Outpatient Encounter Medications as of 02/26/2021  Medication Sig Note   acetaminophen (TYLENOL) 325 MG tablet Take 1 tablet (325 mg total) by mouth every 6 (six) hours as needed for mild pain. 10/02/2020: ALL MEDICATIONS MUST BE TAKEN WITH APPLESAUCE   Alcohol Swabs (B-D SINGLE USE SWABS REGULAR) PADS Check blood  sugar once daily    atorvastatin (LIPITOR) 40 MG tablet Take 1 tablet (40 mg total) by mouth at bedtime.    Blood Glucose Calibration (TRUE METRIX LEVEL 1) Low SOLN Use to check meter    Blood Glucose Monitoring Suppl (TRUE METRIX METER) w/Device KIT Check blood sugar once daily    butalbital-acetaminophen-caffeine (FIORICET) 50-325-40 MG tablet Take 1 tablet by mouth every 6 (six) hours as needed for headache. 10/02/2020: ALL MEDICATIONS MUST BE TAKEN WITH APPLESAUCE   clopidogrel (PLAVIX) 75 MG tablet Take 1 tablet (75 mg total) by mouth daily.    co-enzyme Q-10 30 MG capsule Take 100 mg by mouth daily. 10/02/2020: ALL MEDICATIONS MUST BE TAKEN WITH APPLESAUCE   fluticasone (FLONASE) 50 MCG/ACT nasal spray USE 2 SPRAYS IN EACH NOSTRIL EVERY DAY (Patient taking differently: Place 2 sprays into both nostrils at bedtime.)    gabapentin (NEURONTIN) 100 MG capsule Take 1-2 capsules (100-200 mg total) by mouth at bedtime as needed. (Patient not taking: Reported on 02/26/2021) 10/02/2020: ALL MEDICATIONS MUST BE TAKEN WITH APPLESAUCE   glucose blood (TRUE METRIX BLOOD GLUCOSE TEST) test strip Check blood sugar once daily    lactase (LACTAID) 3000 units tablet Take 2 tablets (6,000 Units total) by mouth 3 (three) times daily with meals. (Patient taking differently: Take 6,000 Units by mouth See admin instructions. Take 6,000 units (2 tablets) by mouth up to three times a day WITH DAIRY PRODUCTS) 10/02/2020: ALL MEDICATIONS MUST BE TAKEN WITH APPLESAUCE   methocarbamol (ROBAXIN) 500 MG tablet TAKE 1 TABLET (500 MG TOTAL) BY MOUTH 3 (THREE) TIMES DAILY AS NEEDED FOR  MUSCLE SPASMS (SEDATION PRECAUTIONS). (Patient taking differently: Take 500 mg by mouth 3 (three) times daily as needed for muscle spasms (or migraines- sedation precautions).) 10/02/2020: ALL MEDICATIONS MUST BE TAKEN WITH APPLESAUCE   Multiple Vitamin (MULTIVITAMIN) tablet Take 1 tablet by mouth at bedtime. 10/02/2020: ALL MEDICATIONS MUST BE TAKEN WITH  APPLESAUCE   omeprazole (PRILOSEC) 20 MG capsule Take 1 capsule (20 mg total) by mouth daily. (Patient taking differently: Take 20 mg by mouth daily before breakfast.) 01/04/2021: Patient states she is currently taking 2 tablets one time per day for 2 weeks.    promethazine (PHENERGAN) 25 MG tablet Take 0.5-1 tablets (12.5-25 mg total) by mouth 2 (two) times daily as needed for nausea.    senna (SENOKOT) 8.6 MG tablet Take 1 tablet (8.6 mg total) by mouth daily as needed for constipation.    THERATEARS PF 0.25 % SOLN Place 2 drops into both eyes in the morning. 11/26/2020: Patient states she takes as needed   topiramate (TOPAMAX) 25 MG tablet Take 1-2 tablets (25-50 mg total) by mouth See admin instructions. Take 25 mg by mouth in the morning and 50 mg at bedtime    triamcinolone cream (KENALOG) 0.1 % APPLY TO AFFECTED AREA TWICE A DAY (Patient taking differently: Apply 1 application topically 2 (two) times daily as needed (to affected areas under the breasts- for irritation).)    TRUEplus Lancets 33G MISC Check blood sugar once daily    vitamin B-12 (CYANOCOBALAMIN) 1000 MCG tablet Take 1 tablet (1,000 mcg total) by mouth every Monday, Wednesday, and Friday. 10/02/2020: ALL MEDICATIONS MUST BE TAKEN WITH APPLESAUCE   No facility-administered encounter medications on file as of 02/26/2021.    Patient Active Problem List   Diagnosis Date Noted   Left anterior knee pain 01/27/2021   Cyst of pancreas 01/13/2021   Left kidney mass 01/09/2021   Nausea 12/23/2020   Itching of both hands 10/20/2020   Sensation of cold in lower extremity 10/20/2020   Dysesthesia 09/21/2020   Hemiparesis due to recent stroke ([place]) 08/30/2020   Right sided weakness 08/29/2020   Pain in lateral portion of right ankle 07/20/2020   Plantar fasciitis of right foot 05/19/2020   Small vessel disease, cerebrovascular    Overactive bladder 10/24/2018   Amnesia memory loss 05/19/2017   Overweight (BMI 25.0-29.9) 05/09/2017    Lumbar herniated disc 02/01/2017   Medicare annual wellness visit, subsequent 05/03/2016   Lactose intolerance in adult    History of kidney stones    Cervical pain (neck) 12/04/2015   Health maintenance examination 04/29/2014   Paresthesia 04/29/2014   Advanced care planning/counseling discussion 04/29/2014   PFD (pelvic floor dysfunction) 04/29/2014   Controlled diabetes mellitus type 2 with complications ([place]) 28/78/6767   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   Hx of diverticulitis of colon 11/07/2012   Allergic rhinitis 11/07/2012   Hyperlipidemia associated with type 2 diabetes mellitus ([place]) 11/07/2012   Migraine 11/07/2012   Hereditary and idiopathic peripheral neuropathy 11/07/2012   LGSIL Pap smear of vaginal cuff 11/07/2012   GERD (gastroesophageal reflux disease) 11/07/2012   Family history of premature coronary heart disease 11/07/2012    Conditions to be addressed/monitored: Stroke, Falls  Care Plan : Stroke (Adult)  Updates made by Dannielle Karvonen, RN since 02/26/2021 12:00 AM     Problem: Harm or Injury (Stroke, at risk for falls)   Priority: High     Long-Range Goal: Harm or Injury Prevented   Start Date: 11/26/2020  Expected End Date:  07/06/2021  This Visit's Progress: On track  Recent Progress: On track  Priority: High  Note:   Current Barriers:  Knowledge Deficits related to long term care plan for self management of Stroke / risk for falls Patient denies symptoms of nausea. She states this has resolved since increasing her omeprazole to 2 per day.  She reports she is scheduled for a follow up visit with her primary care provider on 04/07/2021. Patient states she continues to have bilateral knee pain. She states she has taken the tylenol and used the volteran gel as advised by her doctor for 2 weeks. She states this did not help her pain consistently.  She reports informing her primary provider of this via [place].  RNCM informed patient patient chart review that  primary care provider has sent referral to [facility] for her to have xrays done of her knees.  Provided patient with contact number for [place] to call and confirm. Patient verbalized appreciation of assistance.    Mobility/Transportation/ errands:  Patient using cane for ambulation. Continues to require assistance with errands/ transportation which her husband provides .  Lightheadedness:Improvement per patient. Clinical Goal(s):  patient will demonstrate improved adherence to prescribed treatment plan for decreasing falls as evidenced by patient reporting and review of EMR patient will verbalize using fall risk reduction strategies discussed patient will take medications as prescribed. Patient will attend scheduled follow up visits.   Patient will know the signs/ symptoms of stroke symptoms and know to call 911 at the onset.  Patient will follow a low salt meal plan.  Patient will increase activity as tolerated and as recommended by her doctor Interventions:  Collaboration with [name], MD regarding development and update of comprehensive plan of care as evidenced by provider attestation and co-signature Inter-disciplinary care team collaboration (see longitudinal plan of care) Provided  verbal education re: Potential causes of falls and Fall prevention strategies Reviewed medications and discussed potential side effects of medications such as dizziness and frequent urination Assessed for falls since last encounter. Assessed for signs/ symptoms of stroke Advised patient to use hot/ cold treatment to right leg for pain relief. Advised patient to notify her doctor of ongoing right leg pain.  Discussed signs of bleeding due to patient taking blood thinner.  Advised patient of referral to [facility] for bilateral knee xray's Patient Goals:  - Apply hot/ cold treatment to your right leg as needed for pain ( 15 min off/ on). Notify your doctor  through [place]  of the ongoing leg pain.  - Utilize cane appropriately with  ambulation - De-clutter walkways - Change positions slowly - Wear secure fitting shoes at all times with ambulation - Utilize home lighting for dim lit areas - Demonstrate self and pet awareness at all times -Continue to increase activity as tolerated and as recommended by your provider.  - Continue to take your medications as prescribed. ( Be mindful while taking a blood thinner notify your doctor for bleeding from gums, bruising, blood in urine) -  Continue to follow up with your doctor as recommended.  - Referral sent to [facility] by primary care provider for knee xrays      Plan:The patient has been provided with contact information for the care management team and has been advised to call with any health related questions or concerns.  The care management team will reach out to the patient again over the next 2-3 months. Quinn Plowman RN,BSN,CCM RN Case Manager [facility]  [place]  [phone]

## 2021-02-27 ENCOUNTER — Ambulatory Visit
Admission: RE | Admit: 2021-02-27 | Discharge: 2021-02-27 | Disposition: A | Discharge status: Home / Self Care | Payer: Medicare HMO | Source: Ambulatory Visit | Attending: Family Medicine | Admitting: Family Medicine

## 2021-02-27 ENCOUNTER — Ambulatory Visit
Admission: RE | Admit: 2021-02-27 | Discharge: 2021-02-27 | Disposition: A | Discharge status: Home / Self Care | Payer: Medicare HMO | Attending: Family Medicine | Admitting: Family Medicine

## 2021-02-27 DIAGNOSIS — M25562 Pain in left knee: Secondary | ICD-10-CM

## 2021-02-27 DIAGNOSIS — M25561 Pain in right knee: Secondary | ICD-10-CM | POA: Insufficient documentation

## 2021-03-09 ENCOUNTER — Other Ambulatory Visit: Payer: Self-pay | Admitting: Family Medicine

## 2021-03-09 MED ORDER — OSELTAMIVIR PHOSPHATE 75 MG PO CAPS: 75.0000 mg | ORAL_CAPSULE | Freq: Every day | ORAL | 0 refills | Status: DC

## 2021-03-09 NOTE — Progress Notes (Signed)
Husband with flu.. sent in rx for prophylaxis

## 2021-03-10 MED ORDER — OSELTAMIVIR PHOSPHATE 75 MG PO CAPS: 75.0000 mg | ORAL_CAPSULE | Freq: Every day | ORAL | 0 refills | Status: DC

## 2021-03-10 NOTE — Addendum Note (Signed)
Addended by: Carter Kitten on: 03/10/2021 10:08 AM   Modules accepted: Orders

## 2021-03-10 NOTE — Progress Notes (Signed)
[facility] was out of Tamiflu.  Dana [name] request Rx get resent to [facility].  Rx sent as requested.

## 2021-03-17 ENCOUNTER — Other Ambulatory Visit: Payer: Self-pay | Admitting: Family Medicine

## 2021-03-17 DIAGNOSIS — L282 Other prurigo: Secondary | ICD-10-CM

## 2021-03-18 NOTE — Telephone Encounter (Signed)
Kenalog cream Last filled:  09/25/20, #80 g Last OV: 01/27/21, L knee pain Next OV: 04/07/21, 6 mo f/u

## 2021-03-29 ENCOUNTER — Encounter: Payer: Self-pay | Admitting: Neurology

## 2021-03-29 ENCOUNTER — Ambulatory Visit: Payer: Medicare HMO | Admitting: Neurology

## 2021-03-29 ENCOUNTER — Other Ambulatory Visit: Payer: Self-pay

## 2021-03-29 VITALS — BP 138/76 | HR 62 | Ht 63.0 in | Wt 157.5 lb

## 2021-03-29 DIAGNOSIS — G43109 Migraine with aura, not intractable, without status migrainosus: Secondary | ICD-10-CM

## 2021-03-29 DIAGNOSIS — I6339 Cerebral infarction due to thrombosis of other cerebral artery: Secondary | ICD-10-CM | POA: Diagnosis not present

## 2021-03-29 DIAGNOSIS — I639 Cerebral infarction, unspecified: Secondary | ICD-10-CM | POA: Insufficient documentation

## 2021-03-29 DIAGNOSIS — R269 Unspecified abnormalities of gait and mobility: Secondary | ICD-10-CM | POA: Diagnosis not present

## 2021-03-29 MED ORDER — TOPIRAMATE 25 MG PO TABS: 25.0000 mg | ORAL_TABLET | ORAL | 4 refills | Status: DC

## 2021-03-29 MED ORDER — UBRELVY 50 MG PO TABS: ORAL_TABLET | ORAL | 11 refills | Status: DC

## 2021-03-29 NOTE — Progress Notes (Signed)
ASSESSMENT AND PLAN 75 y.o. 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   I saw her previously in 2016 for chronic migraine headaches, last visit was on January 14, 2015.   She reported a history of migraine since 20s, her typical migraine started with visual aura, shadow coming down from one side of her visual field, lasting 5-10 minutes, then followed by a severe pounding headaches, piercing pain, movement would exacerbate her headaches, her headache usually lasts for one day.    At time of evaluation in 2016, she complains of increased frequency of headaches, in addition, she reported few episode of headache with associated confusion, language difficulty, longer lasting up to 24 hours,    She was taking Relafen as needed for headaches, which seems to work for her,   She lost to follow-up because overall her headache was under good control, only has moderate migraine headaches every 2 to 3 months, but began to experience more frequent and prolonged headaches since November 2019, in December, she suffered migraine lasting for 5 days, despite multiple home medication treatment, NSAIDs, Excedrin Migraine, Tylenol, without helping her symptoms, also failed to be relieved by emergency treatment, Toradol injection, during the intense headache, she also describes visual distortions, hallucinations, her husband face  was distorted, the blade of the ceiling fan began to move by itself, she was confused, unsteady gait   Previously she was taking amitriptyline complains of confusion slow thinking with the medications,   I personally reviewed MRI of the brain in January 2019, there was no acute abnormality, generalized atrophy, moderate supratentorium small vessel disease.   CT head in December 2019, there was no acute abnormality   Labs normal BMP, CBC, A1C 6.4  Update March 29, 2021: She is accompanied by her son at today's clinical visit, overall doing well, recovering well from her left thalamic stroke, still ambulate with mild right arm and leg weakness,  She is on Plavix only,    In addition, she has intermittent migraine with aura, preceded by visual aura, shadowing in her visual field, sometimes without headache, sometimes with mild retro-orbital area headache, often improved by Tylenol as needed,  She also complains of bilateral knee pain, some improvement with Tylenol and Voltaren gel, has pending appointment with primary care physician   REVIEW OF SYSTEMS: Out of a complete 14 system review of symptoms, the patient complains only of the following symptoms, and all other reviewed systems are negative.  See HPI  ALLERGIES: Allergies  Allergen Reactions   Amitriptyline Other (See Comments)    Worsened migraine and confusion   Lactose Intolerance (Gi) Nausea Only and Other (See Comments)    Abdominal pain, also   Oxybutynin Other (See Comments)    Urinary retention/hesitancy   Tape Other (See Comments)    "Band-aids make my skin red"   Tramadol Other (See Comments)    Causes confusion and dizziness  Niacin And Related Itching   Septra [Sulfamethoxazole-Trimethoprim] Itching    HOME MEDICATIONS: Outpatient Medications Prior to Visit  Medication Sig Dispense Refill   acetaminophen (TYLENOL) 325 MG tablet Take 1 tablet (325 mg total) by mouth every 6 (six) hours as needed for mild  pain.     Alcohol Swabs (B-D SINGLE USE SWABS REGULAR) PADS Check blood sugar once daily 100 each 0   atorvastatin (LIPITOR) 40 MG tablet Take 1 tablet (40 mg total) by mouth at bedtime. 90 tablet 1   Blood Glucose Calibration (TRUE METRIX LEVEL 1) Low SOLN Use to check meter 1 each 0   Blood Glucose Monitoring Suppl (TRUE METRIX METER) w/Device KIT Check blood sugar once daily 1 kit 0   butalbital-acetaminophen-caffeine (FIORICET) 50-325-40 MG tablet Take 1 tablet by mouth every 6 (six) hours as needed for headache. 10 tablet 1   clopidogrel (PLAVIX) 75 MG tablet Take 1 tablet (75 mg total) by mouth daily. 90 tablet 3   co-enzyme Q-10 30 MG capsule Take 100 mg by mouth daily.     fluticasone (FLONASE) 50 MCG/ACT nasal spray USE 2 SPRAYS IN EACH NOSTRIL EVERY DAY 48 g 2   gabapentin (NEURONTIN) 100 MG capsule Take 1-2 capsules (100-200 mg total) by mouth at bedtime as needed. 60 capsule 1   glucose blood (TRUE METRIX BLOOD GLUCOSE TEST) test strip Check blood sugar once daily 100 each 3   lactase (LACTAID) 3000 units tablet Take 2 tablets (6,000 Units total) by mouth 3 (three) times daily with meals. (Patient taking differently: Take 6,000 Units by mouth See admin instructions. Take 6,000 units (2 tablets) by mouth up to three times a day WITH DAIRY PRODUCTS)     methocarbamol (ROBAXIN) 500 MG tablet TAKE 1 TABLET (500 MG TOTAL) BY MOUTH 3 (THREE) TIMES DAILY AS NEEDED FOR MUSCLE SPASMS (SEDATION PRECAUTIONS). (Patient taking differently: Take 500 mg by mouth 3 (three) times daily as needed for muscle spasms (or migraines- sedation precautions).) 60 tablet 1   Multiple Vitamin (MULTIVITAMIN) tablet Take 1 tablet by mouth at bedtime.     omeprazole (PRILOSEC) 20 MG capsule TAKE 1 CAPSULE (20 MG TOTAL) BY MOUTH DAILY. 90 capsule 2   promethazine (PHENERGAN) 25 MG tablet Take 0.5-1 tablets (12.5-25 mg total) by mouth 2 (two) times daily as needed for nausea. 20 tablet 0   senna (SENOKOT) 8.6 MG tablet  Take 1 tablet (8.6 mg total) by mouth daily as needed for constipation.     THERATEARS PF 0.25 % SOLN Place 2 drops into both eyes in the morning.     topiramate (TOPAMAX) 25 MG tablet Take 1-2 tablets (25-50 mg total) by mouth See admin instructions. Take 25 mg by mouth in the morning and 50 mg at bedtime 270 tablet 1   triamcinolone cream (KENALOG) 0.1 % APPLY TO AFFECTED AREA TWICE A DAY 80 g 1   TRUEplus Lancets 33G MISC Check blood sugar once daily 100 each 3   vitamin B-12 (CYANOCOBALAMIN) 1000 MCG tablet Take 1 tablet (1,000 mcg total) by mouth every Monday, Wednesday, and Friday.     oseltamivir (TAMIFLU) 75 MG capsule Take 1 capsule (75 mg total) by mouth daily. 10 capsule 0   No facility-administered medications prior to visit.    PAST MEDICAL HISTORY: Past Medical History:  Diagnosis Date   Allergy    Anemia    Arthritis    Cataract    Controlled type 2 diabetes mellitus without complication, without long-term current use  of insulin ([place])    Diverticulitis    History of chicken pox    Hyperlipidemia    Kidney stone    Lactose intolerance in adult    Migraine    Stroke [facility])    Urge incontinence 04/29/2014    PAST SURGICAL HISTORY: Past Surgical History:  Procedure Laterality Date   ABDOMINAL HYSTERECTOMY  2006   cervix remains, menorrhagia   [place]   right milk duct removed   CARPAL TUNNEL RELEASE Right 02/2017   CHOLECYSTECTOMY  1971   COLONOSCOPY  11/2005   diverticulosis o/w WNL Mikel Cella)   DEXA  2015   spine -0.4, hip -1.0 WNL   LITHOTRIPSY     LUMBAR EPIDURAL INJECTION Left 10/2017   transforaminal L L3/4 (Dr Maryjean Ka)   TUBAL LIGATION      FAMILY HISTORY: Family History  Problem Relation Age of Onset   Heart disease Mother 90   Hyperlipidemia Mother    Hyperlipidemia Father    Stroke Father    Heart disease Father    Diabetes Father    Fibromyalgia Sister     Hyperlipidemia Sister    Heart disease Brother    Diabetes Brother     SOCIAL HISTORY: Social History   Socioeconomic History   Marital status: Married    Spouse name: [name]   Number of children: 1   Years of education: 12   Highest education level: Not on file  Occupational History   Occupation: Retired    Comment: Retired  Tobacco Use   Smoking status: Never   Smokeless tobacco: Never  Scientific laboratory technician Use: Never used  Substance and Sexual Activity   Alcohol use: No    Alcohol/week: 0.0 standard drinks   Drug use: No   Sexual activity: Not Currently  Other Topics Concern   Not on file  Social History Narrative   Patient is married and lives at home with her husband [name]). Patient is retired.    Edu: high school education.   Right handed.   Exercise 3 times a week.   Diet: good water, fruits/vegetables daily   Caffeine: None      HAs living will, HCPOA, full code   Social Determinants of Health   Financial Resource Strain: Not on file  Food Insecurity: No Food Insecurity   Worried About Charity fundraiser in the Last Year: Never true   Ran Out of Food in the Last Year: Never true  Transportation Needs: No Transportation Needs   Lack of Transportation (Medical): No   Lack of Transportation (Non-Medical): No  Physical Activity: Not on file  Stress: Not on file  Social Connections: Not on file  Intimate Partner Violence: Not on file   PHYSICAL EXAM  Vitals:   03/29/21 1135  BP: 138/76  Pulse: 62   There is no height or weight on file to calculate BMI.  Generalized: Well developed, in no acute distress  Neurological examination  Mentation: Alert oriented to time, place, history taking. Follows all commands speech and language fluent Cranial nerve II-XII: Pupils were equal round reactive to light. Extraocular movements were full, visual field were full on confrontational test. Facial sensation and strength were normal. Head turning and shoulder shrug  were normal and symmetric. Motor: The motor testing reveals 5 over 5 strength of all 4 extremities. Good symmetric motor tone is noted throughout. No right  sided weakness noted. Sensory: Sensory testing is intact to soft touch on all 4 extremities. No evidence of extinction is noted. Sensory exam is normal, subjective paresthesia of tingling to right upper extremity.  Coordination: Cerebellar testing reveals good finger-nose-finger and heel-to-shin bilaterally.  Gait and station: Gait is wide based, cautious, using rollator Reflexes: Deep tendon reflexes are symmetric and normal bilaterally.   DIAGNOSTIC DATA (LABS, IMAGING, TESTING) - I reviewed patient records, labs, notes, testing and imaging myself where available.  Lab Results  Component Value Date   WBC 4.5 12/22/2020   HGB 12.8 12/22/2020   HCT 38.5 12/22/2020   MCV 89.9 12/22/2020   PLT 215.0 12/22/2020      Component Value Date/Time   NA 142 12/22/2020 1157   K 3.8 12/22/2020 1157   CL 109 12/22/2020 1157   CO2 27 12/22/2020 1157   GLUCOSE 101 (H) 12/22/2020 1157   BUN 13 12/22/2020 1157   CREATININE 0.73 12/22/2020 1157   CALCIUM 9.7 12/22/2020 1157   PROT 7.4 12/22/2020 1157   ALBUMIN 4.4 12/22/2020 1157   AST 13 12/22/2020 1157   ALT 14 12/22/2020 1157   ALKPHOS 65 12/22/2020 1157   BILITOT 0.7 12/22/2020 1157   GFRNONAA >60 10/02/2020 1017   GFRAA >60 04/22/2018 1325   Lab Results  Component Value Date   CHOL 149 08/30/2020   HDL 52 08/30/2020   LDLCALC 77 08/30/2020   LDLDIRECT 84.0 09/21/2020   TRIG 101 08/30/2020   CHOLHDL 2.9 08/30/2020   Lab Results  Component Value Date   HGBA1C 6.5 (H) 08/30/2020   Lab Results  Component Value Date   VITAMINB12 692 09/21/2020   Lab Results  Component Value Date   TSH 1.41 10/20/2020     Marcial Pacas, M.D. Ph.D.  [facility] [place], [place] [address] Phone: [phone] Fax:      [phone]

## 2021-03-29 NOTE — Patient Instructions (Signed)
Meds ordered this encounter  Medications   topiramate (TOPAMAX) 25 MG tablet    Sig: Take 1-2 tablets (25-50 mg total) by mouth See admin instructions. Take 25 mg by mouth in the morning and 50 mg at bedtime    Dispense:  270 tablet    Refill:  4   Ubrogepant (UBRELVY) 50 MG TABS    Sig: Take 1 tab at onset of migraine.  May repeat in 2 hrs, if needed.  Max dose: 2 tabs/day. This is a 30 day prescription.    Dispense:  12 tablet    Refill:  11

## 2021-04-06 ENCOUNTER — Other Ambulatory Visit: Payer: Self-pay | Admitting: Neurology

## 2021-04-07 ENCOUNTER — Ambulatory Visit (INDEPENDENT_AMBULATORY_CARE_PROVIDER_SITE_OTHER): Payer: Medicare HMO | Admitting: Family Medicine

## 2021-04-07 ENCOUNTER — Encounter: Payer: Self-pay | Admitting: Family Medicine

## 2021-04-07 ENCOUNTER — Other Ambulatory Visit: Payer: Self-pay

## 2021-04-07 VITALS — BP 136/70 | HR 73 | Temp 98.0°F | Ht 63.0 in | Wt 158.0 lb

## 2021-04-07 DIAGNOSIS — M25562 Pain in left knee: Secondary | ICD-10-CM | POA: Diagnosis not present

## 2021-04-07 DIAGNOSIS — E118 Type 2 diabetes mellitus with unspecified complications: Secondary | ICD-10-CM | POA: Diagnosis not present

## 2021-04-07 DIAGNOSIS — J069 Acute upper respiratory infection, unspecified: Secondary | ICD-10-CM | POA: Diagnosis not present

## 2021-04-07 DIAGNOSIS — M25561 Pain in right knee: Secondary | ICD-10-CM | POA: Diagnosis not present

## 2021-04-07 DIAGNOSIS — G43109 Migraine with aura, not intractable, without status migrainosus: Secondary | ICD-10-CM | POA: Diagnosis not present

## 2021-04-07 LAB — POCT GLYCOSYLATED HEMOGLOBIN (HGB A1C): Hemoglobin A1C: 6.1 % — AB (ref 4.0–5.6)

## 2021-04-07 NOTE — Assessment & Plan Note (Signed)
Chronic, great control based on A1c today. Does not check sugars at home.

## 2021-04-07 NOTE — Assessment & Plan Note (Signed)
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 Home exercises did help. xrays reassuring.  Now notes posterior knee pain worse with flexion.  Will refer to outpatient PT for further eval/treatment.

## 2021-04-07 NOTE — Patient Instructions (Addendum)
We will refer you to outpatient physical therapy for ongoing knee pain.  Schedule eye doctor appointment.  Stay hydrated with plenty of fluids, rest, honey with lemon and/or herbal tea, may use cough drops or throat lozenges.  Return in 6 months for physical/wellness visit.

## 2021-04-07 NOTE — Assessment & Plan Note (Signed)
Supportive care measures reviewed as per pt instructions. Update if not improving with this. Pt agrees with plan.

## 2021-04-07 NOTE — Progress Notes (Signed)
Patient ID: Dana [name], female    DOB: 1945-11-10, 75 y.o.   MRN: [id number]  This visit was conducted in person.  BP 136/70   Pulse 73   Temp 98 F (36.7 C) (Temporal)   Ht 5' 3" (1.6 m)   Wt 158 lb (71.7 kg)   SpO2 97%   BMI 27.99 kg/m    CC: 6 mo f/u visit  Subjective:   HPI: Dana [name] is a 75 y.o. female presenting on 04/07/2021 for Follow-up (Here for 6 mo f/u.)   URI symptoms started 5 days ago, progressively worsening. Currently throat feels tight, hoarseness, blowing nose, head congestion, some PNDrainage. No cough, fever, ear pain, chest congestion, body aches, new HA. She's been taking dayquil and tylenol. Husband currently with COPD exacerbation and bronchitis.   Recently saw neurology for h/o L thalamic capsular infarct 08/2020, continues plavix, as well as migraines on topamax 25/30m daily, recently started on ubrelvy 562mPRN abortively (insurance won't cover this).   Bilat knee pain - xrays reassuring. - rec runner's knee exercises - anterior knee pain has improved, but she notes stiffness and posterionr knee pain with knee flexion.   DM - does not regularly check sugars: none recently. Compliant with antihyperglycemic regimen which includes: diet controlled. Denies low sugars or hypoglycemic symptoms. Denies paresthesias, blurry vision. Last diabetic eye exam 04/2020. Glucometer brand: True Metrix. Last foot exam: 03/2020 - due. DSME: started 2019.  Lab Results  Component Value Date   HGBA1C 6.1 (A) 04/07/2021   Diabetic Foot Exam - Simple   Simple Foot Form Diabetic Foot exam was performed with the following findings: Yes 04/07/2021  9:24 AM  Visual Inspection No deformities, no ulcerations, no other skin breakdown bilaterally: Yes Sensation Testing Intact to touch and monofilament testing bilaterally: Yes Pulse Check Posterior Tibialis and Dorsalis pulse intact bilaterally: Yes Comments    Lab Results  Component Value Date   MICROALBUR 4.8 (H)  09/21/2020        Relevant past medical, surgical, family and social history reviewed and updated as indicated. Interim medical history since our last visit reviewed. Allergies and medications reviewed and updated. Outpatient Medications Prior to Visit  Medication Sig Dispense Refill   acetaminophen (TYLENOL) 325 MG tablet Take 1 tablet (325 mg total) by mouth every 6 (six) hours as needed for mild pain.     Alcohol Swabs (B-D SINGLE USE SWABS REGULAR) PADS Check blood sugar once daily 100 each 0   atorvastatin (LIPITOR) 40 MG tablet Take 1 tablet (40 mg total) by mouth at bedtime. 90 tablet 1   Blood Glucose Calibration (TRUE METRIX LEVEL 1) Low SOLN Use to check meter 1 each 0   Blood Glucose Monitoring Suppl (TRUE METRIX METER) w/Device KIT Check blood sugar once daily 1 kit 0   butalbital-acetaminophen-caffeine (FIORICET) 50-325-40 MG tablet Take 1 tablet by mouth every 6 (six) hours as needed for headache. 10 tablet 1   clopidogrel (PLAVIX) 75 MG tablet Take 1 tablet (75 mg total) by mouth daily. 90 tablet 3   co-enzyme Q-10 30 MG capsule Take 100 mg by mouth daily.     fluticasone (FLONASE) 50 MCG/ACT nasal spray USE 2 SPRAYS IN EACH NOSTRIL EVERY DAY 48 g 2   gabapentin (NEURONTIN) 100 MG capsule Take 1-2 capsules (100-200 mg total) by mouth at bedtime as needed. 60 capsule 1   glucose blood (TRUE METRIX BLOOD GLUCOSE TEST) test strip Check blood sugar once daily 100 each  3   lactase (LACTAID) 3000 units tablet Take 2 tablets (6,000 Units total) by mouth 3 (three) times daily with meals. (Patient taking differently: Take 6,000 Units by mouth See admin instructions. Take 6,000 units (2 tablets) by mouth up to three times a day WITH DAIRY PRODUCTS)     methocarbamol (ROBAXIN) 500 MG tablet TAKE 1 TABLET (500 MG TOTAL) BY MOUTH 3 (THREE) TIMES DAILY AS NEEDED FOR MUSCLE SPASMS (SEDATION PRECAUTIONS). (Patient taking differently: Take 500 mg by mouth 3 (three) times daily as needed for muscle  spasms (or migraines- sedation precautions).) 60 tablet 1   Multiple Vitamin (MULTIVITAMIN) tablet Take 1 tablet by mouth at bedtime.     omeprazole (PRILOSEC) 20 MG capsule TAKE 1 CAPSULE (20 MG TOTAL) BY MOUTH DAILY. 90 capsule 2   promethazine (PHENERGAN) 25 MG tablet Take 0.5-1 tablets (12.5-25 mg total) by mouth 2 (two) times daily as needed for nausea. 20 tablet 0   senna (SENOKOT) 8.6 MG tablet Take 1 tablet (8.6 mg total) by mouth daily as needed for constipation.     THERATEARS PF 0.25 % SOLN Place 2 drops into both eyes in the morning.     topiramate (TOPAMAX) 25 MG tablet Take 1-2 tablets (25-50 mg total) by mouth See admin instructions. Take 25 mg by mouth in the morning and 50 mg at bedtime 270 tablet 4   triamcinolone cream (KENALOG) 0.1 % APPLY TO AFFECTED AREA TWICE A DAY 80 g 1   TRUEplus Lancets 33G MISC Check blood sugar once daily 100 each 3   Ubrogepant (UBRELVY) 50 MG TABS Take 1 tab at onset of migraine.  May repeat in 2 hrs, if needed.  Max dose: 2 tabs/day. This is a 30 day prescription. 12 tablet 11   vitamin B-12 (CYANOCOBALAMIN) 1000 MCG tablet Take 1 tablet (1,000 mcg total) by mouth every Monday, Wednesday, and Friday.     No facility-administered medications prior to visit.     Per HPI unless specifically indicated in ROS section below Review of Systems  Objective:  BP 136/70   Pulse 73   Temp 98 F (36.7 C) (Temporal)   Ht 5' 3" (1.6 m)   Wt 158 lb (71.7 kg)   SpO2 97%   BMI 27.99 kg/m   Wt Readings from Last 3 Encounters:  04/07/21 158 lb (71.7 kg)  03/29/21 157 lb 8 oz (71.4 kg)  01/27/21 156 lb 4 oz (70.9 kg)      Physical Exam Vitals and nursing note reviewed.  Constitutional:      Appearance: Normal appearance. She is not ill-appearing.  HENT:     Head: Normocephalic and atraumatic.     Right Ear: Tympanic membrane, ear canal and external ear normal. There is no impacted cerumen.     Left Ear: Tympanic membrane, ear canal and external ear  normal. There is no impacted cerumen.     Nose: Nose normal. No congestion or rhinorrhea.     Mouth/Throat:     Mouth: Mucous membranes are moist.     Pharynx: Oropharynx is clear. No oropharyngeal exudate or posterior oropharyngeal erythema.  Eyes:     Extraocular Movements: Extraocular movements intact.     Conjunctiva/sclera: Conjunctivae normal.     Pupils: Pupils are equal, round, and reactive to light.  Cardiovascular:     Rate and Rhythm: Normal rate and regular rhythm.     Pulses: Normal pulses.     Heart sounds: Normal heart sounds. No murmur heard. Pulmonary:  Effort: Pulmonary effort is normal. No respiratory distress.     Breath sounds: Normal breath sounds. No wheezing, rhonchi or rales.  Musculoskeletal:     Right lower leg: No edema.     Left lower leg: No edema.  Lymphadenopathy:     Head:     Right side of head: No submental, submandibular, tonsillar, preauricular or posterior auricular adenopathy.     Left side of head: No submental, submandibular, tonsillar, preauricular or posterior auricular adenopathy.     Cervical: No cervical adenopathy.     Right cervical: No superficial cervical adenopathy.    Left cervical: No superficial cervical adenopathy.     Upper Body:     Right upper body: No supraclavicular adenopathy.     Left upper body: No supraclavicular adenopathy.  Skin:    General: Skin is warm and dry.     Findings: No rash.  Neurological:     Mental Status: She is alert.  Psychiatric:        Mood and Affect: Mood normal.        Behavior: Behavior normal.      Results for orders placed or performed in visit on 04/07/21  POCT glycosylated hemoglobin (Hb A1C)  Result Value Ref Range   Hemoglobin A1C 6.1 (A) 4.0 - 5.6 %   HbA1c POC (<> result, manual entry)     HbA1c, POC (prediabetic range)     HbA1c, POC (controlled diabetic range)      Assessment & Plan:  This visit occurred during the SARS-CoV-2 public health emergency.  Safety protocols  were in place, including screening questions prior to the visit, additional usage of staff PPE, and extensive cleaning of exam room while observing appropriate contact time as indicated for disinfecting solutions.   Problem List Items Addressed This Visit     Migraine    This is followed by neurology Insurance did not cover ubrelvy abortively - working with neuro office on finding alternative.       Controlled diabetes mellitus type 2 with complications (HCC) - Primary    Chronic, great control based on A1c today. Does not check sugars at home.       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 Home exercises did help. xrays reassuring.  Now notes posterior knee pain worse with flexion.  Will refer to outpatient PT for further eval/treatment.       Relevant Orders   Ambulatory referral to Physical Therapy   Viral URI    Supportive care measures reviewed as per pt instructions. Update if not improving with this. Pt agrees with plan.         No orders of the defined types were placed in this encounter.  Orders Placed This Encounter  Procedures   Ambulatory referral to Physical Therapy    Referral Priority:   Routine    Referral Type:   Physical Medicine    Referral Reason:   Specialty Services Required    Requested Specialty:   Physical Therapy    Number of Visits Requested:   1   POCT glycosylated hemoglobin (Hb A1C)     Patient Instructions  We will refer you to outpatient physical therapy for ongoing knee pain.  Schedule eye doctor appointment.  Stay hydrated with plenty of fluids, rest, honey with lemon and/or herbal tea, may use cough drops or throat lozenges.  Return in  6 months for physical/wellness visit.   Follow up plan: Return in about 6 months (around 10/05/2021) for annual exam, prior fasting for blood work, medicare wellness visit.  [name], MD

## 2021-04-07 NOTE — Assessment & Plan Note (Signed)
This is followed by neurology Insurance did not cover ubrelvy abortively - working with neuro office on finding alternative.

## 2021-04-09 ENCOUNTER — Telehealth: Payer: Self-pay | Admitting: Family Medicine

## 2021-04-09 DIAGNOSIS — J069 Acute upper respiratory infection, unspecified: Secondary | ICD-10-CM

## 2021-04-09 MED ORDER — BENZONATATE 100 MG PO CAPS: 100.0000 mg | ORAL_CAPSULE | Freq: Three times a day (TID) | ORAL | 0 refills | Status: DC | PRN

## 2021-04-09 MED ORDER — AZITHROMYCIN 250 MG PO TABS: ORAL_TABLET | ORAL | 0 refills | Status: DC

## 2021-04-09 NOTE — Telephone Encounter (Signed)
Seen at husband's visit.  URI symptoms over last 9 days are worsening - new productive cough of cloudy sputum, PNdrainage causing sore throat, head > chest congestion. Drainage causes choking sensation. Some diarrhea.  No fevers, nausea, vomiting.  Husband recently sick with chronic bronchitis exacerbation, he tested negative for flu and COVID.   COVID POCT today - negative.   Rx WASP for zpack with indications when to fill to cover sinusitis. Rx tesslon perls 100mg 

## 2021-04-10 NOTE — Progress Notes (Signed)
Chart reviewed, agree above plan ?

## 2021-04-13 DIAGNOSIS — R350 Frequency of micturition: Secondary | ICD-10-CM | POA: Diagnosis not present

## 2021-04-16 ENCOUNTER — Encounter: Payer: Self-pay | Admitting: *Deleted

## 2021-04-20 ENCOUNTER — Other Ambulatory Visit: Payer: Self-pay | Admitting: Family Medicine

## 2021-04-20 ENCOUNTER — Other Ambulatory Visit: Payer: Self-pay | Admitting: Neurology

## 2021-04-20 DIAGNOSIS — E118 Type 2 diabetes mellitus with unspecified complications: Secondary | ICD-10-CM

## 2021-04-21 NOTE — Telephone Encounter (Signed)
ERx 

## 2021-04-26 DIAGNOSIS — M25562 Pain in left knee: Secondary | ICD-10-CM | POA: Diagnosis not present

## 2021-04-26 DIAGNOSIS — M25561 Pain in right knee: Secondary | ICD-10-CM | POA: Diagnosis not present

## 2021-04-26 DIAGNOSIS — M25661 Stiffness of right knee, not elsewhere classified: Secondary | ICD-10-CM | POA: Diagnosis not present

## 2021-04-26 DIAGNOSIS — M6281 Muscle weakness (generalized): Secondary | ICD-10-CM | POA: Diagnosis not present

## 2021-04-26 DIAGNOSIS — M222X1 Patellofemoral disorders, right knee: Secondary | ICD-10-CM | POA: Diagnosis not present

## 2021-04-26 DIAGNOSIS — M222X2 Patellofemoral disorders, left knee: Secondary | ICD-10-CM | POA: Diagnosis not present

## 2021-04-27 ENCOUNTER — Telehealth: Payer: Self-pay | Admitting: Neurology

## 2021-04-27 NOTE — Telephone Encounter (Signed)
She has history of CVA and is no longer a good candidate for triptans.   PA for Ubrelvy 50mg  started on covermymeds (key: [id number]). Pt has coverage through [facility] [phone]). PA Case: [id number] approved through 05/08/2022.  The patient has been provided with this update and will proceed to the pharmacy.

## 2021-04-27 NOTE — Telephone Encounter (Signed)
Pt called, pharmacy told me insurance would not cover Ubrogepant (UBRELVY) 50 MG TABS. Pharmacy recommended two medications with no copay. [employer] sent a fax  2 or 3 wks ago for physician to approve one of the medications. Would like a call from the nurse

## 2021-04-28 DIAGNOSIS — H16223 Keratoconjunctivitis sicca, not specified as Sjogren's, bilateral: Secondary | ICD-10-CM | POA: Diagnosis not present

## 2021-04-28 DIAGNOSIS — H04123 Dry eye syndrome of bilateral lacrimal glands: Secondary | ICD-10-CM | POA: Diagnosis not present

## 2021-04-28 DIAGNOSIS — H2513 Age-related nuclear cataract, bilateral: Secondary | ICD-10-CM | POA: Diagnosis not present

## 2021-04-28 DIAGNOSIS — H524 Presbyopia: Secondary | ICD-10-CM | POA: Diagnosis not present

## 2021-04-28 DIAGNOSIS — H5213 Myopia, bilateral: Secondary | ICD-10-CM | POA: Diagnosis not present

## 2021-04-29 DIAGNOSIS — M25661 Stiffness of right knee, not elsewhere classified: Secondary | ICD-10-CM | POA: Diagnosis not present

## 2021-04-29 DIAGNOSIS — M25561 Pain in right knee: Secondary | ICD-10-CM | POA: Diagnosis not present

## 2021-04-29 DIAGNOSIS — M6281 Muscle weakness (generalized): Secondary | ICD-10-CM | POA: Diagnosis not present

## 2021-04-29 DIAGNOSIS — M222X2 Patellofemoral disorders, left knee: Secondary | ICD-10-CM | POA: Diagnosis not present

## 2021-04-29 DIAGNOSIS — M222X1 Patellofemoral disorders, right knee: Secondary | ICD-10-CM | POA: Diagnosis not present

## 2021-04-29 DIAGNOSIS — M25562 Pain in left knee: Secondary | ICD-10-CM | POA: Diagnosis not present

## 2021-05-06 DIAGNOSIS — M25661 Stiffness of right knee, not elsewhere classified: Secondary | ICD-10-CM | POA: Diagnosis not present

## 2021-05-06 DIAGNOSIS — M25561 Pain in right knee: Secondary | ICD-10-CM | POA: Diagnosis not present

## 2021-05-06 DIAGNOSIS — M222X1 Patellofemoral disorders, right knee: Secondary | ICD-10-CM | POA: Diagnosis not present

## 2021-05-06 DIAGNOSIS — M222X2 Patellofemoral disorders, left knee: Secondary | ICD-10-CM | POA: Diagnosis not present

## 2021-05-06 DIAGNOSIS — M6281 Muscle weakness (generalized): Secondary | ICD-10-CM | POA: Diagnosis not present

## 2021-05-06 DIAGNOSIS — M25562 Pain in left knee: Secondary | ICD-10-CM | POA: Diagnosis not present

## 2021-05-07 DIAGNOSIS — Z1231 Encounter for screening mammogram for malignant neoplasm of breast: Secondary | ICD-10-CM | POA: Diagnosis not present

## 2021-05-07 LAB — HM MAMMOGRAPHY

## 2021-05-11 DIAGNOSIS — M6281 Muscle weakness (generalized): Secondary | ICD-10-CM | POA: Diagnosis not present

## 2021-05-11 DIAGNOSIS — M25562 Pain in left knee: Secondary | ICD-10-CM | POA: Diagnosis not present

## 2021-05-11 DIAGNOSIS — M25661 Stiffness of right knee, not elsewhere classified: Secondary | ICD-10-CM | POA: Diagnosis not present

## 2021-05-11 DIAGNOSIS — M25561 Pain in right knee: Secondary | ICD-10-CM | POA: Diagnosis not present

## 2021-05-11 DIAGNOSIS — M222X2 Patellofemoral disorders, left knee: Secondary | ICD-10-CM | POA: Diagnosis not present

## 2021-05-11 DIAGNOSIS — M222X1 Patellofemoral disorders, right knee: Secondary | ICD-10-CM | POA: Diagnosis not present

## 2021-05-13 ENCOUNTER — Telehealth: Payer: Medicare HMO

## 2021-05-13 DIAGNOSIS — M222X2 Patellofemoral disorders, left knee: Secondary | ICD-10-CM | POA: Diagnosis not present

## 2021-05-13 DIAGNOSIS — M6281 Muscle weakness (generalized): Secondary | ICD-10-CM | POA: Diagnosis not present

## 2021-05-13 DIAGNOSIS — M25661 Stiffness of right knee, not elsewhere classified: Secondary | ICD-10-CM | POA: Diagnosis not present

## 2021-05-13 DIAGNOSIS — M222X1 Patellofemoral disorders, right knee: Secondary | ICD-10-CM | POA: Diagnosis not present

## 2021-05-13 DIAGNOSIS — M25562 Pain in left knee: Secondary | ICD-10-CM | POA: Diagnosis not present

## 2021-05-13 DIAGNOSIS — M25561 Pain in right knee: Secondary | ICD-10-CM | POA: Diagnosis not present

## 2021-05-18 DIAGNOSIS — M25561 Pain in right knee: Secondary | ICD-10-CM | POA: Diagnosis not present

## 2021-05-18 DIAGNOSIS — M222X2 Patellofemoral disorders, left knee: Secondary | ICD-10-CM | POA: Diagnosis not present

## 2021-05-18 DIAGNOSIS — M25661 Stiffness of right knee, not elsewhere classified: Secondary | ICD-10-CM | POA: Diagnosis not present

## 2021-05-18 DIAGNOSIS — M6281 Muscle weakness (generalized): Secondary | ICD-10-CM | POA: Diagnosis not present

## 2021-05-18 DIAGNOSIS — M222X1 Patellofemoral disorders, right knee: Secondary | ICD-10-CM | POA: Diagnosis not present

## 2021-05-18 DIAGNOSIS — M25562 Pain in left knee: Secondary | ICD-10-CM | POA: Diagnosis not present

## 2021-05-20 ENCOUNTER — Encounter: Payer: Self-pay | Admitting: Family Medicine

## 2021-05-20 DIAGNOSIS — M25661 Stiffness of right knee, not elsewhere classified: Secondary | ICD-10-CM | POA: Diagnosis not present

## 2021-05-20 DIAGNOSIS — M25562 Pain in left knee: Secondary | ICD-10-CM | POA: Diagnosis not present

## 2021-05-20 DIAGNOSIS — M222X2 Patellofemoral disorders, left knee: Secondary | ICD-10-CM | POA: Diagnosis not present

## 2021-05-20 DIAGNOSIS — M222X1 Patellofemoral disorders, right knee: Secondary | ICD-10-CM | POA: Diagnosis not present

## 2021-05-20 DIAGNOSIS — M25561 Pain in right knee: Secondary | ICD-10-CM | POA: Diagnosis not present

## 2021-05-20 DIAGNOSIS — M6281 Muscle weakness (generalized): Secondary | ICD-10-CM | POA: Diagnosis not present

## 2021-05-20 DIAGNOSIS — R928 Other abnormal and inconclusive findings on diagnostic imaging of breast: Secondary | ICD-10-CM | POA: Diagnosis not present

## 2021-05-24 DIAGNOSIS — M222X2 Patellofemoral disorders, left knee: Secondary | ICD-10-CM | POA: Diagnosis not present

## 2021-05-24 DIAGNOSIS — M25661 Stiffness of right knee, not elsewhere classified: Secondary | ICD-10-CM | POA: Diagnosis not present

## 2021-05-24 DIAGNOSIS — M25562 Pain in left knee: Secondary | ICD-10-CM | POA: Diagnosis not present

## 2021-05-24 DIAGNOSIS — M6281 Muscle weakness (generalized): Secondary | ICD-10-CM | POA: Diagnosis not present

## 2021-05-24 DIAGNOSIS — M222X1 Patellofemoral disorders, right knee: Secondary | ICD-10-CM | POA: Diagnosis not present

## 2021-05-24 DIAGNOSIS — M25561 Pain in right knee: Secondary | ICD-10-CM | POA: Diagnosis not present

## 2021-05-25 ENCOUNTER — Telehealth: Payer: Self-pay | Admitting: Family Medicine

## 2021-05-25 ENCOUNTER — Encounter: Payer: Self-pay | Admitting: Family Medicine

## 2021-05-25 NOTE — Telephone Encounter (Signed)
Actually just received L diagnostic mammo results - normal  Plz notify pt and update chart.

## 2021-05-25 NOTE — Telephone Encounter (Signed)
Received abnormal screening mammo to L breast from [facility].  Plz ensure pt has been contacted to schedule more detailed mammo/US - when is this scheduled for?

## 2021-05-25 NOTE — Telephone Encounter (Signed)
Lvm asking pt to call back.  Need to relay L breast mammogram result is normal.

## 2021-05-26 NOTE — Telephone Encounter (Signed)
Lvm asking pt to call back.  Need to relay L breast mammogram result is normal.

## 2021-05-27 NOTE — Telephone Encounter (Signed)
Lvm asking pt to call back.  Need to relay L breast mammogram result is normal.  Mailing a letter.

## 2021-06-15 ENCOUNTER — Telehealth: Payer: Medicare HMO

## 2021-07-05 ENCOUNTER — Ambulatory Visit (INDEPENDENT_AMBULATORY_CARE_PROVIDER_SITE_OTHER): Payer: Medicare HMO

## 2021-07-05 DIAGNOSIS — Z9181 History of falling: Secondary | ICD-10-CM

## 2021-07-05 DIAGNOSIS — I6339 Cerebral infarction due to thrombosis of other cerebral artery: Secondary | ICD-10-CM

## 2021-07-05 DIAGNOSIS — E118 Type 2 diabetes mellitus with unspecified complications: Secondary | ICD-10-CM

## 2021-07-05 NOTE — Chronic Care Management (AMB) (Signed)
Chronic Care Management   CCM RN Visit Note  07/05/2021 Name: Dana [name] MRN: [id number] DOB: 1946-02-09  Subjective: Dana [name] is a 76 y.o. year old female who is a primary care patient of [name], MD. The care management team was consulted for assistance with disease management and care coordination needs.    Engaged with patient by telephone for follow up visit in response to provider referral for case management and/or care coordination services.   Consent to Services:  The patient was given information about Chronic Care Management services, agreed to services, and gave verbal consent prior to initiation of services.  Please see initial visit note for detailed documentation.   Patient agreed to services and verbal consent obtained.   Assessment: Review of patient past medical history, allergies, medications, health status, including review of consultants reports, laboratory and other test data, was performed as part of comprehensive evaluation and provision of chronic care management services.   SDOH (Social Determinants of Health) assessments and interventions performed:    CCM Care Plan  Allergies  Allergen Reactions   Amitriptyline Other (See Comments)    Worsened migraine and confusion   Lactose Intolerance (Gi) Nausea Only and Other (See Comments)    Abdominal pain, also   Oxybutynin Other (See Comments)    Urinary retention/hesitancy   Tape Other (See Comments)    "Band-aids make my skin red"   Tramadol Other (See Comments)    Causes confusion and dizziness   Niacin And Related Itching   Septra [Sulfamethoxazole-Trimethoprim] Itching    Outpatient Encounter Medications as of 07/05/2021  Medication Sig Note   acetaminophen (TYLENOL) 325 MG tablet Take 1 tablet (325 mg total) by mouth every 6 (six) hours as needed for mild pain. 10/02/2020: ALL MEDICATIONS MUST BE TAKEN WITH APPLESAUCE   atorvastatin (LIPITOR) 40 MG tablet Take 1 tablet (40 mg total)  by mouth at bedtime.    benzonatate (TESSALON) 100 MG capsule Take 1 capsule (100 mg total) by mouth 3 (three) times daily as needed for cough.    butalbital-acetaminophen-caffeine (FIORICET) 50-325-40 MG tablet TAKE 1 TABLET EVERY 6 HOURS AS NEEDED FOR HEADACHE    clopidogrel (PLAVIX) 75 MG tablet Take 1 tablet (75 mg total) by mouth daily.    co-enzyme Q-10 30 MG capsule Take 100 mg by mouth daily. 10/02/2020: ALL MEDICATIONS MUST BE TAKEN WITH APPLESAUCE   fluticasone (FLONASE) 50 MCG/ACT nasal spray USE 2 SPRAYS IN EACH NOSTRIL EVERY DAY    lactase (LACTAID) 3000 units tablet Take 2 tablets (6,000 Units total) by mouth 3 (three) times daily with meals. (Patient taking differently: Take 6,000 Units by mouth See admin instructions. Take 6,000 units (2 tablets) by mouth up to three times a day WITH DAIRY PRODUCTS) 10/02/2020: ALL MEDICATIONS MUST BE TAKEN WITH APPLESAUCE   methocarbamol (ROBAXIN) 500 MG tablet TAKE 1 TABLET (500 MG TOTAL) BY MOUTH 3 (THREE) TIMES DAILY AS NEEDED FOR MUSCLE SPASMS (SEDATION PRECAUTIONS).    Multiple Vitamin (MULTIVITAMIN) tablet Take 1 tablet by mouth at bedtime. 10/02/2020: ALL MEDICATIONS MUST BE TAKEN WITH APPLESAUCE   omeprazole (PRILOSEC) 20 MG capsule TAKE 1 CAPSULE (20 MG TOTAL) BY MOUTH DAILY. 07/05/2021: Patient states she takes 40 mg daily   promethazine (PHENERGAN) 25 MG tablet Take 0.5-1 tablets (12.5-25 mg total) by mouth 2 (two) times daily as needed for nausea.    THERATEARS PF 0.25 % SOLN Place 2 drops into both eyes in the morning. 11/26/2020: Patient states she takes as  needed   topiramate (TOPAMAX) 25 MG tablet Take 1-2 tablets (25-50 mg total) by mouth See admin instructions. Take 25 mg by mouth in the morning and 50 mg at bedtime    triamcinolone cream (KENALOG) 0.1 % APPLY TO AFFECTED AREA TWICE A DAY    Ubrogepant (UBRELVY) 50 MG TABS Take 1 tab at onset of migraine.  May repeat in 2 hrs, if needed.  Max dose: 2 tabs/day. This is a 30 day  prescription.    vitamin B-12 (CYANOCOBALAMIN) 1000 MCG tablet Take 1 tablet (1,000 mcg total) by mouth every Monday, Wednesday, and Friday. 10/02/2020: ALL MEDICATIONS MUST BE TAKEN WITH APPLESAUCE   Alcohol Swabs (DROPSAFE ALCOHOL PREP) 70 % PADS USE TO CHECK BLOOD SUGAR ONCE DAILY    azithromycin (ZITHROMAX) 250 MG tablet Take two tablets on day one followed by one tablet on days 2-5    Blood Glucose Calibration (TRUE METRIX LEVEL 1) Low SOLN USE TO CHECK METER    Blood Glucose Monitoring Suppl (TRUE METRIX METER) w/Device KIT Check blood sugar once daily    gabapentin (NEURONTIN) 100 MG capsule Take 1-2 capsules (100-200 mg total) by mouth at bedtime as needed. (Patient not taking: Reported on 07/05/2021) 10/02/2020: ALL MEDICATIONS MUST BE TAKEN WITH APPLESAUCE   glucose blood (TRUE METRIX BLOOD GLUCOSE TEST) test strip Check blood sugar once daily    senna (SENOKOT) 8.6 MG tablet Take 1 tablet (8.6 mg total) by mouth daily as needed for constipation.    TRUEplus Lancets 33G MISC Check blood sugar once daily    No facility-administered encounter medications on file as of 07/05/2021.    Patient Active Problem List   Diagnosis Date Noted   Viral URI 04/07/2021   Cerebrovascular accident ([place]) 03/29/2021   Gait abnormality 03/29/2021   Bilateral knee pain 01/27/2021   Cyst of pancreas 01/13/2021   Left kidney mass 01/09/2021   Nausea 12/23/2020   Itching of both hands 10/20/2020   Sensation of cold in lower extremity 10/20/2020   Dysesthesia 09/21/2020   Hemiparesis due to recent stroke ([place]) 08/30/2020   Right sided weakness 08/29/2020   Pain in lateral portion of right ankle 07/20/2020   Plantar fasciitis of right foot 05/19/2020   Small vessel disease, cerebrovascular    Overactive bladder 10/24/2018   Amnesia memory loss 05/19/2017   Overweight (BMI 25.0-29.9) 05/09/2017   Lumbar herniated disc 02/01/2017   Medicare annual wellness visit, subsequent 05/03/2016   Lactose  intolerance in adult    History of kidney stones    Cervical pain (neck) 12/04/2015   Health maintenance examination 04/29/2014   Paresthesia 04/29/2014   Advanced care planning/counseling discussion 04/29/2014   PFD (pelvic floor dysfunction) 04/29/2014   Controlled diabetes mellitus type 2 with complications ([place]) 40/98/1191   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   Hx of diverticulitis of colon 11/07/2012   Allergic rhinitis 11/07/2012   Hyperlipidemia associated with type 2 diabetes mellitus ([place]) 11/07/2012   Migraine 11/07/2012   Hereditary and idiopathic peripheral neuropathy 11/07/2012   LGSIL Pap smear of vaginal cuff 11/07/2012   GERD (gastroesophageal reflux disease) 11/07/2012   Family history of premature coronary heart disease 11/07/2012    Conditions to be addressed/monitored:DMII and falls, stroke  Care Plan : [facility] Plan of care  Updates made by Dannielle Karvonen, RN since 07/05/2021 12:00 AM   Problem: Chronic disease management education and care coordination needs.   Priority: High   Long-Range Goal: Development of plan of care to address chronic  disease mangement and care coordination needs.   Start Date: 07/05/2021  Expected End Date: 10/06/2021  Priority: High  Current Barriers:  Knowledge Deficits related to plan of care for management of stroke, falls, diabetes Type 2  Patient states she is doing well. She reports the nausea has improved with 40 mg of protonix. Patient states she needs to talk with her primary care provider regarding a renewal of her prescription.   Patient reports her bilateral knee pain has improved since having physical therapy. Patient denies any falls since last outreach with RNCM. Per chart review patient last annual wellness visit with 10/06/21 and next follow up visit with neurology is 03/29/22.  RNCM Clinical Goal(s):  Patient will verbalize understanding of plan for management of DMII and stroke, Falls as evidenced by patient self report and/  or notation in chart.  take all medications exactly as prescribed and will call provider for medication related questions as evidenced by patient self report and/ or notation in chart.     attend all scheduled medical appointments:   as evidenced by patient self report and / or notation in chart        continue to work with RN Care Manager and/or Social Worker to address care management and care coordination needs related to DMII and stroke, falls  as evidenced by adherence to CM Team Scheduled appointments     through collaboration with Consulting civil engineer, provider, and care team.   Interventions: 1:1 collaboration with primary care provider regarding development and update of comprehensive plan of care as evidenced by provider attestation and co-signature Inter-disciplinary care team collaboration (see longitudinal plan of care) Evaluation of current treatment plan related to  self management and patient's adherence to plan as established by provider   Diabetes Interventions:  (Status:  New goal.) Long Term Goal Assessed patient's understanding of A1c goal: <7% Reviewed medications with patient and discussed importance of medication adherence Discussed plans with patient for ongoing care management follow up and provided patient with direct contact information for care management team Reviewed scheduled/upcoming provider appointments  Advised patient to either call or send message in [place] regarding need for protonix refill.  Lab Results  Component Value Date   HGBA1C 6.1 (A) 04/07/2021   Falls Interventions:  (Status:  Goal on track:  Yes.) Long Term Goal Reviewed Fall prevention strategies Reviewed medications and discussed potential side effects of medications such as dizziness and frequent urination Assessed for falls since last encounter Advised to continue to use ambulatory device (cane) as advised by your provider.    Stroke  (Status:  Goal on track:  Yes.)  Long Term  Goal Evaluation of current treatment plan related to  stroke ,  self-management and patient's adherence to plan as established by provider. Discussed plans with patient for ongoing care management follow up and provided patient with direct contact information for care management team Reviewed scheduled / upcoming provider appointments.    Patient Goals/Self-Care Activities: Take medications as prescribed   Attend all scheduled provider appointments Call pharmacy for medication refills 3-7 days in advance of running out of medications Call provider office for new concerns or questions  - Continue to utilize cane appropriately with  ambulation - Keep walkways de-cluttered.  -Continue to increase activity as tolerated and as recommended by your provider.  - Continue to take your medications as prescribed. ( Be mindful while taking a blood thinner notify your doctor for bleeding from gums, bruising, blood in urine) -  Continue  to follow up with your doctor as recommended.  Call your doctor to renew Protonix prescription.       Plan:The patient has been provided with contact information for the care management team and has been advised to call with any health related questions or concerns.  The care management team will reach out to the patient again over the next 2 months . Quinn Plowman RN,BSN,CCM RN Case Manager [place]  [phone]

## 2021-07-05 NOTE — Patient Instructions (Signed)
Visit Information  Thank you for taking time to visit with me today. Please don't hesitate to contact me if I can be of assistance to you before our next scheduled telephone appointment.  Following are the goals we discussed today:  Take medications as prescribed   Attend all scheduled provider appointments Call pharmacy for medication refills 3-7 days in advance of running out of medications Call provider office for new concerns or questions  - Continue to utilize cane appropriately with  ambulation - Keep walkways de-cluttered.  -Continue to increase activity as tolerated and as recommended by your provider.  - Continue to take your medications as prescribed. ( Be mindful while taking a blood thinner notify your doctor for bleeding from gums, bruising, blood in urine) -  Continue to follow up with your doctor as recommended.  Call your doctor to renew Protonix prescription.   Our next appointment is by telephone on August 26, 2021 at 11:00 am  Please call the care guide team at [phone] if you need to cancel or reschedule your appointment.   If you are experiencing a Mental Health or [place] or need someone to talk to, please call the Suicide and Crisis Lifeline: 988 call 1-800-273-TALK (toll free, 24 hour hotline)   Patient verbalizes understanding of instructions and care plan provided today and agrees to view in [place]. Active MyChart status confirmed with patient.    Quinn Plowman RN,BSN,CCM RN Case Manager [place]  [phone]

## 2021-07-06 DIAGNOSIS — E118 Type 2 diabetes mellitus with unspecified complications: Secondary | ICD-10-CM | POA: Diagnosis not present

## 2021-07-06 DIAGNOSIS — I6339 Cerebral infarction due to thrombosis of other cerebral artery: Secondary | ICD-10-CM | POA: Diagnosis not present

## 2021-07-27 ENCOUNTER — Telehealth: Payer: Self-pay

## 2021-07-27 DIAGNOSIS — H2513 Age-related nuclear cataract, bilateral: Secondary | ICD-10-CM | POA: Diagnosis not present

## 2021-07-27 DIAGNOSIS — H18413 Arcus senilis, bilateral: Secondary | ICD-10-CM | POA: Diagnosis not present

## 2021-07-27 DIAGNOSIS — H25013 Cortical age-related cataract, bilateral: Secondary | ICD-10-CM | POA: Diagnosis not present

## 2021-07-27 DIAGNOSIS — H2512 Age-related nuclear cataract, left eye: Secondary | ICD-10-CM | POA: Diagnosis not present

## 2021-07-27 DIAGNOSIS — H25043 Posterior subcapsular polar age-related cataract, bilateral: Secondary | ICD-10-CM | POA: Diagnosis not present

## 2021-07-27 NOTE — Telephone Encounter (Addendum)
Received faxed Medical Clearance form from [facility] of [place].  Pt is scheduled on 08/02/21 with Dr. Darleen Crocker for cataract extraction/intraocular lens implantation of L eye followed by R eye.   ? ?Form states pt does NOT need to stop any medication. Topical anesthesia w/IV medication will be used.  Does pt need pre-op visit?   ? ?Placed form in Dr. Synthia Innocent box. ?

## 2021-07-28 ENCOUNTER — Telehealth: Payer: Self-pay | Admitting: *Deleted

## 2021-07-28 NOTE — Telephone Encounter (Signed)
Received surgery clearance form from [facility]. Bilateral cataract extraction by Dr. Darleen Crocker. ? ?Dr. Krista Blue completed and signed form. Allowed patient to hold Plavix '75mg'$  for 5-7 days prior to surgery. ? ?Faxed and confirmed back at [phone]. ?

## 2021-07-29 NOTE — Telephone Encounter (Signed)
Looks like Dana [name] scheduled pt for pre-op eval tomorrow at 2:00. ?

## 2021-07-30 ENCOUNTER — Ambulatory Visit: Payer: Medicare HMO | Admitting: Family Medicine

## 2021-07-30 NOTE — Telephone Encounter (Signed)
Clearance form filled out and in Lisa's box.  ?Patient doesn't need preop eval - plz cancel.  ?

## 2021-07-30 NOTE — Telephone Encounter (Signed)
Faxed form.  C/x today's pre-op visit.  ?

## 2021-08-02 DIAGNOSIS — H2512 Age-related nuclear cataract, left eye: Secondary | ICD-10-CM | POA: Diagnosis not present

## 2021-08-02 DIAGNOSIS — Z961 Presence of intraocular lens: Secondary | ICD-10-CM | POA: Diagnosis not present

## 2021-08-03 DIAGNOSIS — H2511 Age-related nuclear cataract, right eye: Secondary | ICD-10-CM | POA: Diagnosis not present

## 2021-08-13 ENCOUNTER — Other Ambulatory Visit: Payer: Self-pay | Admitting: Family Medicine

## 2021-08-22 ENCOUNTER — Other Ambulatory Visit: Payer: Self-pay

## 2021-08-22 ENCOUNTER — Emergency Department (HOSPITAL_COMMUNITY): Payer: Medicare HMO

## 2021-08-22 ENCOUNTER — Emergency Department (HOSPITAL_COMMUNITY)
Admission: EM | Admit: 2021-08-22 | Discharge: 2021-08-22 | Disposition: A | Discharge status: Home / Self Care | Payer: Medicare HMO | Attending: Emergency Medicine | Admitting: Emergency Medicine

## 2021-08-22 ENCOUNTER — Encounter (HOSPITAL_COMMUNITY): Payer: Self-pay | Admitting: Emergency Medicine

## 2021-08-22 DIAGNOSIS — Z91040 Latex allergy status: Secondary | ICD-10-CM | POA: Insufficient documentation

## 2021-08-22 DIAGNOSIS — Z7902 Long term (current) use of antithrombotics/antiplatelets: Secondary | ICD-10-CM | POA: Diagnosis not present

## 2021-08-22 DIAGNOSIS — R519 Headache, unspecified: Secondary | ICD-10-CM

## 2021-08-22 LAB — COMPREHENSIVE METABOLIC PANEL
ALT: 25 U/L (ref 0–44)
AST: 19 U/L (ref 15–41)
Albumin: 3.5 g/dL (ref 3.5–5.0)
Alkaline Phosphatase: 62 U/L (ref 38–126)
Anion gap: 5 (ref 5–15)
BUN: 11 mg/dL (ref 8–23)
CO2: 21 mmol/L — ABNORMAL LOW (ref 22–32)
Calcium: 8.5 mg/dL — ABNORMAL LOW (ref 8.9–10.3)
Chloride: 118 mmol/L — ABNORMAL HIGH (ref 98–111)
Creatinine, Ser: 0.72 mg/dL (ref 0.44–1.00)
GFR, Estimated: >60 mL/min (ref >60)
Glucose, Bld: 92 mg/dL (ref 70–99)
Potassium: 3.5 mmol/L (ref 3.5–5.1)
Sodium: 144 mmol/L (ref 135–145)
Total Bilirubin: 0.8 mg/dL (ref 0.3–1.2)
Total Protein: 6.2 g/dL — ABNORMAL LOW (ref 6.5–8.1)

## 2021-08-22 LAB — CBC WITH DIFFERENTIAL/PLATELET
Abs Immature Granulocytes: 0.06 10*3/uL (ref 0.00–0.07)
Basophils Absolute: 0 10*3/uL (ref 0.0–0.1)
Basophils Relative: 0 %
Eosinophils Absolute: 0 10*3/uL (ref 0.0–0.5)
Eosinophils Relative: 1 %
HCT: 33.6 % — ABNORMAL LOW (ref 36.0–46.0)
Hemoglobin: 10.9 g/dL — ABNORMAL LOW (ref 12.0–15.0)
Immature Granulocytes: 1 %
Lymphocytes Relative: 34 %
Lymphs Abs: 1.4 10*3/uL (ref 0.7–4.0)
MCH: 30.6 pg (ref 26.0–34.0)
MCHC: 32.4 g/dL (ref 30.0–36.0)
MCV: 94.4 fL (ref 80.0–100.0)
Monocytes Absolute: 0.6 10*3/uL (ref 0.1–1.0)
Monocytes Relative: 14 %
Neutro Abs: 2.1 10*3/uL (ref 1.7–7.7)
Neutrophils Relative %: 50 %
Platelets: 201 10*3/uL (ref 150–400)
RBC: 3.56 MIL/uL — ABNORMAL LOW (ref 3.87–5.11)
RDW: 12.9 % (ref 11.5–15.5)
WBC: 4.2 10*3/uL (ref 4.0–10.5)
nRBC: 0 % (ref 0.0–0.2)

## 2021-08-22 MED ORDER — KETOROLAC TROMETHAMINE 30 MG/ML IJ SOLN
15.0000 mg | Freq: Once | INTRAMUSCULAR | Status: AC
  Administered 2021-08-22: 15 mg via INTRAVENOUS
  Filled 2021-08-22: qty 1

## 2021-08-22 MED ORDER — SODIUM CHLORIDE 0.9 % IV BOLUS
500.0000 mL | Freq: Once | INTRAVENOUS | Status: AC
  Administered 2021-08-22: 500 mL via INTRAVENOUS

## 2021-08-22 NOTE — ED Provider Triage Note (Addendum)
Emergency Medicine Provider Triage Evaluation Note ? ?Dana [name] , a 76 y.o. female  was evaluated in triage.  Pt complains of headache that she noticed when she woke up this morning around 815, when she went to bed last night she was feeling well.  She describes headache as a posterior aching sensation constant without clear aggravating or alleviating factors, pain does not radiate.  She denies similar headache in the past.  She does report a history of migraines but typically is very frontal migraine.  She reports that she had CVA last year, she reports she is currently on blood thinners without missed doses, she reports this does not feel similar to prior CVA. ? ?Review of Systems  ?Positive: Headache ?Negative: Fever, chills, fall, injury, vision changes, dizziness, difficulty speaking, loss of balance, numbness, weakness, tingling, weakness, abdominal pain, chest pain or any additional concerns. ? ?Physical Exam  ?BP 126/77   Pulse 81   Temp 98.1 ?F (36.7 ?C) (Oral)   Resp 14   SpO2 100%  ?Gen:   Awake, no distress   ?Resp:  Normal effort  ?MSK:   Moves extremities without difficulty, no meningeal signs ?Other:  Speech is clear and goal oriented, follows commands ?Major Cranial nerves without deficit, no facial droop ?Normal strength in upper and lower extremities bilaterally including dorsiflexion and plantar flexion, strong and equal grip strength ?Sensation normal to light and sharp touch ?Moves extremities without ataxia, coordination intact ?Normal finger to nose and rapid alternating movements ?Neg romberg, no pronator drift ?Normal gait ?Normal heel-shin and balance ? ? ?Medical Decision Making  ?Headache noticed when she first woke up at 8:15 AM.  Denies any neurologic symptoms, neuro examination within normal limits.  No history of injury, no infectious symptoms.  Given unusual headache over the age of 53 CT scan was ordered, risk versus benefits of CT imaging were discussed with patient and she  stated understanding and wished to proceed. ? ? ?Medically screening exam initiated at 10:43 AM.  Appropriate orders placed.  Dana [name] was informed that the remainder of the evaluation will be completed by another provider, this initial triage assessment does not replace that evaluation, and the importance of remaining in the ED until their evaluation is complete. ? ? ? ?Note: Portions of this report may have been transcribed using voice recognition software. Every effort was made to ensure accuracy; however, inadvertent computerized transcription errors may still be present. ? ?  ?Deliah Boston, PA-C ?08/22/21 1045 ? ?  ?Deliah Boston, PA-C ?08/22/21 1045 ? ?

## 2021-08-22 NOTE — ED Notes (Signed)
Pt off unit to CT

## 2021-08-22 NOTE — ED Provider Notes (Signed)
?[facility] ?Provider Note ? ? ?CSN: [id number] ?Arrival date & time: 08/22/21  1018 ? ?  ? ?History ? ?Chief Complaint  ?Patient presents with  ? Headache  ? ? ?Dana [name] is a 76 y.o. female. ? ?HPI ?Patient presents with headache.  She is here with her husband who assists with the history. ? ?Patient presents due to headache.  Pain is occipital, nonradiating, sore, worse with pressure applied to the area.  Does not recall fall, trauma, obvious precipitant, pain has been present for about 1 day.  No medication taken for relief. ?No change in medication recently and she continues to take her Plavix, necessary after stroke that occurred about 1 year ago.  She is scheduled for cataract surgery tomorrow, has been using eyedrops over the past few days in preparation for this.  No new speech difficulty, facial asymmetry, weakness in her extremities beyond new baseline. ?  ? ?Home Medications ?Prior to Admission medications   ?Medication Sig Start Date End Date Taking? Authorizing Provider  ?acetaminophen (TYLENOL) 325 MG tablet Take 1 tablet (325 mg total) by mouth every 6 (six) hours as needed for mild pain. 09/01/20  Yes Elgergawy, Silver Huguenin, MD  ?atorvastatin (LIPITOR) 40 MG tablet TAKE 1 TABLET AT BEDTIME ?Patient taking differently: Take 40 mg by mouth at bedtime. 08/13/21  Yes [name], MD  ?butalbital-acetaminophen-caffeine (FIORICET) 50-325-40 MG tablet TAKE 1 TABLET EVERY 6 HOURS AS NEEDED FOR HEADACHE ?Patient taking differently: Take 1 tablet by mouth every 6 (six) hours as needed for migraine or headache. 04/21/21  Yes Marcial Pacas, MD  ?clopidogrel (PLAVIX) 75 MG tablet Take 1 tablet (75 mg total) by mouth daily. 11/02/20  Yes Suzzanne Cloud, NP  ?co-enzyme Q-10 30 MG capsule Take 100 mg by mouth daily.   Yes [provider]  ?DUREZOL 0.05 % EMUL Place 1 drop into the left eye 3 (three) times daily. 08/03/21  Yes [provider]  ?fluticasone  (FLONASE) 50 MCG/ACT nasal spray USE 2 SPRAYS IN EACH NOSTRIL EVERY DAY ?Patient taking differently: Place 2 sprays into both nostrils at bedtime. 03/18/21  Yes [name], MD  ?ketorolac (ACULAR) 0.5 % ophthalmic solution Place 1 drop into the right eye 4 (four) times daily. 08/03/21  Yes [provider]  ?lactase (LACTAID) 3000 units tablet Take 2 tablets (6,000 Units total) by mouth 3 (three) times daily with meals. ?Patient taking differently: Take 6,000 Units by mouth See admin instructions. Take 6,000 units (2 tablets) by mouth up to three times a day WITH DAIRY PRODUCTS 09/01/20  Yes Elgergawy, Silver Huguenin, MD  ?methocarbamol (ROBAXIN) 500 MG tablet TAKE 1 TABLET (500 MG TOTAL) BY MOUTH 3 (THREE) TIMES DAILY AS NEEDED FOR MUSCLE SPASMS (SEDATION PRECAUTIONS). 04/21/21  Yes [name], MD  ?moxifloxacin (VIGAMOX) 0.5 % ophthalmic solution Place 1 drop into the right eye 4 (four) times daily. 08/03/21  Yes [provider]  ?Multiple Vitamin (MULTIVITAMIN) tablet Take 1 tablet by mouth at bedtime.   Yes [provider]  ?omeprazole (PRILOSEC) 20 MG capsule TAKE 1 CAPSULE (20 MG TOTAL) BY MOUTH DAILY. 03/18/21  Yes [name], MD  ?PROLENSA 0.07 % SOLN Place 1 drop into the left eye daily. 08/03/21  Yes [provider]  ?promethazine (PHENERGAN) 25 MG tablet Take 0.5-1 tablets (12.5-25 mg total) by mouth 2 (two) times daily as needed for nausea. 12/26/20  Yes [name], MD  ?[facility] PF 0.25 % SOLN Place 2 drops  into both eyes daily as needed (dry eyes).   Yes [provider]  ?topiramate (TOPAMAX) 25 MG tablet Take 1-2 tablets (25-50 mg total) by mouth See admin instructions. Take 25 mg by mouth in the morning and 50 mg at bedtime 03/29/21  Yes Marcial Pacas, MD  ?triamcinolone cream (KENALOG) 0.1 % APPLY TO AFFECTED AREA TWICE A DAY ?Patient taking differently: Apply 1 application. topically daily as needed (rash). APPLY TO AFFECTED AREA TWICE A  DAY 03/19/21  Yes [name], MD  ?Ubrogepant (UBRELVY) 50 MG TABS Take 1 tab at onset of migraine.  May repeat in 2 hrs, if needed.  Max dose: 2 tabs/day. This is a 30 day prescription. 03/29/21  Yes Marcial Pacas, MD  ?vitamin B-12 (CYANOCOBALAMIN) 1000 MCG tablet Take 1 tablet (1,000 mcg total) by mouth every Monday, Wednesday, and Friday. 09/26/18  Yes [name], MD  ?Alcohol Swabs (DROPSAFE ALCOHOL PREP) 70 % PADS USE TO CHECK BLOOD SUGAR ONCE DAILY 04/21/21   [name], MD  ?benzonatate (TESSALON) 100 MG capsule Take 1 capsule (100 mg total) by mouth 3 (three) times daily as needed for cough. ?Patient not taking: Reported on 08/22/2021 04/09/21   [name], MD  ?Blood Glucose Calibration (TRUE METRIX LEVEL 1) Low SOLN USE TO CHECK METER 04/21/21   [name], MD  ?Blood Glucose Monitoring Suppl (TRUE METRIX METER) w/Device KIT Check blood sugar once daily 09/29/20   [name], MD  ?glucose blood (TRUE METRIX BLOOD GLUCOSE TEST) test strip Check blood sugar once daily 09/29/20   [name], MD  ?TRUEplus Lancets 33G MISC Check blood sugar once daily 09/29/20   [name], MD  ?   ? ?Allergies    ?Amitriptyline, Lactose intolerance (gi), Oxybutynin, Tramadol, Latex, Niacin and related, and Septra [sulfamethoxazole-trimethoprim]   ? ?Review of Systems   ?Review of Systems  ?Constitutional:   ?     Per HPI, otherwise negative  ?HENT:    ?     Per HPI, otherwise negative  ?Respiratory:    ?     Per HPI, otherwise negative  ?Cardiovascular:   ?     Per HPI, otherwise negative  ?Gastrointestinal:  Negative for vomiting.  ?Endocrine:  ?     Negative aside from HPI  ?Genitourinary:   ?     Neg aside from HPI   ?Musculoskeletal:   ?     Per HPI, otherwise negative  ?Skin: Negative.   ?Neurological:  Positive for weakness (no change from stroke deficits) and headaches. Negative for tremors, seizures, syncope, speech difficulty, light-headedness and numbness.  ? ?Physical  Exam ?Updated Vital Signs ?BP 128/65   Pulse 61   Temp 98.1 ?F (36.7 ?C) (Oral)   Resp 14   SpO2 100%  ?Physical Exam ?Vitals and nursing note reviewed.  ?Constitutional:   ?   General: She is not in acute distress. ?   Appearance: She is well-developed.  ?HENT:  ?   Head: Normocephalic and atraumatic.  ?Eyes:  ?   Conjunctiva/sclera: Conjunctivae normal.  ?Cardiovascular:  ?   Rate and Rhythm: Normal rate and regular rhythm.  ?Pulmonary:  ?   Effort: Pulmonary effort is normal. No respiratory distress.  ?   Breath sounds: Normal breath sounds. No stridor.  ?Abdominal:  ?   General: There is no distension.  ?Skin: ?   General: Skin is warm and dry.  ?Neurological:  ?   Mental Status: She is alert and oriented to person, place,  and time.  ?   Cranial Nerves: No cranial nerve deficit or dysarthria.  ?   Comments: R UE / LE 4+/5, no change from new baseline, per patient.  ?Psychiatric:     ?   Mood and Affect: Mood normal.  ? ? ?ED Results / Procedures / Treatments   ?Labs ?(all labs ordered are listed, but only abnormal results are displayed) ?Labs Reviewed  ?COMPREHENSIVE METABOLIC PANEL - Abnormal; Notable for the following components:  ?    Result Value  ? Chloride 118 (*)   ? CO2 21 (*)   ? Calcium 8.5 (*)   ? Total Protein 6.2 (*)   ? All other components within normal limits  ?CBC WITH DIFFERENTIAL/PLATELET - Abnormal; Notable for the following components:  ? RBC 3.56 (*)   ? Hemoglobin 10.9 (*)   ? HCT 33.6 (*)   ? All other components within normal limits  ? ? ? ? ?Radiology ?CT HEAD WO CONTRAST ? ?Result Date: 08/22/2021 ?CLINICAL DATA:  Severe headache. EXAM: CT HEAD WITHOUT CONTRAST TECHNIQUE: Contiguous axial images were obtained from the base of the skull through the vertex without intravenous contrast. RADIATION DOSE REDUCTION: This exam was performed according to the departmental dose-optimization program which includes automated exposure control, adjustment of the mA and/or kV according to patient  size and/or use of iterative reconstruction technique. COMPARISON:  10/02/2020 FINDINGS: Brain: No evidence of intracranial hemorrhage, acute infarction, hydrocephalus, extra-axial collection, or mass lesion/ma

## 2021-08-22 NOTE — ED Triage Notes (Signed)
C/o L posterior headache since this morning.  Pain 8/10 and now 4/10 after Tylenol.  Denies numbness, weakness, dizziness.  No arm drift.  Hx of stroke. ?

## 2021-08-22 NOTE — Discharge Instructions (Signed)
As discussed, your evaluation today has been largely reassuring.  But, it is important that you monitor your condition carefully, and do not hesitate to return to the ED if you develop new, or concerning changes in your condition. ? ?Otherwise, please follow-up with your physician for appropriate ongoing care. ? ?

## 2021-08-23 DIAGNOSIS — H2511 Age-related nuclear cataract, right eye: Secondary | ICD-10-CM | POA: Diagnosis not present

## 2021-08-23 DIAGNOSIS — H2512 Age-related nuclear cataract, left eye: Secondary | ICD-10-CM | POA: Diagnosis not present

## 2021-08-23 DIAGNOSIS — Z961 Presence of intraocular lens: Secondary | ICD-10-CM | POA: Diagnosis not present

## 2021-08-26 ENCOUNTER — Ambulatory Visit (INDEPENDENT_AMBULATORY_CARE_PROVIDER_SITE_OTHER): Payer: Medicare HMO

## 2021-08-26 DIAGNOSIS — I6339 Cerebral infarction due to thrombosis of other cerebral artery: Secondary | ICD-10-CM

## 2021-08-26 DIAGNOSIS — E118 Type 2 diabetes mellitus with unspecified complications: Secondary | ICD-10-CM

## 2021-08-26 DIAGNOSIS — Z9181 History of falling: Secondary | ICD-10-CM

## 2021-08-26 NOTE — Patient Instructions (Signed)
Visit Information ? ?Thank you for taking time to visit with me today. Please don't hesitate to contact me if I can be of assistance to you before our next scheduled telephone appointment. ? ?Following are the goals we discussed today:  ?Continue to take medications as prescribed   ?Attend all scheduled provider appointments ?Call pharmacy for medication refills 3-7 days in advance of running out of medications ?Call provider office for new concerns or questions  ?- Continue to utilize cane appropriately with  ambulation ? ?Our next appointment is by telephone on 11/11/21 at 11:00 am ? ?Please call the care guide team at [phone] if you need to cancel or reschedule your appointment.  ? ?If you are experiencing a Mental Health or [place] or need someone to talk to, please call the Suicide and Crisis Lifeline: 988 ?call 1-800-273-TALK (toll free, 24 hour hotline)  ? ?Patient verbalizes understanding of instructions and care plan provided today and agrees to view in [place]. Active MyChart status confirmed with patient.   ? ?Quinn Plowman RN,BSN,CCM ?RN Case Manager ?[place]  ?[phone] ? ?

## 2021-08-26 NOTE — Chronic Care Management (AMB) (Signed)
?Chronic Care Management  ? ?CCM RN Visit Note ? ?08/26/2021 ?Name: Dana [name] MRN: [id number] DOB: February 25, 1946 ? ?Subjective: ?Dana [name] is a 76 y.o. year old female who is a primary care patient of [name], MD. The care management team was consulted for assistance with disease management and care coordination needs.   ? ?Engaged with patient by telephone for follow up visit in response to provider referral for case management and/or care coordination services.  ? ?Consent to Services:  ?The patient was given information about Chronic Care Management services, agreed to services, and gave verbal consent prior to initiation of services.  Please see initial visit note for detailed documentation.  ? ?Patient agreed to services and verbal consent obtained.  ? ?Assessment: Review of patient past medical history, allergies, medications, health status, including review of consultants reports, laboratory and other test data, was performed as part of comprehensive evaluation and provision of chronic care management services.  ? ?SDOH (Social Determinants of Health) assessments and interventions performed:   ? ?CCM Care Plan ? ?Allergies  ?Allergen Reactions  ? Amitriptyline Other (See Comments)  ?  Worsened migraine and confusion  ? Lactose Intolerance (Gi) Nausea Only and Other (See Comments)  ?  Abdominal pain, also  ? Oxybutynin Other (See Comments)  ?  Urinary retention/hesitancy  ? Tramadol Other (See Comments)  ?  Causes confusion and dizziness  ? Latex Rash  ? Niacin And Related Itching  ? Septra [Sulfamethoxazole-Trimethoprim] Itching  ? ? ?Outpatient Encounter Medications as of 08/26/2021  ?Medication Sig Note  ? acetaminophen (TYLENOL) 325 MG tablet Take 1 tablet (325 mg total) by mouth every 6 (six) hours as needed for mild pain. 10/02/2020: ALL MEDICATIONS MUST BE TAKEN WITH APPLESAUCE  ? Alcohol Swabs (DROPSAFE ALCOHOL PREP) 70 % PADS USE TO CHECK BLOOD SUGAR ONCE DAILY   ? atorvastatin (LIPITOR) 40  MG tablet TAKE 1 TABLET AT BEDTIME (Patient taking differently: Take 40 mg by mouth at bedtime.)   ? benzonatate (TESSALON) 100 MG capsule Take 1 capsule (100 mg total) by mouth 3 (three) times daily as needed for cough. (Patient not taking: Reported on 08/22/2021)   ? Blood Glucose Calibration (TRUE METRIX LEVEL 1) Low SOLN USE TO CHECK METER   ? Blood Glucose Monitoring Suppl (TRUE METRIX METER) w/Device KIT Check blood sugar once daily   ? butalbital-acetaminophen-caffeine (FIORICET) 50-325-40 MG tablet TAKE 1 TABLET EVERY 6 HOURS AS NEEDED FOR HEADACHE (Patient taking differently: Take 1 tablet by mouth every 6 (six) hours as needed for migraine or headache.)   ? clopidogrel (PLAVIX) 75 MG tablet Take 1 tablet (75 mg total) by mouth daily.   ? co-enzyme Q-10 30 MG capsule Take 100 mg by mouth daily. 10/02/2020: ALL MEDICATIONS MUST BE TAKEN WITH APPLESAUCE  ? DUREZOL 0.05 % EMUL Place 1 drop into the left eye 3 (three) times daily.   ? fluticasone (FLONASE) 50 MCG/ACT nasal spray USE 2 SPRAYS IN EACH NOSTRIL EVERY DAY (Patient taking differently: Place 2 sprays into both nostrils at bedtime.)   ? glucose blood (TRUE METRIX BLOOD GLUCOSE TEST) test strip Check blood sugar once daily   ? ketorolac (ACULAR) 0.5 % ophthalmic solution Place 1 drop into the right eye 4 (four) times daily.   ? lactase (LACTAID) 3000 units tablet Take 2 tablets (6,000 Units total) by mouth 3 (three) times daily with meals. (Patient taking differently: Take 6,000 Units by mouth See admin instructions. Take 6,000 units (2 tablets) by  mouth up to three times a day WITH DAIRY PRODUCTS) 10/02/2020: ALL MEDICATIONS MUST BE TAKEN WITH APPLESAUCE  ? methocarbamol (ROBAXIN) 500 MG tablet TAKE 1 TABLET (500 MG TOTAL) BY MOUTH 3 (THREE) TIMES DAILY AS NEEDED FOR MUSCLE SPASMS (SEDATION PRECAUTIONS).   ? moxifloxacin (VIGAMOX) 0.5 % ophthalmic solution Place 1 drop into the right eye 4 (four) times daily.   ? Multiple Vitamin (MULTIVITAMIN) tablet  Take 1 tablet by mouth at bedtime. 10/02/2020: ALL MEDICATIONS MUST BE TAKEN WITH APPLESAUCE  ? omeprazole (PRILOSEC) 20 MG capsule TAKE 1 CAPSULE (20 MG TOTAL) BY MOUTH DAILY. 07/05/2021: Patient states she takes 40 mg daily  ? PROLENSA 0.07 % SOLN Place 1 drop into the left eye daily.   ? promethazine (PHENERGAN) 25 MG tablet Take 0.5-1 tablets (12.5-25 mg total) by mouth 2 (two) times daily as needed for nausea.   ? THERATEARS PF 0.25 % SOLN Place 2 drops into both eyes daily as needed (dry eyes).   ? topiramate (TOPAMAX) 25 MG tablet Take 1-2 tablets (25-50 mg total) by mouth See admin instructions. Take 25 mg by mouth in the morning and 50 mg at bedtime   ? triamcinolone cream (KENALOG) 0.1 % APPLY TO AFFECTED AREA TWICE A DAY (Patient taking differently: Apply 1 application. topically daily as needed (rash). APPLY TO AFFECTED AREA TWICE A DAY)   ? TRUEplus Lancets 33G MISC Check blood sugar once daily   ? Ubrogepant (UBRELVY) 50 MG TABS Take 1 tab at onset of migraine.  May repeat in 2 hrs, if needed.  Max dose: 2 tabs/day. This is a 30 day prescription. 08/22/2021: Has not had to use this medication, keeps it on hand.   ? vitamin B-12 (CYANOCOBALAMIN) 1000 MCG tablet Take 1 tablet (1,000 mcg total) by mouth every Monday, Wednesday, and Friday. 10/02/2020: ALL MEDICATIONS MUST BE TAKEN WITH APPLESAUCE  ? ?No facility-administered encounter medications on file as of 08/26/2021.  ? ? ?Patient Active Problem List  ? Diagnosis Date Noted  ? Viral URI 04/07/2021  ? Cerebrovascular accident [facility]) 03/29/2021  ? Gait abnormality 03/29/2021  ? Bilateral knee pain 01/27/2021  ? Cyst of pancreas 01/13/2021  ? Left kidney mass 01/09/2021  ? Nausea 12/23/2020  ? Itching of both hands 10/20/2020  ? Sensation of cold in lower extremity 10/20/2020  ? Dysesthesia 09/21/2020  ? Hemiparesis due to recent stroke ([place]) 08/30/2020  ? Right sided weakness 08/29/2020  ? Pain in lateral portion of right ankle 07/20/2020  ? Plantar  fasciitis of right foot 05/19/2020  ? Small vessel disease, cerebrovascular   ? Overactive bladder 10/24/2018  ? Amnesia memory loss 05/19/2017  ? Overweight (BMI 25.0-29.9) 05/09/2017  ? Lumbar herniated disc 02/01/2017  ? Medicare annual wellness visit, subsequent 05/03/2016  ? Lactose intolerance in adult   ? History of kidney stones   ? Cervical pain (neck) 12/04/2015  ? Health maintenance examination 04/29/2014  ? Paresthesia 04/29/2014  ? Advanced care planning/counseling discussion 04/29/2014  ? PFD (pelvic floor dysfunction) 04/29/2014  ? Controlled diabetes mellitus type 2 with complications ([place]) 55/73/2202  ? 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 Hx of diverticulitis of colon 11/07/2012  ? Allergic rhinitis 11/07/2012  ? Hyperlipidemia associated with type 2 diabetes mellitus ([place]) 11/07/2012  ? Migraine 11/07/2012  ? Hereditary and idiopathic peripheral neuropathy 11/07/2012  ? LGSIL Pap smear of vaginal cuff 11/07/2012  ? GERD (gastroesophageal reflux disease) 11/07/2012  ? Family history of premature coronary heart disease 11/07/2012  ? ? ?  Conditions to be addressed/monitored:DMII and Falls, stroke ? ?Care Plan : RNCM Plan of care  ?Updates made by Dannielle Karvonen, RN since 08/26/2021 12:00 AM  ?  ? ?Problem: Chronic disease management education and care coordination needs.   ?Priority: High  ?  ? ?Long-Range Goal: Development of plan of care to address chronic disease mangement and care coordination needs.   ?Start Date: 07/05/2021  ?Expected End Date: 12/06/2021  ?Priority: High  ?Note:   ?Current Barriers:  ?Knowledge Deficits related to plan of care for management of stroke, falls, diabetes Type 2  ?Patient reports having an ED visit on 08/22/21 due to severe headache. Patient states the medication she was prescribed has helped to allieviate the headache. She reports having some soreness to touch at the site.   Patient reports having cataract surgery on 08/23/21 and states she is recovering well.   Patient reports her physical therapy has been discontinued regarding her knees.  Reports knee pain is better.  Per chart review patient last annual wellness visit with 10/06/21 and next follow up visit with neurology

## 2021-09-05 DIAGNOSIS — E1159 Type 2 diabetes mellitus with other circulatory complications: Secondary | ICD-10-CM

## 2021-10-06 ENCOUNTER — Telehealth: Payer: Self-pay | Admitting: Family Medicine

## 2021-10-06 NOTE — Telephone Encounter (Signed)
Left message for patient to call back and schedule Medicare Annual Wellness Visit (AWV) either virtually or phone   Last AWV ; 10/06/20 please schedule at anytime with health coach    I left my direct # [phone]

## 2021-10-13 ENCOUNTER — Ambulatory Visit (INDEPENDENT_AMBULATORY_CARE_PROVIDER_SITE_OTHER): Payer: Medicare HMO

## 2021-10-13 ENCOUNTER — Other Ambulatory Visit: Payer: Self-pay | Admitting: Family Medicine

## 2021-10-13 ENCOUNTER — Other Ambulatory Visit: Payer: Self-pay | Admitting: Neurology

## 2021-10-13 VITALS — Ht 63.0 in | Wt 163.0 lb

## 2021-10-13 DIAGNOSIS — Z0000 Encounter for general adult medical examination without abnormal findings: Secondary | ICD-10-CM | POA: Diagnosis not present

## 2021-10-13 DIAGNOSIS — E118 Type 2 diabetes mellitus with unspecified complications: Secondary | ICD-10-CM

## 2021-10-13 NOTE — Patient Instructions (Addendum)
Dana [name] , Thank you for taking time to come for your Medicare Wellness Visit. I appreciate your ongoing commitment to your health goals. Please review the following plan we discussed and let me know if I can assist you in the future.   These are the goals we discussed:  Goals       Increase physical activity (pt-stated)      I would like to get back to gym, also find anew house.      Patient Stated      09/20/2019, I will continue to walk 1,000-2,000 steps everyday.        This is a list of the screening recommended for you and due dates:  Health Maintenance  Topic Date Due   Eye exam for diabetics  04/13/2021   Urine Protein Check  09/21/2021   Hemoglobin A1C  10/05/2021   Tetanus Vaccine  10/14/2022*   Flu Shot  12/07/2021   Complete foot exam   04/07/2022   Mammogram  05/20/2022   Cologuard (Stool DNA test)  05/27/2022   Pneumonia Vaccine  Completed   DEXA scan (bone density measurement)  Completed   COVID-19 Vaccine  Completed   Hepatitis C Screening: USPSTF Recommendation to screen - Ages 97-79 yo.  Completed   Zoster (Shingles) Vaccine  Completed   HPV Vaccine  Aged Out  *Topic was postponed. The date shown is not the original due date.    Advanced directives: Yes Copy on file  Conditions/risks identified: None  Next appointment: Follow up in one year for your annual wellness visit    Preventive Care 65 Years and Older, Female Preventive care refers to lifestyle choices and visits with your health care provider that can promote health and wellness. What does preventive care include? A yearly physical exam. This is also called an annual well check. Dental exams once or twice a year. Routine eye exams. Ask your health care provider how often you should have your eyes checked. Personal lifestyle choices, including: Daily care of your teeth and gums. Regular physical activity. Eating a healthy diet. Avoiding tobacco and drug use. Limiting alcohol  use. Practicing safe sex. Taking low-dose aspirin every day. Taking vitamin and mineral supplements as recommended by your health care provider. What happens during an annual well check? The services and screenings done by your health care provider during your annual well check will depend on your age, overall health, lifestyle risk factors, and family history of disease. Counseling  Your health care provider may ask you questions about your: Alcohol use. Tobacco use. Drug use. Emotional well-being. Home and relationship well-being. Sexual activity. Eating habits. History of falls. Memory and ability to understand (cognition). Work and work Statistician. Reproductive health. Screening  You may have the following tests or measurements: Height, weight, and BMI. Blood pressure. Lipid and cholesterol levels. These may be checked every 5 years, or more frequently if you are over 22 years old. Skin check. Lung cancer screening. You may have this screening every year starting at age 6 if you have a 30-pack-year history of smoking and currently smoke or have quit within the past 15 years. Fecal occult blood test (FOBT) of the stool. You may have this test every year starting at age 20. Flexible sigmoidoscopy or colonoscopy. You may have a sigmoidoscopy every 5 years or a colonoscopy every 10 years starting at age 6. Hepatitis C blood test. Hepatitis B blood test. Sexually transmitted disease (STD) testing. Diabetes screening. This is done by checking  your blood sugar (glucose) after you have not eaten for a while (fasting). You may have this done every 1-3 years. Bone density scan. This is done to screen for osteoporosis. You may have this done starting at age 88. Mammogram. This may be done every 1-2 years. Talk to your health care provider about how often you should have regular mammograms. Talk with your health care provider about your test results, treatment options, and if necessary,  the need for more tests. Vaccines  Your health care provider may recommend certain vaccines, such as: Influenza vaccine. This is recommended every year. Tetanus, diphtheria, and acellular pertussis (Tdap, Td) vaccine. You may need a Td booster every 10 years. Zoster vaccine. You may need this after age 47. Pneumococcal 13-valent conjugate (PCV13) vaccine. One dose is recommended after age 18. Pneumococcal polysaccharide (PPSV23) vaccine. One dose is recommended after age 65. Talk to your health care provider about which screenings and vaccines you need and how often you need them. This information is not intended to replace advice given to you by your health care provider. Make sure you discuss any questions you have with your health care provider. Document Released: 05/22/2015 Document Revised: 01/13/2016 Document Reviewed: 02/24/2015 Elsevier Interactive Patient Education  2017 [employer] Prevention in the Home Falls can cause injuries. They can happen to people of all ages. There are many things you can do to make your home safe and to help prevent falls. What can I do on the outside of my home? Regularly fix the edges of walkways and driveways and fix any cracks. Remove anything that might make you trip as you walk through a door, such as a raised step or threshold. Trim any bushes or trees on the path to your home. Use bright outdoor lighting. Clear any walking paths of anything that might make someone trip, such as rocks or tools. Regularly check to see if handrails are loose or broken. Make sure that both sides of any steps have handrails. Any raised decks and porches should have guardrails on the edges. Have any leaves, snow, or ice cleared regularly. Use sand or salt on walking paths during winter. Clean up any spills in your garage right away. This includes oil or grease spills. What can I do in the bathroom? Use night lights. Install grab bars by the toilet and in the  tub and shower. Do not use towel bars as grab bars. Use non-skid mats or decals in the tub or shower. If you need to sit down in the shower, use a plastic, non-slip stool. Keep the floor dry. Clean up any water that spills on the floor as soon as it happens. Remove soap buildup in the tub or shower regularly. Attach bath mats securely with double-sided non-slip rug tape. Do not have throw rugs and other things on the floor that can make you trip. What can I do in the bedroom? Use night lights. Make sure that you have a light by your bed that is easy to reach. Do not use any sheets or blankets that are too big for your bed. They should not hang down onto the floor. Have a firm chair that has side arms. You can use this for support while you get dressed. Do not have throw rugs and other things on the floor that can make you trip. What can I do in the kitchen? Clean up any spills right away. Avoid walking on wet floors. Keep items that you use a lot  in easy-to-reach places. If you need to reach something above you, use a strong step stool that has a grab bar. Keep electrical cords out of the way. Do not use floor polish or wax that makes floors slippery. If you must use wax, use non-skid floor wax. Do not have throw rugs and other things on the floor that can make you trip. What can I do with my stairs? Do not leave any items on the stairs. Make sure that there are handrails on both sides of the stairs and use them. Fix handrails that are broken or loose. Make sure that handrails are as long as the stairways. Check any carpeting to make sure that it is firmly attached to the stairs. Fix any carpet that is loose or worn. Avoid having throw rugs at the top or bottom of the stairs. If you do have throw rugs, attach them to the floor with carpet tape. Make sure that you have a light switch at the top of the stairs and the bottom of the stairs. If you do not have them, ask someone to add them for  you. What else can I do to help prevent falls? Wear shoes that: Do not have high heels. Have rubber bottoms. Are comfortable and fit you well. Are closed at the toe. Do not wear sandals. If you use a stepladder: Make sure that it is fully opened. Do not climb a closed stepladder. Make sure that both sides of the stepladder are locked into place. Ask someone to hold it for you, if possible. Clearly mark and make sure that you can see: Any grab bars or handrails. First and last steps. Where the edge of each step is. Use tools that help you move around (mobility aids) if they are needed. These include: Canes. Walkers. Scooters. Crutches. Turn on the lights when you go into a dark area. Replace any light bulbs as soon as they burn out. Set up your furniture so you have a clear path. Avoid moving your furniture around. If any of your floors are uneven, fix them. If there are any pets around you, be aware of where they are. Review your medicines with your doctor. Some medicines can make you feel dizzy. This can increase your chance of falling. Ask your doctor what other things that you can do to help prevent falls. This information is not intended to replace advice given to you by your health care provider. Make sure you discuss any questions you have with your health care provider. Document Released: 02/19/2009 Document Revised: 10/01/2015 Document Reviewed: 05/30/2014 Elsevier Interactive Patient Education  2017 [employer].

## 2021-10-13 NOTE — Telephone Encounter (Signed)
Rx refilled.

## 2021-10-13 NOTE — Progress Notes (Signed)
Subjective:   Dana [name] is a 76 y.o. female who presents for Medicare Annual (Subsequent) preventive examination.  Review of Systems    Virtual Visit via Telephone Note  I connected with  Dana [name] on 10/13/21 at  9:00 AM EDT by telephone and verified that I am speaking with the correct person using two identifiers.  Location: Patient: Home Provider: Office Persons participating in the virtual visit: patient/Nurse Health Advisor   I discussed the limitations, risks, security and privacy concerns of performing an evaluation and management service by telephone and the availability of in person appointments. The patient expressed understanding and agreed to proceed.  Interactive audio and video telecommunications were attempted between this nurse and patient, however failed, due to patient having technical difficulties OR patient did not have access to video capability.  We continued and completed visit with audio only.  Some vital signs may be absent or patient reported.   Criselda Peaches, LPN  Cardiac Risk Factors include: advanced age (>62mn, >>26women)     Objective:    Today's Vitals   10/13/21 0915  Weight: 163 lb (73.9 kg)  Height: _0  (1.6 m)   Body mass index is 28.87 kg/m.     10/13/2021    9:26 AM 11/26/2020    3:37 PM 10/06/2020    4:18 PM 09/16/2020    1:22 PM 08/29/2020   10:19 AM 08/29/2020    5:46 AM 09/20/2019   10:29 AM  Advanced Directives  Does Patient Have a Medical Advance Directive? _1  No;Yes Yes  Type of AParamedicof [place] will [place] Living will;Healthcare Power of Attorney Living will;Healthcare Power of Attorney Living will;Healthcare Power of Attorney Living will;Healthcare Power of [place]  Does patient want to make changes to medical advance directive? No - Patient declined No - Patient declined No - Patient  declined No - Patient declined No - Patient declined No - Patient declined   Copy of [place] Chart? Yes - validated most recent copy scanned in chart (See row information)  No - copy requested No - copy requested No - copy requested No - copy requested Yes - validated most recent copy scanned in chart (See row information)    Current Medications (verified) Outpatient Encounter Medications as of 10/13/2021  Medication Sig   acetaminophen (TYLENOL) 325 MG tablet Take 1 tablet (325 mg total) by mouth every 6 (six) hours as needed for mild pain.   Alcohol Swabs (DROPSAFE ALCOHOL PREP) 70 % PADS USE TO CHECK BLOOD SUGAR ONCE DAILY   atorvastatin (LIPITOR) 40 MG tablet TAKE 1 TABLET AT BEDTIME (Patient taking differently: Take 40 mg by mouth at bedtime.)   benzonatate (TESSALON) 100 MG capsule Take 1 capsule (100 mg total) by mouth 3 (three) times daily as needed for cough. (Patient not taking: Reported on 08/22/2021)   Blood Glucose Calibration (TRUE METRIX LEVEL 1) Low SOLN USE TO CHECK METER   Blood Glucose Monitoring Suppl (TRUE METRIX METER) w/Device KIT Check blood sugar once daily   butalbital-acetaminophen-caffeine (FIORICET) 50-325-40 MG tablet TAKE 1 TABLET EVERY 6 HOURS AS NEEDED FOR HEADACHE (Patient taking differently: Take 1 tablet by mouth every 6 (six) hours as needed for migraine or headache.)   clopidogrel (PLAVIX) 75 MG tablet Take 1 tablet (75 mg total) by mouth daily.   co-enzyme Q-10 30 MG capsule Take 100 mg by  mouth daily.   DUREZOL 0.05 % EMUL Place 1 drop into the left eye 3 (three) times daily.   fluticasone (FLONASE) 50 MCG/ACT nasal spray USE 2 SPRAYS IN EACH NOSTRIL EVERY DAY (Patient taking differently: Place 2 sprays into both nostrils at bedtime.)   glucose blood (TRUE METRIX BLOOD GLUCOSE TEST) test strip Check blood sugar once daily   ketorolac (ACULAR) 0.5 % ophthalmic solution Place 1 drop into the right eye 4 (four) times daily.   lactase  (LACTAID) 3000 units tablet Take 2 tablets (6,000 Units total) by mouth 3 (three) times daily with meals. (Patient taking differently: Take 6,000 Units by mouth See admin instructions. Take 6,000 units (2 tablets) by mouth up to three times a day WITH DAIRY PRODUCTS)   methocarbamol (ROBAXIN) 500 MG tablet TAKE 1 TABLET (500 MG TOTAL) BY MOUTH 3 (THREE) TIMES DAILY AS NEEDED FOR MUSCLE SPASMS (SEDATION PRECAUTIONS).   moxifloxacin (VIGAMOX) 0.5 % ophthalmic solution Place 1 drop into the right eye 4 (four) times daily.   Multiple Vitamin (MULTIVITAMIN) tablet Take 1 tablet by mouth at bedtime.   omeprazole (PRILOSEC) 20 MG capsule TAKE 1 CAPSULE (20 MG TOTAL) BY MOUTH DAILY.   PROLENSA 0.07 % SOLN Place 1 drop into the left eye daily.   promethazine (PHENERGAN) 25 MG tablet Take 0.5-1 tablets (12.5-25 mg total) by mouth 2 (two) times daily as needed for nausea.   THERATEARS PF 0.25 % SOLN Place 2 drops into both eyes daily as needed (dry eyes).   topiramate (TOPAMAX) 25 MG tablet Take 1-2 tablets (25-50 mg total) by mouth See admin instructions. Take 25 mg by mouth in the morning and 50 mg at bedtime   triamcinolone cream (KENALOG) 0.1 % APPLY TO AFFECTED AREA TWICE A DAY (Patient taking differently: Apply 1 application. topically daily as needed (rash). APPLY TO AFFECTED AREA TWICE A DAY)   TRUEplus Lancets 33G MISC Check blood sugar once daily   Ubrogepant (UBRELVY) 50 MG TABS Take 1 tab at onset of migraine.  May repeat in 2 hrs, if needed.  Max dose: 2 tabs/day. This is a 30 day prescription.   vitamin B-12 (CYANOCOBALAMIN) 1000 MCG tablet Take 1 tablet (1,000 mcg total) by mouth every Monday, Wednesday, and Friday.   No facility-administered encounter medications on file as of 10/13/2021.    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    Migraine    Stroke [facility])    Urge incontinence 04/29/2014   Past Surgical History:  Procedure Laterality Date   ABDOMINAL HYSTERECTOMY  2006   cervix remains, menorrhagia   [place]   right milk duct removed   CARPAL TUNNEL RELEASE Right 02/2017   CHOLECYSTECTOMY  1971   COLONOSCOPY  11/2005   diverticulosis o/w WNL [facility])   DEXA  2015   spine -0.4, hip -1.0 WNL   LITHOTRIPSY     LUMBAR EPIDURAL INJECTION Left 10/2017   transforaminal L L3/4 (Dr Maryjean Ka)  TUBAL LIGATION     Family History  Problem Relation Age of Onset   Heart disease Mother 40   Hyperlipidemia Mother    Hyperlipidemia Father    Stroke Father    Heart disease Father    Diabetes Father    Fibromyalgia Sister    Hyperlipidemia Sister    Heart disease Brother    Diabetes Brother    Social History   Socioeconomic History   Marital status: Married    Spouse name: [name]   Number of children: 1   Years of education: 12   Highest education level: Not on file  Occupational History   Occupation: Retired    Comment: Retired  Tobacco Use   Smoking status: Never   Smokeless tobacco: Never  Scientific laboratory technician Use: Never used  Substance and Sexual Activity   Alcohol use: No    Alcohol/week: 0.0 standard drinks   Drug use: No   Sexual activity: Not Currently  Other Topics Concern   Not on file  Social History Narrative   Patient is married and lives at home with her husband [name]). Patient is retired.    Edu: high school education.   Right handed.   Exercise 3 times a week.   Diet: good water, fruits/vegetables daily   Caffeine: None      HAs living  will, HCPOA, full code   Social Determinants of Health   Financial Resource Strain: Low Risk    Difficulty of Paying Living Expenses: Not hard at all  Food Insecurity: No Food Insecurity   Worried About Charity fundraiser in the Last Year: Never true   [place] in the Last Year: Never true  Transportation Needs: No Transportation Needs   Lack of Transportation (Medical): No   Lack of Transportation (Non-Medical): No  Physical Activity: Sufficiently Active   Days of Exercise per Week: 5 days   Minutes of Exercise per Session: 30 min  Stress: No Stress Concern Present   Feeling of Stress : Not at all  Social Connections: Socially Integrated   Frequency of Communication with Friends and Family: More than three times a week   Frequency of Social Gatherings with Friends and Family: More than three times a week   Attends Religious Services: More than 4 times per year   Active Member of [employer] or Organizations: Yes   Attends Music therapist: More than 4 times per year   Marital Status: Married    Tobacco Counseling Counseling given: Not Answered   Clinical Intake:   Diabetic?  No  Interpreter Needed?: No Activities of Daily Living    10/13/2021    9:24 AM  In your present state of health, do you have any difficulty performing the following activities:  Hearing? 0  Vision? 0  Difficulty concentrating or making decisions? 0  Walking or climbing stairs? 0  Dressing or bathing? 0  Doing errands, shopping? 0  Preparing Food and eating ? N  Using the Toilet? N  In the past six months, have you accidently leaked urine? N  Do you have problems with loss of bowel control? N  Managing your Medications? N  Managing your Finances? N  Housekeeping or managing your Housekeeping? N    Patient Care Team: [name], MD as PCP - General (Family Medicine) Dannielle Karvonen, RN as Case Manager  Indicate any recent Medical Services you may have received from  other than [facility] providers in the past  year (date may be approximate).     Assessment:   This is a routine wellness examination for Latrica.  Hearing/Vision screen Hearing Screening - Comments:: No difficulty hearing Vision Screening - Comments:: Wears glasses. Follow[facility]  Dietary issues and exercise activities discussed: Exercise limited by: None identified   Goals Addressed               This Visit's Progress     Increase physical activity (pt-stated)        I would like to get back to gym, also find anew house.       Depression Screen    10/13/2021    9:22 AM 11/26/2020    3:50 PM 10/06/2020    2:48 PM 09/20/2019   10:30 AM 09/19/2018   12:58 PM 09/08/2017   11:38 AM 07/25/2017    3:25 PM  PHQ 2/9 Scores  PHQ - 2 Score 0 1 2 0 0 0 0  PHQ- 9 Score   6 0 0 0     Fall Risk    10/13/2021    9:25 AM 11/26/2020    3:49 PM 10/06/2020    2:08 PM 09/20/2019   10:29 AM 09/19/2018   12:58 PM  Fall Risk   Falls in the past year? 0 0 0 0 0  Number falls in past yr: 0 0  0   Injury with Fall? 0   0   Risk for fall due to : No Fall Risks   Medication side effect   Follow up    Falls evaluation completed;Falls prevention discussed     FALL RISK PREVENTION PERTAINING TO THE HOME:  Any stairs in or around the home? Yes  If so, are there any without handrails? No  Home free of loose throw rugs in walkways, pet beds, electrical cords, etc? Yes  Adequate lighting in your home to reduce risk of falls? Yes   ASSISTIVE DEVICES UTILIZED TO PREVENT FALLS:  Life alert? No  Use of a cane, walker or w/c? Yes  Grab bars in the bathroom? Yes  Shower chair or bench in shower? Yes  Elevated toilet seat or a handicapped toilet? Yes   TIMED UP AND GO:  Was the test performed? No . Audio Visit  Cognitive Function:    09/20/2019   10:32 AM 09/19/2018    1:03 PM 05/03/2017    2:40 PM  MMSE - Mini Mental State Exam  Orientation to time _0 Orientation to Place _1 Registration _2 Attention/ Calculation 5 0 0  Recall _3 Language- name 2 objects  0 0  Language- repeat _4 Language- follow 3 step command  0 3  Language- read & follow direction  0 0  Write a sentence  0 0  Copy design  0 0  Total score  17 20        10/13/2021    9:27 AM  6CIT Screen  What Year? 0 points  What month? 0 points  What time? 0 points  Count back from 20 0 points  Months in reverse 0 points  Repeat phrase 0 points  Total Score 0 points    Immunizations Immunization History  Administered Date(s) Administered   Fluad Quad(high Dose 65+) 01/23/2019, 02/13/2020   Influenza, High Dose Seasonal PF 01/28/2015, 01/26/2021   Influenza,inj,Quad PF,6+ Mos 01/09/2013, 01/22/2016, 02/01/2017, 02/15/2018   Influenza-Unspecified 02/18/2014, 02/07/2016, 02/06/2017  [place] COVID-19 SARS-COV-2 PEDS BIVALENT BOOSTER 6Y-11Y 02/23/2021   Moderna SARS-COV2 Booster Vaccination 03/16/2020, 08/18/2020   Moderna Sars-Covid-2 Vaccination 05/31/2019, 06/27/2019   Pneumococcal Conjugate-13 04/29/2014   Pneumococcal Polysaccharide-23 05/03/2016   Pneumococcal-Unspecified 05/09/2010   Zoster Recombinat (Shingrix) 02/09/2018, 05/22/2018   Zoster, Live 05/09/2008    TDAP status: Due, Education has been provided regarding the importance of this vaccine. Advised may receive this vaccine at local pharmacy or Health Dept. Aware to provide a copy of the vaccination record if obtained from local pharmacy or Health Dept. Verbalized acceptance and understanding.  Flu Vaccine status: Up to date  Pneumococcal vaccine status: Up to date  Covid-19 vaccine status: Completed vaccines  Qualifies for Shingles Vaccine? Yes   Zostavax completed Yes   Shingrix Completed?: Yes  Screening Tests Health Maintenance  Topic Date Due   OPHTHALMOLOGY EXAM  04/13/2021   URINE MICROALBUMIN  09/21/2021   HEMOGLOBIN A1C  10/05/2021   TETANUS/TDAP  10/14/2022 (Originally 01/24/1965)    INFLUENZA VACCINE  12/07/2021   FOOT EXAM  04/07/2022   MAMMOGRAM  05/20/2022   Fecal DNA (Cologuard)  05/27/2022   Pneumonia Vaccine 74+ Years old  Completed   DEXA SCAN  Completed   COVID-19 Vaccine  Completed   Hepatitis C Screening  Completed   Zoster Vaccines- Shingrix  Completed   HPV VACCINES  Aged Out    Health Maintenance  Health Maintenance Due  Topic Date Due   OPHTHALMOLOGY EXAM  04/13/2021   URINE MICROALBUMIN  09/21/2021   HEMOGLOBIN A1C  10/05/2021    Colorectal cancer screening: Type of screening: Cologuard. Completed 05/28/19. Repeat every 3 years  Mammogram status: Completed 05/20/21. Repeat every year  Bone Density status: Completed 05/07/14. Results reflect: Bone density results: OSTEOPOROSIS. Repeat every   years.  Lung Cancer Screening: (Low Dose CT Chest recommended if Age 96-80 years, 30 pack-year currently smoking OR have quit w/in 15years.) does not qualify.     Additional Screening:  Hepatitis C Screening: does qualify; Completed 05/01/15  Vision Screening: Recommended annual ophthalmology exams for early detection of glaucoma and other disorders of the eye. Is the patient up to date with their annual eye exam?  Yes  Who is the provider or what is the name of the office in which the patient attends annual eye exams? [place] If pt is not established with a provider, would they like to be referred to a provider to establish care? No .   Dental Screening: Recommended annual dental exams for proper oral hygiene  Community Resource Referral / Chronic Care Management:  CRR required this visit?  No   CCM required this visit?  No      Plan:     I have personally reviewed and noted the following in the patient's chart:   Medical and social history Use of alcohol, tobacco or illicit drugs  Current medications and supplements including opioid prescriptions.  Functional ability and status Nutritional status Physical  activity Advanced directives List of other physicians Hospitalizations, surgeries, and ER visits in previous 12 months Vitals Screenings to include cognitive, depression, and falls Referrals and appointments  In addition, I have reviewed and discussed with patient certain preventive protocols, quality metrics, and best practice recommendations. A written personalized care plan for preventive services as well as general preventive health recommendations were provided to patient.     Criselda Peaches, LPN   11/15/8919   Nurse Notes: None

## 2021-10-15 ENCOUNTER — Encounter: Payer: Self-pay | Admitting: Family Medicine

## 2021-10-15 ENCOUNTER — Ambulatory Visit (INDEPENDENT_AMBULATORY_CARE_PROVIDER_SITE_OTHER): Payer: Medicare HMO | Admitting: Family Medicine

## 2021-10-15 VITALS — BP 120/60 | HR 67 | Temp 97.3°F | Ht 63.0 in | Wt 166.0 lb

## 2021-10-15 DIAGNOSIS — M79671 Pain in right foot: Secondary | ICD-10-CM | POA: Insufficient documentation

## 2021-10-15 NOTE — Patient Instructions (Signed)
Start warm soaks or compresses for possible foreign body. Limit weight bearing.  Apply topical antibiotic ointment and can use corn pad to limit pressure on lesion.  If not improving  or any increasing pain, new redness... call for referral to podiatry.

## 2021-10-15 NOTE — Assessment & Plan Note (Signed)
Acute  Not clearly consistent with plantars wart given appearance and preservation of footprint.  No joint pain or redness, no tenderness over metatarsals.  Tenderness is focal over a raised pinpoint lesion with surrounding callus.  There is concern for possible foreign body although with gentle probing I cannot see any.  Alternately this may be due to simple callus/corn  We will start with warm compresses/warm water soaks to help any possible foreign body be extruded.  Dana [name] will wear a corn pad to limit pressure on the site and limit weightbearing.  If any redness or discharge occurs Dana [name] will call given her diabetes.  If pain is not improving we will refer her to podiatrist for further evaluation

## 2021-10-15 NOTE — Progress Notes (Signed)
Patient ID: Dana [name], female    DOB: 1945/08/16, 75 y.o.   MRN: [id number]  This visit was conducted in person.  BP 120/60 (BP Location: Left Arm, Patient Position: Sitting, Cuff Size: Normal)   Pulse 67   Temp (!) 97.3 F (36.3 C) (Temporal)   Ht '5\' 3"'  (1.6 m)   Wt 166 lb (75.3 kg)   SpO2 97%   BMI 29.41 kg/m    CC:  Chief Complaint  Patient presents with   Acute Visit    R foot pain. Patient states she has a hard place on the ball of her foot x 3 weeks.    Subjective:   HPI: Dana [name] is a 76 y.o. female  patient of Dr. Darnell Level  with history of  DM presenting on 10/15/2021 for Acute Visit (R foot pain. Patient states she has a hard place on the ball of her foot x 3 weeks.)   New 3 week ago noted new onset  firm hard area on ball of  right foot. Area is sore when walking. Has made her limp.  No pain at rest. She typically wears sneaker/ supportive shoes.   No known injury . No fall.  No open sore, no redness. She has abnormal gait chronically given past CVA... using cane to walk.   She has been on steps more given she is moving.   No hx of foot surgery.     Relevant past medical, surgical, family and social history reviewed and updated as indicated. Interim medical history since our last visit reviewed. Allergies and medications reviewed and updated. Outpatient Medications Prior to Visit  Medication Sig Dispense Refill   acetaminophen (TYLENOL) 325 MG tablet Take 1 tablet (325 mg total) by mouth every 6 (six) hours as needed for mild pain.     Alcohol Swabs (DROPSAFE ALCOHOL PREP) 70 % PADS USE TO CHECK BLOOD SUGAR ONCE DAILY 100 each 0   atorvastatin (LIPITOR) 40 MG tablet TAKE 1 TABLET AT BEDTIME (Patient taking differently: Take 40 mg by mouth at bedtime.) 90 tablet 1   benzonatate (TESSALON) 100 MG capsule Take 1 capsule (100 mg total) by mouth 3 (three) times daily as needed for cough. 30 capsule 0   Blood Glucose Calibration (TRUE METRIX LEVEL 1) Low SOLN USE  TO CHECK METER 1 each 0   Blood Glucose Monitoring Suppl (TRUE METRIX METER) w/Device KIT Check blood sugar once daily 1 kit 0   butalbital-acetaminophen-caffeine (FIORICET) 50-325-40 MG tablet TAKE 1 TABLET EVERY 6 HOURS AS NEEDED FOR HEADACHE (Patient taking differently: Take 1 tablet by mouth every 6 (six) hours as needed for migraine or headache.) 10 tablet 3   clopidogrel (PLAVIX) 75 MG tablet TAKE 1 TABLET (75 MG TOTAL) BY MOUTH DAILY. 90 tablet 3   co-enzyme Q-10 30 MG capsule Take 100 mg by mouth daily.     DUREZOL 0.05 % EMUL Place 1 drop into the left eye 3 (three) times daily.     fluticasone (FLONASE) 50 MCG/ACT nasal spray USE 2 SPRAYS IN EACH NOSTRIL EVERY DAY (Patient taking differently: Place 2 sprays into both nostrils at bedtime.) 48 g 2   ketorolac (ACULAR) 0.5 % ophthalmic solution Place 1 drop into the right eye 4 (four) times daily.     lactase (LACTAID) 3000 units tablet Take 2 tablets (6,000 Units total) by mouth 3 (three) times daily with meals. (Patient taking differently: Take 6,000 Units by mouth See admin instructions. Take 6,000 units (  2 tablets) by mouth up to three times a day WITH DAIRY PRODUCTS)     methocarbamol (ROBAXIN) 500 MG tablet TAKE 1 TABLET (500 MG TOTAL) BY MOUTH 3 (THREE) TIMES DAILY AS NEEDED FOR MUSCLE SPASMS (SEDATION PRECAUTIONS). 60 tablet 1   moxifloxacin (VIGAMOX) 0.5 % ophthalmic solution Place 1 drop into the right eye 4 (four) times daily.     Multiple Vitamin (MULTIVITAMIN) tablet Take 1 tablet by mouth at bedtime.     omeprazole (PRILOSEC) 20 MG capsule TAKE 1 CAPSULE (20 MG TOTAL) BY MOUTH DAILY. 90 capsule 2   PROLENSA 0.07 % SOLN Place 1 drop into the left eye daily.     promethazine (PHENERGAN) 25 MG tablet Take 0.5-1 tablets (12.5-25 mg total) by mouth 2 (two) times daily as needed for nausea. 20 tablet 0   THERATEARS PF 0.25 % SOLN Place 2 drops into both eyes daily as needed (dry eyes).     topiramate (TOPAMAX) 25 MG tablet Take 1-2  tablets (25-50 mg total) by mouth See admin instructions. Take 25 mg by mouth in the morning and 50 mg at bedtime 270 tablet 4   triamcinolone cream (KENALOG) 0.1 % APPLY TO AFFECTED AREA TWICE A DAY (Patient taking differently: Apply 1 application  topically daily as needed (rash). APPLY TO AFFECTED AREA TWICE A DAY) 80 g 1   TRUE METRIX BLOOD GLUCOSE TEST test strip CHECK BLOOD SUGAR ONCE DAILY 100 strip 0   TRUEplus Lancets 33G MISC CHECK BLOOD SUGAR ONCE DAILY 100 each 0   Ubrogepant (UBRELVY) 50 MG TABS Take 1 tab at onset of migraine.  May repeat in 2 hrs, if needed.  Max dose: 2 tabs/day. This is a 30 day prescription. 12 tablet 11   vitamin B-12 (CYANOCOBALAMIN) 1000 MCG tablet Take 1 tablet (1,000 mcg total) by mouth every Monday, Wednesday, and Friday.     No facility-administered medications prior to visit.     Per HPI unless specifically indicated in ROS section below Review of Systems  Constitutional:  Negative for fatigue and fever.  HENT:  Negative for congestion.   Eyes:  Negative for pain.  Respiratory:  Negative for cough and shortness of breath.   Cardiovascular:  Negative for chest pain, palpitations and leg swelling.  Gastrointestinal:  Negative for abdominal pain.  Genitourinary:  Negative for dysuria and vaginal bleeding.  Musculoskeletal:  Negative for back pain.  Neurological:  Negative for syncope, light-headedness and headaches.  Psychiatric/Behavioral:  Negative for dysphoric mood.    Objective:  BP 120/60 (BP Location: Left Arm, Patient Position: Sitting, Cuff Size: Normal)   Pulse 67   Temp (!) 97.3 F (36.3 C) (Temporal)   Ht '5\' 3"'  (1.6 m)   Wt 166 lb (75.3 kg)   SpO2 97%   BMI 29.41 kg/m   Wt Readings from Last 3 Encounters:  10/15/21 166 lb (75.3 kg)  10/13/21 163 lb (73.9 kg)  04/07/21 158 lb (71.7 kg)      Physical Exam Constitutional:      General: She is not in acute distress.    Appearance: Normal appearance. She is well-developed. She  is not ill-appearing or toxic-appearing.  HENT:     Head: Normocephalic.     Right Ear: Hearing, tympanic membrane, ear canal and external ear normal. Tympanic membrane is not erythematous, retracted or bulging.     Left Ear: Hearing, tympanic membrane, ear canal and external ear normal. Tympanic membrane is not erythematous, retracted or bulging.  Nose: No mucosal edema or rhinorrhea.     Right Sinus: No maxillary sinus tenderness or frontal sinus tenderness.     Left Sinus: No maxillary sinus tenderness or frontal sinus tenderness.     Mouth/Throat:     Pharynx: Uvula midline.  Eyes:     General: Lids are normal. Lids are everted, no foreign bodies appreciated.     Conjunctiva/sclera: Conjunctivae normal.     Pupils: Pupils are equal, round, and reactive to light.  Neck:     Thyroid: No thyroid mass or thyromegaly.     Vascular: No carotid bruit.     Trachea: Trachea normal.  Cardiovascular:     Rate and Rhythm: Normal rate and regular rhythm.     Pulses: Normal pulses.          Dorsalis pedis pulses are 2+ on the right side and 2+ on the left side.       Posterior tibial pulses are 2+ on the right side and 2+ on the left side.     Heart sounds: Normal heart sounds, S1 normal and S2 normal. No murmur heard.    No friction rub. No gallop.  Pulmonary:     Effort: Pulmonary effort is normal. No tachypnea or respiratory distress.     Breath sounds: Normal breath sounds. No decreased breath sounds, wheezing, rhonchi or rales.  Abdominal:     General: Bowel sounds are normal.     Palpations: Abdomen is soft.     Tenderness: There is no abdominal tenderness.  Musculoskeletal:     Cervical back: Normal range of motion and neck supple.     Right foot: Normal range of motion. No deformity, bunion or Charcot foot.     Left foot: Normal range of motion. No deformity, bunion or Charcot foot.  Feet:     Right foot:     Skin integrity: Skin integrity normal. No erythema.     Toenail  Condition: Fungal disease present.    Left foot:     Skin integrity: Skin integrity normal. No erythema.     Toenail Condition: Fungal disease present. Skin:    General: Skin is warm and dry.     Findings: No rash.  Neurological:     Mental Status: She is alert.  Psychiatric:        Mood and Affect: Mood is not anxious or depressed.        Speech: Speech normal.        Behavior: Behavior normal. Behavior is cooperative.        Thought Content: Thought content normal.        Judgment: Judgment normal.        Results for orders placed or performed during the hospital encounter of 08/22/21  Comprehensive metabolic panel  Result Value Ref Range   Sodium 144 135 - 145 mmol/L   Potassium 3.5 3.5 - 5.1 mmol/L   Chloride 118 (H) 98 - 111 mmol/L   CO2 21 (L) 22 - 32 mmol/L   Glucose, Bld 92 70 - 99 mg/dL   BUN 11 8 - 23 mg/dL   Creatinine, Ser 0.72 0.44 - 1.00 mg/dL   Calcium 8.5 (L) 8.9 - 10.3 mg/dL   Total Protein 6.2 (L) 6.5 - 8.1 g/dL   Albumin 3.5 3.5 - 5.0 g/dL   AST 19 15 - 41 U/L   ALT 25 0 - 44 U/L   Alkaline Phosphatase 62 38 - 126 U/L   Total Bilirubin 0.8  0.3 - 1.2 mg/dL   GFR, Estimated >60 >60 mL/min   Anion gap 5 5 - 15  CBC with Differential  Result Value Ref Range   WBC 4.2 4.0 - 10.5 K/uL   RBC 3.56 (L) 3.87 - 5.11 MIL/uL   Hemoglobin 10.9 (L) 12.0 - 15.0 g/dL   HCT 33.6 (L) 36.0 - 46.0 %   MCV 94.4 80.0 - 100.0 fL   MCH 30.6 26.0 - 34.0 pg   MCHC 32.4 30.0 - 36.0 g/dL   RDW 12.9 11.5 - 15.5 %   Platelets 201 150 - 400 K/uL   nRBC 0.0 0.0 - 0.2 %   Neutrophils Relative % 50 %   Neutro Abs 2.1 1.7 - 7.7 K/uL   Lymphocytes Relative 34 %   Lymphs Abs 1.4 0.7 - 4.0 K/uL   Monocytes Relative 14 %   Monocytes Absolute 0.6 0.1 - 1.0 K/uL   Eosinophils Relative 1 %   Eosinophils Absolute 0.0 0.0 - 0.5 K/uL   Basophils Relative 0 %   Basophils Absolute 0.0 0.0 - 0.1 K/uL   Immature Granulocytes 1 %   Abs Immature Granulocytes 0.06 0.00 - 0.07 K/uL      COVID 19 screen:  No recent travel or known exposure to COVID19 The patient denies respiratory symptoms of COVID 19 at this time. The importance of social distancing was discussed today.   Assessment and Plan Problem List Items Addressed This Visit     Right foot pain - Primary    Acute  Not clearly consistent with plantars wart given appearance and preservation of footprint.  No joint pain or redness, no tenderness over metatarsals.  Tenderness is focal over a raised pinpoint lesion with surrounding callus.  There is concern for possible foreign body although with gentle probing I cannot see any.  Alternately this may be due to simple callus/corn  We will start with warm compresses/warm water soaks to help any possible foreign body be extruded.  She will wear a corn pad to limit pressure on the site and limit weightbearing.  If any redness or discharge occurs she will call given her diabetes.  If pain is not improving we will refer her to podiatrist for further evaluation          Eliezer Lofts, MD

## 2021-11-08 ENCOUNTER — Ambulatory Visit (INDEPENDENT_AMBULATORY_CARE_PROVIDER_SITE_OTHER): Payer: Medicare HMO

## 2021-11-08 DIAGNOSIS — I6339 Cerebral infarction due to thrombosis of other cerebral artery: Secondary | ICD-10-CM

## 2021-11-08 DIAGNOSIS — E118 Type 2 diabetes mellitus with unspecified complications: Secondary | ICD-10-CM

## 2021-11-08 DIAGNOSIS — Z9181 History of falling: Secondary | ICD-10-CM

## 2021-11-08 NOTE — Chronic Care Management (AMB) (Signed)
Chronic Care Management   CCM RN Visit Note  11/08/2021 Name: Dana [name] MRN: [id number] DOB: 03-Feb-1946  Subjective: Dana [name] is a 76 y.o. year old female who is a primary care patient of [name], MD. The care management team was consulted for assistance with disease management and care coordination needs.    Engaged with patient by telephone for follow up visit in response to provider referral for case management and/or care coordination services.   Consent to Services:  The patient was given information about Chronic Care Management services, agreed to services, and gave verbal consent prior to initiation of services.  Please see initial visit note for detailed documentation.   Patient agreed to services and verbal consent obtained.   Assessment: Review of patient past medical history, allergies, medications, health status, including review of consultants reports, laboratory and other test data, was performed as part of comprehensive evaluation and provision of chronic care management services.   SDOH (Social Determinants of Health) assessments and interventions performed:    CCM Care Plan  Allergies  Allergen Reactions   Amitriptyline Other (See Comments)    Worsened migraine and confusion   Lactose Intolerance (Gi) Nausea Only and Other (See Comments)    Abdominal pain, also   Oxybutynin Other (See Comments)    Urinary retention/hesitancy   Tramadol Other (See Comments)    Causes confusion and dizziness   Latex Rash   Niacin And Related Itching   Septra [Sulfamethoxazole-Trimethoprim] Itching    Outpatient Encounter Medications as of 11/08/2021  Medication Sig Note   acetaminophen (TYLENOL) 325 MG tablet Take 1 tablet (325 mg total) by mouth every 6 (six) hours as needed for mild pain. 10/02/2020: ALL MEDICATIONS MUST BE TAKEN WITH APPLESAUCE   Alcohol Swabs (DROPSAFE ALCOHOL PREP) 70 % PADS USE TO CHECK BLOOD SUGAR ONCE DAILY    atorvastatin (LIPITOR) 40  MG tablet TAKE 1 TABLET AT BEDTIME (Patient taking differently: Take 40 mg by mouth at bedtime.)    benzonatate (TESSALON) 100 MG capsule Take 1 capsule (100 mg total) by mouth 3 (three) times daily as needed for cough.    Blood Glucose Calibration (TRUE METRIX LEVEL 1) Low SOLN USE TO CHECK METER    Blood Glucose Monitoring Suppl (TRUE METRIX METER) w/Device KIT Check blood sugar once daily    butalbital-acetaminophen-caffeine (FIORICET) 50-325-40 MG tablet TAKE 1 TABLET EVERY 6 HOURS AS NEEDED FOR HEADACHE (Patient taking differently: Take 1 tablet by mouth every 6 (six) hours as needed for migraine or headache.)    clopidogrel (PLAVIX) 75 MG tablet TAKE 1 TABLET (75 MG TOTAL) BY MOUTH DAILY.    co-enzyme Q-10 30 MG capsule Take 100 mg by mouth daily. 10/02/2020: ALL MEDICATIONS MUST BE TAKEN WITH APPLESAUCE   DUREZOL 0.05 % EMUL Place 1 drop into the left eye 3 (three) times daily.    fluticasone (FLONASE) 50 MCG/ACT nasal spray USE 2 SPRAYS IN EACH NOSTRIL EVERY DAY (Patient taking differently: Place 2 sprays into both nostrils at bedtime.)    ketorolac (ACULAR) 0.5 % ophthalmic solution Place 1 drop into the right eye 4 (four) times daily.    lactase (LACTAID) 3000 units tablet Take 2 tablets (6,000 Units total) by mouth 3 (three) times daily with meals. (Patient taking differently: Take 6,000 Units by mouth See admin instructions. Take 6,000 units (2 tablets) by mouth up to three times a day WITH DAIRY PRODUCTS) 10/02/2020: ALL MEDICATIONS MUST BE TAKEN WITH APPLESAUCE   methocarbamol (ROBAXIN) 500  MG tablet TAKE 1 TABLET (500 MG TOTAL) BY MOUTH 3 (THREE) TIMES DAILY AS NEEDED FOR MUSCLE SPASMS (SEDATION PRECAUTIONS).    moxifloxacin (VIGAMOX) 0.5 % ophthalmic solution Place 1 drop into the right eye 4 (four) times daily.    Multiple Vitamin (MULTIVITAMIN) tablet Take 1 tablet by mouth at bedtime. 10/02/2020: ALL MEDICATIONS MUST BE TAKEN WITH APPLESAUCE   omeprazole (PRILOSEC) 20 MG capsule TAKE 1  CAPSULE (20 MG TOTAL) BY MOUTH DAILY. 07/05/2021: Patient states she takes 40 mg daily   PROLENSA 0.07 % SOLN Place 1 drop into the left eye daily.    promethazine (PHENERGAN) 25 MG tablet Take 0.5-1 tablets (12.5-25 mg total) by mouth 2 (two) times daily as needed for nausea.    THERATEARS PF 0.25 % SOLN Place 2 drops into both eyes daily as needed (dry eyes).    topiramate (TOPAMAX) 25 MG tablet Take 1-2 tablets (25-50 mg total) by mouth See admin instructions. Take 25 mg by mouth in the morning and 50 mg at bedtime    triamcinolone cream (KENALOG) 0.1 % APPLY TO AFFECTED AREA TWICE A DAY (Patient taking differently: Apply 1 application  topically daily as needed (rash). APPLY TO AFFECTED AREA TWICE A DAY)    TRUE METRIX BLOOD GLUCOSE TEST test strip CHECK BLOOD SUGAR ONCE DAILY    TRUEplus Lancets 33G MISC CHECK BLOOD SUGAR ONCE DAILY    Ubrogepant (UBRELVY) 50 MG TABS Take 1 tab at onset of migraine.  May repeat in 2 hrs, if needed.  Max dose: 2 tabs/day. This is a 30 day prescription. 08/22/2021: Has not had to use this medication, keeps it on hand.    vitamin B-12 (CYANOCOBALAMIN) 1000 MCG tablet Take 1 tablet (1,000 mcg total) by mouth every Monday, Wednesday, and Friday. 10/02/2020: ALL MEDICATIONS MUST BE TAKEN WITH APPLESAUCE   No facility-administered encounter medications on file as of 11/08/2021.    Patient Active Problem List   Diagnosis Date Noted   Right foot pain 10/15/2021   Viral URI 04/07/2021   Cerebrovascular accident ([place]) 03/29/2021   Gait abnormality 03/29/2021   Bilateral knee pain 01/27/2021   Cyst of pancreas 01/13/2021   Left kidney mass 01/09/2021   Nausea 12/23/2020   Itching of both hands 10/20/2020   Sensation of cold in lower extremity 10/20/2020   Dysesthesia 09/21/2020   Hemiparesis due to recent stroke ([place]) 08/30/2020   Right sided weakness 08/29/2020   Pain in lateral portion of right ankle 07/20/2020   Plantar fasciitis of right foot 05/19/2020    Small vessel disease, cerebrovascular    Overactive bladder 10/24/2018   Amnesia memory loss 05/19/2017   Overweight (BMI 25.0-29.9) 05/09/2017   Lumbar herniated disc 02/01/2017   Medicare annual wellness visit, subsequent 05/03/2016   Lactose intolerance in adult    History of kidney stones    Cervical pain (neck) 12/04/2015   Health maintenance examination 04/29/2014   Paresthesia 04/29/2014   Advanced care planning/counseling discussion 04/29/2014   PFD (pelvic floor dysfunction) 04/29/2014   Controlled diabetes mellitus type 2 with complications ([place]) 66/59/9357   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   Hx of diverticulitis of colon 11/07/2012   Allergic rhinitis 11/07/2012   Hyperlipidemia associated with type 2 diabetes mellitus ([place]) 11/07/2012   Migraine 11/07/2012   Hereditary and idiopathic peripheral neuropathy 11/07/2012   LGSIL Pap smear of vaginal cuff 11/07/2012   GERD (gastroesophageal reflux disease) 11/07/2012   Family history of premature coronary heart disease 11/07/2012    Conditions  to be addressed/monitored:DMII and Falls, stroke  Care Plan : [facility] Plan of care  Updates made by Dannielle Karvonen, RN since 11/08/2021 12:00 AM     Problem: Chronic disease management education and care coordination needs.   Priority: High     Long-Range Goal: Development of plan of care to address chronic disease mangement and care coordination needs.   Start Date: 07/05/2021  Expected End Date: 12/06/2021  Priority: High  Note:   Goals met.  Case closed Current Barriers:  Knowledge Deficits related to plan of care for management of stroke, falls, diabetes Type 2  Patient states she was recently seen at her primary care provider office for a painful hard area on the bottom of her right foot.  Patient states she continues to have pain / discomfort in the area.  Patient states she did as advise by her doctor by applying warm soak/ compress, antibiotic ointment and using corn pad.   She states she wanted to continue this treatment for another week. RNCM advised patient to call her provider to report ongoing pain discomfort. Patient denies any falls. She denies any new onset stroke like symptoms. She states she continues to take her medications as prescribed.  Patient states she feels she is able to manage her health conditions at this time.  Discussed closing case with patient. Patient verbally agreed to case closure.       RNCM Clinical Goal(s):  Patient will verbalize understanding of plan for management of DMII and stroke, Falls as evidenced by patient self report and/ or notation in chart.  take all medications exactly as prescribed and will call provider for medication related questions as evidenced by patient self report and/ or notation in chart.     attend all scheduled medical appointments:   as evidenced by patient self report and / or notation in chart        continue to work with RN Care Manager and/or Social Worker to address care management and care coordination needs related to DMII and stroke, falls  as evidenced by adherence to CM Team Scheduled appointments     through collaboration with Consulting civil engineer, provider, and care team.   Interventions: 1:1 collaboration with primary care provider regarding development and update of comprehensive plan of care as evidenced by provider attestation and co-signature Inter-disciplinary care team collaboration (see longitudinal plan of care) Evaluation of current treatment plan related to  self management and patient's adherence to plan as established by provider   Diabetes Interventions:  (Status:  Goal Met.) Long Term Goal Assessed patient's understanding of A1c goal: <7% Reviewed medications with patient and discussed importance of medication adherence Discussed plans with patient for ongoing care management follow up and provided patient with direct contact information for care management team Reviewed  scheduled/upcoming provider appointments  Lab Results  Component Value Date   HGBA1C 6.1 (A) 04/07/2021   Falls Interventions:  (Status:  Goal Met.) Long Term Goal Reviewed medications and discussed potential side effects of medications such as dizziness and frequent urination Assessed for falls since last encounter Advised to continue to use ambulatory device (cane) as advised by your provider.    Stroke  (Status:  Goal Met.)  Long Term Goal Evaluation of current treatment plan related to  stroke ,  self-management and patient's adherence to plan as established by provider. Discussed plans with patient for ongoing care management follow up and provided patient with direct contact information for care management team Reviewed scheduled /  upcoming provider appointments.   Patient Goals/Self-Care Activities: Continue to take medications as prescribed   Attend all scheduled provider appointments Call pharmacy for medication refills 3-7 days in advance of running out of medications Call provider office for new concerns or questions  - Continue to utilize cane appropriately with  ambulation      Plan:No further follow up required: Goals met.  Case closed Quinn Plowman RN,BSN,CCM RN Case Manager [place]  [phone]

## 2021-11-08 NOTE — Patient Instructions (Signed)
Visit Information  Kanchan S Bisceglia Congratulations on achieving your goals! It was a pleasure working with you, and I hope you continue to make great strides in improving your health.  Thank you for allowing me to share the care management and care coordination services that are available to you as part of your health plan and services through your primary care provider and medical home. Please reach out to me at [phone] if the care management/care coordination team may be of assistance to you in the future.  Follow up Plan: If further intervention is needed the care management team is available to follow up after a formal CCM referral is placed by your primary care provider.     Quinn Plowman RN,BSN,CCM RN Case Manager [place]  [phone]

## 2021-11-11 ENCOUNTER — Telehealth: Payer: Medicare HMO

## 2021-11-15 ENCOUNTER — Telehealth: Payer: Self-pay

## 2021-11-15 DIAGNOSIS — M79671 Pain in right foot: Secondary | ICD-10-CM

## 2021-11-15 NOTE — Addendum Note (Signed)
Addended by: [name] on: 11/15/2021 11:23 AM   Modules accepted: Orders

## 2021-11-15 NOTE — Telephone Encounter (Addendum)
New podiatry referral placed.  Continue wearing callus pad for tender spot.

## 2021-11-15 NOTE — Telephone Encounter (Addendum)
Patient was advised to call if her foot was not doing nay better. Was seen on 6/9 and Dana [name] is still having issues and difficulty walking. Wanted to know if we could place the referral to a podiatrist. Also wanted to know what she can do in the meantime while waiting on the referral.

## 2021-11-15 NOTE — Telephone Encounter (Signed)
Spoke with pt relaying Dr. G's message.  Pt verbalizes understanding and expresses her thanks.  

## 2021-11-23 ENCOUNTER — Other Ambulatory Visit: Payer: Self-pay | Admitting: Family Medicine

## 2021-11-23 DIAGNOSIS — L282 Other prurigo: Secondary | ICD-10-CM

## 2021-11-23 NOTE — Telephone Encounter (Signed)
Refill request Triamcinolone Last office visit 10/15/21 acute Last refill 03/19/21  80 G/1 Upcoming appointment 01/26/22

## 2021-11-24 NOTE — Telephone Encounter (Signed)
ERx 

## 2021-12-02 ENCOUNTER — Ambulatory Visit: Payer: Medicare HMO | Admitting: Podiatry

## 2021-12-02 DIAGNOSIS — Q828 Other specified congenital malformations of skin: Secondary | ICD-10-CM

## 2021-12-02 DIAGNOSIS — M7751 Other enthesopathy of right foot: Secondary | ICD-10-CM | POA: Diagnosis not present

## 2021-12-02 NOTE — Progress Notes (Signed)
Subjective:  Patient ID: Dana [name], female    DOB: 1945-10-12,  MRN: [id number]  Chief Complaint  Patient presents with   Callouses    76 y.o. female presents with the above complaint.  Patient presents with right submetatarsal 1 porokeratosis with underlying capsulitis.  Patient states pain for touch is progressive gotten worse.  Hurts with ambulation.  She would like to discuss treatment options for it.  She has not seen anyone else prior to seeing me.  She is a diabetic.  She denies any other acute complaints.  Hurts with ambulation and hurts with pressure pain scale 7 out of 10   Review of Systems: Negative except as noted in the HPI. Denies N/V/F/Ch.  Past Medical History:  Diagnosis Date   Allergy    Anemia    Arthritis    Cataract    Controlled type 2 diabetes mellitus without complication, without long-term current use of insulin (HCC)    Diverticulitis    History of chicken pox    Hyperlipidemia    Kidney stone    Lactose intolerance in adult    Migraine    Stroke [facility])    Urge incontinence 04/29/2014    Current Outpatient Medications:    acetaminophen (TYLENOL) 325 MG tablet, Take 1 tablet (325 mg total) by mouth every 6 (six) hours as needed for mild pain., Disp: , Rfl:    Alcohol Swabs (DROPSAFE ALCOHOL PREP) 70 % PADS, USE TO CHECK BLOOD SUGAR ONCE DAILY, Disp: 100 each, Rfl: 0   atorvastatin (LIPITOR) 40 MG tablet, TAKE 1 TABLET AT BEDTIME (Patient taking differently: Take 40 mg by mouth at bedtime.), Disp: 90 tablet, Rfl: 1   benzonatate (TESSALON) 100 MG capsule, Take 1 capsule (100 mg total) by mouth 3 (three) times daily as needed for cough., Disp: 30 capsule, Rfl: 0   Blood Glucose Calibration (TRUE METRIX LEVEL 1) Low SOLN, USE TO CHECK METER, Disp: 1 each, Rfl: 0   Blood Glucose Monitoring Suppl (TRUE METRIX METER) w/Device KIT, Check blood sugar once daily, Disp: 1 kit, Rfl: 0   butalbital-acetaminophen-caffeine (FIORICET) 50-325-40 MG tablet, TAKE 1  TABLET EVERY 6 HOURS AS NEEDED FOR HEADACHE (Patient taking differently: Take 1 tablet by mouth every 6 (six) hours as needed for migraine or headache.), Disp: 10 tablet, Rfl: 3   clopidogrel (PLAVIX) 75 MG tablet, TAKE 1 TABLET (75 MG TOTAL) BY MOUTH DAILY., Disp: 90 tablet, Rfl: 3   co-enzyme Q-10 30 MG capsule, Take 100 mg by mouth daily., Disp: , Rfl:    DUREZOL 0.05 % EMUL, Place 1 drop into the left eye 3 (three) times daily., Disp: , Rfl:    fluticasone (FLONASE) 50 MCG/ACT nasal spray, USE 2 SPRAYS IN EACH NOSTRIL EVERY DAY (Patient taking differently: Place 2 sprays into both nostrils at bedtime.), Disp: 48 g, Rfl: 2   ketorolac (ACULAR) 0.5 % ophthalmic solution, Place 1 drop into the right eye 4 (four) times daily., Disp: , Rfl:    lactase (LACTAID) 3000 units tablet, Take 2 tablets (6,000 Units total) by mouth 3 (three) times daily with meals. (Patient taking differently: Take 6,000 Units by mouth See admin instructions. Take 6,000 units (2 tablets) by mouth up to three times a day WITH DAIRY PRODUCTS), Disp: , Rfl:    methocarbamol (ROBAXIN) 500 MG tablet, TAKE 1 TABLET (500 MG TOTAL) BY MOUTH 3 (THREE) TIMES DAILY AS NEEDED FOR MUSCLE SPASMS (SEDATION PRECAUTIONS)., Disp: 60 tablet, Rfl: 1   moxifloxacin (VIGAMOX) 0.5 %  ophthalmic solution, Place 1 drop into the right eye 4 (four) times daily., Disp: , Rfl:    Multiple Vitamin (MULTIVITAMIN) tablet, Take 1 tablet by mouth at bedtime., Disp: , Rfl:    omeprazole (PRILOSEC) 20 MG capsule, TAKE 1 CAPSULE (20 MG TOTAL) BY MOUTH DAILY., Disp: 90 capsule, Rfl: 2   PROLENSA 0.07 % SOLN, Place 1 drop into the left eye daily., Disp: , Rfl:    promethazine (PHENERGAN) 25 MG tablet, Take 0.5-1 tablets (12.5-25 mg total) by mouth 2 (two) times daily as needed for nausea., Disp: 20 tablet, Rfl: 0   THERATEARS PF 0.25 % SOLN, Place 2 drops into both eyes daily as needed (dry eyes)., Disp: , Rfl:    topiramate (TOPAMAX) 25 MG tablet, Take 1-2 tablets  (25-50 mg total) by mouth See admin instructions. Take 25 mg by mouth in the morning and 50 mg at bedtime, Disp: 270 tablet, Rfl: 4   triamcinolone cream (KENALOG) 0.1 %, Apply 1 Application topically 2 (two) times daily as needed (rash)., Disp: 80 g, Rfl: 0   TRUE METRIX BLOOD GLUCOSE TEST test strip, CHECK BLOOD SUGAR ONCE DAILY, Disp: 100 strip, Rfl: 0   TRUEplus Lancets 33G MISC, CHECK BLOOD SUGAR ONCE DAILY, Disp: 100 each, Rfl: 0   Ubrogepant (UBRELVY) 50 MG TABS, Take 1 tab at onset of migraine.  May repeat in 2 hrs, if needed.  Max dose: 2 tabs/day. This is a 30 day prescription., Disp: 12 tablet, Rfl: 11   vitamin B-12 (CYANOCOBALAMIN) 1000 MCG tablet, Take 1 tablet (1,000 mcg total) by mouth every Monday, Wednesday, and Friday., Disp: , Rfl:   Social History   Tobacco Use  Smoking Status Never  Smokeless Tobacco Never    Allergies  Allergen Reactions   Amitriptyline Other (See Comments)    Worsened migraine and confusion   Lactose Intolerance (Gi) Nausea Only and Other (See Comments)    Abdominal pain, also   Oxybutynin Other (See Comments)    Urinary retention/hesitancy   Tramadol Other (See Comments)    Causes confusion and dizziness   Latex Rash   Niacin And Related Itching   Septra [Sulfamethoxazole-Trimethoprim] Itching   Objective:  There were no vitals filed for this visit. There is no height or weight on file to calculate BMI. Constitutional Well developed. Well nourished.  Vascular Dorsalis pedis pulses palpable bilaterally. Posterior tibial pulses palpable bilaterally. Capillary refill normal to all digits.  No cyanosis or clubbing noted. Pedal hair growth normal.  Neurologic Normal speech. Oriented to person, place, and time. Epicritic sensation to light touch grossly present bilaterally.  Dermatologic Hyperkeratotic lesion noted with central nucleated core pain on palpation to the lesion.  Underlying capsulitis noted of the MTPJ joint with pain on  palpation to the MTPJ joint.  No excessive flexor tendinitis noted no sesamoidal complex noted.  Orthopedic: Normal joint ROM without pain or crepitus bilaterally. No visible deformities. No bony tenderness.   Radiographs: None Assessment:   1. Capsulitis of metatarsophalangeal (MTP) joint of right foot   2. Porokeratosis    Plan:  Patient was evaluated and treated and all questions answered.  Right submetatarsal 1 porokeratosis with underlying capsulitis -All questions and concerns were discussed with the patient in extensive detail given the amount of pain that she is experiencing she will benefit from aggressive debridement of the lesion using chisel blade handle the lesion was debrided down to healthy scar tissue no complication noted no pinpoint bleeding noted.  She would also  benefit from a steroid injection to help decrease acute inflammation surrounding the lesion.  I discussed with patient she states understanding would like to proceed with steroid injection as well -A steroid injection was performed at right first MTP using 1% plain Lidocaine and 10 mg of Kenalog. This was well tolerated.   No follow-ups on file.

## 2021-12-03 ENCOUNTER — Telehealth: Payer: Self-pay

## 2021-12-03 NOTE — Telephone Encounter (Signed)
Created in error

## 2021-12-06 DIAGNOSIS — I6339 Cerebral infarction due to thrombosis of other cerebral artery: Secondary | ICD-10-CM

## 2021-12-06 DIAGNOSIS — E118 Type 2 diabetes mellitus with unspecified complications: Secondary | ICD-10-CM

## 2022-01-17 ENCOUNTER — Other Ambulatory Visit: Payer: Self-pay | Admitting: Family Medicine

## 2022-01-17 DIAGNOSIS — L282 Other prurigo: Secondary | ICD-10-CM

## 2022-01-17 NOTE — Telephone Encounter (Signed)
Refill request Triamcinolone Last refill 11/24/21  80 G Last office visit 10/15/21 acute Upcoming appointment 01/26/22

## 2022-01-18 ENCOUNTER — Telehealth: Payer: Self-pay | Admitting: Family Medicine

## 2022-01-18 DIAGNOSIS — D649 Anemia, unspecified: Secondary | ICD-10-CM

## 2022-01-18 DIAGNOSIS — E1169 Type 2 diabetes mellitus with other specified complication: Secondary | ICD-10-CM

## 2022-01-18 DIAGNOSIS — E118 Type 2 diabetes mellitus with unspecified complications: Secondary | ICD-10-CM

## 2022-01-18 NOTE — Telephone Encounter (Signed)
Called and advised patient that a request has been sent to Dr. Danise Mina to put orders in for her CPE labs. Patient was advised that when orders are in she should be able to go to any [place] office and have her labs drawn. Patient was given the telephone number to [place] to call and schedule her lab appointment. Patient was advised that I will not cancel her lab appointment here until she finds out that she can get an appointment at [facility] when she wants it. Patient will call back and cancel the  lab appointment here if she is able to her lab work drawn at another location.

## 2022-01-18 NOTE — Telephone Encounter (Signed)
Pt called in requesting to get her Cpxlab done at the [place] office . Would like to keep the same date . Please advise [phone]

## 2022-01-19 NOTE — Telephone Encounter (Signed)
Noted  Labs ordered

## 2022-01-20 ENCOUNTER — Other Ambulatory Visit: Payer: Medicare HMO

## 2022-01-21 ENCOUNTER — Other Ambulatory Visit (INDEPENDENT_AMBULATORY_CARE_PROVIDER_SITE_OTHER): Payer: Medicare HMO

## 2022-01-21 DIAGNOSIS — D649 Anemia, unspecified: Secondary | ICD-10-CM

## 2022-01-21 DIAGNOSIS — E785 Hyperlipidemia, unspecified: Secondary | ICD-10-CM

## 2022-01-21 DIAGNOSIS — E1169 Type 2 diabetes mellitus with other specified complication: Secondary | ICD-10-CM | POA: Diagnosis not present

## 2022-01-21 DIAGNOSIS — E118 Type 2 diabetes mellitus with unspecified complications: Secondary | ICD-10-CM | POA: Diagnosis not present

## 2022-01-21 LAB — CBC WITH DIFFERENTIAL/PLATELET
Basophils Absolute: 0 10*3/uL (ref 0.0–0.1)
Basophils Relative: 0.6 % (ref 0.0–3.0)
Eosinophils Absolute: 0 10*3/uL (ref 0.0–0.7)
Eosinophils Relative: 0.7 % (ref 0.0–5.0)
HCT: 35.8 % — ABNORMAL LOW (ref 36.0–46.0)
Hemoglobin: 12 g/dL (ref 12.0–15.0)
Lymphocytes Relative: 32.2 % (ref 12.0–46.0)
Lymphs Abs: 1.4 10*3/uL (ref 0.7–4.0)
MCHC: 33.5 g/dL (ref 30.0–36.0)
MCV: 91.6 fl (ref 78.0–100.0)
Monocytes Absolute: 0.7 10*3/uL (ref 0.1–1.0)
Monocytes Relative: 17 % — ABNORMAL HIGH (ref 3.0–12.0)
Neutro Abs: 2.2 10*3/uL (ref 1.4–7.7)
Neutrophils Relative %: 49.5 % (ref 43.0–77.0)
Platelets: 208 10*3/uL (ref 150.0–400.0)
RBC: 3.91 Mil/uL (ref 3.87–5.11)
RDW: 14 % (ref 11.5–15.5)
WBC: 4.4 10*3/uL (ref 4.0–10.5)

## 2022-01-21 LAB — COMPREHENSIVE METABOLIC PANEL
ALT: 18 U/L (ref 0–35)
AST: 15 U/L (ref 0–37)
Albumin: 4.1 g/dL (ref 3.5–5.2)
Alkaline Phosphatase: 70 U/L (ref 39–117)
BUN: 16 mg/dL (ref 6–23)
CO2: 27 mEq/L (ref 19–32)
Calcium: 9 mg/dL (ref 8.4–10.5)
Chloride: 110 mEq/L (ref 96–112)
Creatinine, Ser: 0.77 mg/dL (ref 0.40–1.20)
GFR: 75.69 mL/min (ref >60.00)
Glucose, Bld: 105 mg/dL — ABNORMAL HIGH (ref 70–99)
Potassium: 3.7 mEq/L (ref 3.5–5.1)
Sodium: 144 mEq/L (ref 135–145)
Total Bilirubin: 0.6 mg/dL (ref 0.2–1.2)
Total Protein: 7.1 g/dL (ref 6.0–8.3)

## 2022-01-21 LAB — LIPID PANEL
Cholesterol: 137 mg/dL (ref 0–200)
HDL: 49.2 mg/dL (ref >39.00)
LDL Cholesterol: 72 mg/dL (ref 0–99)
NonHDL: 88.08
Total CHOL/HDL Ratio: 3
Triglycerides: 78 mg/dL (ref 0.0–149.0)
VLDL: 15.6 mg/dL (ref 0.0–40.0)

## 2022-01-21 LAB — MICROALBUMIN / CREATININE URINE RATIO
Creatinine,U: 88.1 mg/dL
Microalb Creat Ratio: 3.5 mg/g (ref 0.0–30.0)
Microalb, Ur: 3.1 mg/dL — ABNORMAL HIGH (ref 0.0–1.9)

## 2022-01-21 LAB — HEMOGLOBIN A1C: Hgb A1c MFr Bld: 6.6 % — ABNORMAL HIGH (ref 4.6–6.5)

## 2022-01-24 ENCOUNTER — Other Ambulatory Visit: Payer: Self-pay | Admitting: Family Medicine

## 2022-01-24 NOTE — Telephone Encounter (Signed)
Refill request Robaxin Last refill 04/21/21 #60/1 Last office visit 10/15/21 acute Upcoming appointment 01/26/22

## 2022-01-26 ENCOUNTER — Encounter: Payer: Self-pay | Admitting: Family Medicine

## 2022-01-26 ENCOUNTER — Ambulatory Visit (INDEPENDENT_AMBULATORY_CARE_PROVIDER_SITE_OTHER): Payer: Medicare HMO | Admitting: Family Medicine

## 2022-01-26 VITALS — BP 122/74 | HR 68 | Temp 98.0°F | Ht 62.25 in | Wt 168.2 lb

## 2022-01-26 DIAGNOSIS — E785 Hyperlipidemia, unspecified: Secondary | ICD-10-CM | POA: Diagnosis not present

## 2022-01-26 DIAGNOSIS — K219 Gastro-esophageal reflux disease without esophagitis: Secondary | ICD-10-CM | POA: Diagnosis not present

## 2022-01-26 DIAGNOSIS — E1169 Type 2 diabetes mellitus with other specified complication: Secondary | ICD-10-CM

## 2022-01-26 DIAGNOSIS — E118 Type 2 diabetes mellitus with unspecified complications: Secondary | ICD-10-CM | POA: Diagnosis not present

## 2022-01-26 DIAGNOSIS — Z0000 Encounter for general adult medical examination without abnormal findings: Secondary | ICD-10-CM

## 2022-01-26 DIAGNOSIS — R269 Unspecified abnormalities of gait and mobility: Secondary | ICD-10-CM

## 2022-01-26 DIAGNOSIS — I69351 Hemiplegia and hemiparesis following cerebral infarction affecting right dominant side: Secondary | ICD-10-CM

## 2022-01-26 DIAGNOSIS — Z8673 Personal history of transient ischemic attack (TIA), and cerebral infarction without residual deficits: Secondary | ICD-10-CM | POA: Diagnosis not present

## 2022-01-26 MED ORDER — PANTOPRAZOLE SODIUM 40 MG PO TBEC: 40.0000 mg | DELAYED_RELEASE_TABLET | Freq: Every day | ORAL | 3 refills | Status: DC

## 2022-01-26 MED ORDER — PANTOPRAZOLE SODIUM 40 MG PO TBEC: 40.0000 mg | DELAYED_RELEASE_TABLET | Freq: Every day | ORAL | 0 refills | Status: DC

## 2022-01-26 NOTE — Progress Notes (Unsigned)
Patient ID: Dana [name], female    DOB: [birth year], 76 y.o.   MRN: [id number]  This visit was conducted in person.  BP 122/74   Pulse 68   Temp 98 F (36.7 C) (Temporal)   Ht 5' 2.25" (1.581 m)   Wt 168 lb 4 oz (76.3 kg)   SpO2 97%   BMI 30.53 kg/m    CC: CPE Subjective:   HPI: Dana [name] is a 76 y.o. female presenting on 01/26/2022 for Annual Exam [facility] prt 2.  Wants to discuss increasing omeprazole to 40 mg. Pt accompanied by husband, [name]. )   I last saw patient 03/2021.  Saw health advisor 10/2021 for medicare wellness visit. Note reviewed.    No results found.  Flowsheet Row Clinical Support from 10/13/2021 in [place] at [place]  PHQ-2 Total Score 0          10/13/2021    9:25 AM 11/26/2020    3:49 PM 10/06/2020    2:08 PM 09/20/2019   10:29 AM 09/19/2018   12:58 PM  Fall Risk   Falls in the past year? 0 0 0 0 0  Number falls in past yr: 0 0  0   Injury with Fall? 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 Notes residual R leg weakness since. She is going to senior center regularly in [place] - hopeful this will help. She may be interested in formal outpatient physical therapy in [place]. Notes ongoing imbalance and fear of fall  She's been taking omeprazole 18m daily with benefit. 226mdose not effective. Request refill at higher dose.   Preventative: COLONOSCOPY 11/2005; diverticulosis o/w WNL (FMikel Cella Cologuard WNL 2017, 2021  [facility]yrlast 12/2020. Normal pap smears.  [facility]  DEXA 2015 spine -0.4, hip -1.0 WNL  Lung cancer screening - not eligible  Flu shot - yearly COVID vaccine moderna 05/2019, 06/2019, Moderna booster 03/2020, 08/2020, bivalent 02/2021 Td unsure Pneumovax 2012, prevnar-13  2015, pneumovax 2017 zostavax - 2010. h/o shingles x2 Shingrix - 02/2018, 05/2018 Advanced directive discussion - durable POA is husband MauLinton Rumpno HCPOA set up. Has living will. Full code. Does not want prolonged life support by extraordinary means. Scanned into chart 09/2016. Seat belt use discussed  Sunscreen use discussed, no changing moles on skin Non smoker  Alcohol - rare  Dentist - Q6 mo  Eye exam yearly  Bowels - no constipation Bladder - no incontinence   Patient is married and lives at home with her husband (MaJerrye BeaversPatient is retired.  Edu: high school education.  Right handed.  Exercise 3 times a week.  Diet: good water, fruits/vegetables daily - joined weight watchers but no longer.  Caffeine: None      Relevant past medical, surgical, family and social history reviewed and updated as indicated. Interim medical history since our last visit reviewed. Allergies and medications reviewed and updated. Outpatient Medications Prior to Visit  Medication Sig Dispense Refill   acetaminophen (TYLENOL) 325 MG tablet Take 1 tablet (325 mg total) by mouth every 6 (six) hours as needed for mild pain.     Alcohol Swabs (DROPSAFE ALCOHOL PREP) 70 % PADS USE TO CHECK BLOOD  SUGAR ONCE DAILY 100 each 0   benzonatate (TESSALON) 100 MG capsule Take 1 capsule (100 mg total) by mouth 3 (three) times daily as needed for cough. 30 capsule 0   Blood Glucose Calibration (TRUE METRIX LEVEL 1) Low SOLN USE TO CHECK METER 1 each 0   Blood Glucose Monitoring Suppl (TRUE METRIX METER) w/Device KIT Check blood sugar once daily 1 kit 0   butalbital-acetaminophen-caffeine (FIORICET) 50-325-40 MG tablet TAKE 1 TABLET EVERY 6 HOURS AS NEEDED FOR HEADACHE (Patient taking differently: Take 1 tablet by mouth every 6 (six) hours as needed for migraine or headache.) 10 tablet 3   clopidogrel (PLAVIX) 75 MG tablet TAKE 1 TABLET (75 MG TOTAL) BY MOUTH DAILY. 90 tablet 3   co-enzyme Q-10 30 MG capsule Take 100 mg by  mouth daily.     DUREZOL 0.05 % EMUL Place 1 drop into the left eye 3 (three) times daily.     fluticasone (FLONASE) 50 MCG/ACT nasal spray USE 2 SPRAYS IN EACH NOSTRIL EVERY DAY (Patient taking differently: Place 2 sprays into both nostrils at bedtime.) 48 g 2   ketorolac (ACULAR) 0.5 % ophthalmic solution Place 1 drop into the right eye 4 (four) times daily.     lactase (LACTAID) 3000 units tablet Take 2 tablets (6,000 Units total) by mouth 3 (three) times daily with meals. (Patient taking differently: Take 6,000 Units by mouth See admin instructions. Take 6,000 units (2 tablets) by mouth up to three times a day WITH DAIRY PRODUCTS)     moxifloxacin (VIGAMOX) 0.5 % ophthalmic solution Place 1 drop into the right eye 4 (four) times daily.     Multiple Vitamin (MULTIVITAMIN) tablet Take 1 tablet by mouth at bedtime.     PROLENSA 0.07 % SOLN Place 1 drop into the left eye daily.     promethazine (PHENERGAN) 25 MG tablet Take 0.5-1 tablets (12.5-25 mg total) by mouth 2 (two) times daily as needed for nausea. 20 tablet 0   THERATEARS PF 0.25 % SOLN Place 2 drops into both eyes daily as needed (dry eyes).     topiramate (TOPAMAX) 25 MG tablet Take 1-2 tablets (25-50 mg total) by mouth See admin instructions. Take 25 mg by mouth in the morning and 50 mg at bedtime 270 tablet 4   triamcinolone cream (KENALOG) 0.1 % APPLY 1 APPLICATION TOPICALLY TO AFFECTED AREA TWICE A DAY AS NEEDED FOR RASH 80 g 0   TRUE METRIX BLOOD GLUCOSE TEST test strip CHECK BLOOD SUGAR ONCE DAILY 100 strip 0   TRUEplus Lancets 33G MISC CHECK BLOOD SUGAR ONCE DAILY 100 each 0   Ubrogepant (UBRELVY) 50 MG TABS Take 1 tab at onset of migraine.  May repeat in 2 hrs, if needed.  Max dose: 2 tabs/day. This is a 30 day prescription. 12 tablet 11   vitamin B-12 (CYANOCOBALAMIN) 1000 MCG tablet Take 1 tablet (1,000 mcg total) by mouth every Monday, Wednesday, and Friday.     atorvastatin (LIPITOR) 40 MG tablet TAKE 1 TABLET AT BEDTIME  (Patient taking differently: Take 40 mg by mouth at bedtime.) 90 tablet 1   methocarbamol (ROBAXIN) 500 MG tablet TAKE 1 TABLET (500 MG TOTAL) BY MOUTH 3 (THREE) TIMES DAILY AS NEEDED FOR MUSCLE SPASMS (SEDATION PRECAUTIONS). 60 tablet 1   omeprazole (PRILOSEC) 20 MG capsule TAKE 1 CAPSULE (20 MG TOTAL) BY MOUTH DAILY. 90 capsule 2   No facility-administered medications prior to visit.     Per HPI unless specifically indicated in  ROS section below Review of Systems  Constitutional:  Negative for activity change, appetite change, chills, fatigue, fever and unexpected weight change.  HENT:  Negative for hearing loss.   Eyes:  Negative for visual disturbance.  Respiratory:  Negative for cough, chest tightness, shortness of breath and wheezing.   Cardiovascular:  Negative for chest pain, palpitations and leg swelling.  Gastrointestinal:  Positive for nausea (occ). Negative for abdominal distention, abdominal pain, blood in stool, constipation, diarrhea and vomiting.  Genitourinary:  Negative for difficulty urinating and hematuria.  Musculoskeletal:  Negative for arthralgias, myalgias and neck pain.  Skin:  Negative for rash.  Neurological:  Negative for dizziness, seizures, syncope and headaches.  Hematological:  Negative for adenopathy. Bruises/bleeds easily (to arms).  Psychiatric/Behavioral:  Negative for dysphoric mood. The patient is not nervous/anxious.     Objective:  BP 122/74   Pulse 68   Temp 98 F (36.7 C) (Temporal)   Ht 5' 2.25" (1.581 m)   Wt 168 lb 4 oz (76.3 kg)   SpO2 97%   BMI 30.53 kg/m   Wt Readings from Last 3 Encounters:  01/26/22 168 lb 4 oz (76.3 kg)  10/15/21 166 lb (75.3 kg)  10/13/21 163 lb (73.9 kg)      Physical Exam Vitals and nursing note reviewed.  Constitutional:      Appearance: Normal appearance. She is not ill-appearing.  HENT:     Head: Normocephalic and atraumatic.     Right Ear: Tympanic membrane, ear canal and external ear normal. There  is no impacted cerumen.     Left Ear: Tympanic membrane, ear canal and external ear normal. There is no impacted cerumen.  Eyes:     General:        Right eye: No discharge.        Left eye: No discharge.     Extraocular Movements: Extraocular movements intact.     Conjunctiva/sclera: Conjunctivae normal.     Pupils: Pupils are equal, round, and reactive to light.  Neck:     Thyroid: No thyroid mass or thyromegaly.     Vascular: No carotid bruit.  Cardiovascular:     Rate and Rhythm: Normal rate and regular rhythm.     Pulses: Normal pulses.     Heart sounds: Normal heart sounds. No murmur heard. Pulmonary:     Effort: Pulmonary effort is normal. No respiratory distress.     Breath sounds: Normal breath sounds. No wheezing, rhonchi or rales.  Abdominal:     General: Bowel sounds are normal. There is no distension.     Palpations: Abdomen is soft. There is no mass.     Tenderness: There is no abdominal tenderness. There is no guarding or rebound.     Hernia: No hernia is present.  Musculoskeletal:     Cervical back: Normal range of motion and neck supple. No rigidity.     Right lower leg: No edema.     Left lower leg: No edema.  Lymphadenopathy:     Cervical: No cervical adenopathy.  Skin:    General: Skin is warm and dry.     Findings: No rash.  Neurological:     General: No focal deficit present.     Mental Status: She is alert. Mental status is at baseline.  Psychiatric:        Mood and Affect: Mood normal.        Behavior: Behavior normal.       Results for orders placed or  performed in visit on 01/21/22  Microalbumin / creatinine urine ratio  Result Value Ref Range   Microalb, Ur 3.1 (H) 0.0 - 1.9 mg/dL   Creatinine,U 88.1 mg/dL   Microalb Creat Ratio 3.5 0.0 - 30.0 mg/g  CBC with Differential/Platelet  Result Value Ref Range   WBC 4.4 4.0 - 10.5 K/uL   RBC 3.91 3.87 - 5.11 Mil/uL   Hemoglobin 12.0 12.0 - 15.0 g/dL   HCT 35.8 (L) 36.0 - 46.0 %   MCV 91.6 78.0  - 100.0 fl   MCHC 33.5 30.0 - 36.0 g/dL   RDW 14.0 11.5 - 15.5 %   Platelets 208.0 150.0 - 400.0 K/uL   Neutrophils Relative % 49.5 43.0 - 77.0 %   Lymphocytes Relative 32.2 12.0 - 46.0 %   Monocytes Relative 17.0 (H) 3.0 - 12.0 %   Eosinophils Relative 0.7 0.0 - 5.0 %   Basophils Relative 0.6 0.0 - 3.0 %   Neutro Abs 2.2 1.4 - 7.7 K/uL   Lymphs Abs 1.4 0.7 - 4.0 K/uL   Monocytes Absolute 0.7 0.1 - 1.0 K/uL   Eosinophils Absolute 0.0 0.0 - 0.7 K/uL   Basophils Absolute 0.0 0.0 - 0.1 K/uL  Hemoglobin A1c  Result Value Ref Range   Hgb A1c MFr Bld 6.6 (H) 4.6 - 6.5 %  Comprehensive metabolic panel  Result Value Ref Range   Sodium 144 135 - 145 mEq/L   Potassium 3.7 3.5 - 5.1 mEq/L   Chloride 110 96 - 112 mEq/L   CO2 27 19 - 32 mEq/L   Glucose, Bld 105 (H) 70 - 99 mg/dL   BUN 16 6 - 23 mg/dL   Creatinine, Ser 0.77 0.40 - 1.20 mg/dL   Total Bilirubin 0.6 0.2 - 1.2 mg/dL   Alkaline Phosphatase 70 39 - 117 U/L   AST 15 0 - 37 U/L   ALT 18 0 - 35 U/L   Total Protein 7.1 6.0 - 8.3 g/dL   Albumin 4.1 3.5 - 5.2 g/dL   GFR 75.69 >60.00 mL/min   Calcium 9.0 8.4 - 10.5 mg/dL  Lipid panel  Result Value Ref Range   Cholesterol 137 0 - 200 mg/dL   Triglycerides 78.0 0.0 - 149.0 mg/dL   HDL 49.20 >39.00 mg/dL   VLDL 15.6 0.0 - 40.0 mg/dL   LDL Cholesterol 72 0 - 99 mg/dL   Total CHOL/HDL Ratio 3    NonHDL 88.08     Assessment & Plan:   Problem List Items Addressed This Visit     Health maintenance examination - Primary (Chronic)    Preventative protocols reviewed and updated unless pt declined. Discussed healthy diet and lifestyle.       Hyperlipidemia associated with type 2 diabetes mellitus (HCC)    Chronic, stable on atorvastatin 68m daily - continue. LDL goal <70. The ASCVD Risk score (Arnett DK, et al., 2019) failed to calculate for the following reasons:   The patient has a prior MI or stroke diagnosis       Relevant Medications   atorvastatin (LIPITOR) 40 MG tablet    GERD (gastroesophageal reflux disease)    Notes breakthrough symptoms despite omeprazole 221mdaily so she has been taking 4060maily. Discussed plavix/omeprazole interaction - will instead prescribe pantoprazole 68m49mily.       Relevant Medications   pantoprazole (PROTONIX) 40 MG tablet   Controlled diabetes mellitus type 2 with complications (HCC)    Chronic, stable, diet controlled.  Relevant Medications   atorvastatin (LIPITOR) 40 MG tablet   Hemiparesis affecting right side as late effect of stroke ([place])    Notes persistent R leg weakness since stroke 08/2020, causing imbalance and fear of fall. She previously completed outpatient PT course, interested in another course. She is participating in senior center exercise program in [place].       Relevant Orders   Ambulatory referral to Physical Therapy   Gait abnormality   Relevant Orders   Ambulatory referral to Physical Therapy   History of stroke in adulthood     Meds ordered this encounter  Medications   pantoprazole (PROTONIX) 40 MG tablet    Sig: Take 1 tablet (40 mg total) by mouth daily.    Dispense:  90 tablet    Refill:  3   DISCONTD: pantoprazole (PROTONIX) 40 MG tablet    Sig: Take 1 tablet (40 mg total) by mouth daily.    Dispense:  14 tablet    Refill:  0    Short supply while she gets mail order   DISCONTD: atorvastatin (LIPITOR) 40 MG tablet    Sig: Take 1 tablet (40 mg total) by mouth at bedtime.    Dispense:  90 tablet    Refill:  3   atorvastatin (LIPITOR) 40 MG tablet    Sig: Take 1 tablet (40 mg total) by mouth at bedtime.    Dispense:  90 tablet    Refill:  3   Orders Placed This Encounter  Procedures   Ambulatory referral to Physical Therapy    Referral Priority:   Routine    Referral Type:   Physical Medicine    Referral Reason:   Specialty Services Required    Requested Specialty:   Physical Therapy    Number of Visits Requested:   1    Patient instructions: Labs looking  good today Stop omeprazole given plavix interaction, start pantoprazole 61m daily. Take 30 min before largest meal. Let me know if ongoing reflux despite this.  Return in 6 months for diabetes follow up visit.   Follow up plan: Return in about 6 months (around 07/27/2022) for follow up visit.  JRia Bush MD

## 2022-01-26 NOTE — Patient Instructions (Addendum)
Labs looking good today Stop omeprazole given plavix interaction, start pantoprazole '40mg'$  daily. Take 30 min before largest meal. Let me know if ongoing reflux despite this.  Return in 6 months for diabetes follow up visit.   Health Maintenance After Age 75 After age 30, you are at a higher risk for certain long-term diseases and infections as well as injuries from falls. Falls are a major cause of broken bones and head injuries in people who are older than age 36. Getting regular preventive care can help to keep you healthy and well. Preventive care includes getting regular testing and making lifestyle changes as recommended by your health care provider. Talk with your health care provider about: Which screenings and tests you should have. A screening is a test that checks for a disease when you have no symptoms. A diet and exercise plan that is right for you. What should I know about screenings and tests to prevent falls? Screening and testing are the best ways to find a health problem early. Early diagnosis and treatment give you the best chance of managing medical conditions that are common after age 63. Certain conditions and lifestyle choices may make you more likely to have a fall. Your health care provider may recommend: Regular vision checks. Poor vision and conditions such as cataracts can make you more likely to have a fall. If you wear glasses, make sure to get your prescription updated if your vision changes. Medicine review. Work with your health care provider to regularly review all of the medicines you are taking, including over-the-counter medicines. Ask your health care provider about any side effects that may make you more likely to have a fall. Tell your health care provider if any medicines that you take make you feel dizzy or sleepy. Strength and balance checks. Your health care provider may recommend certain tests to check your strength and balance while standing, walking, or  changing positions. Foot health exam. Foot pain and numbness, as well as not wearing proper footwear, can make you more likely to have a fall. Screenings, including: Osteoporosis screening. Osteoporosis is a condition that causes the bones to get weaker and break more easily. Blood pressure screening. Blood pressure changes and medicines to control blood pressure can make you feel dizzy. Depression screening. You may be more likely to have a fall if you have a fear of falling, feel depressed, or feel unable to do activities that you used to do. Alcohol use screening. Using too much alcohol can affect your balance and may make you more likely to have a fall. Follow these instructions at home: Lifestyle Do not drink alcohol if: Your health care provider tells you not to drink. If you drink alcohol: Limit how much you have to: 0-1 drink a day for women. 0-2 drinks a day for men. Know how much alcohol is in your drink. In the [place] one drink equals one 12 oz bottle of beer (355 mL), one 5 oz glass of wine (148 mL), or one 1 oz glass of hard liquor (44 mL). Do not use any products that contain nicotine or tobacco. These products include cigarettes, chewing tobacco, and vaping devices, such as e-cigarettes. If you need help quitting, ask your health care provider. Activity  Follow a regular exercise program to stay fit. This will help you maintain your balance. Ask your health care provider what types of exercise are appropriate for you. If you need a cane or walker, use it as recommended by your health  care provider. Wear supportive shoes that have nonskid soles. Safety  Remove any tripping hazards, such as rugs, cords, and clutter. Install safety equipment such as grab bars in bathrooms and safety rails on stairs. Keep rooms and walkways well-lit. General instructions Talk with your health care provider about your risks for falling. Tell your health care provider if: You fall. Be sure to  tell your health care provider about all falls, even ones that seem minor. You feel dizzy, tiredness (fatigue), or off-balance. Take over-the-counter and prescription medicines only as told by your health care provider. These include supplements. Eat a healthy diet and maintain a healthy weight. A healthy diet includes low-fat dairy products, low-fat (lean) meats, and fiber from whole grains, beans, and lots of fruits and vegetables. Stay current with your vaccines. Schedule regular health, dental, and eye exams. Summary Having a healthy lifestyle and getting preventive care can help to protect your health and wellness after age 40. Screening and testing are the best way to find a health problem early and help you avoid having a fall. Early diagnosis and treatment give you the best chance for managing medical conditions that are more common for people who are older than age 57. Falls are a major cause of broken bones and head injuries in people who are older than age 86. Take precautions to prevent a fall at home. Work with your health care provider to learn what changes you can make to improve your health and wellness and to prevent falls. This information is not intended to replace advice given to you by your health care provider. Make sure you discuss any questions you have with your health care provider. Document Revised: 09/14/2020 Document Reviewed: 09/14/2020 Elsevier Patient Education  [place].

## 2022-01-27 DIAGNOSIS — Z8673 Personal history of transient ischemic attack (TIA), and cerebral infarction without residual deficits: Secondary | ICD-10-CM | POA: Insufficient documentation

## 2022-01-27 MED ORDER — ATORVASTATIN CALCIUM 40 MG PO TABS: 40.0000 mg | ORAL_TABLET | Freq: Every day | ORAL | 3 refills | Status: DC

## 2022-01-27 NOTE — Assessment & Plan Note (Signed)
Chronic, stable, diet controlled.  

## 2022-01-27 NOTE — Assessment & Plan Note (Signed)
Notes breakthrough symptoms despite omeprazole '20mg'$  daily so she has been taking '40mg'$  daily. Discussed plavix/omeprazole interaction - will instead prescribe pantoprazole '40mg'$  daily.

## 2022-01-27 NOTE — Assessment & Plan Note (Signed)
Notes persistent R leg weakness since stroke 08/2020, causing imbalance and fear of fall. She previously completed outpatient PT course, interested in another course. She is participating in senior center exercise program in [place].

## 2022-01-27 NOTE — Assessment & Plan Note (Signed)
Chronic, stable on atorvastatin '40mg'$  daily - continue. LDL goal <70. The ASCVD Risk score (Arnett DK, et al., 2019) failed to calculate for the following reasons:   The patient has a prior MI or stroke diagnosis

## 2022-01-27 NOTE — Assessment & Plan Note (Signed)
Preventative protocols reviewed and updated unless pt declined. Discussed healthy diet and lifestyle.  

## 2022-02-01 ENCOUNTER — Telehealth: Payer: Self-pay | Admitting: Family Medicine

## 2022-02-01 NOTE — Telephone Encounter (Signed)
Pt states during her last ov, Dr. Darnell Level changed the following meds: methocarbamol (ROBAXIN) 500 MG tablet  To another medication (doesn't know the name). Pt states due to the side effects of the new medication, she doesn't want to start meds. Call back # [phone]

## 2022-02-01 NOTE — Telephone Encounter (Signed)
Spoke with pt asking what medication and what side effects is she having.  Pt clarifies the old med was omeprazole and new med is pantoprazole, which is what she is having trouble with.  C/o fatigue, weakness, diarrhea, abd pain and trouble swallowing. States she did not take any today and is feeling a little better.  Advised pt to stop med until further instructions from Dr. Darnell Level.  Pt verbalizes understanding.  Plz advise.

## 2022-02-02 MED ORDER — LANSOPRAZOLE 30 MG PO CPDR: 30.0000 mg | DELAYED_RELEASE_CAPSULE | Freq: Every day | ORAL | 3 refills | Status: DC

## 2022-02-02 NOTE — Telephone Encounter (Signed)
Spoke with pt relaying Dr. G's message. Pt verbalizes understanding.  

## 2022-02-02 NOTE — Addendum Note (Signed)
Addended by: [name] on: 02/02/2022 08:07 AM   Modules accepted: Orders

## 2022-02-02 NOTE — Telephone Encounter (Signed)
Stay off pantoprazole.  Trial prevacid (lansoprazole) sent 58mosupply to local pharmacy.  Update u[place] effect.

## 2022-02-15 ENCOUNTER — Telehealth: Payer: Self-pay | Admitting: Family Medicine

## 2022-02-15 DIAGNOSIS — R269 Unspecified abnormalities of gait and mobility: Secondary | ICD-10-CM

## 2022-02-15 DIAGNOSIS — I69351 Hemiplegia and hemiparesis following cerebral infarction affecting right dominant side: Secondary | ICD-10-CM

## 2022-02-15 NOTE — Telephone Encounter (Signed)
Ok to do. New referral placed.

## 2022-02-15 NOTE — Telephone Encounter (Signed)
Pt stated she was referred by Dr. Darnell Level for physical therapist but the dr she was referred to charges $20 a visit & is further away in distance. Pt was wondering if she could get referred to [facility], they charge $10 for each visit & its closer for traveling. Call back # [phone]

## 2022-02-15 NOTE — Telephone Encounter (Signed)
Spoke to pt. Advised her to contact the [facility] or our office if she does not hear from anyone by next week sometime.

## 2022-02-18 NOTE — Telephone Encounter (Signed)
See referral notes.   Referral faxed to [facility] 02/17/2022

## 2022-03-04 DIAGNOSIS — R2689 Other abnormalities of gait and mobility: Secondary | ICD-10-CM | POA: Diagnosis not present

## 2022-03-07 ENCOUNTER — Telehealth: Payer: Self-pay

## 2022-03-07 DIAGNOSIS — R2689 Other abnormalities of gait and mobility: Secondary | ICD-10-CM | POA: Diagnosis not present

## 2022-03-07 MED ORDER — LANSOPRAZOLE 30 MG PO CPDR: 30.0000 mg | DELAYED_RELEASE_CAPSULE | Freq: Every day | ORAL | 0 refills | Status: DC

## 2022-03-07 NOTE — Telephone Encounter (Signed)
Dana [name] - Client TELEPHONE ADVICE RECORD [facility] Patient Name: Dana [name] Gender: Female DOB: 04/09/1946 Age: 76 Y 19 M 10 D Return Phone Number: [phone] (Primary), [phone] (Secondary) Address: City/ [place]  [address] Client [place] [name] - Client Client Site [place] Provider [name] - MD Contact Type Call Who Is Calling Patient / Member / Family / Caregiver Call Type Triage / Clinical Relationship To Patient Self Return Phone Number [phone] (Primary) Chief Complaint Prescription Refill or Medication Request (non symptomatic) Reason for Call Symptomatic / Request for [place] states she needs a refill for Lansotrazole. No current symptoms. Translation No Nurse Assessment Nurse: Durene Cal, RN, Brandi Date/Time (Eastern Time): 03/05/2022 7:03:28 PM Please select the assessment type ---Refill Additional Documentation ---Lansoprazole '30mg'$  one tab po daily Allergy: niacin , Septra, ibuprofen, tramadol PRIMARY Pharmacy: [facility] in [place] - called in here last, but they care closed until Monday. PT [facility] [place] ([place]) *pt plans to take prevacid over the weekend and will call the office MONDAY AM> Does the patient have enough medication to last until the office opens? ---Unable to obtain loaner dose from Pharmacy Does the client directives allow for assistance with medications after hours? ---No Additional Documentation ---no ON CALL FOR RX AFTER HOURS. Nurse: Durene Cal, RN, Brandi Date/Time (Eastern Time): 03/05/2022 7:03:09 PM Confirm and document reason for call. If symptomatic, describe symptoms. ---Caller states she needs a refill for Lansoprazole. No current symptoms. Does  the patient have any new or worsening symptoms? ---No Please document clinical information provided and list any resource used. ---Denies any new or worsening symptoms at this time, but agrees to call with any changes. Usually without rx state symptoms get "pretty bad". PLEASE NOTE: All timestamps contained within this report are represented as [place] Standard Time. CONFIDENTIALTY NOTICE: This fax transmission is intended only for the addressee. It contains information that is legally privileged, confidential or otherwise protected from use or disclosure. If you are not the intended recipient, you are strictly prohibited from reviewing, disclosing, copying using or disseminating any of this information or taking any action in reliance on or regarding this information. If you have received this fax in error, please notify us immediately by telephone so that we can arrange for its return to [place]. Phone: [phone], Toll-Free: [phone], Fax: [phone] Page: 2 of 2 Call Id: [id number] [place]. Time Eilene Ghazi Time) Disposition Final User 03/05/2022 6:35:17 PM Send To Nurse Krista Blue, RN, Barnetta Chapel 03/05/2022 7:12:49 PM Pharmacy Call Durene Cal, RN, Brandi Reason: Refill: out of med. 03/05/2022 7:12:58 PM Clinical Call Yes Durene Cal, RN, Brandi Final Disposition 03/05/2022 7:12:58 PM Clinical Call Yes Durene Cal, RN, Brandi Referrals REFERRED TO PCP OFFIC

## 2022-03-07 NOTE — Telephone Encounter (Signed)
Sending note to [place] pool.

## 2022-03-07 NOTE — Telephone Encounter (Signed)
Pt here in office today with husband.    Spoke with pt to get clarification of message.  Pt states she no longer uses [facility] due to moving to [place].  States new local pharmacy is [facility], [address].  But she requests 90-day rx be sent to [facility] order pharmacy.  Updated pt's pharmacy list.  E-scribed refill.

## 2022-03-07 NOTE — Telephone Encounter (Signed)
[place] [facility] - Client TELEPHONE ADVICE RECORD [facility] Patient Name: Dana [name] NA [place] Gender: Female DOB: 17-Mar-1946 Age: 76 Y 52 M 10 D Return Phone Number: [phone] (Primary), [phone] (Secondary) Address: City/ State/ [place]  [address] Client [place] [facility] - Client Client Site [place] Provider [name] - MD Contact Type Call Who Is Calling Patient / Member / Family / Caregiver Call Type Triage / Clinical Relationship To Patient Self Return Phone Number [phone] (Primary) Chief Complaint Prescription Refill or Medication Request (non symptomatic) Reason for Call Medication Question / Request Initial Comment Caller states has ran out medication. Does not have enough for the weekend. Pharmacy [facility], will not dispense any with out Dr's okay. Translation No Nurse Assessment Nurse: Thad Ranger, RN, Langley Gauss Date/Time (Eastern Time): 03/05/2022 2:33:25 PM Please select the assessment type ---Refill Additional Documentation ---Pt is out of Lansoprazole. Denies new/worsening s/s. Does the patient have enough medication to last until the office opens? ---No Additional Documentation ---Advised to call the pharm for loaner dose then call the [facility] on Mon for refill. Disp. Time Eilene Ghazi Time) Disposition Final User 03/05/2022 2:11:50 PM Send To Nurse Krista Blue, RN, Barnetta Chapel 03/05/2022 2:36:00 PM Clinical Call Yes Thad Ranger, RN, Langley Gauss Final Disposition 03/05/2022 2:36:00 PM Clinical Call Yes Thad Ranger, RN, Sharla Kidney

## 2022-03-09 DIAGNOSIS — R2689 Other abnormalities of gait and mobility: Secondary | ICD-10-CM | POA: Diagnosis not present

## 2022-03-10 ENCOUNTER — Other Ambulatory Visit: Payer: Self-pay | Admitting: Family Medicine

## 2022-03-10 DIAGNOSIS — E118 Type 2 diabetes mellitus with unspecified complications: Secondary | ICD-10-CM

## 2022-03-14 ENCOUNTER — Other Ambulatory Visit: Payer: Self-pay | Admitting: Family Medicine

## 2022-03-14 DIAGNOSIS — L282 Other prurigo: Secondary | ICD-10-CM

## 2022-03-14 NOTE — Telephone Encounter (Signed)
Refill request triamcinolone Last refill 01/19/22  80 G  Last office visit 01/26/22

## 2022-03-16 DIAGNOSIS — R2689 Other abnormalities of gait and mobility: Secondary | ICD-10-CM | POA: Diagnosis not present

## 2022-03-21 DIAGNOSIS — R2689 Other abnormalities of gait and mobility: Secondary | ICD-10-CM | POA: Diagnosis not present

## 2022-03-23 DIAGNOSIS — R2689 Other abnormalities of gait and mobility: Secondary | ICD-10-CM | POA: Diagnosis not present

## 2022-03-29 ENCOUNTER — Ambulatory Visit: Payer: Medicare HMO | Admitting: Neurology

## 2022-03-29 DIAGNOSIS — R2689 Other abnormalities of gait and mobility: Secondary | ICD-10-CM | POA: Diagnosis not present

## 2022-04-04 DIAGNOSIS — R2689 Other abnormalities of gait and mobility: Secondary | ICD-10-CM | POA: Diagnosis not present

## 2022-04-06 DIAGNOSIS — R2689 Other abnormalities of gait and mobility: Secondary | ICD-10-CM | POA: Diagnosis not present

## 2022-04-18 DIAGNOSIS — R2689 Other abnormalities of gait and mobility: Secondary | ICD-10-CM | POA: Diagnosis not present

## 2022-04-21 DIAGNOSIS — R2689 Other abnormalities of gait and mobility: Secondary | ICD-10-CM | POA: Diagnosis not present

## 2022-04-23 DIAGNOSIS — Z882 Allergy status to sulfonamides status: Secondary | ICD-10-CM | POA: Diagnosis not present

## 2022-04-23 DIAGNOSIS — U071 COVID-19: Secondary | ICD-10-CM | POA: Diagnosis not present

## 2022-04-23 DIAGNOSIS — R0602 Shortness of breath: Secondary | ICD-10-CM | POA: Diagnosis not present

## 2022-04-23 DIAGNOSIS — E119 Type 2 diabetes mellitus without complications: Secondary | ICD-10-CM | POA: Diagnosis not present

## 2022-04-23 DIAGNOSIS — Z888 Allergy status to other drugs, medicaments and biological substances status: Secondary | ICD-10-CM | POA: Diagnosis not present

## 2022-04-23 DIAGNOSIS — Z91040 Latex allergy status: Secondary | ICD-10-CM | POA: Diagnosis not present

## 2022-04-23 DIAGNOSIS — Z7902 Long term (current) use of antithrombotics/antiplatelets: Secondary | ICD-10-CM | POA: Diagnosis not present

## 2022-04-25 ENCOUNTER — Telehealth: Payer: Self-pay | Admitting: Family Medicine

## 2022-04-25 NOTE — Telephone Encounter (Signed)
Per chart review tab pt was seen [facility] on 04/23/22. Sending note to Dr Darnell Level and G pool.   [place] [facility] - Client TELEPHONE ADVICE RECORD [facility] Patient Name: Dana [name] [place] Gender: Female DOB: 26-Mar-1946 Age: 76 Y 2 M 28 D Return Phone Number: [phone] (Primary), [phone] (Secondary) Address: City/ State/ [place]  [address] Client [place] [facility] - Client Client Site [place] Provider [name] - MD Contact Type Call Who Is Calling Patient / Member / Family / Caregiver Call Type Triage / Clinical Relationship To Patient Self Return Phone Number [phone] (Primary) Chief Complaint Headache Reason for Call Symptomatic / Request for [place] states she tested positive for COVID. She has a stuffy nose, headache, eye discharge, and a sore throat. [facility] Translation No Nurse Assessment Nurse: Cherre Robins, RN, Ria Comment Date/Time Eilene Ghazi Time): 04/23/2022 4:17:55 PM Confirm and document reason for call. If symptomatic, describe symptoms. ---Caller states she tested positive for COVID today via home test. C/o stuffy nose, headache, watery eyes, and a sore throat. No fever. Sx start last night Does the patient have any new or worsening symptoms? ---Yes Will a triage be completed? ---Yes Related visit to physician within the last 2 weeks? ---No Does the PT have any chronic conditions? (i.e. diabetes, asthma, this includes High risk factors for pregnancy, etc.) ---Yes List chronic conditions. ---diabetes, high cholesterol, blood thinners due to hx of stroke. Is this a behavioral health or substance abuse call? ---No Guidelines Guideline Title Affirmed Question Affirmed Notes Nurse Date/Time (Eastern Time) COVID-19 - Diagnosed or Suspected MODERATE difficulty breathing (e.g.,  speaks in phrases, SOB even at rest, pulse 100-120) Weiss-Hilton, RN, Ria Comment 04/23/2022 4:19:27 PM PLEASE NOTE: All timestamps contained within this report are represented as [place] Standard Time. CONFIDENTIALTY NOTICE: This fax transmission is intended only for the addressee. It contains information that is legally privileged, confidential or otherwise protected from use or disclosure. If you are not the intended recipient, you are strictly prohibited from reviewing, disclosing, copying using or disseminating any of this information or taking any action in reliance on or regarding this information. If you have received this fax in error, please notify us immediately by telephone so that we can arrange for its return to [place]. Phone: [phone], Toll-Free: [phone], Fax: [phone] Page: 2 of 2 Call Id: [id number] [place]. Time Eilene Ghazi Time) Disposition Final User 04/23/2022 4:21:33 PM Go to ED Now Yes Weiss-Hilton, RN, Ria Comment Final Disposition 04/23/2022 4:21:33 PM Go to ED Now Yes Weiss-Hilton, RN, Ivonne Andrew Disagree/Comply Comply Caller Understands Yes PreDisposition InappropriateToAsk Care Advice Given Per Guideline GO TO ED NOW: * You need to be seen in the Emergency Department. * Go to the ED at ___________ [place] now. Drive carefully. ANOTHER ADULT SHOULD DRIVE: * It is better and safer if another adult drives instead of you. WEAR A MASK - COVER YOUR MOUTH AND NOSE: * Wear a mask that fits snuggly over your mouth and nose. CALL EMS 911 IF: * Severe difficulty breathing occurs * Lips or face turns blue * Confusion occurs. CARE ADVICE given per COVID-19 - DIAGNOSED OR SUSPECTED (Adult) guideline. Referrals GO TO FACILITY OTHER - SPECIF

## 2022-04-25 NOTE — Telephone Encounter (Signed)
Patient requesting medication for her cough Seen in ED on Saturday  Taking Paxlovid  Would like to know if she can take  benzonatate (TESSALON) 100 MG capsule  (she has some left from last year when it was prescribed  Or tylenol sinus or something else  Patient would like a call, [phone] on Dr. Josefine Class schedule for a video visit on Tuesday 12.19 @ 9:30am (does patient still need this appointment)

## 2022-04-26 ENCOUNTER — Telehealth (INDEPENDENT_AMBULATORY_CARE_PROVIDER_SITE_OTHER): Payer: Medicare HMO | Admitting: Family Medicine

## 2022-04-26 ENCOUNTER — Telehealth: Payer: Self-pay | Admitting: Family Medicine

## 2022-04-26 DIAGNOSIS — U071 COVID-19: Secondary | ICD-10-CM | POA: Diagnosis not present

## 2022-04-26 MED ORDER — BENZONATATE 200 MG PO CAPS: 200.0000 mg | ORAL_CAPSULE | Freq: Three times a day (TID) | ORAL | 1 refills | Status: DC | PRN

## 2022-04-26 MED ORDER — NIRMATRELVIR/RITONAVIR (PAXLOVID)TABLET: 3.0000 | ORAL_TABLET | Freq: Two times a day (BID) | ORAL | Status: AC

## 2022-04-26 MED ORDER — ONDANSETRON HCL 4 MG PO TABS: 4.0000 mg | ORAL_TABLET | Freq: Three times a day (TID) | ORAL | 0 refills | Status: DC | PRN

## 2022-04-26 NOTE — Telephone Encounter (Signed)
Seen virtually today.

## 2022-04-26 NOTE — Telephone Encounter (Signed)
Patient notified of prescription sent to pharmacy.

## 2022-04-26 NOTE — Progress Notes (Unsigned)
Virtual visit completed through WebEx or similar program Patient location: home  Provider location: [facility] at [facility], office  Participants: Patient and me (unless stated otherwise below)  Limitations and rationale for visit method d/w patient.  Patient agreed to proceed.   CC: covid  [id number] has been having cough, sore throat, HA, body aches, diarrhea, hoarseness and fatigue since Friday night. Tested positive for covid on Saturday evening. Went to ER Saturday afternoon and was given paxlovid and inhaler; took perles last night for cough.   CXR at ER Impression: IMPRESSION: No active disease.  ER course d/w pt.  She feels better in the meantime.  Routine cautions d/w pt, re: isolation and masking.  L chest wall is sore, likely from coughing, "like I pulled a muscle."  No new rash.  No fevers.  Some diarrhea yesterday but not today.  No vomiting.  Taking tessalon for cough.    Meds and allergies reviewed.   ROS: Per HPI unless specifically indicated in ROS section   NAD Speech wnl Speaking in complete sentences.    A/P: Covid.  Finish paxlovid, rest and fluids.  Tessalon prn for cough. Update [place] as needed.

## 2022-04-26 NOTE — Addendum Note (Signed)
Addended by: Tonia Ghent on: 04/26/2022 03:08 PM   Modules accepted: Orders

## 2022-04-26 NOTE — Telephone Encounter (Signed)
Would try zofran prn.  Rx sent.  Thanks.

## 2022-04-26 NOTE — Telephone Encounter (Signed)
Patient stated can she get some medication for nausea and she had an appointment today. Call back number [phone].

## 2022-04-27 ENCOUNTER — Telehealth: Payer: Self-pay

## 2022-04-27 DIAGNOSIS — U071 COVID-19: Secondary | ICD-10-CM

## 2022-04-27 HISTORY — DX: COVID-19: U07.1

## 2022-04-27 NOTE — Assessment & Plan Note (Signed)
Covid.  Discussed options.  Finish paxlovid, rest and fluids.  Tessalon prn for cough. Update [place] as needed.  Okay for outpatient follow-up.  She agrees to plan.

## 2022-04-27 NOTE — Progress Notes (Addendum)
Chronic Care Management Pharmacy Assistant   Name: Dana [name]  MRN: [id number] DOB: 04-Dec-1945  Reason for Encounter: Non-CCM [facility] [place] Follow-Up)  Medications: Outpatient Encounter Medications as of 04/27/2022  Medication Sig Note   acetaminophen (TYLENOL) 325 MG tablet Take 1 tablet (325 mg total) by mouth every 6 (six) hours as needed for mild pain. 10/02/2020: ALL MEDICATIONS MUST BE TAKEN WITH APPLESAUCE   Alcohol Swabs (DROPSAFE ALCOHOL PREP) 70 % PADS USE TO TEST BLOOD SUGAR ONCE DAILY (NEED MD APPOINTMENT)    atorvastatin (LIPITOR) 40 MG tablet Take 1 tablet (40 mg total) by mouth at bedtime.    benzonatate (TESSALON) 200 MG capsule Take 1 capsule (200 mg total) by mouth 3 (three) times daily as needed for cough.    Blood Glucose Calibration (TRUE METRIX LEVEL 1) Low SOLN USE TO CHECK METER    Blood Glucose Monitoring Suppl (TRUE METRIX METER) w/Device KIT Check blood sugar once daily    butalbital-acetaminophen-caffeine (FIORICET) 50-325-40 MG tablet TAKE 1 TABLET EVERY 6 HOURS AS NEEDED FOR HEADACHE (Patient taking differently: Take 1 tablet by mouth every 6 (six) hours as needed for migraine or headache.)    clopidogrel (PLAVIX) 75 MG tablet TAKE 1 TABLET (75 MG TOTAL) BY MOUTH DAILY.    co-enzyme Q-10 30 MG capsule Take 100 mg by mouth daily. 10/02/2020: ALL MEDICATIONS MUST BE TAKEN WITH APPLESAUCE   fluticasone (FLONASE) 50 MCG/ACT nasal spray USE 2 SPRAYS IN EACH NOSTRIL EVERY DAY    glucose blood (TRUE METRIX BLOOD GLUCOSE TEST) test strip TEST BLOOD SUGAR ONE TIME DAILY    lactase (LACTAID) 3000 units tablet Take 2 tablets (6,000 Units total) by mouth 3 (three) times daily with meals. (Patient taking differently: Take 6,000 Units by mouth See admin instructions. Take 6,000 units (2 tablets) by mouth up to three times a day WITH DAIRY PRODUCTS) 10/02/2020: ALL MEDICATIONS MUST BE TAKEN WITH APPLESAUCE   lansoprazole (PREVACID) 30 MG capsule Take 1 capsule (30 mg total) by  mouth daily at 12 noon.    methocarbamol (ROBAXIN) 500 MG tablet TAKE 1 TABLET THREE TIMES DAILY AS NEEDED FOR MUSCLE SPASMS (SEDATION PRECAUTIONS). (Patient not taking: Reported on 04/26/2022)    Multiple Vitamin (MULTIVITAMIN) tablet Take 1 tablet by mouth at bedtime. 10/02/2020: ALL MEDICATIONS MUST BE TAKEN WITH APPLESAUCE   nirmatrelvir/ritonavir EUA (PAXLOVID) 20 x 150 MG & 10 x 100MG TABS Take 3 tablets by mouth 2 (two) times daily for 5 days.    ondansetron (ZOFRAN) 4 MG tablet Take 1 tablet (4 mg total) by mouth every 8 (eight) hours as needed for nausea or vomiting.    THERATEARS PF 0.25 % SOLN Place 2 drops into both eyes daily as needed (dry eyes).    topiramate (TOPAMAX) 25 MG tablet Take 1-2 tablets (25-50 mg total) by mouth See admin instructions. Take 25 mg by mouth in the morning and 50 mg at bedtime    triamcinolone cream (KENALOG) 0.1 % APPLY 1 APPLICATION TOPICALLY TO AFFECTED AREA TWICE A DAY AS NEEDED FOR RASH    TRUEplus Lancets 33G MISC TEST BLOOD SUGAR ONCE DAILY (NEED MD APPOINTMENT)    Ubrogepant (UBRELVY) 50 MG TABS Take 1 tab at onset of migraine.  May repeat in 2 hrs, if needed.  Max dose: 2 tabs/day. This is a 30 day prescription. 08/22/2021: Has not had to use this medication, keeps it on hand.    vitamin B-12 (CYANOCOBALAMIN) 1000 MCG tablet Take 1 tablet (1,000 mcg total)  by mouth every Monday, Wednesday, and Friday. 10/02/2020: ALL MEDICATIONS MUST BE TAKEN WITH APPLESAUCE   No facility-administered encounter medications on file as of 04/27/2022.    Reviewed hospital notes for details of recent visit. Has patient been contacted by Transitions of Care team? No Has patient seen PCP/specialist for hospital follow up (summarize OV if yes): Yes 04/26/22 Elsie Stain, MD Video Visit Covid Positive Start: nirmatrelvir/ritonavir EUA (PAXLOVID) 20 x 150 MG & 10 x 100MG TABS Change: Benzonatate 200 mg Stop (completed): PROLENSA 0.07 % SOLN Stop (completed): DUREZOL 0.05 %  EMUL Stop (Completed): ketorolac (ACULAR) 0.5 % ophthalmic solution Stop (completed): moxifloxacin (VIGAMOX) 0.5 % ophthalmic solution Stop (completed): promethazine (PHENERGAN) 25 MG tablet   Admitted to the ED on 04/23/2022. Discharge date was 04/23/2022.  Discharged from [facility].   Discharge diagnosis (Principal Problem): Covid-19 Patient was discharged to Home  Brief summary of hospital course: Shob, cough since yesterday. Tested positive for covid earlier this afternoon and sent to the hospital by pcp for shob.   Labs and chest x-ray are negative for acute abnormalities. Discussed test results with patient. Prescribed Paxlovid and ProAir inhaler. Instructed to follow-up with PCP for continued symptoms. Instructed to return to the ER for any worsening symptoms. Patient verbalized understanding. Improved and stable at discharge. She is nontoxic-appearing, vital signs are stable, pulse oxing 98% on room air, tolerating oral fluid and food intake and in no distress.   New?Medications Started at [facility] Discharge:?? -Started nirmatrelvir - ritonavir (PAXLOVID) 150 mg  -Started albuterol sulfate HFA (PROVENTIL,VENTOLIN,PROAIR) 108 (90 Base) MCG/ACT inhaler   -Started nirmatrelvir - ritonavir (PAXLOVID) 150 mg - 100 mg tablet RENAL pack    Medications that remain the same after Hospital Discharge:??  -All other medications will remain the same.    Next CCM appt: Non-CCM  Other upcoming appts: No appointments scheduled within the next 30 days.  Charlene Brooke, PharmD notified and will determine if action is needed.  Marijean Niemann, [place] Pharmacy Assistant [phone]   Pharmacist addendum: Pt has had follow up with PCP. PharmD intervention is not warranted.  Charlene Brooke, PharmD, BCACP 05/06/22 12:05 PM

## 2022-05-05 DIAGNOSIS — R2689 Other abnormalities of gait and mobility: Secondary | ICD-10-CM | POA: Diagnosis not present

## 2022-05-06 ENCOUNTER — Encounter: Payer: Self-pay | Admitting: Family Medicine

## 2022-05-06 ENCOUNTER — Ambulatory Visit (INDEPENDENT_AMBULATORY_CARE_PROVIDER_SITE_OTHER): Payer: Medicare HMO | Admitting: Family Medicine

## 2022-05-06 VITALS — BP 136/70 | HR 66 | Temp 97.6°F | Ht 62.25 in | Wt 169.1 lb

## 2022-05-06 DIAGNOSIS — U071 COVID-19: Secondary | ICD-10-CM

## 2022-05-06 DIAGNOSIS — J0110 Acute frontal sinusitis, unspecified: Secondary | ICD-10-CM | POA: Diagnosis not present

## 2022-05-06 MED ORDER — AMOXICILLIN 875 MG PO TABS: 875.0000 mg | ORAL_TABLET | Freq: Two times a day (BID) | ORAL | 0 refills | Status: AC

## 2022-05-06 NOTE — Assessment & Plan Note (Addendum)
Anticipate has developed R frontal sinusitis after initial COVID infection given R frontal pain, worsened nasal congestion, loss of taste. Vitals reassuring today.  Rx amoxicillin '875mg'$  BID 10d course.  Further supportive measures as per instructions. Discussed caution with dayquil and tylenol sinus relief as both have similar ingredients.  Update if not improving with treatment.

## 2022-05-06 NOTE — Assessment & Plan Note (Signed)
COVID infection 2 wks ago treated with Paxlovid by [facility] ER.  Doubt COVID relapse as no fevers/chills, cough is improved.

## 2022-05-06 NOTE — Patient Instructions (Signed)
I think you may have a bacterial sinus infection after COVID infection. Take amoxicillin '875mg'$  twice daily for 7-10 days depending on symptom imrovement.  Push fluids and rest. May continue nyquil, albuterol as needed, tessalon perls.  Let us know if not improving each day or if you develop fever >101, worsening productive cough or worsening symptoms in general.

## 2022-05-06 NOTE — Progress Notes (Signed)
Patient ID: Dana [name], female    DOB: February 03, 1946, 76 y.o.   MRN: [id number]  This visit was conducted in person.  BP 136/70   Pulse 66   Temp 97.6 F (36.4 C) (Temporal)   Ht 5' 2.25" (1.581 m)   Wt 169 lb 2 oz (76.7 kg)   SpO2 98%   BMI 30.69 kg/m    CC: cough, congestion, HA post-COVID  Subjective:   HPI: Dana [name] is a 76 y.o. female presenting on 05/06/2022 for Cough (C/o ongoing cough, nasal congestion and HA post-Covid. Pt accompanied by husband, Dana [name]. )   COVID infection diagnosed at [facility] ER 04/22/2022 treated with paxlovid and tessalon perls with albuterol inhaler. CXR was normal. Seen virtually with nausea treated with zofran. Coughing to the point of left rib pain from possible strain - that has since resolved.   Notes symptoms initially were improving, but then developed rhinorrhea, nasal congestion, [name] congestion and headache, having coughing fits. Headache described as R frontal burning pain. Staying fatigued.  She's lost sense of taste over the past week.   No fevers/chills, body aches, dyspnea.   She's continued using albuterol inhaler, tessalon prels, benadryl, tylenol sinus severe (acetaminophen, phenylephrine, guaifenesin) and dayquil (acetaminophen, guaifenesin, phenylephrine, dextromethorphan)/nyquil. Discussed medications taken to date.      Relevant past medical, surgical, family and social history reviewed and updated as indicated. Interim medical history since our last visit reviewed. Allergies and medications reviewed and updated. Outpatient Medications Prior to Visit  Medication Sig Dispense Refill   acetaminophen (TYLENOL) 325 MG tablet Take 1 tablet (325 mg total) by mouth every 6 (six) hours as needed for mild pain.     Alcohol Swabs (DROPSAFE ALCOHOL PREP) 70 % PADS USE TO TEST BLOOD SUGAR ONCE DAILY (NEED MD APPOINTMENT) 100 each 3   atorvastatin (LIPITOR) 40 MG tablet Take 1 tablet (40 mg total) by mouth at bedtime. 90  tablet 3   benzonatate (TESSALON) 200 MG capsule Take 1 capsule (200 mg total) by mouth 3 (three) times daily as needed for cough. 30 capsule 1   Blood Glucose Calibration (TRUE METRIX LEVEL 1) Low SOLN USE TO CHECK METER 1 each 0   Blood Glucose Monitoring Suppl (TRUE METRIX METER) w/Device KIT Check blood sugar once daily 1 kit 0   butalbital-acetaminophen-caffeine (FIORICET) 50-325-40 MG tablet TAKE 1 TABLET EVERY 6 HOURS AS NEEDED FOR HEADACHE (Patient taking differently: Take 1 tablet by mouth every 6 (six) hours as needed for migraine or headache.) 10 tablet 3   clopidogrel (PLAVIX) 75 MG tablet TAKE 1 TABLET (75 MG TOTAL) BY MOUTH DAILY. 90 tablet 3   co-enzyme Q-10 30 MG capsule Take 100 mg by mouth daily.     fluticasone (FLONASE) 50 MCG/ACT nasal spray USE 2 SPRAYS IN EACH NOSTRIL EVERY DAY 48 g 3   glucose blood (TRUE METRIX BLOOD GLUCOSE TEST) test strip TEST BLOOD SUGAR ONE TIME DAILY 100 strip 3   lactase (LACTAID) 3000 units tablet Take 2 tablets (6,000 Units total) by mouth 3 (three) times daily with meals. (Patient taking differently: Take 6,000 Units by mouth See admin instructions. Take 6,000 units (2 tablets) by mouth up to three times a day WITH DAIRY PRODUCTS)     lansoprazole (PREVACID) 30 MG capsule Take 1 capsule (30 mg total) by mouth daily at 12 noon. 90 capsule 0   methocarbamol (ROBAXIN) 500 MG tablet TAKE 1 TABLET THREE TIMES DAILY AS NEEDED FOR MUSCLE SPASMS (SEDATION  PRECAUTIONS). 60 tablet 1   Multiple Vitamin (MULTIVITAMIN) tablet Take 1 tablet by mouth at bedtime.     ondansetron (ZOFRAN) 4 MG tablet Take 1 tablet (4 mg total) by mouth every 8 (eight) hours as needed for nausea or vomiting. 20 tablet 0   topiramate (TOPAMAX) 25 MG tablet Take 1-2 tablets (25-50 mg total) by mouth See admin instructions. Take 25 mg by mouth in the morning and 50 mg at bedtime 270 tablet 4   triamcinolone cream (KENALOG) 0.1 % APPLY 1 APPLICATION TOPICALLY TO AFFECTED AREA TWICE A DAY  AS NEEDED FOR RASH 80 g 10   TRUEplus Lancets 33G MISC TEST BLOOD SUGAR ONCE DAILY (NEED MD APPOINTMENT) 100 each 3   Ubrogepant (UBRELVY) 50 MG TABS Take 1 tab at onset of migraine.  May repeat in 2 hrs, if needed.  Max dose: 2 tabs/day. This is a 30 day prescription. 12 tablet 11   vitamin B-12 (CYANOCOBALAMIN) 1000 MCG tablet Take 1 tablet (1,000 mcg total) by mouth every Monday, Wednesday, and Friday.     THERATEARS PF 0.25 % SOLN Place 2 drops into both eyes daily as needed (dry eyes).     No facility-administered medications prior to visit.     Per HPI unless specifically indicated in ROS section below Review of Systems  Objective:  BP 136/70   Pulse 66   Temp 97.6 F (36.4 C) (Temporal)   Ht 5' 2.25" (1.581 m)   Wt 169 lb 2 oz (76.7 kg)   SpO2 98%   BMI 30.69 kg/m   Wt Readings from Last 3 Encounters:  05/06/22 169 lb 2 oz (76.7 kg)  01/26/22 168 lb 4 oz (76.3 kg)  10/15/21 166 lb (75.3 kg)      Physical Exam Vitals and nursing note reviewed.  Constitutional:      Appearance: Normal appearance. She is not ill-appearing.  HENT:     [name]: Normocephalic and atraumatic.     Right Ear: Tympanic membrane, ear canal and external ear normal. There is no impacted cerumen.     Left Ear: Tympanic membrane, ear canal and external ear normal. There is no impacted cerumen.     Nose: Mucosal edema, congestion and rhinorrhea present.     Right Turbinates: Swollen. Not enlarged or pale.     Left Turbinates: Swollen. Not enlarged or pale.     Right Sinus: No maxillary sinus tenderness or frontal sinus tenderness.     Left Sinus: No maxillary sinus tenderness or frontal sinus tenderness.     Mouth/Throat:     Mouth: Mucous membranes are moist.     Pharynx: Oropharynx is clear. No oropharyngeal exudate or posterior oropharyngeal erythema.  Eyes:     Extraocular Movements: Extraocular movements intact.     Conjunctiva/sclera: Conjunctivae normal.     Pupils: Pupils are equal, round,  and reactive to light.  Cardiovascular:     Rate and Rhythm: Normal rate and regular rhythm.     Pulses: Normal pulses.     Heart sounds: Normal heart sounds. No murmur heard. Pulmonary:     Effort: Pulmonary effort is normal. No respiratory distress.     Breath sounds: Normal breath sounds. No wheezing, rhonchi or rales.  Lymphadenopathy:     [name]:     Right side of [name]: Tonsillar adenopathy present. No submental, submandibular, preauricular or posterior auricular adenopathy.     Left side of [name]: No submental, submandibular, tonsillar, preauricular or posterior auricular adenopathy.  Cervical: Cervical adenopathy (R sided) present.     Right cervical: No superficial cervical adenopathy.    Left cervical: No superficial cervical adenopathy.     Upper Body:     Right upper body: No supraclavicular adenopathy.     Left upper body: No supraclavicular adenopathy.  Skin:    Findings: No rash.  Neurological:     Mental Status: She is alert.  Psychiatric:        Mood and Affect: Mood normal.        Behavior: Behavior normal.        Assessment & Plan:   Problem List Items Addressed This Visit     Acute sinusitis - Primary    Anticipate has developed R frontal sinusitis after initial COVID infection given R frontal pain, worsened nasal congestion, loss of taste. Vitals reassuring today.  Rx amoxicillin 891m BID 10d course.  Further supportive measures as per instructions. Discussed caution with dayquil and tylenol sinus relief as both have similar ingredients.  Update if not improving with treatment.       Relevant Medications   amoxicillin (AMOXIL) 875 MG tablet   COVID    [facility]ks [facility]  Doubt COVID relapse as no fevers/chills, cough is improved.        Meds ordered this encounter  Medications   amoxicillin (AMOXIL) 875 MG tablet    Sig: Take 1 tablet (875 mg total) by mouth 2 (two) times daily for 10 days.     Dispense:  20 tablet    Refill:  0   No orders of the defined types were placed in this encounter.    Patient Instructions  I think you may have a bacterial sinus infection after COVID infection. Take amoxicillin 8763mtwice daily for 7-10 days depending on symptom imrovement.  Push fluids and rest. May continue nyquil, albuterol as needed, tessalon perls.  Let usKoreanow if not improving each day or if you develop fever >101, worsening productive cough or worsening symptoms in general.   Follow up plan: Return if symptoms worsen or fail to improve.  JaRia BushMD

## 2022-05-10 DIAGNOSIS — Z1231 Encounter for screening mammogram for malignant neoplasm of breast: Secondary | ICD-10-CM | POA: Diagnosis not present

## 2022-05-10 LAB — HM MAMMOGRAPHY

## 2022-05-16 ENCOUNTER — Encounter: Payer: Self-pay | Admitting: Family Medicine

## 2022-06-28 ENCOUNTER — Other Ambulatory Visit: Payer: Self-pay | Admitting: Neurology

## 2022-06-28 ENCOUNTER — Other Ambulatory Visit: Payer: Self-pay | Admitting: Family Medicine

## 2022-06-28 DIAGNOSIS — K219 Gastro-esophageal reflux disease without esophagitis: Secondary | ICD-10-CM

## 2022-07-06 ENCOUNTER — Telehealth: Payer: Self-pay | Admitting: Family Medicine

## 2022-07-06 DIAGNOSIS — K219 Gastro-esophageal reflux disease without esophagitis: Secondary | ICD-10-CM

## 2022-07-06 NOTE — Telephone Encounter (Signed)
Pt called wants to know the status of refill for Rx lansoprazole (PREVACID) 30 MG capsule  . Stated she call pharmacy rx was cancel . Please advise [phone]

## 2022-07-06 NOTE — Telephone Encounter (Signed)
Per pt's chart, rx sent on 06/29/22, #90/2 to [facility] mail order.   Spoke with pt relaying the above info. States she will call them to check status of shipment.

## 2022-07-07 ENCOUNTER — Other Ambulatory Visit: Payer: Self-pay

## 2022-07-07 DIAGNOSIS — E118 Type 2 diabetes mellitus with unspecified complications: Secondary | ICD-10-CM

## 2022-07-07 MED ORDER — TRUE METRIX AIR GLUCOSE METER W/DEVICE KIT: PACK | 0 refills | Status: DC

## 2022-07-07 MED ORDER — BUTALBITAL-APAP-CAFFEINE 50-325-40 MG PO TABS: 1.0000 | ORAL_TABLET | ORAL | 3 refills | Status: DC | PRN

## 2022-07-07 MED ORDER — TOPIRAMATE 25 MG PO TABS: 25.0000 mg | ORAL_TABLET | ORAL | 1 refills | Status: DC

## 2022-07-07 MED ORDER — LANSOPRAZOLE 30 MG PO CPDR: DELAYED_RELEASE_CAPSULE | ORAL | 0 refills | Status: DC

## 2022-07-07 NOTE — Telephone Encounter (Addendum)
She said she called and they said they can't get it to her for 3 to 5 days and asked if something can be called in today to [place], [address] [facility] [address]. She said she is out

## 2022-07-07 NOTE — Telephone Encounter (Signed)
E-scribed 30-day supply to [place].

## 2022-07-07 NOTE — Telephone Encounter (Signed)
Received faxed rx request for True Metrix Air Glucose Meter from [facility] order pharmacy.   E-scribed rx.

## 2022-07-07 NOTE — Addendum Note (Signed)
Addended by: Brenton Grills on: [id number] PM   Modules accepted: Orders

## 2022-07-08 NOTE — Telephone Encounter (Signed)
Pt called in stated she still have not receive her RX lansoprazole (PREVACID) 30 MG capsule  refill pharmacy stated they did not get request to refill would like a call back [phone]

## 2022-07-08 NOTE — Telephone Encounter (Signed)
Spoke with [facility] asking if rx was received. Confirms it was received but suggests 10 caps since pt is waiting for shipment from mail order. They will also apply a discount card to help with the out-of-pocket cost. Cost to pt will be $4.  Spoke with pt notifying pt of above. Pt verbalizes understanding and expresses her thanks.

## 2022-07-27 ENCOUNTER — Encounter: Payer: Self-pay | Admitting: Family Medicine

## 2022-07-27 ENCOUNTER — Ambulatory Visit (INDEPENDENT_AMBULATORY_CARE_PROVIDER_SITE_OTHER): Payer: Medicare HMO | Admitting: Family Medicine

## 2022-07-27 VITALS — BP 132/66 | HR 71 | Temp 97.7°F | Ht 62.25 in | Wt 163.4 lb

## 2022-07-27 DIAGNOSIS — M79672 Pain in left foot: Secondary | ICD-10-CM | POA: Diagnosis not present

## 2022-07-27 DIAGNOSIS — I69351 Hemiplegia and hemiparesis following cerebral infarction affecting right dominant side: Secondary | ICD-10-CM

## 2022-07-27 DIAGNOSIS — E1169 Type 2 diabetes mellitus with other specified complication: Secondary | ICD-10-CM | POA: Diagnosis not present

## 2022-07-27 DIAGNOSIS — L304 Erythema intertrigo: Secondary | ICD-10-CM

## 2022-07-27 DIAGNOSIS — R234 Changes in skin texture: Secondary | ICD-10-CM

## 2022-07-27 DIAGNOSIS — E118 Type 2 diabetes mellitus with unspecified complications: Secondary | ICD-10-CM

## 2022-07-27 LAB — POCT GLYCOSYLATED HEMOGLOBIN (HGB A1C): Hemoglobin A1C: 6.4 % — AB (ref 4.0–5.6)

## 2022-07-27 MED ORDER — NYSTATIN 100000 UNIT/GM EX CREA: 1.0000 | TOPICAL_CREAM | Freq: Two times a day (BID) | CUTANEOUS | 1 refills | Status: DC

## 2022-07-27 NOTE — Progress Notes (Unsigned)
Patient ID: Dana [name], female    DOB: 1946-03-28, 77 y.o.   MRN: [id number]  This visit was conducted in person.  BP 132/66   Pulse 71   Temp 97.7 F (36.5 C) (Temporal)   Ht 5' 2.25" (1.581 m)   Wt 163 lb 6 oz (74.1 kg)   SpO2 98%   BMI 29.64 kg/m    CC: 6 mo DM f/u visit  Subjective:   HPI: Dana [name] is a 77 y.o. female presenting on 07/27/2022 for Medical Management of Chronic Issues (Here for 6 mo DM f/u.)   Living in [place]. Drove here with husband today.   Notes intermittent L ankle discomfort as well as to toes and ball of left foot. No paresthesias or numbness or weakness to foot. Has tried CBD cream with benefit.   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 Referred back to PT 01/2022 - saw [facility] PT, last session was late 04/2022.   By the way - rash below breasts not improving despite triamcinolone cream she's been using previously prescribed by dermatology.   DM - does regularly check sugars <110 fasting. Compliant with antihyperglycemic regimen which includes: diet controlled. Denies low sugars or hypoglycemic symptoms. Denies paresthesias, blurry vision. Last diabetic eye exam 04/2020 - DUE sees Dr Rick Duff at [facility]. Glucometer brand: True Metrix. Last foot exam: 03/2021 - DUE. DSME: completed 1 class 07/2017. Lab Results  Component Value Date   HGBA1C 6.4 (A) 07/27/2022   Diabetic Foot Exam - Simple   Simple Foot Form Diabetic Foot exam was performed with the following findings: Yes 07/27/2022  5:12 PM  Visual Inspection No deformities, no ulcerations, no other skin breakdown bilaterally: Yes Sensation Testing Intact to touch and monofilament testing bilaterally: Yes Pulse Check Posterior Tibialis and Dorsalis pulse intact bilaterally: Yes Comments    Lab Results  Component Value Date   MICROALBUR 3.1 (H) 01/21/2022         Relevant past medical, surgical, family and social history reviewed and  updated as indicated. Interim medical history since our last visit reviewed. Allergies and medications reviewed and updated. Outpatient Medications Prior to Visit  Medication Sig Dispense Refill   acetaminophen (TYLENOL) 325 MG tablet Take 1 tablet (325 mg total) by mouth every 6 (six) hours as needed for mild pain.     Alcohol Swabs (DROPSAFE ALCOHOL PREP) 70 % PADS USE TO TEST BLOOD SUGAR ONCE DAILY (NEED MD APPOINTMENT) 100 each 3   atorvastatin (LIPITOR) 40 MG tablet Take 1 tablet (40 mg total) by mouth at bedtime. 90 tablet 3   benzonatate (TESSALON) 200 MG capsule Take 1 capsule (200 mg total) by mouth 3 (three) times daily as needed for cough. 30 capsule 1   Blood Glucose Calibration (TRUE METRIX LEVEL 1) Low SOLN USE TO CHECK METER 1 each 0   Blood Glucose Monitoring Suppl (TRUE METRIX AIR GLUCOSE METER) w/Device KIT Use as instructed to check blood sugar once a day 1 kit 0   butalbital-acetaminophen-caffeine (FIORICET) 50-325-40 MG tablet Take 1 tablet by mouth as needed for headache. TAKE 1 TABLET EVERY 6 HOURS AS NEEDED FOR HEADACHE 10 tablet 3   clopidogrel (PLAVIX) 75 MG tablet TAKE 1 TABLET (75 MG TOTAL) BY MOUTH DAILY. 90 tablet 3   co-enzyme Q-10 30 MG capsule Take 100 mg by mouth daily.     fluticasone (FLONASE) 50 MCG/ACT nasal spray USE 2 SPRAYS IN EACH NOSTRIL EVERY DAY 48 g  3   glucose blood (TRUE METRIX BLOOD GLUCOSE TEST) test strip TEST BLOOD SUGAR ONE TIME DAILY 100 strip 3   lactase (LACTAID) 3000 units tablet Take 2 tablets (6,000 Units total) by mouth 3 (three) times daily with meals. (Patient taking differently: Take 6,000 Units by mouth See admin instructions. Take 6,000 units (2 tablets) by mouth up to three times a day WITH DAIRY PRODUCTS)     lansoprazole (PREVACID) 30 MG capsule TAKE 1 CAPSULE (30 MG TOTAL) BY MOUTH DAILY AT 12 NOON. REPLACE PANTOPRAZOLE 30 capsule 0   methocarbamol (ROBAXIN) 500 MG tablet TAKE 1 TABLET THREE TIMES DAILY AS NEEDED FOR MUSCLE SPASMS  (SEDATION PRECAUTIONS). 60 tablet 1   Multiple Vitamin (MULTIVITAMIN) tablet Take 1 tablet by mouth at bedtime.     ondansetron (ZOFRAN) 4 MG tablet Take 1 tablet (4 mg total) by mouth every 8 (eight) hours as needed for nausea or vomiting. 20 tablet 0   topiramate (TOPAMAX) 25 MG tablet Take 1-2 tablets (25-50 mg total) by mouth See admin instructions. Take 25 mg by mouth in the morning and 50 mg at bedtime 180 tablet 1   triamcinolone cream (KENALOG) 0.1 % APPLY 1 APPLICATION TOPICALLY TO AFFECTED AREA TWICE A DAY AS NEEDED FOR RASH 80 g 10   TRUEplus Lancets 33G MISC TEST BLOOD SUGAR ONCE DAILY (NEED MD APPOINTMENT) 100 each 3   Ubrogepant (UBRELVY) 50 MG TABS Take 1 tab at onset of migraine.  May repeat in 2 hrs, if needed.  Max dose: 2 tabs/day. This is a 30 day prescription. 12 tablet 11   vitamin B-12 (CYANOCOBALAMIN) 1000 MCG tablet Take 1 tablet (1,000 mcg total) by mouth every Monday, Wednesday, and Friday.     No facility-administered medications prior to visit.     Per HPI unless specifically indicated in ROS section below Review of Systems  Objective:  BP 132/66   Pulse 71   Temp 97.7 F (36.5 C) (Temporal)   Ht 5' 2.25" (1.581 m)   Wt 163 lb 6 oz (74.1 kg)   SpO2 98%   BMI 29.64 kg/m   Wt Readings from Last 3 Encounters:  07/27/22 163 lb 6 oz (74.1 kg)  05/06/22 169 lb 2 oz (76.7 kg)  01/26/22 168 lb 4 oz (76.3 kg)      Physical Exam Vitals and nursing note reviewed.  Constitutional:      Appearance: Normal appearance. She is not ill-appearing.  Eyes:     Extraocular Movements: Extraocular movements intact.     Conjunctiva/sclera: Conjunctivae normal.     Pupils: Pupils are equal, round, and reactive to light.  Cardiovascular:     Rate and Rhythm: Normal rate and regular rhythm.     Pulses: Normal pulses.     Heart sounds: Normal heart sounds. No murmur heard. Pulmonary:     Effort: Pulmonary effort is normal. No respiratory distress.     Breath sounds:  Normal breath sounds. No wheezing, rhonchi or rales.  Musculoskeletal:     Right lower leg: No edema.     Left lower leg: No edema.     Comments:  See HPI for diabetic foot exam if done L foot: No pain to palpation of left ankle or foot No ligament laxity No pain with calc squeeze No pain at sole or plantar fascia No pain at base of 5th MT or at navicular head No pain at malleoli  Skin:    General: Skin is warm and dry.  Findings: Lesion and rash present.          Comments:  Angry erythematous patches below bilateral breasts with satellite lesions - no coral red fluorescence under Wood's lamp Tender thickening of skin to sole at base of right 1st MTPJ with callus pad in place  Neurological:     Mental Status: She is alert.  Psychiatric:        Mood and Affect: Mood normal.        Behavior: Behavior normal.       Paring of skin of foot: IC obtained. Area cleaned with alcohol pad. Thicker skin pared down with 15 scalpel, pt tolerated well. No bleeding. Black core to center of lesion suggesting plantar wart.   Results for orders placed or performed in visit on 07/27/22  POCT glycosylated hemoglobin (Hb A1C)  Result Value Ref Range   Hemoglobin A1C 6.4 (A) 4.0 - 5.6 %   HbA1c POC (<> result, manual entry)     HbA1c, POC (prediabetic range)     HbA1c, POC (controlled diabetic range)      Assessment & Plan:   Problem List Items Addressed This Visit     Intertrigo    Anticipate candidal intertrigo. No signs of erythrasma.  Advised stop TCI steroid cream which is likely worsening this. Rx nystatin cream BID x 2+ wks, update if not better with this.       Type 2 diabetes mellitus with other specified complication (HCC) - Primary    Chronic, stable, diet controlled - encouraged she continue following diabetic diet.. Will request latest diabetic eye exam.  Foot exam today.       Hemiparesis affecting right side as late effect of stroke (HCC)   Skin thickening     Anticipate plantar wart, r/o corn overlying bony prominence to R sole.  Area pared down, pt tolerated and felt better afterwards. Advised podiatry f/u if not improved.         Meds ordered this encounter  Medications   nystatin cream (MYCOSTATIN)    Sig: Apply 1 Application topically 2 (two) times daily.    Dispense:  80 g    Refill:  1    Orders Placed This Encounter  Procedures   POCT glycosylated hemoglobin (Hb A1C)    Patient Instructions  We will request latest diabetic eye exam from [facility].  Sugars are doing well today! Continue low sugar low carb diabetic diet Right foot pared down - possible plantar wart vs corn.   Stop triamcinolone cream. Start nystatin cream sent to pharmacy - twice daily under breasts. Update [place] with effect.   Follow up plan: Return in about 6 months (around 01/27/2023), or if symptoms worsen or fail to improve, for annual exam, prior fasting for blood work, medicare wellness visit.  [name], MD

## 2022-07-27 NOTE — Assessment & Plan Note (Signed)
Chronic, stable, diet controlled - encouraged she continue following diabetic diet.. Will request latest diabetic eye exam.  Foot exam today.

## 2022-07-27 NOTE — Patient Instructions (Addendum)
We will request latest diabetic eye exam from [facility].  Sugars are doing well today! Continue low sugar low carb diabetic diet Right foot pared down - possible plantar wart vs corn.   Stop triamcinolone cream. Start nystatin cream sent to pharmacy - twice daily under breasts. Update [place] with effect.

## 2022-07-27 NOTE — Assessment & Plan Note (Addendum)
Anticipate candidal intertrigo. No signs of erythrasma.  Advised stop TCI steroid cream which is likely worsening this. Rx nystatin cream BID x 2+ wks, update if not better with this.

## 2022-07-27 NOTE — Assessment & Plan Note (Signed)
Anticipate plantar wart, r/o corn overlying bony prominence to R sole.  Area pared down, pt tolerated and felt better afterwards. Advised podiatry f/u if not improved.

## 2022-07-28 ENCOUNTER — Encounter: Payer: Self-pay | Admitting: Family Medicine

## 2022-07-28 DIAGNOSIS — M79672 Pain in left foot: Secondary | ICD-10-CM | POA: Insufficient documentation

## 2022-07-28 NOTE — Assessment & Plan Note (Signed)
Completed outpatient [facility] 04/2022 - if further PT desired would want done at [facility]

## 2022-07-28 NOTE — Assessment & Plan Note (Signed)
Overall benign exam today - discussed importance of supportive shoes. Not consistent with plantar fasciitis.

## 2022-08-03 ENCOUNTER — Telehealth: Payer: Self-pay | Admitting: Family Medicine

## 2022-08-03 NOTE — Telephone Encounter (Signed)
Stop nystatin cream, try clotrimazole (lotrimin) over the counter twice daily to skin rash.  Update [place] with effect.

## 2022-08-03 NOTE — Telephone Encounter (Signed)
Patient called in and stated that the nystatin cream (MYCOSTATIN) has made her symptoms worse. She stated that it is more redder and itchier. She is wanting to know if something else could be sent in. Please advise. Thank you!

## 2022-08-03 NOTE — Addendum Note (Signed)
Addended by: [name] on: 08/03/2022 05:00 PM   Modules accepted: Orders

## 2022-08-03 NOTE — Telephone Encounter (Signed)
Patient notified as instructed by telephone and verbalized understanding. 

## 2022-08-09 ENCOUNTER — Telehealth: Payer: Self-pay | Admitting: Family Medicine

## 2022-08-09 NOTE — Telephone Encounter (Signed)
Rec OV for re evaluation. Can she come tomorrow at 1pm?

## 2022-08-09 NOTE — Telephone Encounter (Signed)
Patient notified as instructed by telephone and verbalized understanding. Patient stated that the swelling seems to go down some overnight. Patient scheduled for an appointment tomorrow 08/10/22 at 1:00. Patient was advised to keep her legs elevated when sitting around. Patient was given ER precautions and she verbalized understanding.

## 2022-08-09 NOTE — Telephone Encounter (Signed)
Patient called in and stated that she is still having pain in her left foot. She stated that it is swollen, red, and hurts. She isn't able to walk much on it. She was wanting to know if something could be sent in for the pain or if she needs to come back in the office to be seen. Please advise. Thank you!

## 2022-08-09 NOTE — Telephone Encounter (Signed)
Left message on voicemail for patient to call the office back. Relay Dr. Bosie Clos message.

## 2022-08-10 ENCOUNTER — Encounter: Payer: Self-pay | Admitting: Family Medicine

## 2022-08-10 ENCOUNTER — Ambulatory Visit (INDEPENDENT_AMBULATORY_CARE_PROVIDER_SITE_OTHER): Payer: Medicare HMO | Admitting: Family Medicine

## 2022-08-10 VITALS — BP 136/66 | HR 66 | Temp 97.4°F | Ht 62.25 in | Wt 164.5 lb

## 2022-08-10 DIAGNOSIS — E1169 Type 2 diabetes mellitus with other specified complication: Secondary | ICD-10-CM | POA: Diagnosis not present

## 2022-08-10 DIAGNOSIS — R234 Changes in skin texture: Secondary | ICD-10-CM

## 2022-08-10 DIAGNOSIS — M79672 Pain in left foot: Secondary | ICD-10-CM

## 2022-08-10 LAB — SEDIMENTATION RATE: Sed Rate: 9 mm/hr (ref 0–30)

## 2022-08-10 LAB — CBC WITH DIFFERENTIAL/PLATELET
Basophils Absolute: 0 10*3/uL (ref 0.0–0.1)
Basophils Relative: 0.4 % (ref 0.0–3.0)
Eosinophils Absolute: 0 10*3/uL (ref 0.0–0.7)
Eosinophils Relative: 0.7 % (ref 0.0–5.0)
HCT: 36.3 % (ref 36.0–46.0)
Hemoglobin: 12.1 g/dL (ref 12.0–15.0)
Lymphocytes Relative: 33.7 % (ref 12.0–46.0)
Lymphs Abs: 1.4 10*3/uL (ref 0.7–4.0)
MCHC: 33.2 g/dL (ref 30.0–36.0)
MCV: 91.4 fl (ref 78.0–100.0)
Monocytes Absolute: 0.7 10*3/uL (ref 0.1–1.0)
Monocytes Relative: 16.5 % — ABNORMAL HIGH (ref 3.0–12.0)
Neutro Abs: 2 10*3/uL (ref 1.4–7.7)
Neutrophils Relative %: 48.7 % (ref 43.0–77.0)
Platelets: 217 10*3/uL (ref 150.0–400.0)
RBC: 3.97 Mil/uL (ref 3.87–5.11)
RDW: 14.7 % (ref 11.5–15.5)
WBC: 4.1 10*3/uL (ref 4.0–10.5)

## 2022-08-10 LAB — URIC ACID: Uric Acid, Serum: 4.3 mg/dL (ref 2.4–7.0)

## 2022-08-10 MED ORDER — PREDNISONE 20 MG PO TABS: ORAL_TABLET | ORAL | 0 refills | Status: DC

## 2022-08-10 NOTE — Progress Notes (Signed)
Patient ID: Dana [name], female    DOB: 18-Mar-1946, 77 y.o.   MRN: [id number]  This visit was conducted in person.  BP 136/66   Pulse 66   Temp (!) 97.4 F (36.3 C) (Temporal)   Ht 5' 2.25" (1.581 m)   Wt 164 lb 8 oz (74.6 kg)   SpO2 97%   BMI 29.85 kg/m    CC: L foot pain redness and swelling  Subjective:   HPI: Dana [name] is a 77 y.o. female presenting on 08/10/2022 for Foot Pain (C/o L foot pain, redness and swelling. Pt accompanied by husband, Dana [name]. )   Ongoing L foot pain present for about 2 weeks, acutely worse over the weekend. Notes burning discomfort, itching and toe swelling, pain starts at toes and can travel up leg. Trouble bending ankle foot and toes due to pain. Swelling to dorsal foot into lower leg. Swelling improves with leg elevation and overnight. Denies inciting trauma/injury or fall.   No fevers/chills No recent bug bites. No recent change in walking or exercise routine.  No h/o gout. No recent change in diet - no shrimp, beer, organ or red meats.   Known diabetic  Lab Results  Component Value Date   HGBA1C 6.4 (A) 07/27/2022        Relevant past medical, surgical, family and social history reviewed and updated as indicated. Interim medical history since our last visit reviewed. Allergies and medications reviewed and updated. Outpatient Medications Prior to Visit  Medication Sig Dispense Refill   acetaminophen (TYLENOL) 325 MG tablet Take 1 tablet (325 mg total) by mouth every 6 (six) hours as needed for mild pain.     Alcohol Swabs (DROPSAFE ALCOHOL PREP) 70 % PADS USE TO TEST BLOOD SUGAR ONCE DAILY (NEED MD APPOINTMENT) 100 each 3   atorvastatin (LIPITOR) 40 MG tablet Take 1 tablet (40 mg total) by mouth at bedtime. 90 tablet 3   benzonatate (TESSALON) 200 MG capsule Take 1 capsule (200 mg total) by mouth 3 (three) times daily as needed for cough. 30 capsule 1   Blood Glucose Calibration (TRUE METRIX LEVEL 1) Low SOLN USE TO CHECK METER 1  each 0   Blood Glucose Monitoring Suppl (TRUE METRIX AIR GLUCOSE METER) w/Device KIT Use as instructed to check blood sugar once a day 1 kit 0   butalbital-acetaminophen-caffeine (FIORICET) 50-325-40 MG tablet Take 1 tablet by mouth as needed for headache. TAKE 1 TABLET EVERY 6 HOURS AS NEEDED FOR HEADACHE 10 tablet 3   clopidogrel (PLAVIX) 75 MG tablet TAKE 1 TABLET (75 MG TOTAL) BY MOUTH DAILY. 90 tablet 3   co-enzyme Q-10 30 MG capsule Take 100 mg by mouth daily.     fluticasone (FLONASE) 50 MCG/ACT nasal spray USE 2 SPRAYS IN EACH NOSTRIL EVERY DAY 48 g 3   glucose blood (TRUE METRIX BLOOD GLUCOSE TEST) test strip TEST BLOOD SUGAR ONE TIME DAILY 100 strip 3   lactase (LACTAID) 3000 units tablet Take 2 tablets (6,000 Units total) by mouth 3 (three) times daily with meals. (Patient taking differently: Take 6,000 Units by mouth See admin instructions. Take 6,000 units (2 tablets) by mouth up to three times a day WITH DAIRY PRODUCTS)     lansoprazole (PREVACID) 30 MG capsule TAKE 1 CAPSULE (30 MG TOTAL) BY MOUTH DAILY AT 12 NOON. REPLACE PANTOPRAZOLE 30 capsule 0   methocarbamol (ROBAXIN) 500 MG tablet TAKE 1 TABLET THREE TIMES DAILY AS NEEDED FOR MUSCLE SPASMS (SEDATION PRECAUTIONS). [place]  tablet 1   Multiple Vitamin (MULTIVITAMIN) tablet Take 1 tablet by mouth at bedtime.     ondansetron (ZOFRAN) 4 MG tablet Take 1 tablet (4 mg total) by mouth every 8 (eight) hours as needed for nausea or vomiting. 20 tablet 0   topiramate (TOPAMAX) 25 MG tablet Take 1-2 tablets (25-50 mg total) by mouth See admin instructions. Take 25 mg by mouth in the morning and 50 mg at bedtime 180 tablet 1   triamcinolone cream (KENALOG) 0.1 % APPLY 1 APPLICATION TOPICALLY TO AFFECTED AREA TWICE A DAY AS NEEDED FOR RASH 80 g 10   TRUEplus Lancets 33G MISC TEST BLOOD SUGAR ONCE DAILY (NEED MD APPOINTMENT) 100 each 3   Ubrogepant (UBRELVY) 50 MG TABS Take 1 tab at onset of migraine.  May repeat in 2 hrs, if needed.  Max dose: 2  tabs/day. This is a 30 day prescription. 12 tablet 11   vitamin B-12 (CYANOCOBALAMIN) 1000 MCG tablet Take 1 tablet (1,000 mcg total) by mouth every Monday, Wednesday, and Friday.     No facility-administered medications prior to visit.     Per HPI unless specifically indicated in ROS section below Review of Systems  Objective:  BP 136/66   Pulse 66   Temp (!) 97.4 F (36.3 C) (Temporal)   Ht 5' 2.25" (1.581 m)   Wt 164 lb 8 oz (74.6 kg)   SpO2 97%   BMI 29.85 kg/m   Wt Readings from Last 3 Encounters:  08/10/22 164 lb 8 oz (74.6 kg)  07/27/22 163 lb 6 oz (74.1 kg)  05/06/22 169 lb 2 oz (76.7 kg)      Physical Exam Vitals and nursing note reviewed.  Constitutional:      Appearance: Normal appearance. She is not ill-appearing.  Musculoskeletal:        General: Swelling and tenderness present.     Right lower leg: Edema (tr) present.     Left lower leg: Edema (tr) present.     Comments:  L foot:  No ligament laxity, no pain with palpation of ankle or achilles tendon or at base of 5th MT or at navicular.  Tender swelling with mild erythema and warmth to anterior dorsal midfoot  Point tenderness to 2nd MTPJ No significant pain to palpation of base of foot No pain with axial loading No pain at 1st MTPJ 1+ DP bilaterally  Skin:    General: Skin is warm and dry.     Findings: Erythema (mild) present. No rash.     Comments: Ongoing painful callus to R medial sole despite callus pad use   Neurological:     Mental Status: She is alert.  Psychiatric:        Mood and Affect: Mood normal.        Behavior: Behavior normal.       Results for orders placed or performed in visit on 07/27/22  POCT glycosylated hemoglobin (Hb A1C)  Result Value Ref Range   Hemoglobin A1C 6.4 (A) 4.0 - 5.6 %   HbA1c POC (<> result, manual entry)     HbA1c, POC (prediabetic range)     HbA1c, POC (controlled diabetic range)      Assessment & Plan:   Problem List Items Addressed This Visit      Type 2 diabetes mellitus with other specified complication   Skin thickening    Present to right sole suspect plantar wart. Some improvement after paring skin last week however with persistent tenderness - will  refer to podiatry      Relevant Orders   Ambulatory referral to Podiatry   Left foot pain - Primary    Worsening pain  ?acute gout attack vs capsulitis vs other - check urate as well as ESR, CBC today. Not consistent with cellulitis.  Rx prednisone course. Discussed steroid precautions in diabetic.  Avoid NSAIDs in stroke history and plavix use. Update if not improving with treatment - may f/u with podiatry if not better.       Relevant Orders   CBC with Differential/Platelet   Uric acid   Sedimentation rate   Ambulatory referral to Podiatry     Meds ordered this encounter  Medications   predniSONE (DELTASONE) 20 MG tablet    Sig: Take two tablets daily for 4 days followed by one tablet daily for 4 days    Dispense:  12 tablet    Refill:  0    Orders Placed This Encounter  Procedures   CBC with Differential/Platelet   Uric acid   Sedimentation rate   Ambulatory referral to Podiatry    Referral Priority:   Routine    Referral Type:   Consultation    Referral Reason:   Specialty Services Required    Requested Specialty:   Podiatry    Number of Visits Requested:   1    Patient Instructions  Labs today to check for gout/infection.  Suspicious for gout attack of left foot vs capsulitis of toe joint  Treat with prednisone 20mg  2 tablets daily for 4 days follow by 1 tablet daily for 4 days. Caution it can cause spike in sugar levels - limit sweets, sugar, carbs while on prednisone.  Let us know if not better with this.  Ok to use tylenol as needed as well.  We will refer you to podiatrist for corn/callus/possible plantar wart on right sole  Follow up plan: Return if symptoms worsen or fail to improve.  [name], MD

## 2022-08-10 NOTE — Assessment & Plan Note (Addendum)
Worsening pain  ?acute gout attack vs capsulitis vs other - check urate as well as ESR, CBC today. Not consistent with cellulitis.  Rx prednisone course. Discussed steroid precautions in diabetic.  Avoid NSAIDs in stroke history and plavix use. Update if not improving with treatment - may f/u with podiatry if not better.

## 2022-08-10 NOTE — Patient Instructions (Addendum)
Labs today to check for gout/infection.  Suspicious for gout attack of left foot vs capsulitis of toe joint  Treat with prednisone 20mg  2 tablets daily for 4 days follow by 1 tablet daily for 4 days. Caution it can cause spike in sugar levels - limit sweets, sugar, carbs while on prednisone.  Let us know if not better with this.  Ok to use tylenol as needed as well.  We will refer you to podiatrist for corn/callus/possible plantar wart on right sole

## 2022-08-10 NOTE — Assessment & Plan Note (Addendum)
Present to right sole suspect plantar wart. Some improvement after paring skin last week however with persistent tenderness - will refer to podiatry

## 2022-08-15 DIAGNOSIS — M216X2 Other acquired deformities of left foot: Secondary | ICD-10-CM | POA: Diagnosis not present

## 2022-08-15 DIAGNOSIS — M216X1 Other acquired deformities of right foot: Secondary | ICD-10-CM | POA: Diagnosis not present

## 2022-08-15 DIAGNOSIS — M79671 Pain in right foot: Secondary | ICD-10-CM | POA: Diagnosis not present

## 2022-08-15 DIAGNOSIS — M79622 Pain in left upper arm: Secondary | ICD-10-CM | POA: Diagnosis not present

## 2022-08-15 DIAGNOSIS — L84 Corns and callosities: Secondary | ICD-10-CM | POA: Diagnosis not present

## 2022-08-15 DIAGNOSIS — M84375A Stress fracture, left foot, initial encounter for fracture: Secondary | ICD-10-CM | POA: Diagnosis not present

## 2022-09-05 DIAGNOSIS — L84 Corns and callosities: Secondary | ICD-10-CM | POA: Diagnosis not present

## 2022-09-05 DIAGNOSIS — M216X1 Other acquired deformities of right foot: Secondary | ICD-10-CM | POA: Diagnosis not present

## 2022-09-05 DIAGNOSIS — M84375A Stress fracture, left foot, initial encounter for fracture: Secondary | ICD-10-CM | POA: Diagnosis not present

## 2022-09-05 DIAGNOSIS — M79672 Pain in left foot: Secondary | ICD-10-CM | POA: Diagnosis not present

## 2022-09-05 DIAGNOSIS — M79671 Pain in right foot: Secondary | ICD-10-CM | POA: Diagnosis not present

## 2022-09-05 DIAGNOSIS — M216X2 Other acquired deformities of left foot: Secondary | ICD-10-CM | POA: Diagnosis not present

## 2022-09-09 DIAGNOSIS — M79672 Pain in left foot: Secondary | ICD-10-CM | POA: Diagnosis not present

## 2022-09-09 DIAGNOSIS — L539 Erythematous condition, unspecified: Secondary | ICD-10-CM | POA: Diagnosis not present

## 2022-09-09 DIAGNOSIS — R609 Edema, unspecified: Secondary | ICD-10-CM | POA: Diagnosis not present

## 2022-09-09 DIAGNOSIS — R600 Localized edema: Secondary | ICD-10-CM | POA: Diagnosis not present

## 2022-09-26 DIAGNOSIS — M216X1 Other acquired deformities of right foot: Secondary | ICD-10-CM | POA: Diagnosis not present

## 2022-09-26 DIAGNOSIS — M216X2 Other acquired deformities of left foot: Secondary | ICD-10-CM | POA: Diagnosis not present

## 2022-09-26 DIAGNOSIS — L84 Corns and callosities: Secondary | ICD-10-CM | POA: Diagnosis not present

## 2022-09-26 DIAGNOSIS — M79671 Pain in right foot: Secondary | ICD-10-CM | POA: Diagnosis not present

## 2022-09-26 DIAGNOSIS — M84375A Stress fracture, left foot, initial encounter for fracture: Secondary | ICD-10-CM | POA: Diagnosis not present

## 2022-09-26 DIAGNOSIS — M79672 Pain in left foot: Secondary | ICD-10-CM | POA: Diagnosis not present

## 2022-09-28 ENCOUNTER — Other Ambulatory Visit: Payer: Self-pay | Admitting: Family Medicine

## 2022-09-28 DIAGNOSIS — E118 Type 2 diabetes mellitus with unspecified complications: Secondary | ICD-10-CM

## 2022-09-28 DIAGNOSIS — K219 Gastro-esophageal reflux disease without esophagitis: Secondary | ICD-10-CM

## 2022-09-28 NOTE — Telephone Encounter (Signed)
Spoke to patient by telephone and was advised that she only has 5 pills left. Patient stated that she called her mail order pharmacy because she thought the medication was on auto refill. Patient stated that her mail order pharmacy told her that it is not due for refill until 10/12/22. Patient stated that she needs about 14 days of the medication sent to [facility] to last her until she can get the medication from her mail order pharmacy. Patient was advised that she may have to pay for the medication out of pocket since it is not due for refill right now and she verbalized understanding.  Patient stated that she does not know how this happened because she has only been taking one a day. Patient stated that her mail order pharmacy will mail her medication out when it is due for refill.

## 2022-09-28 NOTE — Telephone Encounter (Signed)
Prescription Request  09/28/2022  LOV: 08/10/2022  What is the name of the medication or equipment? lansoprazole (PREVACID) 30 MG capsule   Have you contacted your pharmacy to request a refill? No   Which pharmacy would you like this sent to?   [id number] - [place], [place] - [address] AT [facility] [address] [place] [address] Phone: [phone] Fax: [phone]    Patient notified that their request is being sent to the clinical staff for review and that they should receive a response within 2 business days.   Please advise at Mobile [phone] (mobile)

## 2022-09-29 MED ORDER — LANSOPRAZOLE 30 MG PO CPDR: DELAYED_RELEASE_CAPSULE | ORAL | 0 refills | Status: DC

## 2022-09-29 NOTE — Telephone Encounter (Signed)
Refilled 2 wk supply as per below.

## 2022-10-04 ENCOUNTER — Other Ambulatory Visit: Payer: Self-pay | Admitting: Neurology

## 2022-10-05 ENCOUNTER — Ambulatory Visit (INDEPENDENT_AMBULATORY_CARE_PROVIDER_SITE_OTHER): Payer: Medicare HMO | Admitting: Family Medicine

## 2022-10-05 ENCOUNTER — Encounter: Payer: Self-pay | Admitting: Family Medicine

## 2022-10-05 VITALS — BP 120/70 | HR 66 | Temp 97.4°F | Ht 62.25 in | Wt 164.2 lb

## 2022-10-05 DIAGNOSIS — M79672 Pain in left foot: Secondary | ICD-10-CM

## 2022-10-05 DIAGNOSIS — M79671 Pain in right foot: Secondary | ICD-10-CM

## 2022-10-05 DIAGNOSIS — R234 Changes in skin texture: Secondary | ICD-10-CM | POA: Diagnosis not present

## 2022-10-05 DIAGNOSIS — G609 Hereditary and idiopathic neuropathy, unspecified: Secondary | ICD-10-CM

## 2022-10-05 DIAGNOSIS — M79605 Pain in left leg: Secondary | ICD-10-CM | POA: Diagnosis not present

## 2022-10-05 DIAGNOSIS — M79604 Pain in right leg: Secondary | ICD-10-CM | POA: Diagnosis not present

## 2022-10-05 DIAGNOSIS — R0989 Other specified symptoms and signs involving the circulatory and respiratory systems: Secondary | ICD-10-CM | POA: Diagnosis not present

## 2022-10-05 NOTE — Patient Instructions (Addendum)
We will refer you for arterial circulation. If arteries are healthy, we will start compression stockings and may refer you to vascular doctor.  Keep legs elevated in the meantime.  Let us know if they don't contact you in the next week to schedule

## 2022-10-05 NOTE — Progress Notes (Signed)
Ph: [phone] Fax: [phone]   Patient ID: Dana [name], female    DOB: 11/09/1945, 77 y.o.   MRN: [id number]  This visit was conducted in person.  BP 120/70   Pulse 66   Temp (!) 97.4 F (36.3 C) (Temporal)   Ht 5' 2.25" (1.581 m)   Wt 164 lb 4 oz (74.5 kg)   SpO2 98%   BMI 29.80 kg/m    CC: ongoing foot pain/swelling Subjective:   HPI: Dana [name] is a 77 y.o. female presenting on 10/05/2022 for Leg Swelling (C/o B leg/ankle swelling. Started several wks ago. Seen by podiatry, given prednisone- helpful. )   Lives in [place].   See prior note for details.  L foot pain and bilateral ankle swelling present for the past 2+ months. Saw Dr Hinda Glatter podiatry in [facility] [address] (note requested) who treated with 12d steroid taper + tylenol with benefit. She finished prednisone 2 wks ago. Initially thought possible stress fracture so placed in postop shoe x3 wks without significant benefit - no definitive stress fracture found. No clear diagnosis for symptoms. She had negative LLE venous US r/o DVT, also no DVT in proximal R leg. He advised she return to PCP for re evaluation.   Now R ankle has developed swelling tender over the past 2 weeks. Denies falls or injury. Ongoing left leg and foot pain/swelling.  No fevers/chills, dyspnea, chest pain, abd pain.  No swelling to hands  No h/o gout. Uric acid, CBC, ESR normal 08/2022 pointing against gout or infectious cause.   She completed PT course [facility]).      Relevant past medical, surgical, family and social history reviewed and updated as indicated. Interim medical history since our last visit reviewed. Allergies and medications reviewed and updated. Outpatient Medications Prior to Visit  Medication Sig Dispense Refill   acetaminophen (TYLENOL) 325 MG tablet Take 1 tablet (325 mg total) by mouth every 6 (six) hours as needed for mild pain.     Alcohol Swabs (DROPSAFE ALCOHOL PREP) 70 % PADS USE  TO TEST BLOOD SUGAR ONCE DAILY (NEED MD APPOINTMENT) 100 each 3   atorvastatin (LIPITOR) 40 MG tablet Take 1 tablet (40 mg total) by mouth at bedtime. 90 tablet 3   benzonatate (TESSALON) 200 MG capsule Take 1 capsule (200 mg total) by mouth 3 (three) times daily as needed for cough. 30 capsule 1   Blood Glucose Calibration (TRUE METRIX LEVEL 1) Low SOLN USE TO CHECK METER 1 each 0   Blood Glucose Monitoring Suppl (TRUE METRIX AIR GLUCOSE METER) w/Device KIT USE AS INSTRUCTED TO CHECK BLOOD SUGAR ONCE A DAY 1 kit 0   butalbital-acetaminophen-caffeine (FIORICET) 50-325-40 MG tablet Take 1 tablet by mouth as needed for headache. TAKE 1 TABLET EVERY 6 HOURS AS NEEDED FOR HEADACHE 10 tablet 3   clopidogrel (PLAVIX) 75 MG tablet TAKE 1 TABLET (75 MG TOTAL) BY MOUTH DAILY. 90 tablet 3   co-enzyme Q-10 30 MG capsule Take 100 mg by mouth daily.     fluticasone (FLONASE) 50 MCG/ACT nasal spray USE 2 SPRAYS IN EACH NOSTRIL EVERY DAY 48 g 3   glucose blood (TRUE METRIX BLOOD GLUCOSE TEST) test strip TEST BLOOD SUGAR ONE TIME DAILY 100 strip 3   lactase (LACTAID) 3000 units tablet Take 2 tablets (6,000 Units total) by mouth 3 (three) times daily with meals. (Patient taking differently: Take 6,000 Units by mouth See admin instructions. Take 6,000 units (2 tablets) by mouth  up to three times a day WITH DAIRY PRODUCTS)     lansoprazole (PREVACID) 30 MG capsule TAKE 1 CAPSULE (30 MG TOTAL) BY MOUTH DAILY AT 12 NOON. REPLACE PANTOPRAZOLE 14 capsule 0   Multiple Vitamin (MULTIVITAMIN) tablet Take 1 tablet by mouth at bedtime.     ondansetron (ZOFRAN) 4 MG tablet Take 1 tablet (4 mg total) by mouth every 8 (eight) hours as needed for nausea or vomiting. 20 tablet 0   topiramate (TOPAMAX) 25 MG tablet Take 1-2 tablets (25-50 mg total) by mouth See admin instructions. Take 25 mg by mouth in the morning and 50 mg at bedtime 180 tablet 1   TRUEplus Lancets 33G MISC TEST BLOOD SUGAR ONCE DAILY (NEED MD APPOINTMENT) 100 each  3   Ubrogepant (UBRELVY) 50 MG TABS Take 1 tab at onset of migraine.  May repeat in 2 hrs, if needed.  Max dose: 2 tabs/day. This is a 30 day prescription. 12 tablet 11   vitamin B-12 (CYANOCOBALAMIN) 1000 MCG tablet Take 1 tablet (1,000 mcg total) by mouth every Monday, Wednesday, and Friday.     predniSONE (DELTASONE) 20 MG tablet Take two tablets daily for 4 days followed by one tablet daily for 4 days 12 tablet 0   methocarbamol (ROBAXIN) 500 MG tablet TAKE 1 TABLET THREE TIMES DAILY AS NEEDED FOR MUSCLE SPASMS (SEDATION PRECAUTIONS). 60 tablet 1   triamcinolone cream (KENALOG) 0.1 % APPLY 1 APPLICATION TOPICALLY TO AFFECTED AREA TWICE A DAY AS NEEDED FOR RASH 80 g 10   No facility-administered medications prior to visit.     Per HPI unless specifically indicated in ROS section below Review of Systems  Objective:  BP 120/70   Pulse 66   Temp (!) 97.4 F (36.3 C) (Temporal)   Ht 5' 2.25" (1.581 m)   Wt 164 lb 4 oz (74.5 kg)   SpO2 98%   BMI 29.80 kg/m   Wt Readings from Last 3 Encounters:  10/05/22 164 lb 4 oz (74.5 kg)  08/10/22 164 lb 8 oz (74.6 kg)  07/27/22 163 lb 6 oz (74.1 kg)      Physical Exam Vitals and nursing note reviewed.  Constitutional:      Appearance: Normal appearance. She is not ill-appearing.  HENT:     Mouth/Throat:     Mouth: Mucous membranes are moist.     Pharynx: Oropharynx is clear. No oropharyngeal exudate or posterior oropharyngeal erythema.  Cardiovascular:     Rate and Rhythm: Normal rate and regular rhythm.     Pulses: Normal pulses.     Heart sounds: Normal heart sounds. No murmur heard. Pulmonary:     Effort: Pulmonary effort is normal. No respiratory distress.     Breath sounds: Normal breath sounds. No wheezing, rhonchi or rales.  Musculoskeletal:        General: Tenderness present.     Right lower leg: Edema (tr-1+) present.     Left lower leg: Edema (tr-1+) present.     Comments:  2+ DP on left, diminished on right General  tenderness to palpation bilateral lower extremities including feet without focal point of tenderness  No erythema, warmth, rash.   Skin:    General: Skin is warm and dry.     Findings: No erythema or rash.     Comments: There are reticular and spider veins to BLE with venous node to LE  Neurological:     Mental Status: She is alert.  Psychiatric:  Mood and Affect: Mood normal.        Behavior: Behavior normal.       Results for orders placed or performed in visit on 08/10/22  CBC with Differential/Platelet  Result Value Ref Range   WBC 4.1 4.0 - 10.5 K/uL   RBC 3.97 3.87 - 5.11 Mil/uL   Hemoglobin 12.1 12.0 - 15.0 g/dL   HCT 40.9 81.1 - 91.4 %   MCV 91.4 78.0 - 100.0 fl   MCHC 33.2 30.0 - 36.0 g/dL   RDW 78.2 95.6 - 21.3 %   Platelets 217.0 150.0 - 400.0 K/uL   Neutrophils Relative % 48.7 43.0 - 77.0 %   Lymphocytes Relative 33.7 12.0 - 46.0 %   Monocytes Relative 16.5 (H) 3.0 - 12.0 %   Eosinophils Relative 0.7 0.0 - 5.0 %   Basophils Relative 0.4 0.0 - 3.0 %   Neutro Abs 2.0 1.4 - 7.7 K/uL   Lymphs Abs 1.4 0.7 - 4.0 K/uL   Monocytes Absolute 0.7 0.1 - 1.0 K/uL   Eosinophils Absolute 0.0 0.0 - 0.7 K/uL   Basophils Absolute 0.0 0.0 - 0.1 K/uL  Uric acid  Result Value Ref Range   Uric Acid, Serum 4.3 2.4 - 7.0 mg/dL  Sedimentation rate  Result Value Ref Range   Sed Rate 9 0 - 30 mm/hr   Lab Results  Component Value Date   HGBA1C 6.4 (A) 07/27/2022    Lab Results  Component Value Date   CREATININE 0.77 01/21/2022   BUN 16 01/21/2022   NA 144 01/21/2022   K 3.7 01/21/2022   CL 110 01/21/2022   CO2 27 01/21/2022    LEFT LE Venous US 09/09/2022: Interpretation Summary There is no evidence of DVT or superficial vein thrombophlebitis in the left lower extremity. Note While no calf DVT was detected, a single Doppler study cannot exclude calf DVT completely. If there is high clinical suspicion, consider a repeat study in 5-7 days to detect proximal propagation  of calf DVT. There is no evidence of deep vein thrombosis in the right common femoral vein. Procedure A bilateral lower extremity venous duplex exam was performed. The study was technically adequate with some images being suboptimal in quality. Right Proximal The right common femoral vein demonstrates complete coaptation of the vein walls with compression. Doppler flow analysis is spontaneous, phasic, and augments with distal compression. Left Proximal The left common femoral vein demonstrates complete coaptation of the vein walls with compression. Doppler flow analysis is spontaneous, phasic, and augments with distal compression. The left femoral vein demonstrates complete coaptation of the vein walls with compression. Doppler flow analysis is spontaneous, phasic, and augments with distal compression. Compression of the profunda femoral vein is complete. Spontaneous venous flow in the profunda femoral vein is present. Distal augmentation of flow in the profunda femoral vein is present. The left popliteal vein demonstrates complete coaptation of the vein walls with compression. Doppler flow analysis is spontaneous, phasic, and augments with distal compression. Left Distal Compression of the posterior tibial vein is complete. Distal augmentation of flow in the posterior tibial vein is present. Compression of the left peroneal vein is complete. Distal augmentation of flow in the left peroneal vein is present. Left Saphenous Compression of the great saphenous vein is complete.   Assessment & Plan:   Problem List Items Addressed This Visit     Hereditary and idiopathic peripheral neuropathy    H/o this, current symptoms are not consistent with neuropathy  Skin thickening    Continues using callus pad to right medial sole at 1st MTPJ      Left foot pain    Unclear cause of recent pain - there is improvement but ongoing discomfort. Will request podiatry records to review.        Bilateral pain of leg and foot - Primary    Initial left leg and foot pain/swelling, with venous US negative for DVT. Treated for presumed stress fracture by podiatry with post op shoe for 3 wks without significant benefit. Prednisone taper did seem to help.  Now has developed right sided lower extremity pain and swelling - and noted diminished pulses on right. Will need to r/o arterial insufficiency as cause so I have ordered bilateral ABIs to [facility] which is convenient location for patient.  ?venous insufficiency. If normal ABIs, will recommend compression stockings and VVS eval.       Relevant Orders   VAS [place] ABI WITH/WO TBI   Other Visit Diagnoses     Diminished pulses in lower extremity       Relevant Orders   VAS [place] ABI WITH/WO TBI        No orders of the defined types were placed in this encounter.   No orders of the defined types were placed in this encounter.   Patient Instructions  We will refer you for arterial circulation. If arteries are healthy, we will start compression stockings and may refer you to vascular doctor.  Keep legs elevated in the meantime.  Let us know if they don't contact you in the next week to schedule   Follow up plan: Return if symptoms worsen or fail to improve.  Eustaquio Boyden, MD

## 2022-10-07 ENCOUNTER — Encounter: Payer: Self-pay | Admitting: Family Medicine

## 2022-10-07 DIAGNOSIS — M79605 Pain in left leg: Secondary | ICD-10-CM | POA: Insufficient documentation

## 2022-10-07 NOTE — Assessment & Plan Note (Signed)
Continues using callus pad to right medial sole at 1st MTPJ

## 2022-10-07 NOTE — Assessment & Plan Note (Signed)
Unclear cause of recent pain - there is improvement but ongoing discomfort. Will request podiatry records to review.

## 2022-10-07 NOTE — Assessment & Plan Note (Signed)
H/o this, current symptoms are not consistent with neuropathy

## 2022-10-07 NOTE — Assessment & Plan Note (Addendum)
Initial left leg and foot pain/swelling, with venous US negative for DVT. Treated for presumed stress fracture by podiatry with post op shoe for 3 wks without significant benefit. Prednisone taper did seem to help.  Now has developed right sided lower extremity pain and swelling - and noted diminished pulses on right. Will need to r/o arterial insufficiency as cause so I have ordered bilateral ABIs to [facility] which is convenient location for patient.  ?venous insufficiency. If normal ABIs, will recommend compression stockings and VVS eval.

## 2022-10-10 ENCOUNTER — Telehealth: Payer: Self-pay | Admitting: Family Medicine

## 2022-10-10 NOTE — Telephone Encounter (Signed)
[facility] called needing more info regarding a referral they received on 5/29 from Dr. Reece Agar for the pt. [facility] states they may need a PA for the referral. [facility] states their office has been strict lately about receiving all detailed info regarding the pt & the referral before they're able to schedule the appointment for the pt. Call back # [phone]

## 2022-10-17 ENCOUNTER — Ambulatory Visit (INDEPENDENT_AMBULATORY_CARE_PROVIDER_SITE_OTHER): Payer: Medicare HMO

## 2022-10-17 VITALS — Ht 62.0 in | Wt 164.0 lb

## 2022-10-17 DIAGNOSIS — Z780 Asymptomatic menopausal state: Secondary | ICD-10-CM | POA: Diagnosis not present

## 2022-10-17 DIAGNOSIS — Z0000 Encounter for general adult medical examination without abnormal findings: Secondary | ICD-10-CM

## 2022-10-17 NOTE — Progress Notes (Signed)
I connected with  Dana [name] on 10/17/22 by a audio enabled telemedicine application and verified that I am speaking with the correct person using two identifiers.  Patient Location: Home  Provider Location: Home Office  I discussed the limitations of evaluation and management by telemedicine. The patient expressed understanding and agreed to proceed.  Subjective:   Dana [name] is a 77 y.o. female who presents for Medicare Annual (Subsequent) preventive examination.  Review of Systems      Cardiac Risk Factors include: advanced age (>16men, >79 women);sedentary lifestyle;diabetes mellitus     Objective:    Today's Vitals   10/17/22 0832  Weight: 164 lb (74.4 kg)  Height: 5\' 2"  (1.575 m)  PainSc: 0-No pain   Body mass index is 30 kg/m.     10/17/2022    8:51 AM 10/13/2021    9:26 AM 11/26/2020    3:37 PM 10/06/2020    4:18 PM 09/16/2020    1:22 PM 08/29/2020   10:19 AM 08/29/2020    5:46 AM  Advanced Directives  Does Patient Have a Medical Advance Directive? Yes Yes Yes Yes Yes Yes No;Yes  Type of Estate agent of [place];Living will Healthcare Power of [place];Living will Healthcare Power of [place];Living will Living will;Healthcare Power of Attorney Living will;Healthcare Power of Attorney Living will;Healthcare Power of Attorney Living will;Healthcare Power of Attorney  Does patient want to make changes to medical advance directive? No - Patient declined No - Patient declined No - Patient declined No - Patient declined No - Patient declined No - Patient declined No - Patient declined  Copy of Healthcare Power of Attorney in Chart? Yes - validated most recent copy scanned in chart (See row information) Yes - validated most recent copy scanned in chart (See row information)  No - copy requested No - copy requested No - copy requested No - copy requested    Current Medications (verified) Outpatient Encounter Medications as of 10/17/2022  Medication  Sig   acetaminophen (TYLENOL) 325 MG tablet Take 1 tablet (325 mg total) by mouth every 6 (six) hours as needed for mild pain.   Alcohol Swabs (DROPSAFE ALCOHOL PREP) 70 % PADS USE TO TEST BLOOD SUGAR ONCE DAILY (NEED MD APPOINTMENT)   atorvastatin (LIPITOR) 40 MG tablet Take 1 tablet (40 mg total) by mouth at bedtime.   benzonatate (TESSALON) 200 MG capsule Take 1 capsule (200 mg total) by mouth 3 (three) times daily as needed for cough.   Blood Glucose Calibration (TRUE METRIX LEVEL 1) Low SOLN USE TO CHECK METER   Blood Glucose Monitoring Suppl (TRUE METRIX AIR GLUCOSE METER) w/Device KIT USE AS INSTRUCTED TO CHECK BLOOD SUGAR ONCE A DAY   butalbital-acetaminophen-caffeine (FIORICET) 50-325-40 MG tablet Take 1 tablet by mouth as needed for headache. TAKE 1 TABLET EVERY 6 HOURS AS NEEDED FOR HEADACHE   clopidogrel (PLAVIX) 75 MG tablet TAKE 1 TABLET (75 MG TOTAL) BY MOUTH DAILY.   co-enzyme Q-10 30 MG capsule Take 100 mg by mouth daily.   fluticasone (FLONASE) 50 MCG/ACT nasal spray USE 2 SPRAYS IN EACH NOSTRIL EVERY DAY   glucose blood (TRUE METRIX BLOOD GLUCOSE TEST) test strip TEST BLOOD SUGAR ONE TIME DAILY   lactase (LACTAID) 3000 units tablet Take 2 tablets (6,000 Units total) by mouth 3 (three) times daily with meals. (Patient taking differently: Take 6,000 Units by mouth See admin instructions. Take 6,000 units (2 tablets) by mouth up to three times a day WITH DAIRY PRODUCTS)  lansoprazole (PREVACID) 30 MG capsule TAKE 1 CAPSULE (30 MG TOTAL) BY MOUTH DAILY AT 12 NOON. REPLACE PANTOPRAZOLE   Multiple Vitamin (MULTIVITAMIN) tablet Take 1 tablet by mouth at bedtime.   ondansetron (ZOFRAN) 4 MG tablet Take 1 tablet (4 mg total) by mouth every 8 (eight) hours as needed for nausea or vomiting.   topiramate (TOPAMAX) 25 MG tablet Take 1-2 tablets (25-50 mg total) by mouth See admin instructions. Take 25 mg by mouth in the morning and 50 mg at bedtime   TRUEplus Lancets 33G MISC TEST BLOOD SUGAR  ONCE DAILY (NEED MD APPOINTMENT)   vitamin B-12 (CYANOCOBALAMIN) 1000 MCG tablet Take 1 tablet (1,000 mcg total) by mouth every Monday, Wednesday, and Friday.   Ubrogepant (UBRELVY) 50 MG TABS Take 1 tab at onset of migraine.  May repeat in 2 hrs, if needed.  Max dose: 2 tabs/day. This is a 30 day prescription. (Patient not taking: Reported on 10/17/2022)   No facility-administered encounter medications on file as of 10/17/2022.    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    Migraine    Stroke [facility])    Urge incontinence 04/29/2014   Past Surgical History:  Procedure Laterality Date   ABDOMINAL HYSTERECTOMY  2006   cervix remains, menorrhagia   APPENDECTOMY  1970   BREAST SURGERY  1995   biopsy-left   BREAST SURGERY  1995   right milk duct removed   CARPAL TUNNEL RELEASE Right 02/2017   CHOLECYSTECTOMY  1971   COLONOSCOPY  11/2005   diverticulosis o/w WNL [facility])   DEXA  2015   spine -0.4, hip -1.0 WNL   LITHOTRIPSY     LUMBAR EPIDURAL INJECTION Left 10/2017   transforaminal L L3/4 (Dr Ollen Bowl)   TUBAL LIGATION     Family History  Problem Relation Age of Onset   Heart disease Mother 61   Hyperlipidemia Mother    Hyperlipidemia Father    Stroke Father    Heart disease Father    Diabetes Father    Fibromyalgia Sister    Hyperlipidemia Sister    Heart disease Brother    Diabetes Brother    Social History   Socioeconomic History   Marital status: Married    Spouse name: Dana [name]   Number of children: 1   Years of education: 12    Highest education level: Not on file  Occupational History   Occupation: Retired    Comment: Retired  Tobacco Use   Smoking status: Never   Smokeless tobacco: Never  Vaping Use   Vaping Use: Never used  Substance and Sexual Activity   Alcohol use: No    Alcohol/week: 0.0 standard drinks of alcohol   Drug use: No   Sexual activity: Not Currently  Other Topics Concern   Not on file  Social History Narrative   Patient is married and lives at home with her [name]). Patient is retired.    Edu: high school education.   Right handed.   Exercise 3 times a week.   Diet: good  water, fruits/vegetables daily   Caffeine: None      HAs living will, HCPOA, full code   Social Determinants of Health   Financial Resource Strain: Low Risk  (10/17/2022)   Overall Financial Resource Strain (CARDIA)    Difficulty of Paying Living Expenses: Not hard at all  Food Insecurity: No Food Insecurity (10/17/2022)   Hunger Vital Sign    Worried About Running Out of Food in the Last Year: Never true    Ran Out of Food in the Last Year: Never true  Transportation Needs: No Transportation Needs (10/17/2022)   PRAPARE - Administrator, Civil Service (Medical): No    Lack of Transportation (Non-Medical): No  Physical Activity: Insufficiently Active (10/17/2022)   Exercise Vital Sign    Days of Exercise per Week: 3 days    Minutes of Exercise per Session: 10 min  Stress: No Stress Concern Present (10/17/2022)   [employer] of Occupational Health - Occupational Stress Questionnaire    Feeling of Stress : Not at all  Social Connections: Socially Integrated (10/17/2022)   Social Connection and Isolation Panel [NHANES]    Frequency of Communication with Friends and Family: More than three times a week    Frequency of Social Gatherings with Friends and Family: More than three times a week    Attends Religious Services: More than 4 times per year    Active Member of [employer] or  Organizations: Yes    Attends Engineer, structural: More than 4 times per year    Marital Status: Married    Tobacco Counseling Counseling given: Not Answered   Clinical Intake:  Pre-visit preparation completed: Yes  Pain : 0-10 Pain Score: 0-No pain     Nutritional Risks: None Diabetes: Yes CBG done?: Yes (111 per pt) CBG resulted in Enter/ Edit results?: No Did pt. bring in CBG monitor from home?: No  How often do you need to have someone help you when you read instructions, pamphlets, or other written materials from your doctor or pharmacy?: 1 - Never  Diabetic?Nutrition Risk Assessment:  Has the patient had any N/V/D within the last 2 months?  No  Does the patient have any non-healing wounds?  No  Has the patient had any unintentional weight loss or weight gain?  No   Diabetes:  Is the patient diabetic?  Yes  If diabetic, was a CBG obtained today?  Yes ,111 per pt Did the patient bring in their glucometer from home?  No  How often do you monitor your CBG's? 2 times a week.   Financial Strains and Diabetes Management:  Are you having any financial strains with the device, your supplies or your medication? No .  Does the patient want to be seen by Chronic Care Management for management of their diabetes?  No  Would the patient like to be referred to a Nutritionist or for Diabetic Management?  No   Diabetic Exams:  Diabetic Eye Exam: Completed 2023-[facility] Diabetic Foot Exam: Completed 07/27/22 PCP    Interpreter Needed?: No  Information entered by :: C.Trampas Stettner LPN   Activities of Daily Living    10/17/2022    8:52 AM  In your present state of health, do you have any difficulty performing the following activities:  Hearing? 0  Vision? 0  Difficulty concentrating or making decisions? 0  Walking or climbing stairs? 0  Dressing or bathing? 0  Doing errands, shopping? 0  Preparing Food and eating ? N  Using the Toilet? N  In the past  six months, have you accidently leaked urine? N  Do you have problems with loss of bowel control? N  Managing your Medications? N  Managing your Finances? N  Housekeeping or managing your Housekeeping? N    Patient Care Team: Eustaquio Boyden, MD as PCP - General (Family Medicine)  Indicate any recent Medical Services you may have received from other than [facility] providers in the past year (date may be approximate).     Assessment:   This is a routine wellness examination for Jashley.  Hearing/Vision screen Hearing Screening - Comments:: No hearing issues Vision Screening - Comments:: Readers - [facility]  Dietary issues and exercise activities discussed: Current Exercise Habits: The patient does not participate in regular exercise at present, Exercise limited by: None identified   Goals Addressed             This Visit's Progress    Patient Stated       Patient Stated       Move more       Depression Screen    10/17/2022    8:50 AM 10/05/2022   11:21 AM 08/10/2022   12:56 PM 07/27/2022    3:34 PM 10/13/2021    9:22 AM 11/26/2020    3:50 PM 10/06/2020    2:48 PM  PHQ 2/9 Scores  PHQ - 2 Score 0 0  0 0 1 2  PHQ- 9 Score 0 4     6  Exception Documentation   Patient refusal        Fall Risk    10/17/2022    8:52 AM 10/05/2022   11:21 AM 10/13/2021    9:25 AM 11/26/2020    3:49 PM 10/06/2020    2:08 PM  Fall Risk   Falls in the past year? 0 0 0 0 0  Number falls in past yr: 0  0 0   Injury with Fall? 0  0    Risk for fall due to : No Fall Risks  No Fall Risks    Follow up Falls prevention discussed;Falls evaluation completed        FALL RISK PREVENTION PERTAINING TO THE HOME:  Any stairs in or around the home? Yes  If so, are there any without handrails? No  Home free of loose throw rugs in walkways, pet beds, electrical cords, etc? Yes  Adequate lighting in your home to reduce risk of falls? Yes   ASSISTIVE DEVICES UTILIZED TO PREVENT FALLS:  Life alert? No   Use of a cane, walker or w/c? Yes  Grab bars in the bathroom? Yes  Shower chair or bench in shower? Yes  Elevated toilet seat or a handicapped toilet? Yes    Cognitive Function:    09/20/2019   10:32 AM 09/19/2018    1:03 PM 05/03/2017    2:40 PM  MMSE - Mini Mental State Exam  Orientation to time 5 5 5   Orientation to Place 5 5 5   Registration 3 3 3   Attention/ Calculation 5 0 0  Recall 3 3 3   Language- name 2 objects  0 0  Language- repeat 1 1 1   Language- follow 3 step command  0 3  Language- read & follow direction  0 0  Write a sentence  0 0  Copy design  0 0  Total score  17 20        10/17/2022    8:53 AM 10/13/2021  9:27 AM  6CIT Screen  What Year? 0 points 0 points  What month? 0 points 0 points  What time? 0 points 0 points  Count back from 20 0 points 0 points  Months in reverse 0 points 0 points  Repeat phrase 0 points 0 points  Total Score 0 points 0 points    Immunizations Immunization History  Administered Date(s) Administered   Fluad Quad(high Dose 65+) 01/23/2019, 02/13/2020   Influenza, High Dose Seasonal PF 01/28/2015, 01/26/2021, 01/13/2022   Influenza,inj,Quad PF,6+ Mos 01/09/2013, 01/22/2016, 02/01/2017, 02/15/2018   Influenza-Unspecified 02/18/2014, 02/07/2016, 02/06/2017   MODERNA COVID-19 SARS-COV-2 PEDS BIVALENT BOOSTER 6Y-11Y 02/23/2021   Moderna SARS-COV2 Booster Vaccination 03/16/2020, 08/18/2020   Moderna Sars-Covid-2 Vaccination 05/31/2019, 06/27/2019   Pneumococcal Conjugate-13 04/29/2014   Pneumococcal Polysaccharide-23 05/03/2016   Pneumococcal-Unspecified 05/09/2010   Zoster Recombinat (Shingrix) 02/09/2018, 05/22/2018   Zoster, Live 05/09/2008    TDAP status: Up to date  Flu Vaccine status: Up to date  Pneumococcal vaccine status: Up to date  Covid-19 vaccine status: Information provided on how to obtain vaccines.   Qualifies for Shingles Vaccine? Yes   Zostavax completed  unknown   Shingrix Completed?:  Yes  Screening Tests Health Maintenance  Topic Date Due   DTaP/Tdap/Td (1 - Tdap) Never done   OPHTHALMOLOGY EXAM  04/13/2021   COVID-19 Vaccine (6 - 2023-24 season) 01/07/2022   Fecal DNA (Cologuard)  05/27/2022   INFLUENZA VACCINE  12/08/2022   Diabetic kidney evaluation - eGFR measurement  01/22/2023   Diabetic kidney evaluation - Urine ACR  01/22/2023   HEMOGLOBIN A1C  01/27/2023   MAMMOGRAM  05/11/2023   FOOT EXAM  07/27/2023   Medicare Annual Wellness (AWV)  10/17/2023   Pneumonia Vaccine 19+ Years old  Completed   DEXA SCAN  Completed   Hepatitis C Screening  Completed   Zoster Vaccines- Shingrix  Completed   HPV VACCINES  Aged Out    Health Maintenance  Health Maintenance Due  Topic Date Due   DTaP/Tdap/Td (1 - Tdap) Never done   OPHTHALMOLOGY EXAM  04/13/2021   COVID-19 Vaccine (6 - 2023-24 season) 01/07/2022   Fecal DNA (Cologuard)  05/27/2022    Colorectal cancer screening: No longer required.   Mammogram status: Completed 05/10/2022. Repeat every year  Bone Density status: Completed 05/07/2014. Results reflect: Bone density results: NORMAL. Repeat every 5 years.   Lung Cancer Screening: (Low Dose CT Chest recommended if Age 26-80 years, 30 pack-year currently smoking OR have quit w/in 15years.) does not qualify.   Lung Cancer Screening Referral: no  Additional Screening:  Hepatitis C Screening: does qualify; Completed 05/01/15  Vision Screening: Recommended annual ophthalmology exams for early detection of glaucoma and other disorders of the eye. Is the patient up to date with their annual eye exam?  Yes  Who is the provider or what is the name of the office in which the patient attends annual eye exams? [facility] If pt is not established with a provider, would they like to be referred to a provider to establish care? Yes .   Dental Screening: Recommended annual dental exams for proper oral hygiene  Community Resource Referral / Chronic Care  Management: CRR required this visit?  No   CCM required this visit?  No      Plan:     I have personally reviewed and noted the following in the patient's chart:   Medical and social history Use of alcohol, tobacco or illicit drugs  Current medications and  supplements including opioid prescriptions. Patient is not currently taking opioid prescriptions. Functional ability and status Nutritional status Physical activity Advanced directives List of other physicians Hospitalizations, surgeries, and ER visits in previous 12 months Vitals Screenings to include cognitive, depression, and falls Referrals and appointments  In addition, I have reviewed and discussed with patient certain preventive protocols, quality metrics, and best practice recommendations. A written personalized care plan for preventive services as well as general preventive health recommendations were provided to patient.     Maryan Puls, LPN   1/61/0960   Nurse Notes: order placed for dexa scan.

## 2022-10-17 NOTE — Patient Instructions (Signed)
[name] , Thank you for taking time to come for your Medicare Wellness Visit. I appreciate your ongoing commitment to your health goals. Please review the following plan we discussed and let me know if I can assist you in the future.   These are the goals we discussed:  Goals       Increase physical activity (pt-stated)      I would like to get back to gym, also find anew house.      Patient Stated      09/20/2019, I will continue to walk 1,000-2,000 steps everyday.      Patient Stated      Patient Stated      Move more        This is a list of the screening recommended for you and due dates:  Health Maintenance  Topic Date Due   DTaP/Tdap/Td vaccine (1 - Tdap) Never done   Eye exam for diabetics  04/13/2021   COVID-19 Vaccine (6 - 2023-24 season) 01/07/2022   Cologuard (Stool DNA test)  05/27/2022   Flu Shot  12/08/2022   Yearly kidney function blood test for diabetes  01/22/2023   Yearly kidney health urinalysis for diabetes  01/22/2023   Hemoglobin A1C  01/27/2023   Mammogram  05/11/2023   Complete foot exam   07/27/2023   Medicare Annual Wellness Visit  10/17/2023   Pneumonia Vaccine  Completed   DEXA scan (bone density measurement)  Completed   Hepatitis C Screening  Completed   Zoster (Shingles) Vaccine  Completed   HPV Vaccine  Aged Out    Advanced directives: has copy in chart.  Conditions/risks identified: Aim for 30 minutes of exercise or brisk walking, 6-8 glasses of water, and 5 servings of fruits and vegetables each day.   Next appointment: Follow up in one year for your annual wellness visit 10/18/23 @ 8:45 televisit   Preventive Care 65 Years and Older, Female Preventive care refers to lifestyle choices and visits with your health care provider that can promote health and wellness. What does preventive care include? A yearly physical exam. This is also called an annual well check. Dental exams once or twice a year. Routine eye exams. Ask your health  care provider how often you should have your eyes checked. Personal lifestyle choices, including: Daily care of your teeth and gums. Regular physical activity. Eating a healthy diet. Avoiding tobacco and drug use. Limiting alcohol use. Practicing safe sex. Taking low-dose aspirin every day. Taking vitamin and mineral supplements as recommended by your health care provider. What happens during an annual well check? The services and screenings done by your health care provider during your annual well check will depend on your age, overall health, lifestyle risk factors, and family history of disease. Counseling  Your health care provider may ask you questions about your: Alcohol use. Tobacco use. Drug use. Emotional well-being. Home and relationship well-being. Sexual activity. Eating habits. History of falls. Memory and ability to understand (cognition). Work and work Astronomer. Reproductive health. Screening  You may have the following tests or measurements: Height, weight, and BMI. Blood pressure. Lipid and cholesterol levels. These may be checked every 5 years, or more frequently if you are over 27 years old. Skin check. Lung cancer screening. You may have this screening every year starting at age 65 if you have a 30-pack-year history of smoking and currently smoke or have quit within the past 15 years. Fecal occult blood test (FOBT) of the stool.  You may have this test every year starting at age 10. Flexible sigmoidoscopy or colonoscopy. You may have a sigmoidoscopy every 5 years or a colonoscopy every 10 years starting at age 64. Hepatitis C blood test. Hepatitis B blood test. Sexually transmitted disease (STD) testing. Diabetes screening. This is done by checking your blood sugar (glucose) after you have not eaten for a while (fasting). You may have this done every 1-3 years. Bone density scan. This is done to screen for osteoporosis. You may have this done starting at  age 36. Mammogram. This may be done every 1-2 years. Talk to your health care provider about how often you should have regular mammograms. Talk with your health care provider about your test results, treatment options, and if necessary, the need for more tests. Vaccines  Your health care provider may recommend certain vaccines, such as: Influenza vaccine. This is recommended every year. Tetanus, diphtheria, and acellular pertussis (Tdap, Td) vaccine. You may need a Td booster every 10 years. Zoster vaccine. You may need this after age 5. Pneumococcal 13-valent conjugate (PCV13) vaccine. One dose is recommended after age 67. Pneumococcal polysaccharide (PPSV23) vaccine. One dose is recommended after age 43. Talk to your health care provider about which screenings and vaccines you need and how often you need them. This information is not intended to replace advice given to you by your health care provider. Make sure you discuss any questions you have with your health care provider. Document Released: 05/22/2015 Document Revised: 01/13/2016 Document Reviewed: 02/24/2015 Elsevier Interactive Patient Education  2017 [employer].  Fall Prevention in the Home Falls can cause injuries. They can happen to people of all ages. There are many things you can do to make your home safe and to help prevent falls. What can I do on the outside of my home? Regularly fix the edges of walkways and driveways and fix any cracks. Remove anything that might make you trip as you walk through a door, such as a raised step or threshold. Trim any bushes or trees on the path to your home. Use bright outdoor lighting. Clear any walking paths of anything that might make someone trip, such as rocks or tools. Regularly check to see if handrails are loose or broken. Make sure that both sides of any steps have handrails. Any raised decks and porches should have guardrails on the edges. Have any leaves, snow, or ice cleared  regularly. Use sand or salt on walking paths during winter. Clean up any spills in your garage right away. This includes oil or grease spills. What can I do in the bathroom? Use night lights. Install grab bars by the toilet and in the tub and shower. Do not use towel bars as grab bars. Use non-skid mats or decals in the tub or shower. If you need to sit down in the shower, use a plastic, non-slip stool. Keep the floor dry. Clean up any water that spills on the floor as soon as it happens. Remove soap buildup in the tub or shower regularly. Attach bath mats securely with double-sided non-slip rug tape. Do not have throw rugs and other things on the floor that can make you trip. What can I do in the bedroom? Use night lights. Make sure that you have a light by your bed that is easy to reach. Do not use any sheets or blankets that are too big for your bed. They should not hang down onto the floor. Have a firm chair that has  side arms. You can use this for support while you get dressed. Do not have throw rugs and other things on the floor that can make you trip. What can I do in the kitchen? Clean up any spills right away. Avoid walking on wet floors. Keep items that you use a lot in easy-to-reach places. If you need to reach something above you, use a strong step stool that has a grab bar. Keep electrical cords out of the way. Do not use floor polish or wax that makes floors slippery. If you must use wax, use non-skid floor wax. Do not have throw rugs and other things on the floor that can make you trip. What can I do with my stairs? Do not leave any items on the stairs. Make sure that there are handrails on both sides of the stairs and use them. Fix handrails that are broken or loose. Make sure that handrails are as long as the stairways. Check any carpeting to make sure that it is firmly attached to the stairs. Fix any carpet that is loose or worn. Avoid having throw rugs at the top or  bottom of the stairs. If you do have throw rugs, attach them to the floor with carpet tape. Make sure that you have a light switch at the top of the stairs and the bottom of the stairs. If you do not have them, ask someone to add them for you. What else can I do to help prevent falls? Wear shoes that: Do not have high heels. Have rubber bottoms. Are comfortable and fit you well. Are closed at the toe. Do not wear sandals. If you use a stepladder: Make sure that it is fully opened. Do not climb a closed stepladder. Make sure that both sides of the stepladder are locked into place. Ask someone to hold it for you, if possible. Clearly mark and make sure that you can see: Any grab bars or handrails. First and last steps. Where the edge of each step is. Use tools that help you move around (mobility aids) if they are needed. These include: Canes. Walkers. Scooters. Crutches. Turn on the lights when you go into a dark area. Replace any light bulbs as soon as they burn out. Set up your furniture so you have a clear path. Avoid moving your furniture around. If any of your floors are uneven, fix them. If there are any pets around you, be aware of where they are. Review your medicines with your doctor. Some medicines can make you feel dizzy. This can increase your chance of falling. Ask your doctor what other things that you can do to help prevent falls. This information is not intended to replace advice given to you by your health care provider. Make sure you discuss any questions you have with your health care provider. Document Released: 02/19/2009 Document Revised: 10/01/2015 Document Reviewed: 05/30/2014 Elsevier Interactive Patient Education  2017 [employer].

## 2022-10-18 ENCOUNTER — Ambulatory Visit
Admission: RE | Admit: 2022-10-18 | Discharge: 2022-10-18 | Disposition: A | Discharge status: Home / Self Care | Payer: Medicare HMO | Source: Ambulatory Visit | Attending: Family Medicine | Admitting: Family Medicine

## 2022-10-18 ENCOUNTER — Ambulatory Visit (INDEPENDENT_AMBULATORY_CARE_PROVIDER_SITE_OTHER): Payer: Medicare HMO | Admitting: Family Medicine

## 2022-10-18 ENCOUNTER — Encounter: Payer: Self-pay | Admitting: Family Medicine

## 2022-10-18 VITALS — BP 124/66 | HR 68 | Temp 98.0°F | Ht 62.0 in | Wt 166.4 lb

## 2022-10-18 DIAGNOSIS — M7989 Other specified soft tissue disorders: Secondary | ICD-10-CM | POA: Diagnosis not present

## 2022-10-18 DIAGNOSIS — M79661 Pain in right lower leg: Secondary | ICD-10-CM | POA: Insufficient documentation

## 2022-10-18 DIAGNOSIS — E1169 Type 2 diabetes mellitus with other specified complication: Secondary | ICD-10-CM | POA: Diagnosis not present

## 2022-10-18 DIAGNOSIS — M79672 Pain in left foot: Secondary | ICD-10-CM

## 2022-10-18 DIAGNOSIS — M79605 Pain in left leg: Secondary | ICD-10-CM | POA: Diagnosis not present

## 2022-10-18 DIAGNOSIS — M79604 Pain in right leg: Secondary | ICD-10-CM

## 2022-10-18 DIAGNOSIS — M79671 Pain in right foot: Secondary | ICD-10-CM

## 2022-10-18 NOTE — Patient Instructions (Addendum)
Repeat labs today.  We will proceed with right leg venous ultrasound. I will work on diabetic shoe form

## 2022-10-18 NOTE — Progress Notes (Signed)
Ph: [phone] Fax: [phone]   Patient ID: Dana [name], female    DOB: 03/13/1946, 77 y.o.   MRN: [id number]  This visit was conducted in person.  BP 124/66   Pulse 68   Temp 98 F (36.7 C) (Temporal)   Ht 5\' 2"  (1.575 m)   Wt 166 lb 6 oz (75.5 kg)   SpO2 97%   BMI 30.43 kg/m    CC: check foot exam  Subjective:   HPI: Dana [name] is a 77 y.o. female presenting on 10/18/2022 for Medical Management of Chronic Issues (Here for DM foot exam. Provided form to be competed for diabetic shoes. )   Lives in [place].   Ongoing leg pain and swelling. Initially right leg, now left leg is worse and more painful. Swelling extends to the knee. Pain behind the knee as well as to the calf. No numbness, paresthesias or burning pain. R foot toes swelling without pain.  No fevers/chills.  No other joints affected.   She had negative venous US to left leg 09/09/2022 at [facility] (study reviewed in CareEverywhere).   ABIs scheduled for 10/26/2022 in [place] - was unable to have this done at [facility].   Requests form for diabetic shoes through [facility].  Lab Results  Component Value Date   HGBA1C 6.4 (A) 07/27/2022  H/o diet controlled diabetes with A1c up to 6.6%.  Cbg yesterday 111, today 108 Diabetic Foot Exam - Simple   Simple Foot Form Diabetic Foot exam was performed with the following findings: Yes 10/18/2022  4:00 PM  Visual Inspection See comments: Yes Sensation Testing Intact to touch and monofilament testing bilaterally: Yes Pulse Check See comments: Yes Comments Diminished pulses to right DP  2+ DP on left Tender corn/callus/plantar wart to right sole - wearing callus pad          Relevant past medical, surgical, family and social history reviewed and updated as indicated. Interim medical history since our last visit reviewed. Allergies and medications reviewed and updated. Outpatient Medications Prior to Visit   Medication Sig Dispense Refill   acetaminophen (TYLENOL) 325 MG tablet Take 1 tablet (325 mg total) by mouth every 6 (six) hours as needed for mild pain.     Alcohol Swabs (DROPSAFE ALCOHOL PREP) 70 % PADS USE TO TEST BLOOD SUGAR ONCE DAILY (NEED MD APPOINTMENT) 100 each 3   atorvastatin (LIPITOR) 40 MG tablet Take 1 tablet (40 mg total) by mouth at bedtime. 90 tablet 3   benzonatate (TESSALON) 200 MG capsule Take 1 capsule (200 mg total) by mouth 3 (three) times daily as needed for cough. 30 capsule 1   Blood Glucose Calibration (TRUE METRIX LEVEL 1) Low SOLN USE TO CHECK METER 1 each 0   Blood Glucose Monitoring Suppl (TRUE METRIX AIR GLUCOSE METER) w/Device KIT USE AS INSTRUCTED TO CHECK BLOOD SUGAR ONCE A DAY 1 kit 0   butalbital-acetaminophen-caffeine (FIORICET) 50-325-40 MG tablet Take 1 tablet by mouth as needed for headache. TAKE 1 TABLET EVERY 6 HOURS AS NEEDED FOR HEADACHE 10 tablet 3   clopidogrel (PLAVIX) 75 MG tablet TAKE 1 TABLET (75 MG TOTAL) BY MOUTH DAILY. 90 tablet 3   co-enzyme Q-10 30 MG capsule Take 100 mg by mouth daily.     fluticasone (FLONASE) 50 MCG/ACT nasal spray USE 2 SPRAYS IN EACH NOSTRIL EVERY DAY 48 g 3   glucose blood (TRUE METRIX BLOOD GLUCOSE TEST) test strip TEST  BLOOD SUGAR ONE TIME DAILY 100 strip 3   lactase (LACTAID) 3000 units tablet Take 2 tablets (6,000 Units total) by mouth 3 (three) times daily with meals. (Patient taking differently: Take 6,000 Units by mouth See admin instructions. Take 6,000 units (2 tablets) by mouth up to three times a day WITH DAIRY PRODUCTS)     lansoprazole (PREVACID) 30 MG capsule TAKE 1 CAPSULE (30 MG TOTAL) BY MOUTH DAILY AT 12 NOON. REPLACE PANTOPRAZOLE 14 capsule 0   Multiple Vitamin (MULTIVITAMIN) tablet Take 1 tablet by mouth at bedtime.     ondansetron (ZOFRAN) 4 MG tablet Take 1 tablet (4 mg total) by mouth every 8 (eight) hours as needed for nausea or vomiting. 20 tablet 0   topiramate (TOPAMAX) 25 MG tablet Take 1-2  tablets (25-50 mg total) by mouth See admin instructions. Take 25 mg by mouth in the morning and 50 mg at bedtime 180 tablet 1   TRUEplus Lancets 33G MISC TEST BLOOD SUGAR ONCE DAILY (NEED MD APPOINTMENT) 100 each 3   Ubrogepant (UBRELVY) 50 MG TABS Take 1 tab at onset of migraine.  May repeat in 2 hrs, if needed.  Max dose: 2 tabs/day. This is a 30 day prescription. 12 tablet 11   vitamin B-12 (CYANOCOBALAMIN) 1000 MCG tablet Take 1 tablet (1,000 mcg total) by mouth every Monday, Wednesday, and Friday.     No facility-administered medications prior to visit.     Per HPI unless specifically indicated in ROS section below Review of Systems  Objective:  BP 124/66   Pulse 68   Temp 98 F (36.7 C) (Temporal)   Ht 5\' 2"  (1.575 m)   Wt 166 lb 6 oz (75.5 kg)   SpO2 97%   BMI 30.43 kg/m   Wt Readings from Last 3 Encounters:  10/18/22 166 lb 6 oz (75.5 kg)  10/17/22 164 lb (74.4 kg)  10/05/22 164 lb 4 oz (74.5 kg)      Physical Exam Vitals and nursing note reviewed.  Constitutional:      Appearance: Normal appearance. She is not ill-appearing.     Comments: Ambulates with cane  Cardiovascular:     Rate and Rhythm: Normal rate and regular rhythm.     Pulses: Normal pulses.     Heart sounds: Normal heart sounds. No murmur heard. Pulmonary:     Effort: Pulmonary effort is normal. No respiratory distress.     Breath sounds: Normal breath sounds. No wheezing, rhonchi or rales.  Musculoskeletal:        General: Swelling and tenderness (bilateral lower legs to palpation R>L) present.     Right lower leg: Edema (1+) present.     Left lower leg: Edema (tr) present.     Comments:  R calf circ 39cm L calf circ 38cm 2+ DP on left Diminished DP on right  Skin:    General: Skin is warm and dry.     Findings: Erythema (RLE) present. No rash.  Neurological:     Mental Status: She is alert.  Psychiatric:        Mood and Affect: Mood normal.        Behavior: Behavior normal.        Results for orders placed or performed in visit on 10/18/22  CBC with Differential/Platelet  Result Value Ref Range   WBC 4.9 4.0 - 10.5 K/uL   RBC 3.84 (L) 3.87 - 5.11 Mil/uL   Hemoglobin 11.5 (L) 12.0 - 15.0 g/dL   HCT 16.1 (  L) 36.0 - 46.0 %   MCV 92.6 78.0 - 100.0 fl   MCHC 32.4 30.0 - 36.0 g/dL   RDW 16.1 09.6 - 04.5 %   Platelets 243.0 150.0 - 400.0 K/uL   Neutrophils Relative % 44.4 43.0 - 77.0 %   Lymphocytes Relative 34.9 12.0 - 46.0 %   Monocytes Relative 19.5 Repeated and verified X2. (H) 3.0 - 12.0 %   Eosinophils Relative 0.4 0.0 - 5.0 %   Basophils Relative 0.8 0.0 - 3.0 %   Neutro Abs 2.2 1.4 - 7.7 K/uL   Lymphs Abs 1.7 0.7 - 4.0 K/uL   Monocytes Absolute 1.0 0.1 - 1.0 K/uL   Eosinophils Absolute 0.0 0.0 - 0.7 K/uL   Basophils Absolute 0.0 0.0 - 0.1 K/uL  Basic metabolic panel  Result Value Ref Range   Sodium 145 135 - 145 mEq/L   Potassium 4.1 3.5 - 5.1 mEq/L   Chloride 111 96 - 112 mEq/L   CO2 25 19 - 32 mEq/L   Glucose, Bld 82 70 - 99 mg/dL   BUN 18 6 - 23 mg/dL   Creatinine, Ser 4.09 0.40 - 1.20 mg/dL   GFR 81.19 >14.78 mL/min   Calcium 9.3 8.4 - 10.5 mg/dL  Sedimentation rate  Result Value Ref Range   Sed Rate 11 0 - 30 mm/hr  C-reactive protein  Result Value Ref Range   CRP <1.0 0.5 - 20.0 mg/dL  Uric acid  Result Value Ref Range   Uric Acid, Serum 4.1 2.4 - 7.0 mg/dL    Assessment & Plan:   Problem List Items Addressed This Visit     Type 2 diabetes mellitus with other specified complication (HCC)    Chronic, diet controlled, A1c remains controlled at 6.4%.  Significant leg pain/swelling.  Evidence of diminished pulses on right pending PAD evaluation.  Reasonable to use diabetic shoes - will fax request to [facility] per pt request.  See below re more acute R leg pain/swelling.       Bilateral pain of leg and foot    Ongoing difficulty for months, has seen our office several times as well as podiatry s/p negative LLE venous  US 09/2022 through [facility].  ?L foot metatarsal stress fracture, initially treated as such with post-op shoe (no significant benefit) as well as prednisone taper (seemed to help).  Now with R leg pain, swelling, warmth, redness and diminished pulses on right - pending PAD evaluation. If normal, will start compression stockings, consider VVS evaluation.  Will further evaluate with repeat labwork today (inflammatory markers, urate, CBC, BMP).  See below re/ r/o DVT given recent worsening calf pain/swelling to RLE.       Relevant Orders   Sedimentation rate (Completed)   C-reactive protein (Completed)   Uric acid (Completed)   Pain and swelling of right lower leg - Primary    See below.  Check labs, check venous US r/o DVT. Neg eval for gout. Previous benefit with prednisone course.  Symptoms not consistent with neuropathy.  Eval for PAD, if normal consider compression stockings and VVS eval. Consider empiric treatment for cellulitis.       Relevant Orders   CBC with Differential/Platelet (Completed)   Basic metabolic panel (Completed)   Sedimentation rate (Completed)   C-reactive protein (Completed)   Uric acid (Completed)   US Venous Img Lower Unilateral Right (DVT) (Completed)     No orders of the defined types were placed in  this encounter.   Orders Placed This Encounter  Procedures   US Venous Img Lower Unilateral Right (DVT)    Standing Status:   Future    Number of Occurrences:   1    Standing Expiration Date:   10/18/2023    Order Specific Question:   Reason for Exam (SYMPTOM  OR DIAGNOSIS REQUIRED)    Answer:   RLE pain and swelling x 2-3 weeks    Order Specific Question:   Preferred imaging location?    Answer:   [facility]   CBC with Differential/Platelet   Basic metabolic panel   Sedimentation rate   C-reactive protein   Uric acid    Patient Instructions  Repeat labs today.  We will proceed with right leg venous ultrasound. I  will work on diabetic shoe form  Follow up plan: Return if symptoms worsen or fail to improve.  Eustaquio Boyden, MD

## 2022-10-19 LAB — CBC WITH DIFFERENTIAL/PLATELET
Basophils Absolute: 0 10*3/uL (ref 0.0–0.1)
Basophils Relative: 0.8 % (ref 0.0–3.0)
Eosinophils Absolute: 0 10*3/uL (ref 0.0–0.7)
Eosinophils Relative: 0.4 % (ref 0.0–5.0)
HCT: 35.5 % — ABNORMAL LOW (ref 36.0–46.0)
Hemoglobin: 11.5 g/dL — ABNORMAL LOW (ref 12.0–15.0)
Lymphocytes Relative: 34.9 % (ref 12.0–46.0)
Lymphs Abs: 1.7 10*3/uL (ref 0.7–4.0)
MCHC: 32.4 g/dL (ref 30.0–36.0)
MCV: 92.6 fl (ref 78.0–100.0)
Monocytes Absolute: 1 10*3/uL (ref 0.1–1.0)
Monocytes Relative: 19.5 % — ABNORMAL HIGH (ref 3.0–12.0)
Neutro Abs: 2.2 10*3/uL (ref 1.4–7.7)
Neutrophils Relative %: 44.4 % (ref 43.0–77.0)
Platelets: 243 10*3/uL (ref 150.0–400.0)
RBC: 3.84 Mil/uL — ABNORMAL LOW (ref 3.87–5.11)
RDW: 14.9 % (ref 11.5–15.5)
WBC: 4.9 10*3/uL (ref 4.0–10.5)

## 2022-10-19 LAB — BASIC METABOLIC PANEL
BUN: 18 mg/dL (ref 6–23)
CO2: 25 mEq/L (ref 19–32)
Calcium: 9.3 mg/dL (ref 8.4–10.5)
Chloride: 111 mEq/L (ref 96–112)
Creatinine, Ser: 0.82 mg/dL (ref 0.40–1.20)
GFR: 69.82 mL/min (ref >60.00)
Glucose, Bld: 82 mg/dL (ref 70–99)
Potassium: 4.1 mEq/L (ref 3.5–5.1)
Sodium: 145 mEq/L (ref 135–145)

## 2022-10-19 LAB — URIC ACID: Uric Acid, Serum: 4.1 mg/dL (ref 2.4–7.0)

## 2022-10-19 LAB — SEDIMENTATION RATE: Sed Rate: 11 mm/hr (ref 0–30)

## 2022-10-19 LAB — C-REACTIVE PROTEIN: CRP: <1 mg/dL (ref 0.5–20.0)

## 2022-10-20 ENCOUNTER — Other Ambulatory Visit: Payer: Self-pay | Admitting: Family Medicine

## 2022-10-20 ENCOUNTER — Encounter: Payer: Self-pay | Admitting: Family Medicine

## 2022-10-20 DIAGNOSIS — M79661 Pain in right lower leg: Secondary | ICD-10-CM | POA: Insufficient documentation

## 2022-10-20 DIAGNOSIS — D72821 Monocytosis (symptomatic): Secondary | ICD-10-CM | POA: Insufficient documentation

## 2022-10-20 DIAGNOSIS — M7989 Other specified soft tissue disorders: Secondary | ICD-10-CM | POA: Insufficient documentation

## 2022-10-20 MED ORDER — CEPHALEXIN 500 MG PO CAPS
500.0000 mg | ORAL_CAPSULE | Freq: Three times a day (TID) | ORAL | 0 refills | Status: DC
Start: 2022-10-20 — End: 2022-12-12

## 2022-10-20 NOTE — Assessment & Plan Note (Signed)
See below.  Check labs, check venous US r/o DVT. Neg eval for gout. Previous benefit with prednisone course.  Symptoms not consistent with neuropathy.  Eval for PAD, if normal consider compression stockings and VVS eval. Consider empiric treatment for cellulitis.

## 2022-10-20 NOTE — Assessment & Plan Note (Signed)
Chronic, diet controlled, A1c remains controlled at 6.4%.  Significant leg pain/swelling.  Evidence of diminished pulses on right pending PAD evaluation.  Reasonable to use diabetic shoes - will fax request to [facility] per pt request.  See below re more acute R leg pain/swelling.

## 2022-10-20 NOTE — Assessment & Plan Note (Addendum)
Ongoing difficulty for months, has seen our office several times as well as podiatry s/p negative LLE venous US 09/2022 through [facility].  ?L foot metatarsal stress fracture, initially treated as such with post-op shoe (no significant benefit) as well as prednisone taper (seemed to help).  Now with R leg pain, swelling, warmth, redness and diminished pulses on right - pending PAD evaluation. If normal, will start compression stockings, consider VVS evaluation.  Will further evaluate with repeat labwork today (inflammatory markers, urate, CBC, BMP).  See below re/ r/o DVT given recent worsening calf pain/swelling to RLE.

## 2022-10-21 DIAGNOSIS — Z9841 Cataract extraction status, right eye: Secondary | ICD-10-CM | POA: Diagnosis not present

## 2022-10-21 DIAGNOSIS — Z9842 Cataract extraction status, left eye: Secondary | ICD-10-CM | POA: Diagnosis not present

## 2022-10-21 DIAGNOSIS — H52223 Regular astigmatism, bilateral: Secondary | ICD-10-CM | POA: Diagnosis not present

## 2022-10-21 DIAGNOSIS — H16223 Keratoconjunctivitis sicca, not specified as Sjogren's, bilateral: Secondary | ICD-10-CM | POA: Diagnosis not present

## 2022-10-21 LAB — HM DIABETES EYE EXAM

## 2022-10-26 ENCOUNTER — Ambulatory Visit (HOSPITAL_COMMUNITY)
Admission: RE | Admit: 2022-10-26 | Discharge: 2022-10-26 | Disposition: A | Discharge status: Home / Self Care | Payer: Medicare HMO | Source: Ambulatory Visit | Attending: Family Medicine | Admitting: Family Medicine

## 2022-10-26 DIAGNOSIS — R0989 Other specified symptoms and signs involving the circulatory and respiratory systems: Secondary | ICD-10-CM | POA: Diagnosis not present

## 2022-10-26 DIAGNOSIS — M79672 Pain in left foot: Secondary | ICD-10-CM | POA: Insufficient documentation

## 2022-10-26 DIAGNOSIS — M79605 Pain in left leg: Secondary | ICD-10-CM | POA: Insufficient documentation

## 2022-10-26 DIAGNOSIS — M79604 Pain in right leg: Secondary | ICD-10-CM | POA: Insufficient documentation

## 2022-10-26 DIAGNOSIS — M79671 Pain in right foot: Secondary | ICD-10-CM | POA: Insufficient documentation

## 2022-11-01 LAB — VAS US ABI WITH/WO TBI: Left ABI: 1.2

## 2022-11-07 LAB — VAS US ABI WITH/WO TBI: Right ABI: 1.25

## 2022-11-17 ENCOUNTER — Telehealth: Payer: Self-pay | Admitting: Family Medicine

## 2022-11-17 DIAGNOSIS — M79661 Pain in right lower leg: Secondary | ICD-10-CM

## 2022-11-17 DIAGNOSIS — M79604 Pain in right leg: Secondary | ICD-10-CM

## 2022-11-17 NOTE — Telephone Encounter (Signed)
Patient would like to know if order for her to have bone density test have been sent out to [facility]?She would also like to know if Dr Reece Agar is still going to send her a referral out to see a leg specialist?She stated that when she came in with her husband for an appointment ,she discussed these things with Dr Reece Agar.

## 2022-11-18 NOTE — Addendum Note (Signed)
Addended by: Eustaquio Boyden on: 11/18/2022 01:43 AM   Modules accepted: Orders

## 2022-11-18 NOTE — Telephone Encounter (Signed)
See result note section for recent ABIs - I don't see where she viewed results: "Your leg artery evaluation returned reassuringly normal. Try compression stockings as discussed, and if no better we could refer you to the vascular doctor for evaluation."  If she doesn't use mychart, let us know to  deactivate account.   I will go ahead and place VVS referral to [facility] for ongoing leg pain.  Misty Stanley - can you fax DEXA order in chart to [facility]? Thanks.

## 2022-11-18 NOTE — Telephone Encounter (Signed)
Faxed bone density order to [facility] at [phone].  Lvm asking pt to call back. Need to notify pt of above info and relay Dr. Timoteo Expose message.

## 2022-11-18 NOTE — Telephone Encounter (Signed)
Patient called in and relayed message below. Patient stated that she has been wearing the compression stockings for 3 days. She stated that it is helping with the swelling but not the ankle pain. She stated that when walking around it is still painful. Informed her that referral was placed and someone should contact her to be scheduled. Thank you!

## 2022-11-24 DIAGNOSIS — E1142 Type 2 diabetes mellitus with diabetic polyneuropathy: Secondary | ICD-10-CM | POA: Diagnosis not present

## 2022-12-05 DIAGNOSIS — N958 Other specified menopausal and perimenopausal disorders: Secondary | ICD-10-CM | POA: Diagnosis not present

## 2022-12-05 DIAGNOSIS — M8588 Other specified disorders of bone density and structure, other site: Secondary | ICD-10-CM | POA: Diagnosis not present

## 2022-12-05 DIAGNOSIS — E349 Endocrine disorder, unspecified: Secondary | ICD-10-CM | POA: Diagnosis not present

## 2022-12-11 ENCOUNTER — Other Ambulatory Visit: Payer: Self-pay | Admitting: Neurology

## 2022-12-12 ENCOUNTER — Ambulatory Visit (INDEPENDENT_AMBULATORY_CARE_PROVIDER_SITE_OTHER): Payer: Medicare HMO | Admitting: Internal Medicine

## 2022-12-12 ENCOUNTER — Encounter: Payer: Self-pay | Admitting: Internal Medicine

## 2022-12-12 ENCOUNTER — Encounter: Payer: Self-pay | Admitting: Family Medicine

## 2022-12-12 VITALS — BP 138/80 | HR 79 | Temp 97.7°F | Ht 62.0 in | Wt 164.0 lb

## 2022-12-12 DIAGNOSIS — M79605 Pain in left leg: Secondary | ICD-10-CM | POA: Insufficient documentation

## 2022-12-12 DIAGNOSIS — M8580 Other specified disorders of bone density and structure, unspecified site: Secondary | ICD-10-CM | POA: Insufficient documentation

## 2022-12-12 NOTE — Assessment & Plan Note (Signed)
No sig findings today Still likely from venous disease Explained that she should use the support hose every day--even if legs look okay in the morning Is avoiding salt Asks about diuretic--told her I would defer that for now unless she has edema first thing in the morning

## 2022-12-12 NOTE — Patient Instructions (Signed)
Please use the support hose every morning--put them on first thing in the morning even if your legs look normal. We could consider a fluid pill if you are having swelling in your legs even upon waking up

## 2022-12-12 NOTE — Progress Notes (Signed)
Subjective:    Patient ID: Dana [name], female    DOB: 1945-12-26, 77 y.o.   MRN: [id number]  HPI Here due to left leg pain  Having pain in both legs--but left leg is bothering her more now Had ABIs---fine No DVT in right leg--when checked 2 months ago Also due for another vascular test in September  Yesterday, had sudden pain in left leg--and couldn't walk Bad cramp feeling 2 days ago Some better today--walking with rollator. Used methocarbamol and arnica gel yesterday (seemed to help)  No falls or recent injury No recent travel  Did put compression hose on left leg--after the cramp 2 days ago. May have helped Didn't use it yesterday due to pain upon standing Not using the hose on regular basis--because pain/redness had gone away  Does have some swelling--but mostly later in the day  Current Outpatient Medications on File Prior to Visit  Medication Sig Dispense Refill   acetaminophen (TYLENOL) 325 MG tablet Take 1 tablet (325 mg total) by mouth every 6 (six) hours as needed for mild pain.     Alcohol Swabs (DROPSAFE ALCOHOL PREP) 70 % PADS USE TO TEST BLOOD SUGAR ONCE DAILY (NEED MD APPOINTMENT) 100 each 3   atorvastatin (LIPITOR) 40 MG tablet Take 1 tablet (40 mg total) by mouth at bedtime. 90 tablet 3   benzonatate (TESSALON) 200 MG capsule Take 1 capsule (200 mg total) by mouth 3 (three) times daily as needed for cough. 30 capsule 1   Blood Glucose Calibration (TRUE METRIX LEVEL 1) Low SOLN USE TO CHECK METER 1 each 0   Blood Glucose Monitoring Suppl (TRUE METRIX AIR GLUCOSE METER) w/Device KIT USE AS INSTRUCTED TO CHECK BLOOD SUGAR ONCE A DAY 1 kit 0   butalbital-acetaminophen-caffeine (FIORICET) 50-325-40 MG tablet Take 1 tablet by mouth as needed for headache. TAKE 1 TABLET EVERY 6 HOURS AS NEEDED FOR HEADACHE 10 tablet 3   clopidogrel (PLAVIX) 75 MG tablet TAKE 1 TABLET (75 MG TOTAL) BY MOUTH DAILY. 90 tablet 3   co-enzyme Q-10 30 MG capsule Take 100 mg by mouth daily.      fluticasone (FLONASE) 50 MCG/ACT nasal spray USE 2 SPRAYS IN EACH NOSTRIL EVERY DAY 48 g 3   glucose blood (TRUE METRIX BLOOD GLUCOSE TEST) test strip TEST BLOOD SUGAR ONE TIME DAILY 100 strip 3   lactase (LACTAID) 3000 units tablet Take 2 tablets (6,000 Units total) by mouth 3 (three) times daily with meals. (Patient taking differently: Take 6,000 Units by mouth See admin instructions. Take 6,000 units (2 tablets) by mouth up to three times a day WITH DAIRY PRODUCTS)     lansoprazole (PREVACID) 30 MG capsule TAKE 1 CAPSULE (30 MG TOTAL) BY MOUTH DAILY AT 12 NOON. REPLACE PANTOPRAZOLE 14 capsule 0   Multiple Vitamin (MULTIVITAMIN) tablet Take 1 tablet by mouth at bedtime.     ondansetron (ZOFRAN) 4 MG tablet Take 1 tablet (4 mg total) by mouth every 8 (eight) hours as needed for nausea or vomiting. 20 tablet 0   topiramate (TOPAMAX) 25 MG tablet Take 1-2 tablets (25-50 mg total) by mouth See admin instructions. Take 25 mg by mouth in the morning and 50 mg at bedtime 180 tablet 1   TRUEplus Lancets 33G MISC TEST BLOOD SUGAR ONCE DAILY (NEED MD APPOINTMENT) 100 each 3   Ubrogepant (UBRELVY) 50 MG TABS Take 1 tab at onset of migraine.  May repeat in 2 hrs, if needed.  Max dose: 2 tabs/day. This is  a 30 day prescription. 12 tablet 11   vitamin B-12 (CYANOCOBALAMIN) 1000 MCG tablet Take 1 tablet (1,000 mcg total) by mouth every Monday, Wednesday, and Friday.     No current facility-administered medications on file prior to visit.    Allergies  Allergen Reactions   Amitriptyline Other (See Comments)    Worsened migraine and confusion   Lactose Intolerance (Gi) Nausea Only and Other (See Comments)    Abdominal pain, also   Oxybutynin Other (See Comments)    Urinary retention/hesitancy   Pantoprazole Other (See Comments)    Fatigue, weakness, diarrhea, abd pain and trouble swallowing. Tolerates omeprazole well   Tramadol Other (See Comments)    Causes confusion and dizziness   Latex Rash    Niacin And Related Itching   Septra [Sulfamethoxazole-Trimethoprim] Itching    Past Medical History:  Diagnosis Date   Allergy    Anemia    Arthritis    Cataract    Controlled type 2 diabetes mellitus without complication, without long-term current use of insulin (HCC)    COVID 04/27/2022   Diverticulitis    History of chicken pox    Hyperlipidemia    Kidney stone    Lactose intolerance in adult    Migraine    Stroke [facility])    Urge incontinence 04/29/2014    Past Surgical History:  Procedure Laterality Date   ABDOMINAL HYSTERECTOMY  2006   cervix remains, menorrhagia   APPENDECTOMY  1970   BREAST SURGERY  1995   biopsy-left   BREAST SURGERY  1995   right milk duct removed   CARPAL TUNNEL RELEASE Right 02/2017   CHOLECYSTECTOMY  1971   COLONOSCOPY  11/2005   diverticulosis o/w WNL [facility])   DEXA  2015   spine -0.4, hip -1.0 WNL   LITHOTRIPSY     LUMBAR EPIDURAL INJECTION Left 10/2017   transforaminal L L3/4 (Dr Ollen Bowl)   TUBAL LIGATION      Family History  Problem Relation Age of Onset   Heart disease Mother 67   Hyperlipidemia Mother    Hyperlipidemia Father    Stroke Father    Heart disease Father    Diabetes Father    Fibromyalgia Sister    Hyperlipidemia Sister    Heart disease Brother    Diabetes Brother     Social History   Socioeconomic History   Marital status: Married    Spouse name: [name]   Number of children: 1   Years of education: 12   Highest education level: Not on file  Occupational History   Occupation: Retired    Comment: Retired  Tobacco Use   Smoking status: Never   Smokeless tobacco: Never  Vaping Use   Vaping status: Never Used  Substance and Sexual Activity   Alcohol use: No    Alcohol/week: 0.0 standard drinks of alcohol   Drug use: No   Sexual activity: Not Currently  Other Topics Concern   Not on file  Social History Narrative   Patient is married and lives at home with her husband [name]). Patient is retired.     Edu: high school education.   Right handed.   Exercise 3 times a week.   Diet: good water, fruits/vegetables daily   Caffeine: None      HAs living will, HCPOA, full code   Social Determinants of Health   Financial Resource Strain: Low Risk  (10/17/2022)   Overall Financial Resource Strain (CARDIA)    Difficulty of Paying Living Expenses:  Not hard at all  Food Insecurity: No Food Insecurity (10/17/2022)   Hunger Vital Sign    Worried About Running Out of Food in the Last Year: Never true    Ran Out of Food in the Last Year: Never true  Transportation Needs: No Transportation Needs (10/17/2022)   PRAPARE - Administrator, Civil Service (Medical): No    Lack of Transportation (Non-Medical): No  Physical Activity: Insufficiently Active (10/17/2022)   Exercise Vital Sign    Days of Exercise per Week: 3 days    Minutes of Exercise per Session: 10 min  Stress: No Stress Concern Present (10/17/2022)   [employer] of Occupational Health - Occupational Stress Questionnaire    Feeling of Stress : Not at all  Social Connections: Socially Integrated (10/17/2022)   Social Connection and Isolation Panel [NHANES]    Frequency of Communication with Friends and Family: More than three times a week    Frequency of Social Gatherings with Friends and Family: More than three times a week    Attends Religious Services: More than 4 times per year    Active Member of [employer] or Organizations: Yes    Attends Engineer, structural: More than 4 times per year    Marital Status: Married  Catering manager Violence: Not At Risk (10/17/2022)   Humiliation, Afraid, Rape, and Kick questionnaire    Fear of Current or Ex-Partner: No    Emotionally Abused: No    Physically Abused: No    Sexually Abused: No   Review of Systems No chest pain No SOB    Objective:   Physical Exam Constitutional:      Appearance: Normal appearance.  Cardiovascular:     Rate and Rhythm: Normal rate and  regular rhythm.     Pulses: Normal pulses.     Heart sounds: No murmur heard.    No gallop.  Pulmonary:     Effort: Pulmonary effort is normal.     Breath sounds: Normal breath sounds. No wheezing or rales.  Musculoskeletal:     Comments: 1+ edema on right--trace on left No calf swelling or pain left or right  Neurological:     Mental Status: She is alert.            Assessment & Plan:

## 2022-12-14 DIAGNOSIS — Z1272 Encounter for screening for malignant neoplasm of vagina: Secondary | ICD-10-CM | POA: Diagnosis not present

## 2022-12-14 DIAGNOSIS — R87620 Atypical squamous cells of undetermined significance on cytologic smear of vagina (ASC-US): Secondary | ICD-10-CM | POA: Diagnosis not present

## 2022-12-14 DIAGNOSIS — Z01419 Encounter for gynecological examination (general) (routine) without abnormal findings: Secondary | ICD-10-CM | POA: Diagnosis not present

## 2022-12-20 ENCOUNTER — Other Ambulatory Visit: Payer: Self-pay | Admitting: Family Medicine

## 2022-12-20 ENCOUNTER — Other Ambulatory Visit: Payer: Self-pay

## 2022-12-20 DIAGNOSIS — G43109 Migraine with aura, not intractable, without status migrainosus: Secondary | ICD-10-CM

## 2022-12-20 MED ORDER — TOPIRAMATE 25 MG PO TABS: 25.0000 mg | ORAL_TABLET | ORAL | 1 refills | Status: DC

## 2022-12-20 MED ORDER — TOPIRAMATE 25 MG PO TABS: ORAL_TABLET | ORAL | 1 refills | Status: DC

## 2022-12-20 NOTE — Telephone Encounter (Signed)
Prescription Request  12/20/2022  LOV: 10/18/2022  What is the name of the medication or equipment?  topiramate (TOPAMAX) 25 MG tablet  Have you contacted your pharmacy to request a refill? Yes   Which pharmacy would you like this sent to?   [id number] - [place], [place] - [address] AT [facility] [address] [place] [address] Phone: [phone] Fax: [phone]    Patient notified that their request is being sent to the clinical staff for review and that they should receive a response within 2 business days.   Please advise at Mobile [phone] (mobile)  Medication is prescribed by patient's neurologist, patient was told by their office that they will not refill this until her appointment in October, and advised her to reach out to pcp. Advised patient I would send refill request to Dr. Reece Agar but could not guarantee he would refill

## 2022-12-20 NOTE — Telephone Encounter (Signed)
Med is prescribed by Dr. Terrace Arabia at [facility].  Spoke with pt relaying info above. Pt says she called them first and was told she couldn't get a refill until she had f/u appt, so appt scheduled for 02/27/23. However, is out of med. Nurse recommended pt contact PCP for refill until then. Plz advise.

## 2022-12-20 NOTE — Telephone Encounter (Signed)
Plz notify I've sent this in for patient to mail order. Does she need short supply sent to local pharmacy? If so, may do.

## 2022-12-21 ENCOUNTER — Other Ambulatory Visit: Payer: Self-pay | Admitting: Family Medicine

## 2022-12-21 DIAGNOSIS — G43109 Migraine with aura, not intractable, without status migrainosus: Secondary | ICD-10-CM

## 2022-12-21 MED ORDER — TOPIRAMATE 25 MG PO TABS
ORAL_TABLET | ORAL | 0 refills | Status: DC
Start: 2022-12-21 — End: 2023-01-02

## 2022-12-21 NOTE — Addendum Note (Signed)
Addended by: Nanci Pina on: 12/21/2022 11:10 AM   Modules accepted: Orders

## 2022-12-21 NOTE — Telephone Encounter (Addendum)
Pt rtn call. I relayed Dr. Timoteo Expose message. Pt expresses her thanks and does need rx for short supply sent to [facility].   Sent new 30-day rx, per Dr. Reece Agar.

## 2022-12-21 NOTE — Telephone Encounter (Signed)
Lvm asking pt to call back. Need to notify her Dr. Reece Agar sent a refill to [facility] mail order. But does she also need a short supply sent to [facility]?

## 2022-12-21 NOTE — Telephone Encounter (Signed)
Spoke with [facility] notifying them Dr. Reece Agar is only allowing a 30-day rx with them to hold pt until she receives shipment from mail order pharmacy. Verbalizes understanding and will fill 30-day supply.

## 2022-12-22 ENCOUNTER — Encounter (INDEPENDENT_AMBULATORY_CARE_PROVIDER_SITE_OTHER): Payer: Self-pay

## 2022-12-26 DIAGNOSIS — J984 Other disorders of lung: Secondary | ICD-10-CM | POA: Diagnosis not present

## 2022-12-26 DIAGNOSIS — J90 Pleural effusion, not elsewhere classified: Secondary | ICD-10-CM | POA: Diagnosis not present

## 2022-12-26 DIAGNOSIS — I2510 Atherosclerotic heart disease of native coronary artery without angina pectoris: Secondary | ICD-10-CM | POA: Diagnosis not present

## 2022-12-26 DIAGNOSIS — R0602 Shortness of breath: Secondary | ICD-10-CM | POA: Diagnosis not present

## 2022-12-26 DIAGNOSIS — L270 Generalized skin eruption due to drugs and medicaments taken internally: Secondary | ICD-10-CM | POA: Diagnosis not present

## 2022-12-26 DIAGNOSIS — R6520 Severe sepsis without septic shock: Secondary | ICD-10-CM | POA: Diagnosis not present

## 2022-12-26 DIAGNOSIS — R918 Other nonspecific abnormal finding of lung field: Secondary | ICD-10-CM | POA: Diagnosis not present

## 2022-12-26 DIAGNOSIS — M7989 Other specified soft tissue disorders: Secondary | ICD-10-CM | POA: Diagnosis not present

## 2022-12-26 DIAGNOSIS — C801 Malignant (primary) neoplasm, unspecified: Secondary | ICD-10-CM | POA: Diagnosis not present

## 2022-12-26 DIAGNOSIS — M79604 Pain in right leg: Secondary | ICD-10-CM | POA: Diagnosis not present

## 2022-12-26 DIAGNOSIS — I517 Cardiomegaly: Secondary | ICD-10-CM | POA: Diagnosis not present

## 2022-12-26 DIAGNOSIS — R509 Fever, unspecified: Secondary | ICD-10-CM | POA: Diagnosis not present

## 2022-12-26 DIAGNOSIS — Z4901 Encounter for fitting and adjustment of extracorporeal dialysis catheter: Secondary | ICD-10-CM | POA: Diagnosis not present

## 2022-12-26 DIAGNOSIS — K5730 Diverticulosis of large intestine without perforation or abscess without bleeding: Secondary | ICD-10-CM | POA: Diagnosis not present

## 2022-12-26 DIAGNOSIS — D6869 Other thrombophilia: Secondary | ICD-10-CM | POA: Diagnosis not present

## 2022-12-26 DIAGNOSIS — T451X5A Adverse effect of antineoplastic and immunosuppressive drugs, initial encounter: Secondary | ICD-10-CM | POA: Diagnosis not present

## 2022-12-26 DIAGNOSIS — R7989 Other specified abnormal findings of blood chemistry: Secondary | ICD-10-CM | POA: Diagnosis not present

## 2022-12-26 DIAGNOSIS — D638 Anemia in other chronic diseases classified elsewhere: Secondary | ICD-10-CM | POA: Diagnosis not present

## 2022-12-26 DIAGNOSIS — D84821 Immunodeficiency due to drugs: Secondary | ICD-10-CM | POA: Diagnosis not present

## 2022-12-26 DIAGNOSIS — J849 Interstitial pulmonary disease, unspecified: Secondary | ICD-10-CM | POA: Diagnosis not present

## 2022-12-26 DIAGNOSIS — I708 Atherosclerosis of other arteries: Secondary | ICD-10-CM | POA: Diagnosis not present

## 2022-12-26 DIAGNOSIS — C9200 Acute myeloblastic leukemia, not having achieved remission: Secondary | ICD-10-CM | POA: Diagnosis not present

## 2022-12-26 DIAGNOSIS — Z452 Encounter for adjustment and management of vascular access device: Secondary | ICD-10-CM | POA: Diagnosis not present

## 2022-12-26 DIAGNOSIS — A419 Sepsis, unspecified organism: Secondary | ICD-10-CM | POA: Diagnosis not present

## 2022-12-26 DIAGNOSIS — J8410 Pulmonary fibrosis, unspecified: Secondary | ICD-10-CM | POA: Diagnosis not present

## 2022-12-26 DIAGNOSIS — D696 Thrombocytopenia, unspecified: Secondary | ICD-10-CM | POA: Diagnosis not present

## 2022-12-26 DIAGNOSIS — R579 Shock, unspecified: Secondary | ICD-10-CM | POA: Diagnosis not present

## 2022-12-26 DIAGNOSIS — D8481 Immunodeficiency due to conditions classified elsewhere: Secondary | ICD-10-CM | POA: Diagnosis not present

## 2022-12-26 DIAGNOSIS — R748 Abnormal levels of other serum enzymes: Secondary | ICD-10-CM | POA: Diagnosis not present

## 2022-12-26 DIAGNOSIS — D7389 Other diseases of spleen: Secondary | ICD-10-CM | POA: Diagnosis not present

## 2022-12-26 DIAGNOSIS — D72820 Lymphocytosis (symptomatic): Secondary | ICD-10-CM | POA: Diagnosis not present

## 2022-12-26 DIAGNOSIS — K219 Gastro-esophageal reflux disease without esophagitis: Secondary | ICD-10-CM | POA: Diagnosis not present

## 2022-12-26 DIAGNOSIS — N19 Unspecified kidney failure: Secondary | ICD-10-CM | POA: Diagnosis not present

## 2022-12-26 DIAGNOSIS — E1169 Type 2 diabetes mellitus with other specified complication: Secondary | ICD-10-CM | POA: Diagnosis not present

## 2022-12-26 DIAGNOSIS — Z95828 Presence of other vascular implants and grafts: Secondary | ICD-10-CM | POA: Diagnosis not present

## 2022-12-26 DIAGNOSIS — Z0189 Encounter for other specified special examinations: Secondary | ICD-10-CM | POA: Diagnosis not present

## 2022-12-26 DIAGNOSIS — R531 Weakness: Secondary | ICD-10-CM | POA: Diagnosis not present

## 2022-12-26 DIAGNOSIS — C9590 Leukemia, unspecified not having achieved remission: Secondary | ICD-10-CM | POA: Diagnosis not present

## 2022-12-26 DIAGNOSIS — R1012 Left upper quadrant pain: Secondary | ICD-10-CM | POA: Diagnosis not present

## 2022-12-26 DIAGNOSIS — Z8673 Personal history of transient ischemic attack (TIA), and cerebral infarction without residual deficits: Secondary | ICD-10-CM | POA: Diagnosis not present

## 2022-12-26 DIAGNOSIS — R059 Cough, unspecified: Secondary | ICD-10-CM | POA: Diagnosis not present

## 2022-12-26 DIAGNOSIS — Z9189 Other specified personal risk factors, not elsewhere classified: Secondary | ICD-10-CM | POA: Diagnosis not present

## 2022-12-26 DIAGNOSIS — D72829 Elevated white blood cell count, unspecified: Secondary | ICD-10-CM | POA: Diagnosis not present

## 2022-12-26 DIAGNOSIS — K838 Other specified diseases of biliary tract: Secondary | ICD-10-CM | POA: Diagnosis not present

## 2022-12-26 DIAGNOSIS — E119 Type 2 diabetes mellitus without complications: Secondary | ICD-10-CM | POA: Diagnosis not present

## 2022-12-26 DIAGNOSIS — S0031XA Abrasion of nose, initial encounter: Secondary | ICD-10-CM | POA: Diagnosis not present

## 2022-12-26 DIAGNOSIS — D61810 Antineoplastic chemotherapy induced pancytopenia: Secondary | ICD-10-CM | POA: Diagnosis not present

## 2022-12-26 DIAGNOSIS — R040 Epistaxis: Secondary | ICD-10-CM | POA: Diagnosis not present

## 2022-12-26 DIAGNOSIS — D630 Anemia in neoplastic disease: Secondary | ICD-10-CM | POA: Diagnosis not present

## 2022-12-26 DIAGNOSIS — K5900 Constipation, unspecified: Secondary | ICD-10-CM | POA: Diagnosis not present

## 2022-12-26 DIAGNOSIS — J9601 Acute respiratory failure with hypoxia: Secondary | ICD-10-CM | POA: Diagnosis not present

## 2022-12-26 DIAGNOSIS — C9500 Acute leukemia of unspecified cell type not having achieved remission: Secondary | ICD-10-CM | POA: Diagnosis not present

## 2022-12-26 DIAGNOSIS — K7689 Other specified diseases of liver: Secondary | ICD-10-CM | POA: Diagnosis not present

## 2022-12-26 DIAGNOSIS — J9811 Atelectasis: Secondary | ICD-10-CM | POA: Diagnosis not present

## 2022-12-26 DIAGNOSIS — E872 Acidosis, unspecified: Secondary | ICD-10-CM | POA: Diagnosis not present

## 2022-12-26 DIAGNOSIS — D61818 Other pancytopenia: Secondary | ICD-10-CM | POA: Diagnosis not present

## 2022-12-26 DIAGNOSIS — K769 Liver disease, unspecified: Secondary | ICD-10-CM | POA: Diagnosis not present

## 2022-12-27 DIAGNOSIS — Z4901 Encounter for fitting and adjustment of extracorporeal dialysis catheter: Secondary | ICD-10-CM | POA: Diagnosis not present

## 2022-12-27 DIAGNOSIS — N19 Unspecified kidney failure: Secondary | ICD-10-CM | POA: Diagnosis not present

## 2022-12-27 DIAGNOSIS — D696 Thrombocytopenia, unspecified: Secondary | ICD-10-CM | POA: Diagnosis not present

## 2022-12-27 DIAGNOSIS — Z452 Encounter for adjustment and management of vascular access device: Secondary | ICD-10-CM | POA: Diagnosis not present

## 2022-12-27 DIAGNOSIS — D72820 Lymphocytosis (symptomatic): Secondary | ICD-10-CM | POA: Diagnosis not present

## 2022-12-28 DIAGNOSIS — R509 Fever, unspecified: Secondary | ICD-10-CM | POA: Diagnosis not present

## 2022-12-28 DIAGNOSIS — R918 Other nonspecific abnormal finding of lung field: Secondary | ICD-10-CM | POA: Diagnosis not present

## 2022-12-28 DIAGNOSIS — J984 Other disorders of lung: Secondary | ICD-10-CM | POA: Diagnosis not present

## 2022-12-29 DIAGNOSIS — J9811 Atelectasis: Secondary | ICD-10-CM | POA: Diagnosis not present

## 2022-12-29 DIAGNOSIS — R509 Fever, unspecified: Secondary | ICD-10-CM | POA: Diagnosis not present

## 2022-12-29 DIAGNOSIS — Z95828 Presence of other vascular implants and grafts: Secondary | ICD-10-CM | POA: Diagnosis not present

## 2022-12-30 DIAGNOSIS — Z95828 Presence of other vascular implants and grafts: Secondary | ICD-10-CM | POA: Diagnosis not present

## 2022-12-30 DIAGNOSIS — R0602 Shortness of breath: Secondary | ICD-10-CM | POA: Diagnosis not present

## 2022-12-30 DIAGNOSIS — R918 Other nonspecific abnormal finding of lung field: Secondary | ICD-10-CM | POA: Diagnosis not present

## 2022-12-30 DIAGNOSIS — R509 Fever, unspecified: Secondary | ICD-10-CM | POA: Diagnosis not present

## 2022-12-31 DIAGNOSIS — Z95828 Presence of other vascular implants and grafts: Secondary | ICD-10-CM | POA: Diagnosis not present

## 2022-12-31 DIAGNOSIS — R0602 Shortness of breath: Secondary | ICD-10-CM | POA: Diagnosis not present

## 2022-12-31 DIAGNOSIS — R918 Other nonspecific abnormal finding of lung field: Secondary | ICD-10-CM | POA: Diagnosis not present

## 2023-01-01 DIAGNOSIS — J90 Pleural effusion, not elsewhere classified: Secondary | ICD-10-CM | POA: Diagnosis not present

## 2023-01-01 DIAGNOSIS — R059 Cough, unspecified: Secondary | ICD-10-CM | POA: Diagnosis not present

## 2023-01-01 DIAGNOSIS — R918 Other nonspecific abnormal finding of lung field: Secondary | ICD-10-CM | POA: Diagnosis not present

## 2023-01-02 ENCOUNTER — Telehealth: Payer: Self-pay | Admitting: Family Medicine

## 2023-01-02 DIAGNOSIS — G43109 Migraine with aura, not intractable, without status migrainosus: Secondary | ICD-10-CM

## 2023-01-02 NOTE — Telephone Encounter (Signed)
Dana [name] from [facility] called in and stated that they received two prescriptions for topiramate (TOPAMAX) 25 MG tablet. She stated that they are needing clarification on the instructions. She stated that updated prescriptions can be sent over to fax number [phone]. Thank you!

## 2023-01-02 NOTE — Telephone Encounter (Signed)
[facility] received paper refill request on this patient in regards to the topirimate one from [facility] mail order its been over a year since she has been seen here for pcp to advise

## 2023-01-03 ENCOUNTER — Telehealth: Payer: Self-pay | Admitting: Family Medicine

## 2023-01-03 DIAGNOSIS — J90 Pleural effusion, not elsewhere classified: Secondary | ICD-10-CM | POA: Diagnosis not present

## 2023-01-03 MED ORDER — TOPIRAMATE 25 MG PO TABS
ORAL_TABLET | ORAL | 1 refills | Status: DC
Start: 2023-01-03 — End: 2023-05-25

## 2023-01-03 NOTE — Telephone Encounter (Signed)
ERx 90d supply. Next neurology appt is 02/2023.

## 2023-01-03 NOTE — Telephone Encounter (Signed)
Spoke with husband Everlean Alstrom to get update on pt who is currently hospitalized at [facility] with AML. She just transitioned from ICU to oncology floor, PICC lines removed.

## 2023-01-06 ENCOUNTER — Other Ambulatory Visit: Payer: Self-pay | Admitting: *Deleted

## 2023-01-06 DIAGNOSIS — M79661 Pain in right lower leg: Secondary | ICD-10-CM

## 2023-01-17 ENCOUNTER — Encounter: Payer: Medicare HMO | Admitting: Vascular Surgery

## 2023-01-17 ENCOUNTER — Ambulatory Visit (HOSPITAL_COMMUNITY): Payer: Medicare HMO

## 2023-01-25 ENCOUNTER — Other Ambulatory Visit: Payer: Medicare HMO

## 2023-02-01 ENCOUNTER — Telehealth: Payer: Self-pay | Admitting: Family Medicine

## 2023-02-01 ENCOUNTER — Encounter: Payer: Medicare HMO | Admitting: Family Medicine

## 2023-02-01 DIAGNOSIS — C9200 Acute myeloblastic leukemia, not having achieved remission: Secondary | ICD-10-CM | POA: Diagnosis not present

## 2023-02-01 NOTE — Telephone Encounter (Signed)
Doug from [facility] called in and stated that they received a referral from [facility] for [facility]. He was wanting to know if Dr/ G would follow. Please advise. Thank you!

## 2023-02-01 NOTE — Telephone Encounter (Signed)
Yes I will follow for [facility] needs.  Please schedule hospital f/u visit.  Thanks.

## 2023-02-01 NOTE — Telephone Encounter (Signed)
Rtn Doug's call informing Dr Reece Agar will follow for [facility] needs. States he will document in pt's chart and expresses his thanks for the call back.   Spoke with pt to schedule hosp f/u. The earliest she is available is 2nd wk of Oct. Pt will have to call back to schedule due to being in car and not having her calendar.

## 2023-02-02 DIAGNOSIS — D8481 Immunodeficiency due to conditions classified elsewhere: Secondary | ICD-10-CM | POA: Diagnosis not present

## 2023-02-02 DIAGNOSIS — D84821 Immunodeficiency due to drugs: Secondary | ICD-10-CM | POA: Diagnosis not present

## 2023-02-02 DIAGNOSIS — R110 Nausea: Secondary | ICD-10-CM | POA: Diagnosis not present

## 2023-02-02 DIAGNOSIS — D649 Anemia, unspecified: Secondary | ICD-10-CM | POA: Diagnosis not present

## 2023-02-02 DIAGNOSIS — T451X5D Adverse effect of antineoplastic and immunosuppressive drugs, subsequent encounter: Secondary | ICD-10-CM | POA: Diagnosis not present

## 2023-02-02 DIAGNOSIS — D72829 Elevated white blood cell count, unspecified: Secondary | ICD-10-CM | POA: Diagnosis not present

## 2023-02-02 DIAGNOSIS — D709 Neutropenia, unspecified: Secondary | ICD-10-CM | POA: Diagnosis not present

## 2023-02-02 DIAGNOSIS — E785 Hyperlipidemia, unspecified: Secondary | ICD-10-CM | POA: Diagnosis not present

## 2023-02-02 DIAGNOSIS — E1169 Type 2 diabetes mellitus with other specified complication: Secondary | ICD-10-CM | POA: Diagnosis not present

## 2023-02-06 ENCOUNTER — Telehealth: Payer: Self-pay | Admitting: Family Medicine

## 2023-02-06 DIAGNOSIS — E785 Hyperlipidemia, unspecified: Secondary | ICD-10-CM | POA: Diagnosis not present

## 2023-02-06 DIAGNOSIS — R110 Nausea: Secondary | ICD-10-CM | POA: Diagnosis not present

## 2023-02-06 DIAGNOSIS — E1169 Type 2 diabetes mellitus with other specified complication: Secondary | ICD-10-CM | POA: Diagnosis not present

## 2023-02-06 DIAGNOSIS — D84821 Immunodeficiency due to drugs: Secondary | ICD-10-CM | POA: Diagnosis not present

## 2023-02-06 DIAGNOSIS — C9200 Acute myeloblastic leukemia, not having achieved remission: Secondary | ICD-10-CM | POA: Diagnosis not present

## 2023-02-06 DIAGNOSIS — T451X5D Adverse effect of antineoplastic and immunosuppressive drugs, subsequent encounter: Secondary | ICD-10-CM | POA: Diagnosis not present

## 2023-02-06 DIAGNOSIS — D8481 Immunodeficiency due to conditions classified elsewhere: Secondary | ICD-10-CM | POA: Diagnosis not present

## 2023-02-06 DIAGNOSIS — D709 Neutropenia, unspecified: Secondary | ICD-10-CM | POA: Diagnosis not present

## 2023-02-06 DIAGNOSIS — Z5111 Encounter for antineoplastic chemotherapy: Secondary | ICD-10-CM | POA: Diagnosis not present

## 2023-02-06 DIAGNOSIS — D72829 Elevated white blood cell count, unspecified: Secondary | ICD-10-CM | POA: Diagnosis not present

## 2023-02-06 DIAGNOSIS — D649 Anemia, unspecified: Secondary | ICD-10-CM | POA: Diagnosis not present

## 2023-02-06 NOTE — Telephone Encounter (Signed)
Home Health verbal orders Caller Name:Raj Agency Name: [facility] number: [phone]  Requesting OT/PT/Skilled nursing/Social Work/Speech:OT  Reason:weakness,lost of balance after hospital stay for lukemia   Frequency:1X WK  8 WKS  Please forward to [facility] pool or providers CMA

## 2023-02-07 DIAGNOSIS — C9200 Acute myeloblastic leukemia, not having achieved remission: Secondary | ICD-10-CM | POA: Diagnosis not present

## 2023-02-07 DIAGNOSIS — Z5111 Encounter for antineoplastic chemotherapy: Secondary | ICD-10-CM | POA: Diagnosis not present

## 2023-02-07 NOTE — Telephone Encounter (Signed)
Agree with this.  She saw oncologist on 02/01/2023.

## 2023-02-07 NOTE — Telephone Encounter (Signed)
Spoke with Dana [name] and gave OK verbal orders for OT 1x week 8weeks.

## 2023-02-08 DIAGNOSIS — C9200 Acute myeloblastic leukemia, not having achieved remission: Secondary | ICD-10-CM | POA: Diagnosis not present

## 2023-02-08 DIAGNOSIS — Z5111 Encounter for antineoplastic chemotherapy: Secondary | ICD-10-CM | POA: Diagnosis not present

## 2023-02-09 DIAGNOSIS — D72829 Elevated white blood cell count, unspecified: Secondary | ICD-10-CM | POA: Diagnosis not present

## 2023-02-09 DIAGNOSIS — C9200 Acute myeloblastic leukemia, not having achieved remission: Secondary | ICD-10-CM | POA: Diagnosis not present

## 2023-02-10 DIAGNOSIS — C9200 Acute myeloblastic leukemia, not having achieved remission: Secondary | ICD-10-CM | POA: Diagnosis not present

## 2023-02-10 DIAGNOSIS — Z5111 Encounter for antineoplastic chemotherapy: Secondary | ICD-10-CM | POA: Diagnosis not present

## 2023-02-14 DIAGNOSIS — D72829 Elevated white blood cell count, unspecified: Secondary | ICD-10-CM | POA: Diagnosis not present

## 2023-02-14 DIAGNOSIS — E785 Hyperlipidemia, unspecified: Secondary | ICD-10-CM | POA: Diagnosis not present

## 2023-02-14 DIAGNOSIS — R110 Nausea: Secondary | ICD-10-CM | POA: Diagnosis not present

## 2023-02-14 DIAGNOSIS — D649 Anemia, unspecified: Secondary | ICD-10-CM | POA: Diagnosis not present

## 2023-02-14 DIAGNOSIS — D84821 Immunodeficiency due to drugs: Secondary | ICD-10-CM | POA: Diagnosis not present

## 2023-02-14 DIAGNOSIS — E1169 Type 2 diabetes mellitus with other specified complication: Secondary | ICD-10-CM | POA: Diagnosis not present

## 2023-02-14 DIAGNOSIS — D709 Neutropenia, unspecified: Secondary | ICD-10-CM | POA: Diagnosis not present

## 2023-02-14 DIAGNOSIS — T451X5D Adverse effect of antineoplastic and immunosuppressive drugs, subsequent encounter: Secondary | ICD-10-CM | POA: Diagnosis not present

## 2023-02-14 DIAGNOSIS — D8481 Immunodeficiency due to conditions classified elsewhere: Secondary | ICD-10-CM | POA: Diagnosis not present

## 2023-02-16 ENCOUNTER — Other Ambulatory Visit: Payer: Self-pay | Admitting: Family Medicine

## 2023-02-16 DIAGNOSIS — E785 Hyperlipidemia, unspecified: Secondary | ICD-10-CM | POA: Diagnosis not present

## 2023-02-16 DIAGNOSIS — D709 Neutropenia, unspecified: Secondary | ICD-10-CM | POA: Diagnosis not present

## 2023-02-16 DIAGNOSIS — D8481 Immunodeficiency due to conditions classified elsewhere: Secondary | ICD-10-CM | POA: Diagnosis not present

## 2023-02-16 DIAGNOSIS — D72829 Elevated white blood cell count, unspecified: Secondary | ICD-10-CM | POA: Diagnosis not present

## 2023-02-16 DIAGNOSIS — D84821 Immunodeficiency due to drugs: Secondary | ICD-10-CM | POA: Diagnosis not present

## 2023-02-16 DIAGNOSIS — T451X5D Adverse effect of antineoplastic and immunosuppressive drugs, subsequent encounter: Secondary | ICD-10-CM | POA: Diagnosis not present

## 2023-02-16 DIAGNOSIS — D649 Anemia, unspecified: Secondary | ICD-10-CM | POA: Diagnosis not present

## 2023-02-16 DIAGNOSIS — E1169 Type 2 diabetes mellitus with other specified complication: Secondary | ICD-10-CM | POA: Diagnosis not present

## 2023-02-16 DIAGNOSIS — R110 Nausea: Secondary | ICD-10-CM | POA: Diagnosis not present

## 2023-02-17 DIAGNOSIS — Z91040 Latex allergy status: Secondary | ICD-10-CM | POA: Diagnosis not present

## 2023-02-17 DIAGNOSIS — E1169 Type 2 diabetes mellitus with other specified complication: Secondary | ICD-10-CM | POA: Diagnosis not present

## 2023-02-17 DIAGNOSIS — Z79899 Other long term (current) drug therapy: Secondary | ICD-10-CM | POA: Diagnosis not present

## 2023-02-17 DIAGNOSIS — Z452 Encounter for adjustment and management of vascular access device: Secondary | ICD-10-CM | POA: Diagnosis not present

## 2023-02-17 DIAGNOSIS — K219 Gastro-esophageal reflux disease without esophagitis: Secondary | ICD-10-CM | POA: Diagnosis not present

## 2023-02-17 DIAGNOSIS — Z8673 Personal history of transient ischemic attack (TIA), and cerebral infarction without residual deficits: Secondary | ICD-10-CM | POA: Diagnosis not present

## 2023-02-17 DIAGNOSIS — Z882 Allergy status to sulfonamides status: Secondary | ICD-10-CM | POA: Diagnosis not present

## 2023-02-17 DIAGNOSIS — C9200 Acute myeloblastic leukemia, not having achieved remission: Secondary | ICD-10-CM | POA: Diagnosis not present

## 2023-02-17 DIAGNOSIS — E785 Hyperlipidemia, unspecified: Secondary | ICD-10-CM | POA: Diagnosis not present

## 2023-02-17 NOTE — Telephone Encounter (Signed)
ERx 

## 2023-02-20 DIAGNOSIS — R110 Nausea: Secondary | ICD-10-CM | POA: Diagnosis not present

## 2023-02-20 DIAGNOSIS — E1169 Type 2 diabetes mellitus with other specified complication: Secondary | ICD-10-CM | POA: Diagnosis not present

## 2023-02-20 DIAGNOSIS — D649 Anemia, unspecified: Secondary | ICD-10-CM | POA: Diagnosis not present

## 2023-02-20 DIAGNOSIS — D8481 Immunodeficiency due to conditions classified elsewhere: Secondary | ICD-10-CM | POA: Diagnosis not present

## 2023-02-20 DIAGNOSIS — E785 Hyperlipidemia, unspecified: Secondary | ICD-10-CM | POA: Diagnosis not present

## 2023-02-20 DIAGNOSIS — D84821 Immunodeficiency due to drugs: Secondary | ICD-10-CM | POA: Diagnosis not present

## 2023-02-20 DIAGNOSIS — T451X5D Adverse effect of antineoplastic and immunosuppressive drugs, subsequent encounter: Secondary | ICD-10-CM | POA: Diagnosis not present

## 2023-02-20 DIAGNOSIS — D709 Neutropenia, unspecified: Secondary | ICD-10-CM | POA: Diagnosis not present

## 2023-02-20 DIAGNOSIS — D696 Thrombocytopenia, unspecified: Secondary | ICD-10-CM | POA: Diagnosis not present

## 2023-02-20 DIAGNOSIS — M5416 Radiculopathy, lumbar region: Secondary | ICD-10-CM

## 2023-02-20 DIAGNOSIS — M549 Dorsalgia, unspecified: Secondary | ICD-10-CM

## 2023-02-22 ENCOUNTER — Other Ambulatory Visit: Payer: Self-pay | Admitting: Family Medicine

## 2023-02-22 DIAGNOSIS — K219 Gastro-esophageal reflux disease without esophagitis: Secondary | ICD-10-CM

## 2023-02-22 DIAGNOSIS — R110 Nausea: Secondary | ICD-10-CM | POA: Diagnosis not present

## 2023-02-22 DIAGNOSIS — D649 Anemia, unspecified: Secondary | ICD-10-CM | POA: Diagnosis not present

## 2023-02-22 DIAGNOSIS — D84821 Immunodeficiency due to drugs: Secondary | ICD-10-CM | POA: Diagnosis not present

## 2023-02-22 DIAGNOSIS — E118 Type 2 diabetes mellitus with unspecified complications: Secondary | ICD-10-CM

## 2023-02-22 DIAGNOSIS — D8481 Immunodeficiency due to conditions classified elsewhere: Secondary | ICD-10-CM | POA: Diagnosis not present

## 2023-02-22 DIAGNOSIS — D709 Neutropenia, unspecified: Secondary | ICD-10-CM | POA: Diagnosis not present

## 2023-02-22 DIAGNOSIS — E785 Hyperlipidemia, unspecified: Secondary | ICD-10-CM | POA: Diagnosis not present

## 2023-02-22 DIAGNOSIS — T451X5D Adverse effect of antineoplastic and immunosuppressive drugs, subsequent encounter: Secondary | ICD-10-CM | POA: Diagnosis not present

## 2023-02-22 DIAGNOSIS — E1169 Type 2 diabetes mellitus with other specified complication: Secondary | ICD-10-CM | POA: Diagnosis not present

## 2023-02-22 DIAGNOSIS — D72829 Elevated white blood cell count, unspecified: Secondary | ICD-10-CM | POA: Diagnosis not present

## 2023-02-23 DIAGNOSIS — D709 Neutropenia, unspecified: Secondary | ICD-10-CM | POA: Diagnosis not present

## 2023-02-23 DIAGNOSIS — R110 Nausea: Secondary | ICD-10-CM | POA: Diagnosis not present

## 2023-02-23 DIAGNOSIS — D649 Anemia, unspecified: Secondary | ICD-10-CM | POA: Diagnosis not present

## 2023-02-23 DIAGNOSIS — D84821 Immunodeficiency due to drugs: Secondary | ICD-10-CM | POA: Diagnosis not present

## 2023-02-23 DIAGNOSIS — T451X5D Adverse effect of antineoplastic and immunosuppressive drugs, subsequent encounter: Secondary | ICD-10-CM | POA: Diagnosis not present

## 2023-02-23 DIAGNOSIS — E785 Hyperlipidemia, unspecified: Secondary | ICD-10-CM | POA: Diagnosis not present

## 2023-02-23 DIAGNOSIS — D8481 Immunodeficiency due to conditions classified elsewhere: Secondary | ICD-10-CM | POA: Diagnosis not present

## 2023-02-23 DIAGNOSIS — E1169 Type 2 diabetes mellitus with other specified complication: Secondary | ICD-10-CM | POA: Diagnosis not present

## 2023-02-23 DIAGNOSIS — D72829 Elevated white blood cell count, unspecified: Secondary | ICD-10-CM | POA: Diagnosis not present

## 2023-02-24 DIAGNOSIS — D72829 Elevated white blood cell count, unspecified: Secondary | ICD-10-CM | POA: Diagnosis not present

## 2023-02-24 DIAGNOSIS — D8481 Immunodeficiency due to conditions classified elsewhere: Secondary | ICD-10-CM | POA: Diagnosis not present

## 2023-02-24 DIAGNOSIS — D709 Neutropenia, unspecified: Secondary | ICD-10-CM | POA: Diagnosis not present

## 2023-02-24 DIAGNOSIS — E1169 Type 2 diabetes mellitus with other specified complication: Secondary | ICD-10-CM | POA: Diagnosis not present

## 2023-02-24 DIAGNOSIS — T451X5D Adverse effect of antineoplastic and immunosuppressive drugs, subsequent encounter: Secondary | ICD-10-CM | POA: Diagnosis not present

## 2023-02-24 DIAGNOSIS — E785 Hyperlipidemia, unspecified: Secondary | ICD-10-CM | POA: Diagnosis not present

## 2023-02-24 DIAGNOSIS — D649 Anemia, unspecified: Secondary | ICD-10-CM | POA: Diagnosis not present

## 2023-02-24 DIAGNOSIS — D84821 Immunodeficiency due to drugs: Secondary | ICD-10-CM | POA: Diagnosis not present

## 2023-02-24 DIAGNOSIS — R110 Nausea: Secondary | ICD-10-CM | POA: Diagnosis not present

## 2023-02-27 ENCOUNTER — Ambulatory Visit: Payer: Medicare HMO | Admitting: Neurology

## 2023-02-27 ENCOUNTER — Encounter: Payer: Self-pay | Admitting: Neurology

## 2023-03-01 ENCOUNTER — Ambulatory Visit (INDEPENDENT_AMBULATORY_CARE_PROVIDER_SITE_OTHER): Payer: Medicare HMO | Admitting: Family Medicine

## 2023-03-01 ENCOUNTER — Encounter: Payer: Self-pay | Admitting: Family Medicine

## 2023-03-01 VITALS — BP 110/66 | HR 80 | Temp 98.2°F | Ht 62.0 in

## 2023-03-01 DIAGNOSIS — E1169 Type 2 diabetes mellitus with other specified complication: Secondary | ICD-10-CM | POA: Diagnosis not present

## 2023-03-01 DIAGNOSIS — F4321 Adjustment disorder with depressed mood: Secondary | ICD-10-CM | POA: Diagnosis not present

## 2023-03-01 DIAGNOSIS — R630 Anorexia: Secondary | ICD-10-CM | POA: Diagnosis not present

## 2023-03-01 DIAGNOSIS — D84821 Immunodeficiency due to drugs: Secondary | ICD-10-CM

## 2023-03-01 DIAGNOSIS — Z79899 Other long term (current) drug therapy: Secondary | ICD-10-CM | POA: Diagnosis not present

## 2023-03-01 DIAGNOSIS — C9200 Acute myeloblastic leukemia, not having achieved remission: Secondary | ICD-10-CM

## 2023-03-01 MED ORDER — SERTRALINE HCL 25 MG PO TABS: 25.0000 mg | ORAL_TABLET | Freq: Every day | ORAL | 6 refills | Status: DC

## 2023-03-01 NOTE — Patient Instructions (Addendum)
Good to see you today Continue ensure shake 1 a day (ie 1/2 can twice daily).  Keep oncology appointments and treatment schedule  Return in 4-6 months for follow up visit

## 2023-03-01 NOTE — Progress Notes (Signed)
Ph: [phone] Fax: [phone]   Patient ID: Dana [name], female    DOB: 1945-06-05, 77 y.o.   MRN: [id number]  This visit was conducted in person.  BP 110/66   Pulse 80   Temp 98.2 F (36.8 C) (Oral)   Ht 5\' 2"  (1.575 m)   SpO2 98%   BMI 30.00 kg/m    CC: hosp f/u visit  Subjective:   HPI: Dana [name] is a 77 y.o. female presenting on 03/01/2023 for Hospitalization Follow-up (Admitted on 12/26/22 at [facility]. Pt states she's been dx with leukemia. Pt accompanied by husband, Everlean Alstrom. 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 Now sees Dr Imogene Burn oncology at [facility] in [place]. She is receiving chemotherapy (currently on azacitidine and venetoclax), just had port placed 02/17/2023 by Dr Colon Branch at [facility] in [place].   Leukemia presented with persistent epistaxis.  Next appt with oncology is Friday Oct 25th.  Med reconciliation performed.  She was told to stop plavix.  She was told to avoid fresh fruits/vegetables due to possible E coli contamination.   Notes intermittent diarrhea. She finds anti-nausea causes constipation.   No appetite, notes taste has changed (dysgeusia) nothing tastes good. She does tolerate ensure well.   [facility] established care - PT, OT, nurse aide.  Missed appt Monday with neurology office (Dr Terrace Arabia) - due to bathroom emergency on the drive there.      Relevant past medical, surgical, family and social history reviewed and updated as indicated. Interim medical history since our last visit reviewed. Allergies and medications reviewed and updated. Outpatient Medications Prior to Visit  Medication Sig Dispense Refill   acetaminophen (TYLENOL) 325 MG tablet Take 1 tablet (325 mg total) by mouth every 6 (six) hours as needed for mild pain.     Alcohol Swabs (DROPSAFE ALCOHOL PREP) 70 % PADS USE TO TEST BLOOD SUGAR ONCE DAILY (NEED MD APPOINTMENT) 100 each 3   atorvastatin (LIPITOR)  40 MG tablet TAKE 1 TABLET AT BEDTIME 90 tablet 1   benzonatate (TESSALON) 200 MG capsule Take 1 capsule (200 mg total) by mouth 3 (three) times daily as needed for cough. 30 capsule 1   Blood Glucose Calibration (TRUE METRIX LEVEL 1) Low SOLN USE TO CHECK METER 1 each 0   Blood Glucose Monitoring Suppl (TRUE METRIX AIR GLUCOSE METER) w/Device KIT USE AS INSTRUCTED TO CHECK BLOOD SUGAR ONCE A DAY 1 kit 0   butalbital-acetaminophen-caffeine (FIORICET) 50-325-40 MG tablet Take 1 tablet by mouth as needed for headache. TAKE 1 TABLET EVERY 6 HOURS AS NEEDED FOR HEADACHE 10 tablet 3   clopidogrel (PLAVIX) 75 MG tablet TAKE 1 TABLET EVERY DAY 90 tablet 3   co-enzyme Q-10 30 MG capsule Take 100 mg by mouth daily.     fluconazole (DIFLUCAN) 200 MG tablet Take 1 tablet (200 mg total) by mouth daily.     fluticasone (FLONASE) 50 MCG/ACT nasal spray USE 2 SPRAYS IN EACH NOSTRIL EVERY DAY 48 g 3   lactase (LACTAID) 3000 units tablet Take 2 tablets (6,000 Units total) by mouth 3 (three) times daily with meals. (Patient taking differently: Take 6,000 Units by mouth See admin instructions. Take 6,000 units (2 tablets) by mouth up to three times a day WITH DAIRY PRODUCTS)     lansoprazole (PREVACID) 30 MG capsule TAKE 1 CAPSULE DAILY AT 12 NOON 90 capsule 3   levofloxacin (LEVAQUIN) 500 MG tablet Take 1 tablet (500  mg total) by mouth daily.     Multiple Vitamin (MULTIVITAMIN) tablet Take 1 tablet by mouth at bedtime.     ondansetron (ZOFRAN) 4 MG tablet Take 1 tablet (4 mg total) by mouth every 8 (eight) hours as needed for nausea or vomiting. 20 tablet 0   topiramate (TOPAMAX) 25 MG tablet Take 1 tablet (25 mg total) by mouth daily AND 2 tablets (50 mg total) at bedtime. 270 tablet 1   TRUE METRIX BLOOD GLUCOSE TEST test strip TEST BLOOD SUGAR ONE TIME DAILY 100 strip 3   TRUEplus Lancets 33G MISC TEST BLOOD SUGAR ONCE DAILY (NEED MD APPOINTMENT) 100 each 3   Ubrogepant (UBRELVY) 50 MG TABS Take 1 tab at onset of  migraine.  May repeat in 2 hrs, if needed.  Max dose: 2 tabs/day. This is a 30 day prescription. 12 tablet 11   valACYclovir (VALTREX) 500 MG tablet Take 1 tablet (500 mg total) by mouth 2 (two) times daily.     vitamin B-12 (CYANOCOBALAMIN) 1000 MCG tablet Take 1 tablet (1,000 mcg total) by mouth every Monday, Wednesday, and Friday.     No facility-administered medications prior to visit.     Per HPI unless specifically indicated in ROS section below Review of Systems  Objective:  BP 110/66   Pulse 80   Temp 98.2 F (36.8 C) (Oral)   Ht 5\' 2"  (1.575 m)   SpO2 98%   BMI 30.00 kg/m   Wt Readings from Last 3 Encounters:  12/12/22 164 lb (74.4 kg)  10/18/22 166 lb 6 oz (75.5 kg)  10/17/22 164 lb (74.4 kg)      Physical Exam Vitals and nursing note reviewed.  Constitutional:      Appearance: Normal appearance. She is not ill-appearing.     Comments: Tired appearing, sitting in wheelchair  HENT:     Head: Normocephalic and atraumatic.     Mouth/Throat:     Mouth: Mucous membranes are moist.     Pharynx: Oropharynx is clear. No oropharyngeal exudate or posterior oropharyngeal erythema.  Eyes:     Extraocular Movements: Extraocular movements intact.     Pupils: Pupils are equal, round, and reactive to light.  Cardiovascular:     Rate and Rhythm: Normal rate and regular rhythm.     Pulses: Normal pulses.     Heart sounds: Normal heart sounds. No murmur heard. Pulmonary:     Effort: Pulmonary effort is normal. No respiratory distress.     Breath sounds: Normal breath sounds. No wheezing, rhonchi or rales.  Abdominal:     General: Bowel sounds are normal. There is no distension.     Palpations: Abdomen is soft. There is no mass.     Tenderness: There is no abdominal tenderness. There is no guarding or rebound.  Musculoskeletal:        General: Swelling and tenderness present.     Right lower leg: Edema (1+) present.     Left lower leg: Edema (tr) present.  Skin:     General: Skin is warm and dry.     Findings: No rash.  Neurological:     Mental Status: She is alert.  Psychiatric:        Mood and Affect: Mood normal.        Behavior: Behavior normal.       Results for orders placed or performed in visit on 12/16/22  HM DIABETES EYE EXAM  Result Value Ref Range   HM Diabetic Eye Exam  Lab Results  Component Value Date   HGBA1C 6.4 (A) 07/27/2022       03/01/2023    2:16 PM 10/18/2022    4:13 PM 10/17/2022    8:50 AM 10/05/2022   11:21 AM 07/27/2022    3:34 PM  Depression screen PHQ 2/9  Decreased Interest 2 0 0 0 0  Down, Depressed, Hopeless 2 0 0 0 0  PHQ - 2 Score 4 0 0 0 0  Altered sleeping 2 1 0 2   Tired, decreased energy 0 0 0 2   Change in appetite 3 1 0 0   Feeling bad or failure about yourself  1 0 0 0   Trouble concentrating 0 0 0 0   Moving slowly or fidgety/restless 1 0 0 0   Suicidal thoughts 0 0 0 0   PHQ-9 Score 11 2 0 4   Difficult doing work/chores Extremely dIfficult Not difficult at all          03/01/2023    2:16 PM 10/18/2022    4:13 PM 10/05/2022   11:27 AM 07/27/2022    3:34 PM  GAD 7 : Generalized Anxiety Score  Nervous, Anxious, on Edge 0 0 0 0  Control/stop worrying 0 0 0 0  Worry too much - different things 3 0 0 0  Trouble relaxing 2 0 0 0  Restless 0  0 0  Easily annoyed or irritable 3 1 1  0  Afraid - awful might happen 0 1 0 0  Total GAD 7 Score 8  1 0  Anxiety Difficulty  Not difficult at all     Assessment & Plan:   Problem List Items Addressed This Visit     Type 2 diabetes mellitus with other specified complication (HCC)    Remains diet controlled.       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 Seeing regularly, receiving chemotherapy, just had port placed. Continue current regimen and close f/u. Caution in immunocompromised status.      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       Anorexia    Loss of appetite with dysgeusia in setting of chemotherapy use.   Encouraged continued Ensure supplement intake.       Adjustment disorder with depressed mood    Notes situational depressed mood due to recent illness.  Would be interested in trial antidepressant - will start sertraline 25mg  daily with option to increase dose.  Reviewed side effects to monitor for.         Meds ordered this encounter  Medications   sertraline (ZOLOFT) 25 MG tablet    Sig: Take 1 tablet (25 mg total) by mouth daily.    Dispense:  30 tablet    Refill:  6    No orders of the defined types were placed in this encounter.   Patient Instructions  Good to see you today Continue ensure shake 1 a day (ie 1/2 can twice daily).  Keep oncology appointments and treatment schedule  Return in 4-6 months for follow up visit   Follow up plan: Return in about 6 months (around 08/30/2023), or if symptoms worsen or fail to improve, for annual exam, prior fasting for blood work, medicare wellness visit.  Eustaquio Boyden, MD

## 2023-03-02 DIAGNOSIS — T451X5D Adverse effect of antineoplastic and immunosuppressive drugs, subsequent encounter: Secondary | ICD-10-CM | POA: Diagnosis not present

## 2023-03-02 DIAGNOSIS — D72829 Elevated white blood cell count, unspecified: Secondary | ICD-10-CM | POA: Diagnosis not present

## 2023-03-02 DIAGNOSIS — D8481 Immunodeficiency due to conditions classified elsewhere: Secondary | ICD-10-CM | POA: Diagnosis not present

## 2023-03-02 DIAGNOSIS — E1169 Type 2 diabetes mellitus with other specified complication: Secondary | ICD-10-CM | POA: Diagnosis not present

## 2023-03-02 DIAGNOSIS — D84821 Immunodeficiency due to drugs: Secondary | ICD-10-CM | POA: Diagnosis not present

## 2023-03-02 DIAGNOSIS — D649 Anemia, unspecified: Secondary | ICD-10-CM | POA: Diagnosis not present

## 2023-03-02 DIAGNOSIS — E785 Hyperlipidemia, unspecified: Secondary | ICD-10-CM | POA: Diagnosis not present

## 2023-03-02 DIAGNOSIS — D709 Neutropenia, unspecified: Secondary | ICD-10-CM | POA: Diagnosis not present

## 2023-03-02 DIAGNOSIS — R110 Nausea: Secondary | ICD-10-CM | POA: Diagnosis not present

## 2023-03-03 DIAGNOSIS — D649 Anemia, unspecified: Secondary | ICD-10-CM | POA: Diagnosis not present

## 2023-03-03 DIAGNOSIS — C9200 Acute myeloblastic leukemia, not having achieved remission: Secondary | ICD-10-CM | POA: Diagnosis not present

## 2023-03-04 ENCOUNTER — Encounter: Payer: Self-pay | Admitting: Family Medicine

## 2023-03-04 DIAGNOSIS — D630 Anemia in neoplastic disease: Secondary | ICD-10-CM | POA: Diagnosis not present

## 2023-03-04 DIAGNOSIS — C9200 Acute myeloblastic leukemia, not having achieved remission: Secondary | ICD-10-CM | POA: Insufficient documentation

## 2023-03-04 DIAGNOSIS — D84821 Immunodeficiency due to drugs: Secondary | ICD-10-CM | POA: Insufficient documentation

## 2023-03-04 DIAGNOSIS — F4321 Adjustment disorder with depressed mood: Secondary | ICD-10-CM | POA: Insufficient documentation

## 2023-03-04 DIAGNOSIS — R630 Anorexia: Secondary | ICD-10-CM | POA: Insufficient documentation

## 2023-03-04 NOTE — Assessment & Plan Note (Signed)
Chemotherapy and AML related.

## 2023-03-04 NOTE — Assessment & Plan Note (Signed)
Remains diet controlled.

## 2023-03-04 NOTE — Assessment & Plan Note (Addendum)
Loss of appetite with dysgeusia in setting of chemotherapy use.   Encouraged continued Ensure supplement intake.

## 2023-03-04 NOTE — Assessment & Plan Note (Signed)
Appreciate onc care. Seeing regularly, receiving chemotherapy, just had port placed. Continue current regimen and close f/u. Caution in immunocompromised status.

## 2023-03-04 NOTE — Assessment & Plan Note (Signed)
Notes situational depressed mood due to recent illness.  Would be interested in trial antidepressant - will start sertraline 25mg  daily with option to increase dose.  Reviewed side effects to monitor for.

## 2023-03-06 DIAGNOSIS — D649 Anemia, unspecified: Secondary | ICD-10-CM | POA: Diagnosis not present

## 2023-03-06 DIAGNOSIS — D709 Neutropenia, unspecified: Secondary | ICD-10-CM | POA: Diagnosis not present

## 2023-03-06 DIAGNOSIS — E1169 Type 2 diabetes mellitus with other specified complication: Secondary | ICD-10-CM | POA: Diagnosis not present

## 2023-03-06 DIAGNOSIS — T451X5D Adverse effect of antineoplastic and immunosuppressive drugs, subsequent encounter: Secondary | ICD-10-CM | POA: Diagnosis not present

## 2023-03-06 DIAGNOSIS — D72829 Elevated white blood cell count, unspecified: Secondary | ICD-10-CM | POA: Diagnosis not present

## 2023-03-06 DIAGNOSIS — R110 Nausea: Secondary | ICD-10-CM | POA: Diagnosis not present

## 2023-03-06 DIAGNOSIS — D84821 Immunodeficiency due to drugs: Secondary | ICD-10-CM | POA: Diagnosis not present

## 2023-03-06 DIAGNOSIS — D8481 Immunodeficiency due to conditions classified elsewhere: Secondary | ICD-10-CM | POA: Diagnosis not present

## 2023-03-06 DIAGNOSIS — E785 Hyperlipidemia, unspecified: Secondary | ICD-10-CM | POA: Diagnosis not present

## 2023-03-07 DIAGNOSIS — D84821 Immunodeficiency due to drugs: Secondary | ICD-10-CM | POA: Diagnosis not present

## 2023-03-07 DIAGNOSIS — D8481 Immunodeficiency due to conditions classified elsewhere: Secondary | ICD-10-CM | POA: Diagnosis not present

## 2023-03-07 DIAGNOSIS — R110 Nausea: Secondary | ICD-10-CM | POA: Diagnosis not present

## 2023-03-07 DIAGNOSIS — T451X5D Adverse effect of antineoplastic and immunosuppressive drugs, subsequent encounter: Secondary | ICD-10-CM | POA: Diagnosis not present

## 2023-03-07 DIAGNOSIS — E1169 Type 2 diabetes mellitus with other specified complication: Secondary | ICD-10-CM | POA: Diagnosis not present

## 2023-03-07 DIAGNOSIS — D649 Anemia, unspecified: Secondary | ICD-10-CM | POA: Diagnosis not present

## 2023-03-07 DIAGNOSIS — D709 Neutropenia, unspecified: Secondary | ICD-10-CM | POA: Diagnosis not present

## 2023-03-07 DIAGNOSIS — D72829 Elevated white blood cell count, unspecified: Secondary | ICD-10-CM | POA: Diagnosis not present

## 2023-03-07 DIAGNOSIS — E785 Hyperlipidemia, unspecified: Secondary | ICD-10-CM | POA: Diagnosis not present

## 2023-03-08 DIAGNOSIS — E876 Hypokalemia: Secondary | ICD-10-CM | POA: Diagnosis not present

## 2023-03-08 DIAGNOSIS — D72829 Elevated white blood cell count, unspecified: Secondary | ICD-10-CM | POA: Diagnosis not present

## 2023-03-08 DIAGNOSIS — C9200 Acute myeloblastic leukemia, not having achieved remission: Secondary | ICD-10-CM | POA: Diagnosis not present

## 2023-03-08 DIAGNOSIS — Z5111 Encounter for antineoplastic chemotherapy: Secondary | ICD-10-CM | POA: Diagnosis not present

## 2023-03-09 DIAGNOSIS — C9200 Acute myeloblastic leukemia, not having achieved remission: Secondary | ICD-10-CM | POA: Diagnosis not present

## 2023-03-09 DIAGNOSIS — Z5111 Encounter for antineoplastic chemotherapy: Secondary | ICD-10-CM | POA: Diagnosis not present

## 2023-03-10 DIAGNOSIS — D72829 Elevated white blood cell count, unspecified: Secondary | ICD-10-CM | POA: Diagnosis not present

## 2023-03-10 DIAGNOSIS — C9200 Acute myeloblastic leukemia, not having achieved remission: Secondary | ICD-10-CM | POA: Diagnosis not present

## 2023-03-10 DIAGNOSIS — Z5111 Encounter for antineoplastic chemotherapy: Secondary | ICD-10-CM | POA: Diagnosis not present

## 2023-03-13 DIAGNOSIS — Z5111 Encounter for antineoplastic chemotherapy: Secondary | ICD-10-CM | POA: Diagnosis not present

## 2023-03-13 DIAGNOSIS — C9200 Acute myeloblastic leukemia, not having achieved remission: Secondary | ICD-10-CM | POA: Diagnosis not present

## 2023-03-14 DIAGNOSIS — Z5111 Encounter for antineoplastic chemotherapy: Secondary | ICD-10-CM | POA: Diagnosis not present

## 2023-03-14 DIAGNOSIS — C9200 Acute myeloblastic leukemia, not having achieved remission: Secondary | ICD-10-CM | POA: Diagnosis not present

## 2023-03-15 DIAGNOSIS — R110 Nausea: Secondary | ICD-10-CM | POA: Diagnosis not present

## 2023-03-15 DIAGNOSIS — D72829 Elevated white blood cell count, unspecified: Secondary | ICD-10-CM | POA: Diagnosis not present

## 2023-03-15 DIAGNOSIS — T451X5D Adverse effect of antineoplastic and immunosuppressive drugs, subsequent encounter: Secondary | ICD-10-CM | POA: Diagnosis not present

## 2023-03-15 DIAGNOSIS — E785 Hyperlipidemia, unspecified: Secondary | ICD-10-CM | POA: Diagnosis not present

## 2023-03-15 DIAGNOSIS — D84821 Immunodeficiency due to drugs: Secondary | ICD-10-CM | POA: Diagnosis not present

## 2023-03-15 DIAGNOSIS — C9200 Acute myeloblastic leukemia, not having achieved remission: Secondary | ICD-10-CM | POA: Diagnosis not present

## 2023-03-15 DIAGNOSIS — D649 Anemia, unspecified: Secondary | ICD-10-CM | POA: Diagnosis not present

## 2023-03-15 DIAGNOSIS — D709 Neutropenia, unspecified: Secondary | ICD-10-CM | POA: Diagnosis not present

## 2023-03-15 DIAGNOSIS — D8481 Immunodeficiency due to conditions classified elsewhere: Secondary | ICD-10-CM | POA: Diagnosis not present

## 2023-03-15 DIAGNOSIS — E1169 Type 2 diabetes mellitus with other specified complication: Secondary | ICD-10-CM | POA: Diagnosis not present

## 2023-03-16 DIAGNOSIS — E785 Hyperlipidemia, unspecified: Secondary | ICD-10-CM | POA: Diagnosis not present

## 2023-03-16 DIAGNOSIS — D84821 Immunodeficiency due to drugs: Secondary | ICD-10-CM | POA: Diagnosis not present

## 2023-03-16 DIAGNOSIS — T451X5D Adverse effect of antineoplastic and immunosuppressive drugs, subsequent encounter: Secondary | ICD-10-CM | POA: Diagnosis not present

## 2023-03-16 DIAGNOSIS — E1169 Type 2 diabetes mellitus with other specified complication: Secondary | ICD-10-CM | POA: Diagnosis not present

## 2023-03-16 DIAGNOSIS — D649 Anemia, unspecified: Secondary | ICD-10-CM | POA: Diagnosis not present

## 2023-03-16 DIAGNOSIS — D709 Neutropenia, unspecified: Secondary | ICD-10-CM | POA: Diagnosis not present

## 2023-03-16 DIAGNOSIS — D72829 Elevated white blood cell count, unspecified: Secondary | ICD-10-CM | POA: Diagnosis not present

## 2023-03-16 DIAGNOSIS — R110 Nausea: Secondary | ICD-10-CM | POA: Diagnosis not present

## 2023-03-16 DIAGNOSIS — D8481 Immunodeficiency due to conditions classified elsewhere: Secondary | ICD-10-CM | POA: Diagnosis not present

## 2023-03-20 DIAGNOSIS — R110 Nausea: Secondary | ICD-10-CM | POA: Diagnosis not present

## 2023-03-20 DIAGNOSIS — D709 Neutropenia, unspecified: Secondary | ICD-10-CM | POA: Diagnosis not present

## 2023-03-20 DIAGNOSIS — D84821 Immunodeficiency due to drugs: Secondary | ICD-10-CM | POA: Diagnosis not present

## 2023-03-20 DIAGNOSIS — E1169 Type 2 diabetes mellitus with other specified complication: Secondary | ICD-10-CM | POA: Diagnosis not present

## 2023-03-20 DIAGNOSIS — T451X5D Adverse effect of antineoplastic and immunosuppressive drugs, subsequent encounter: Secondary | ICD-10-CM | POA: Diagnosis not present

## 2023-03-20 DIAGNOSIS — D649 Anemia, unspecified: Secondary | ICD-10-CM | POA: Diagnosis not present

## 2023-03-20 DIAGNOSIS — E785 Hyperlipidemia, unspecified: Secondary | ICD-10-CM | POA: Diagnosis not present

## 2023-03-20 DIAGNOSIS — D8481 Immunodeficiency due to conditions classified elsewhere: Secondary | ICD-10-CM | POA: Diagnosis not present

## 2023-03-20 DIAGNOSIS — D72829 Elevated white blood cell count, unspecified: Secondary | ICD-10-CM | POA: Diagnosis not present

## 2023-03-24 DIAGNOSIS — C9200 Acute myeloblastic leukemia, not having achieved remission: Secondary | ICD-10-CM | POA: Diagnosis not present

## 2023-03-27 DIAGNOSIS — R110 Nausea: Secondary | ICD-10-CM | POA: Diagnosis not present

## 2023-03-27 DIAGNOSIS — D709 Neutropenia, unspecified: Secondary | ICD-10-CM | POA: Diagnosis not present

## 2023-03-27 DIAGNOSIS — D72829 Elevated white blood cell count, unspecified: Secondary | ICD-10-CM | POA: Diagnosis not present

## 2023-03-27 DIAGNOSIS — T451X5D Adverse effect of antineoplastic and immunosuppressive drugs, subsequent encounter: Secondary | ICD-10-CM | POA: Diagnosis not present

## 2023-03-27 DIAGNOSIS — D649 Anemia, unspecified: Secondary | ICD-10-CM | POA: Diagnosis not present

## 2023-03-27 DIAGNOSIS — D84821 Immunodeficiency due to drugs: Secondary | ICD-10-CM | POA: Diagnosis not present

## 2023-03-27 DIAGNOSIS — E1169 Type 2 diabetes mellitus with other specified complication: Secondary | ICD-10-CM | POA: Diagnosis not present

## 2023-03-27 DIAGNOSIS — D8481 Immunodeficiency due to conditions classified elsewhere: Secondary | ICD-10-CM | POA: Diagnosis not present

## 2023-03-27 DIAGNOSIS — E785 Hyperlipidemia, unspecified: Secondary | ICD-10-CM | POA: Diagnosis not present

## 2023-03-28 DIAGNOSIS — R110 Nausea: Secondary | ICD-10-CM | POA: Diagnosis not present

## 2023-03-28 DIAGNOSIS — D72829 Elevated white blood cell count, unspecified: Secondary | ICD-10-CM | POA: Diagnosis not present

## 2023-03-28 DIAGNOSIS — D649 Anemia, unspecified: Secondary | ICD-10-CM | POA: Diagnosis not present

## 2023-03-28 DIAGNOSIS — E1169 Type 2 diabetes mellitus with other specified complication: Secondary | ICD-10-CM | POA: Diagnosis not present

## 2023-03-28 DIAGNOSIS — D709 Neutropenia, unspecified: Secondary | ICD-10-CM | POA: Diagnosis not present

## 2023-03-28 DIAGNOSIS — T451X5D Adverse effect of antineoplastic and immunosuppressive drugs, subsequent encounter: Secondary | ICD-10-CM | POA: Diagnosis not present

## 2023-03-28 DIAGNOSIS — D8481 Immunodeficiency due to conditions classified elsewhere: Secondary | ICD-10-CM | POA: Diagnosis not present

## 2023-03-28 DIAGNOSIS — E785 Hyperlipidemia, unspecified: Secondary | ICD-10-CM | POA: Diagnosis not present

## 2023-03-28 DIAGNOSIS — D84821 Immunodeficiency due to drugs: Secondary | ICD-10-CM | POA: Diagnosis not present

## 2023-03-29 DIAGNOSIS — C9200 Acute myeloblastic leukemia, not having achieved remission: Secondary | ICD-10-CM | POA: Diagnosis not present

## 2023-04-05 DIAGNOSIS — C9200 Acute myeloblastic leukemia, not having achieved remission: Secondary | ICD-10-CM | POA: Diagnosis not present

## 2023-04-05 DIAGNOSIS — K1379 Other lesions of oral mucosa: Secondary | ICD-10-CM | POA: Diagnosis not present

## 2023-04-05 DIAGNOSIS — R112 Nausea with vomiting, unspecified: Secondary | ICD-10-CM | POA: Diagnosis not present

## 2023-04-05 DIAGNOSIS — E876 Hypokalemia: Secondary | ICD-10-CM | POA: Diagnosis not present

## 2023-04-05 DIAGNOSIS — T451X5A Adverse effect of antineoplastic and immunosuppressive drugs, initial encounter: Secondary | ICD-10-CM | POA: Diagnosis not present

## 2023-04-12 DIAGNOSIS — T451X5A Adverse effect of antineoplastic and immunosuppressive drugs, initial encounter: Secondary | ICD-10-CM | POA: Diagnosis not present

## 2023-04-12 DIAGNOSIS — R112 Nausea with vomiting, unspecified: Secondary | ICD-10-CM | POA: Diagnosis not present

## 2023-04-12 DIAGNOSIS — Z5111 Encounter for antineoplastic chemotherapy: Secondary | ICD-10-CM | POA: Diagnosis not present

## 2023-04-12 DIAGNOSIS — D72829 Elevated white blood cell count, unspecified: Secondary | ICD-10-CM | POA: Diagnosis not present

## 2023-04-12 DIAGNOSIS — E876 Hypokalemia: Secondary | ICD-10-CM | POA: Diagnosis not present

## 2023-04-12 DIAGNOSIS — C9200 Acute myeloblastic leukemia, not having achieved remission: Secondary | ICD-10-CM | POA: Diagnosis not present

## 2023-04-13 DIAGNOSIS — C9200 Acute myeloblastic leukemia, not having achieved remission: Secondary | ICD-10-CM | POA: Diagnosis not present

## 2023-04-13 DIAGNOSIS — Z5111 Encounter for antineoplastic chemotherapy: Secondary | ICD-10-CM | POA: Diagnosis not present

## 2023-04-13 DIAGNOSIS — D72829 Elevated white blood cell count, unspecified: Secondary | ICD-10-CM | POA: Diagnosis not present

## 2023-04-14 DIAGNOSIS — Z5111 Encounter for antineoplastic chemotherapy: Secondary | ICD-10-CM | POA: Diagnosis not present

## 2023-04-14 DIAGNOSIS — C9200 Acute myeloblastic leukemia, not having achieved remission: Secondary | ICD-10-CM | POA: Diagnosis not present

## 2023-04-17 DIAGNOSIS — Z5111 Encounter for antineoplastic chemotherapy: Secondary | ICD-10-CM | POA: Diagnosis not present

## 2023-04-17 DIAGNOSIS — C9200 Acute myeloblastic leukemia, not having achieved remission: Secondary | ICD-10-CM | POA: Diagnosis not present

## 2023-04-18 DIAGNOSIS — C9200 Acute myeloblastic leukemia, not having achieved remission: Secondary | ICD-10-CM | POA: Diagnosis not present

## 2023-04-18 DIAGNOSIS — Z5111 Encounter for antineoplastic chemotherapy: Secondary | ICD-10-CM | POA: Diagnosis not present

## 2023-04-21 DIAGNOSIS — C9200 Acute myeloblastic leukemia, not having achieved remission: Secondary | ICD-10-CM | POA: Diagnosis not present

## 2023-04-24 DIAGNOSIS — D630 Anemia in neoplastic disease: Secondary | ICD-10-CM | POA: Diagnosis not present

## 2023-04-24 DIAGNOSIS — C9200 Acute myeloblastic leukemia, not having achieved remission: Secondary | ICD-10-CM | POA: Diagnosis not present

## 2023-05-01 DIAGNOSIS — C9200 Acute myeloblastic leukemia, not having achieved remission: Secondary | ICD-10-CM | POA: Diagnosis not present

## 2023-05-08 DIAGNOSIS — B370 Candidal stomatitis: Secondary | ICD-10-CM | POA: Diagnosis not present

## 2023-05-08 DIAGNOSIS — C9200 Acute myeloblastic leukemia, not having achieved remission: Secondary | ICD-10-CM | POA: Diagnosis not present

## 2023-05-17 DIAGNOSIS — C9200 Acute myeloblastic leukemia, not having achieved remission: Secondary | ICD-10-CM | POA: Diagnosis not present

## 2023-05-17 DIAGNOSIS — D72829 Elevated white blood cell count, unspecified: Secondary | ICD-10-CM | POA: Diagnosis not present

## 2023-05-18 DIAGNOSIS — Z1231 Encounter for screening mammogram for malignant neoplasm of breast: Secondary | ICD-10-CM | POA: Diagnosis not present

## 2023-05-18 LAB — HM MAMMOGRAPHY

## 2023-05-19 ENCOUNTER — Encounter: Payer: Self-pay | Admitting: Family Medicine

## 2023-05-24 ENCOUNTER — Other Ambulatory Visit: Payer: Self-pay | Admitting: Family Medicine

## 2023-05-24 DIAGNOSIS — G43109 Migraine with aura, not intractable, without status migrainosus: Secondary | ICD-10-CM

## 2023-05-24 NOTE — Telephone Encounter (Signed)
 Topamax  Last filled:  03/21/23, #270 Last OV:  03/01/23, hosp f/u Next OV:  08/30/23, 4-6 mo f/u

## 2023-05-25 NOTE — Telephone Encounter (Signed)
ERx 

## 2023-05-31 DIAGNOSIS — C9200 Acute myeloblastic leukemia, not having achieved remission: Secondary | ICD-10-CM | POA: Diagnosis not present

## 2023-05-31 DIAGNOSIS — Z5111 Encounter for antineoplastic chemotherapy: Secondary | ICD-10-CM | POA: Diagnosis not present

## 2023-06-01 DIAGNOSIS — Z5111 Encounter for antineoplastic chemotherapy: Secondary | ICD-10-CM | POA: Diagnosis not present

## 2023-06-01 DIAGNOSIS — C9200 Acute myeloblastic leukemia, not having achieved remission: Secondary | ICD-10-CM | POA: Diagnosis not present

## 2023-06-02 DIAGNOSIS — Z5111 Encounter for antineoplastic chemotherapy: Secondary | ICD-10-CM | POA: Diagnosis not present

## 2023-06-02 DIAGNOSIS — C9200 Acute myeloblastic leukemia, not having achieved remission: Secondary | ICD-10-CM | POA: Diagnosis not present

## 2023-06-05 DIAGNOSIS — Z5111 Encounter for antineoplastic chemotherapy: Secondary | ICD-10-CM | POA: Diagnosis not present

## 2023-06-05 DIAGNOSIS — C9200 Acute myeloblastic leukemia, not having achieved remission: Secondary | ICD-10-CM | POA: Diagnosis not present

## 2023-06-06 DIAGNOSIS — C9200 Acute myeloblastic leukemia, not having achieved remission: Secondary | ICD-10-CM | POA: Diagnosis not present

## 2023-06-06 DIAGNOSIS — Z5111 Encounter for antineoplastic chemotherapy: Secondary | ICD-10-CM | POA: Diagnosis not present

## 2023-06-12 DIAGNOSIS — C9200 Acute myeloblastic leukemia, not having achieved remission: Secondary | ICD-10-CM | POA: Diagnosis not present

## 2023-06-19 DIAGNOSIS — C9200 Acute myeloblastic leukemia, not having achieved remission: Secondary | ICD-10-CM | POA: Diagnosis not present

## 2023-06-28 DIAGNOSIS — C9200 Acute myeloblastic leukemia, not having achieved remission: Secondary | ICD-10-CM | POA: Diagnosis not present

## 2023-06-28 DIAGNOSIS — J00 Acute nasopharyngitis [common cold]: Secondary | ICD-10-CM | POA: Diagnosis not present

## 2023-06-28 DIAGNOSIS — E876 Hypokalemia: Secondary | ICD-10-CM | POA: Diagnosis not present

## 2023-06-28 DIAGNOSIS — R051 Acute cough: Secondary | ICD-10-CM | POA: Diagnosis not present

## 2023-07-05 DIAGNOSIS — D72829 Elevated white blood cell count, unspecified: Secondary | ICD-10-CM | POA: Diagnosis not present

## 2023-07-05 DIAGNOSIS — C9200 Acute myeloblastic leukemia, not having achieved remission: Secondary | ICD-10-CM | POA: Diagnosis not present

## 2023-07-13 ENCOUNTER — Other Ambulatory Visit: Payer: Self-pay | Admitting: Family Medicine

## 2023-07-13 DIAGNOSIS — E1169 Type 2 diabetes mellitus with other specified complication: Secondary | ICD-10-CM

## 2023-07-14 DIAGNOSIS — T451X5A Adverse effect of antineoplastic and immunosuppressive drugs, initial encounter: Secondary | ICD-10-CM | POA: Diagnosis not present

## 2023-07-14 DIAGNOSIS — C9200 Acute myeloblastic leukemia, not having achieved remission: Secondary | ICD-10-CM | POA: Diagnosis not present

## 2023-07-14 DIAGNOSIS — R112 Nausea with vomiting, unspecified: Secondary | ICD-10-CM | POA: Diagnosis not present

## 2023-07-14 DIAGNOSIS — M48061 Spinal stenosis, lumbar region without neurogenic claudication: Secondary | ICD-10-CM | POA: Diagnosis not present

## 2023-07-20 DIAGNOSIS — C9200 Acute myeloblastic leukemia, not having achieved remission: Secondary | ICD-10-CM | POA: Diagnosis not present

## 2023-07-20 DIAGNOSIS — Z5111 Encounter for antineoplastic chemotherapy: Secondary | ICD-10-CM | POA: Diagnosis not present

## 2023-07-20 DIAGNOSIS — Z79899 Other long term (current) drug therapy: Secondary | ICD-10-CM | POA: Diagnosis not present

## 2023-07-21 DIAGNOSIS — D72829 Elevated white blood cell count, unspecified: Secondary | ICD-10-CM | POA: Diagnosis not present

## 2023-07-21 DIAGNOSIS — Z5111 Encounter for antineoplastic chemotherapy: Secondary | ICD-10-CM | POA: Diagnosis not present

## 2023-07-21 DIAGNOSIS — C9200 Acute myeloblastic leukemia, not having achieved remission: Secondary | ICD-10-CM | POA: Diagnosis not present

## 2023-07-24 DIAGNOSIS — D72829 Elevated white blood cell count, unspecified: Secondary | ICD-10-CM | POA: Diagnosis not present

## 2023-07-24 DIAGNOSIS — C9200 Acute myeloblastic leukemia, not having achieved remission: Secondary | ICD-10-CM | POA: Diagnosis not present

## 2023-07-24 DIAGNOSIS — Z5111 Encounter for antineoplastic chemotherapy: Secondary | ICD-10-CM | POA: Diagnosis not present

## 2023-07-25 DIAGNOSIS — C9200 Acute myeloblastic leukemia, not having achieved remission: Secondary | ICD-10-CM | POA: Diagnosis not present

## 2023-07-25 DIAGNOSIS — Z5111 Encounter for antineoplastic chemotherapy: Secondary | ICD-10-CM | POA: Diagnosis not present

## 2023-07-26 DIAGNOSIS — C9200 Acute myeloblastic leukemia, not having achieved remission: Secondary | ICD-10-CM | POA: Diagnosis not present

## 2023-07-26 DIAGNOSIS — Z5111 Encounter for antineoplastic chemotherapy: Secondary | ICD-10-CM | POA: Diagnosis not present

## 2023-07-31 ENCOUNTER — Other Ambulatory Visit: Payer: Self-pay | Admitting: Family Medicine

## 2023-07-31 ENCOUNTER — Other Ambulatory Visit: Payer: Self-pay | Admitting: Neurology

## 2023-07-31 DIAGNOSIS — E118 Type 2 diabetes mellitus with unspecified complications: Secondary | ICD-10-CM

## 2023-07-31 NOTE — Telephone Encounter (Signed)
 Last seen 03/29/21, next appt not scheduled

## 2023-08-01 ENCOUNTER — Other Ambulatory Visit: Payer: Self-pay | Admitting: Family Medicine

## 2023-08-01 DIAGNOSIS — E118 Type 2 diabetes mellitus with unspecified complications: Secondary | ICD-10-CM

## 2023-08-02 NOTE — Telephone Encounter (Signed)
 Rx sent 08/01/23, #1 kit/0 refills to [facility] order pharmacy.   Request denied.

## 2023-08-17 DIAGNOSIS — D72829 Elevated white blood cell count, unspecified: Secondary | ICD-10-CM | POA: Diagnosis not present

## 2023-08-17 DIAGNOSIS — C9200 Acute myeloblastic leukemia, not having achieved remission: Secondary | ICD-10-CM | POA: Diagnosis not present

## 2023-08-17 DIAGNOSIS — Z5111 Encounter for antineoplastic chemotherapy: Secondary | ICD-10-CM | POA: Diagnosis not present

## 2023-08-18 DIAGNOSIS — Z5111 Encounter for antineoplastic chemotherapy: Secondary | ICD-10-CM | POA: Diagnosis not present

## 2023-08-18 DIAGNOSIS — C9200 Acute myeloblastic leukemia, not having achieved remission: Secondary | ICD-10-CM | POA: Diagnosis not present

## 2023-08-18 DIAGNOSIS — D72829 Elevated white blood cell count, unspecified: Secondary | ICD-10-CM | POA: Diagnosis not present

## 2023-08-21 DIAGNOSIS — D72829 Elevated white blood cell count, unspecified: Secondary | ICD-10-CM | POA: Diagnosis not present

## 2023-08-21 DIAGNOSIS — Z5111 Encounter for antineoplastic chemotherapy: Secondary | ICD-10-CM | POA: Diagnosis not present

## 2023-08-21 DIAGNOSIS — C9200 Acute myeloblastic leukemia, not having achieved remission: Secondary | ICD-10-CM | POA: Diagnosis not present

## 2023-08-22 DIAGNOSIS — Z5111 Encounter for antineoplastic chemotherapy: Secondary | ICD-10-CM | POA: Diagnosis not present

## 2023-08-22 DIAGNOSIS — D72829 Elevated white blood cell count, unspecified: Secondary | ICD-10-CM | POA: Diagnosis not present

## 2023-08-22 DIAGNOSIS — C9200 Acute myeloblastic leukemia, not having achieved remission: Secondary | ICD-10-CM | POA: Diagnosis not present

## 2023-08-23 DIAGNOSIS — C9200 Acute myeloblastic leukemia, not having achieved remission: Secondary | ICD-10-CM | POA: Diagnosis not present

## 2023-08-23 DIAGNOSIS — Z5111 Encounter for antineoplastic chemotherapy: Secondary | ICD-10-CM | POA: Diagnosis not present

## 2023-08-30 ENCOUNTER — Encounter: Payer: Self-pay | Admitting: Family Medicine

## 2023-08-30 ENCOUNTER — Ambulatory Visit (INDEPENDENT_AMBULATORY_CARE_PROVIDER_SITE_OTHER): Payer: Medicare HMO | Admitting: Family Medicine

## 2023-08-30 VITALS — BP 122/64 | HR 77 | Temp 97.8°F | Ht 62.0 in | Wt 122.5 lb

## 2023-08-30 DIAGNOSIS — M79604 Pain in right leg: Secondary | ICD-10-CM | POA: Diagnosis not present

## 2023-08-30 DIAGNOSIS — E1169 Type 2 diabetes mellitus with other specified complication: Secondary | ICD-10-CM | POA: Diagnosis not present

## 2023-08-30 DIAGNOSIS — D84821 Immunodeficiency due to drugs: Secondary | ICD-10-CM

## 2023-08-30 DIAGNOSIS — Z8673 Personal history of transient ischemic attack (TIA), and cerebral infarction without residual deficits: Secondary | ICD-10-CM | POA: Diagnosis not present

## 2023-08-30 DIAGNOSIS — R600 Localized edema: Secondary | ICD-10-CM

## 2023-08-30 DIAGNOSIS — Z79899 Other long term (current) drug therapy: Secondary | ICD-10-CM | POA: Diagnosis not present

## 2023-08-30 DIAGNOSIS — K219 Gastro-esophageal reflux disease without esophagitis: Secondary | ICD-10-CM

## 2023-08-30 DIAGNOSIS — C9200 Acute myeloblastic leukemia, not having achieved remission: Secondary | ICD-10-CM | POA: Diagnosis not present

## 2023-08-30 DIAGNOSIS — G43109 Migraine with aura, not intractable, without status migrainosus: Secondary | ICD-10-CM

## 2023-08-30 DIAGNOSIS — E785 Hyperlipidemia, unspecified: Secondary | ICD-10-CM | POA: Diagnosis not present

## 2023-08-30 MED ORDER — ACETAMINOPHEN 500 MG PO TABS: 1000.0000 mg | ORAL_TABLET | Freq: Three times a day (TID) | ORAL | Status: DC | PRN

## 2023-08-30 MED ORDER — METHOCARBAMOL 500 MG PO TABS: 500.0000 mg | ORAL_TABLET | Freq: Three times a day (TID) | ORAL | 0 refills | Status: DC | PRN

## 2023-08-30 MED ORDER — FUROSEMIDE 20 MG PO TABS
20.0000 mg | ORAL_TABLET | Freq: Two times a day (BID) | ORAL | 3 refills | Status: DC | PRN
Start: 2023-08-30 — End: 2023-09-30

## 2023-08-30 MED ORDER — ATORVASTATIN CALCIUM 40 MG PO TABS: 40.0000 mg | ORAL_TABLET | Freq: Every day | ORAL | 4 refills | Status: DC

## 2023-08-30 NOTE — Progress Notes (Signed)
 Ph: [phone] Fax: [phone]   Patient ID: Dana [name], female    DOB: 04/09/1946, 78 y.o.   MRN: [id number]  This visit was conducted in person.  BP 122/64   Pulse 77   Temp 97.8 F (36.6 C) (Oral)   Ht 5\' 2"  (1.575 m)   Wt 122 lb 8 oz (55.6 kg)   SpO2 97%   BMI 22.41 kg/m    CC: CPE - converted to acute visit  Subjective:   HPI: Dana [name] is a 78 y.o. female presenting on 08/30/2023 for Medicare Wellness (Pt accompanied by husband, Dana [name]. )   Saw health advisor 10/2022 for medicare wellness visit. Note reviewed.   Significant weight loss since last seen - with AML diagnosis.  Hearing Screening   500Hz  1000Hz  2000Hz  4000Hz   Right ear 20 25 25 20   Left ear 20 0 25 25  Vision Screening - Comments:: Last eye exam, 10/2022.   Flowsheet Row Office Visit from 08/30/2023 in [facility] [place]  PHQ-2 Total Score 1          08/30/2023    3:32 PM 03/01/2023    2:16 PM 10/18/2022    4:13 PM 10/17/2022    8:52 AM 10/05/2022   11:21 AM  Fall Risk   Falls in the past year? 1 0 0 0 0  Number falls in past yr: 0   0   Injury with Fall? 0   0   Risk for fall due to :    No Fall Risks   Follow up    Falls prevention discussed;Falls evaluation completed    AML - on azacitidine  and venetoclax  maintenance therapy, tolerating well. Followed by Dana [name] at [facility]. Recent bone marrow biopsy showed good response, indicating remission.   She regularly takes fluconazole 100mg  daily, valtrex 500mg  daily, and levaquin 500mg  daily for infection prevention while on chemotherapy.   Notes diffuse body aches - left hand/wrist, as well as R thigh/hip as well as swelling to ankles and feet and bilateral shoulder pain. Denies inciting trauma/injury or falls. Manages pain with tylenol  500mg  2 tab bid. Describes hand pain as burning/stinging, fingers numb.  She's been taking lasix  as needed for leg swelling   L thalamic capsular infarct 08/2020 with  residual R leg weakness.   Continues  lansoprazole  30mg  for GERD.  She is on sertraline  25mg  daily for mood along with topamax  25mg /50mg  daily for HA prevention.   CPE deferred.  Preventative: COLONOSCOPY 11/2005; diverticulosis o/w WNL Dana [name]). Cologuard WNL 2017, 2021 - defer Well woman exam at [facility] Dana [name]. Sees every q49yrs, last 12/2020. Normal pap smears.  Diagnostic mammogram 05/2021 at [facility] - rpt 1 yr  DEXA 2015 spine -0.4, hip -1.0 WNL  Lung cancer screening - not eligible  Flu shot - yearly COVID vaccine moderna 05/2019, 06/2019, Moderna booster 03/2020, 08/2020, bivalent 02/2021 Td unsure Pneumovax 2012, prevnar-13 2015, pneumovax 2017 zostavax - 2010. h/o shingles x2 Shingrix - 02/2018, 05/2018 Advanced directive discussion - durable POA is [name] - no HCPOA set up. Has living will. Full code. Does not want prolonged life support by extraordinary means. Scanned into chart 09/2016. Seat belt use discussed  Sunscreen use discussed, no changing moles on skin Non smoker  Alcohol - rare  Dentist - Q6 mo  Eye exam yearly  Bowels - no constipation Bladder - no incontinence   Patient is married and lives at home with  her [name]). Patient is retired.  Edu: high school education.  Right handed.  Exercise 3 times a week.  Diet: good water, fruits/vegetables daily - joined weight watchers but no longer.  Caffeine : None      Relevant past medical, surgical, family and social history reviewed and updated as indicated. Interim medical history since our last visit reviewed. Allergies and medications reviewed and updated. Outpatient Medications Prior to Visit  Medication Sig Dispense Refill   Alcohol Swabs (DROPSAFE ALCOHOL PREP) 70 % PADS USE TO TEST BLOOD SUGAR ONCE DAILY (NEED MD APPOINTMENT) 100 each 3   benzonatate  (TESSALON ) 200 MG capsule Take 1 capsule (200 mg total) by mouth 3 (three) times daily as needed for cough. 30 capsule 1    Blood Glucose Calibration (TRUE METRIX LEVEL 1) Low SOLN USE TO CHECK METER 1 each 0   Blood Glucose Monitoring Suppl (TRUE METRIX AIR GLUCOSE METER) w/Device KIT Use as instructed to check blood sugar once a day 1 kit 0   butalbital -acetaminophen -caffeine  (FIORICET) 50-325-40 MG tablet Take 1 tablet by mouth as needed for headache. TAKE 1 TABLET EVERY 6 HOURS AS NEEDED FOR HEADACHE 10 tablet 3   co-enzyme Q-10 30 MG capsule Take 100 mg by mouth daily.     fluconazole (DIFLUCAN) 200 MG tablet Take 1 tablet (200 mg total) by mouth daily.     fluticasone  (FLONASE ) 50 MCG/ACT nasal spray USE 2 SPRAYS IN EACH NOSTRIL EVERY DAY 48 g 3   lactase (LACTAID) 3000 units tablet Take 2 tablets (6,000 Units total) by mouth 3 (three) times daily with meals. (Patient taking differently: Take 6,000 Units by mouth See admin instructions. Take 6,000 units (2 tablets) by mouth up to three times a day WITH DAIRY PRODUCTS)     lansoprazole  (PREVACID ) 30 MG capsule TAKE 1 CAPSULE DAILY AT 12 NOON 90 capsule 3   levofloxacin (LEVAQUIN) 500 MG tablet Take 1 tablet (500 mg total) by mouth daily.     prochlorperazine  (COMPAZINE ) 5 MG tablet Take 5 mg by mouth every 6 (six) hours as needed.     sertraline  (ZOLOFT ) 25 MG tablet Take 1 tablet (25 mg total) by mouth daily. 30 tablet 6   topiramate  (TOPAMAX ) 25 MG tablet TAKE 1 TABLET BY MOUTH DAILY AND 2 TABLETS AT BEDTIME. 270 tablet 1   TRUE METRIX BLOOD GLUCOSE TEST test strip TEST BLOOD SUGAR ONE TIME DAILY 100 strip 3   TRUEplus Lancets 33G MISC TEST BLOOD SUGAR ONCE DAILY (NEED MD APPOINTMENT) 100 each 3   Ubrogepant  (UBRELVY ) 50 MG TABS Take 1 tab at onset of migraine.  May repeat in 2 hrs, if needed.  Max dose: 2 tabs/day. This is a 30 day prescription. 12 tablet 11   valACYclovir (VALTREX) 500 MG tablet Take 1 tablet (500 mg total) by mouth 2 (two) times daily.     vitamin B-12 (CYANOCOBALAMIN ) 1000 MCG tablet Take 1 tablet (1,000 mcg total) by mouth every Monday,  Wednesday, and Friday.     acetaminophen  (TYLENOL ) 325 MG tablet Take 1 tablet (325 mg total) by mouth every 6 (six) hours as needed for mild pain.     atorvastatin  (LIPITOR) 40 MG tablet TAKE 1 TABLET AT BEDTIME 90 tablet 0   clopidogrel  (PLAVIX ) 75 MG tablet TAKE 1 TABLET EVERY DAY 90 tablet 3   Multiple Vitamin (MULTIVITAMIN) tablet Take 1 tablet by mouth at bedtime.     ondansetron  (ZOFRAN ) 4 MG tablet Take 1 tablet (4 mg total) by mouth  every 8 (eight) hours as needed for nausea or vomiting. 20 tablet 0   No facility-administered medications prior to visit.     Per HPI unless specifically indicated in ROS section below Review of Systems  Objective:  BP 122/64   Pulse 77   Temp 97.8 F (36.6 C) (Oral)   Ht 5\' 2"  (1.575 m)   Wt 122 lb 8 oz (55.6 kg)   SpO2 97%   BMI 22.41 kg/m   Wt Readings from Last 3 Encounters:  08/30/23 122 lb 8 oz (55.6 kg)  12/12/22 164 lb (74.4 kg)  10/18/22 166 lb 6 oz (75.5 kg)      Physical Exam Vitals and nursing note reviewed.  Constitutional:      Appearance: Normal appearance. She is not ill-appearing.     Comments: Ambulates with walker  HENT:     Head: Normocephalic and atraumatic.     Mouth/Throat:     Pharynx: No posterior oropharyngeal erythema.     Comments: Wearing mask Eyes:     Extraocular Movements: Extraocular movements intact.     Conjunctiva/sclera: Conjunctivae normal.     Pupils: Pupils are equal, round, and reactive to light.  Cardiovascular:     Rate and Rhythm: Normal rate and regular rhythm.     Pulses: Normal pulses.     Heart sounds: Normal heart sounds. No murmur heard. Pulmonary:     Effort: Pulmonary effort is normal. No respiratory distress.     Breath sounds: Normal breath sounds. No wheezing, rhonchi or rales.  Musculoskeletal:        General: Tenderness present.     Right lower leg: Edema (tr) present.     Left lower leg: Edema (tr) present.     Comments:  Tenderness to palpation to right lateral  thigh Neg seated SLR bilaterally. Pain localized to R knee with int/ext rotation at R hip.  Skin:    General: Skin is warm and dry.     Findings: No rash.  Neurological:     Mental Status: She is alert.  Psychiatric:        Mood and Affect: Mood normal.        Behavior: Behavior normal.       Results for orders placed or performed in visit on 05/19/23  [facility] MAMMOGRAPHY   Collection Time: 05/18/23  9:38 AM  Result Value Ref Range   HM Mammogram 0-4 Bi-Rad 0-4 Bi-Rad, Self Reported Normal   Lab Results  Component Value Date   CHOL 137 01/21/2022   HDL 49.20 01/21/2022   LDLCALC 72 01/21/2022   LDLDIRECT 84.0 09/21/2020   TRIG 78.0 01/21/2022   CHOLHDL 3 01/21/2022    Assessment & Plan:   Problem List Items Addressed This Visit     Hyperlipidemia associated with type 2 diabetes mellitus (HCC)   Lipitor refilled. Update FLP next labs.       Relevant Medications   atorvastatin  (LIPITOR) 40 MG tablet   furosemide  (LASIX ) 20 MG tablet   GERD (gastroesophageal reflux disease)   Continues lansoprazole        History of stroke in adulthood   H/o stroke 2022. She states she was told to stop plavix  - I asked her to verify with onc that she should be off this.       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  Latest in remission. Continues maintenance chemo regimen (azacitidine  and venetoclax ).  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       Right leg pain   Not consistent with sciatica or hip arthritis.  Continue tylenol , robaxin  PRN.        Meds ordered this encounter  Medications   atorvastatin  (LIPITOR) 40 MG tablet    Sig: Take 1 tablet (40 mg total) by mouth at bedtime.    Dispense:  90 tablet    Refill:  4   furosemide  (LASIX ) 20 MG  tablet    Sig: Take 1 tablet (20 mg total) by mouth 2 (two) times daily as needed for edema or fluid.    Dispense:  30 tablet    Refill:  3   acetaminophen  (TYLENOL ) 500 MG tablet    Sig: Take 2 tablets (1,000 mg total) by mouth 3 (three) times daily as needed for moderate pain (pain score 4-6).   methocarbamol  (ROBAXIN ) 500 MG tablet    Sig: Take 1 tablet (500 mg total) by mouth every 8 (eight) hours as needed for muscle spasms (sedation precautions).    Dispense:  20 tablet    Refill:  0    No orders of the defined types were placed in this encounter.   Patient Instructions  Check with oncology about Plavix  use Ok to use lasix  as needed for leg swelling.  May continue tylenol  2 tablets as needed for pain. May try robaxin  muscle relaxant as well as needed for leg pain. If leg pain not improving, let oncology team know about it. Continue current medicines Good to see you today Return to see me in 6 months.   Follow up plan: Return in about 6 months (around 02/29/2024) for follow up visit.  Claire Crick, MD

## 2023-08-30 NOTE — Patient Instructions (Addendum)
 Check with oncology about Plavix  use Ok to use lasix  as needed for leg swelling.  May continue tylenol  2 tablets as needed for pain. May try robaxin  muscle relaxant as well as needed for leg pain. If leg pain not improving, let oncology team know about it. Continue current medicines Good to see you today Return to see me in 6 months.

## 2023-09-03 DIAGNOSIS — R600 Localized edema: Secondary | ICD-10-CM | POA: Insufficient documentation

## 2023-09-03 DIAGNOSIS — M79604 Pain in right leg: Secondary | ICD-10-CM | POA: Insufficient documentation

## 2023-09-03 NOTE — Assessment & Plan Note (Signed)
 Regularly sees oncology - Chen at [facility].  Latest in remission. Continues maintenance chemo regimen (azacitidine  and venetoclax ).

## 2023-09-03 NOTE — Assessment & Plan Note (Signed)
 Mild pedal edema - continue PRN lasix .

## 2023-09-03 NOTE — Assessment & Plan Note (Signed)
 Lipitor refilled. Update FLP next labs.

## 2023-09-03 NOTE — Assessment & Plan Note (Signed)
 Continues lansoprazole 

## 2023-09-03 NOTE — Assessment & Plan Note (Addendum)
 H/o stroke 2022. She states she was told to stop plavix  - I asked her to verify with onc that she should be off this.

## 2023-09-03 NOTE — Assessment & Plan Note (Addendum)
 Not consistent with sciatica or hip arthritis.  Continue tylenol , robaxin  PRN.

## 2023-09-05 DIAGNOSIS — D84821 Immunodeficiency due to drugs: Secondary | ICD-10-CM | POA: Diagnosis not present

## 2023-09-05 DIAGNOSIS — C9200 Acute myeloblastic leukemia, not having achieved remission: Secondary | ICD-10-CM | POA: Diagnosis not present

## 2023-09-05 DIAGNOSIS — R1084 Generalized abdominal pain: Secondary | ICD-10-CM | POA: Diagnosis not present

## 2023-09-05 DIAGNOSIS — R945 Abnormal results of liver function studies: Secondary | ICD-10-CM | POA: Diagnosis not present

## 2023-09-05 DIAGNOSIS — Z556 Problems related to health literacy: Secondary | ICD-10-CM | POA: Diagnosis not present

## 2023-09-05 DIAGNOSIS — M25551 Pain in right hip: Secondary | ICD-10-CM | POA: Diagnosis not present

## 2023-09-05 DIAGNOSIS — Z0189 Encounter for other specified special examinations: Secondary | ICD-10-CM | POA: Diagnosis not present

## 2023-09-05 DIAGNOSIS — K769 Liver disease, unspecified: Secondary | ICD-10-CM | POA: Diagnosis not present

## 2023-09-05 DIAGNOSIS — K838 Other specified diseases of biliary tract: Secondary | ICD-10-CM | POA: Diagnosis not present

## 2023-09-05 DIAGNOSIS — N281 Cyst of kidney, acquired: Secondary | ICD-10-CM | POA: Diagnosis not present

## 2023-09-05 DIAGNOSIS — M5416 Radiculopathy, lumbar region: Secondary | ICD-10-CM | POA: Diagnosis not present

## 2023-09-05 DIAGNOSIS — R410 Disorientation, unspecified: Secondary | ICD-10-CM | POA: Diagnosis not present

## 2023-09-05 DIAGNOSIS — I69341 Monoplegia of lower limb following cerebral infarction affecting right dominant side: Secondary | ICD-10-CM | POA: Diagnosis not present

## 2023-09-05 DIAGNOSIS — D61810 Antineoplastic chemotherapy induced pancytopenia: Secondary | ICD-10-CM | POA: Diagnosis not present

## 2023-09-05 DIAGNOSIS — E119 Type 2 diabetes mellitus without complications: Secondary | ICD-10-CM | POA: Diagnosis not present

## 2023-09-05 DIAGNOSIS — Z9189 Other specified personal risk factors, not elsewhere classified: Secondary | ICD-10-CM | POA: Diagnosis not present

## 2023-09-05 DIAGNOSIS — C9202 Acute myeloblastic leukemia, in relapse: Secondary | ICD-10-CM | POA: Diagnosis not present

## 2023-09-05 DIAGNOSIS — G8929 Other chronic pain: Secondary | ICD-10-CM | POA: Diagnosis not present

## 2023-09-05 DIAGNOSIS — I6782 Cerebral ischemia: Secondary | ICD-10-CM | POA: Diagnosis not present

## 2023-09-05 DIAGNOSIS — M5410 Radiculopathy, site unspecified: Secondary | ICD-10-CM | POA: Diagnosis not present

## 2023-09-05 DIAGNOSIS — R109 Unspecified abdominal pain: Secondary | ICD-10-CM | POA: Diagnosis not present

## 2023-09-05 DIAGNOSIS — D696 Thrombocytopenia, unspecified: Secondary | ICD-10-CM | POA: Diagnosis not present

## 2023-09-05 DIAGNOSIS — G319 Degenerative disease of nervous system, unspecified: Secondary | ICD-10-CM | POA: Diagnosis not present

## 2023-09-05 DIAGNOSIS — I82431 Acute embolism and thrombosis of right popliteal vein: Secondary | ICD-10-CM | POA: Diagnosis not present

## 2023-09-05 DIAGNOSIS — R7402 Elevation of levels of lactic acid dehydrogenase (LDH): Secondary | ICD-10-CM | POA: Diagnosis not present

## 2023-09-05 DIAGNOSIS — R509 Fever, unspecified: Secondary | ICD-10-CM | POA: Diagnosis not present

## 2023-09-05 DIAGNOSIS — R627 Adult failure to thrive: Secondary | ICD-10-CM | POA: Diagnosis not present

## 2023-09-05 DIAGNOSIS — R600 Localized edema: Secondary | ICD-10-CM | POA: Diagnosis not present

## 2023-09-05 DIAGNOSIS — M545 Low back pain, unspecified: Secondary | ICD-10-CM | POA: Diagnosis not present

## 2023-09-05 DIAGNOSIS — K219 Gastro-esophageal reflux disease without esophagitis: Secondary | ICD-10-CM | POA: Diagnosis not present

## 2023-09-05 DIAGNOSIS — R531 Weakness: Secondary | ICD-10-CM | POA: Diagnosis not present

## 2023-09-05 DIAGNOSIS — M19041 Primary osteoarthritis, right hand: Secondary | ICD-10-CM | POA: Diagnosis not present

## 2023-09-05 DIAGNOSIS — R9082 White matter disease, unspecified: Secondary | ICD-10-CM | POA: Diagnosis not present

## 2023-09-05 DIAGNOSIS — I69351 Hemiplegia and hemiparesis following cerebral infarction affecting right dominant side: Secondary | ICD-10-CM | POA: Diagnosis not present

## 2023-09-05 DIAGNOSIS — D7589 Other specified diseases of blood and blood-forming organs: Secondary | ICD-10-CM | POA: Diagnosis not present

## 2023-09-05 DIAGNOSIS — Z8673 Personal history of transient ischemic attack (TIA), and cerebral infarction without residual deficits: Secondary | ICD-10-CM | POA: Diagnosis not present

## 2023-09-05 DIAGNOSIS — D61818 Other pancytopenia: Secondary | ICD-10-CM | POA: Diagnosis not present

## 2023-09-05 DIAGNOSIS — I8290 Acute embolism and thrombosis of unspecified vein: Secondary | ICD-10-CM | POA: Diagnosis not present

## 2023-09-05 DIAGNOSIS — R1030 Lower abdominal pain, unspecified: Secondary | ICD-10-CM | POA: Diagnosis not present

## 2023-09-05 DIAGNOSIS — R338 Other retention of urine: Secondary | ICD-10-CM | POA: Diagnosis not present

## 2023-09-05 DIAGNOSIS — E1165 Type 2 diabetes mellitus with hyperglycemia: Secondary | ICD-10-CM | POA: Diagnosis not present

## 2023-09-05 DIAGNOSIS — K5730 Diverticulosis of large intestine without perforation or abscess without bleeding: Secondary | ICD-10-CM | POA: Diagnosis not present

## 2023-09-06 DIAGNOSIS — R627 Adult failure to thrive: Secondary | ICD-10-CM | POA: Diagnosis not present

## 2023-09-06 DIAGNOSIS — C9202 Acute myeloblastic leukemia, in relapse: Secondary | ICD-10-CM | POA: Diagnosis not present

## 2023-09-06 DIAGNOSIS — C9200 Acute myeloblastic leukemia, not having achieved remission: Secondary | ICD-10-CM | POA: Diagnosis not present

## 2023-09-06 DIAGNOSIS — M545 Low back pain, unspecified: Secondary | ICD-10-CM | POA: Diagnosis not present

## 2023-09-06 DIAGNOSIS — D696 Thrombocytopenia, unspecified: Secondary | ICD-10-CM | POA: Diagnosis not present

## 2023-09-06 DIAGNOSIS — G8929 Other chronic pain: Secondary | ICD-10-CM | POA: Diagnosis not present

## 2023-09-06 DIAGNOSIS — R1030 Lower abdominal pain, unspecified: Secondary | ICD-10-CM | POA: Diagnosis not present

## 2023-09-06 DIAGNOSIS — D7589 Other specified diseases of blood and blood-forming organs: Secondary | ICD-10-CM | POA: Diagnosis not present

## 2023-09-06 DIAGNOSIS — E1165 Type 2 diabetes mellitus with hyperglycemia: Secondary | ICD-10-CM | POA: Diagnosis not present

## 2023-09-06 DIAGNOSIS — Z0189 Encounter for other specified special examinations: Secondary | ICD-10-CM | POA: Diagnosis not present

## 2023-09-06 DIAGNOSIS — D61810 Antineoplastic chemotherapy induced pancytopenia: Secondary | ICD-10-CM | POA: Diagnosis not present

## 2023-09-07 DIAGNOSIS — R1084 Generalized abdominal pain: Secondary | ICD-10-CM | POA: Diagnosis not present

## 2023-09-08 DIAGNOSIS — R1084 Generalized abdominal pain: Secondary | ICD-10-CM | POA: Diagnosis not present

## 2023-09-09 DIAGNOSIS — C9202 Acute myeloblastic leukemia, in relapse: Secondary | ICD-10-CM | POA: Diagnosis not present

## 2023-09-10 DIAGNOSIS — C9202 Acute myeloblastic leukemia, in relapse: Secondary | ICD-10-CM | POA: Diagnosis not present

## 2023-09-11 DIAGNOSIS — R338 Other retention of urine: Secondary | ICD-10-CM | POA: Diagnosis not present

## 2023-09-11 DIAGNOSIS — M5410 Radiculopathy, site unspecified: Secondary | ICD-10-CM | POA: Diagnosis not present

## 2023-09-11 DIAGNOSIS — R945 Abnormal results of liver function studies: Secondary | ICD-10-CM | POA: Diagnosis not present

## 2023-09-11 DIAGNOSIS — D696 Thrombocytopenia, unspecified: Secondary | ICD-10-CM | POA: Diagnosis not present

## 2023-09-11 DIAGNOSIS — Z9189 Other specified personal risk factors, not elsewhere classified: Secondary | ICD-10-CM | POA: Diagnosis not present

## 2023-09-11 DIAGNOSIS — C9202 Acute myeloblastic leukemia, in relapse: Secondary | ICD-10-CM | POA: Diagnosis not present

## 2023-09-11 DIAGNOSIS — R7402 Elevation of levels of lactic acid dehydrogenase (LDH): Secondary | ICD-10-CM | POA: Diagnosis not present

## 2023-09-12 DIAGNOSIS — R338 Other retention of urine: Secondary | ICD-10-CM | POA: Diagnosis not present

## 2023-09-12 DIAGNOSIS — C9202 Acute myeloblastic leukemia, in relapse: Secondary | ICD-10-CM | POA: Diagnosis not present

## 2023-09-12 DIAGNOSIS — M5410 Radiculopathy, site unspecified: Secondary | ICD-10-CM | POA: Diagnosis not present

## 2023-09-12 DIAGNOSIS — R7402 Elevation of levels of lactic acid dehydrogenase (LDH): Secondary | ICD-10-CM | POA: Diagnosis not present

## 2023-09-12 DIAGNOSIS — Z9189 Other specified personal risk factors, not elsewhere classified: Secondary | ICD-10-CM | POA: Diagnosis not present

## 2023-09-12 DIAGNOSIS — R945 Abnormal results of liver function studies: Secondary | ICD-10-CM | POA: Diagnosis not present

## 2023-09-12 DIAGNOSIS — D696 Thrombocytopenia, unspecified: Secondary | ICD-10-CM | POA: Diagnosis not present

## 2023-09-19 DIAGNOSIS — C9200 Acute myeloblastic leukemia, not having achieved remission: Secondary | ICD-10-CM | POA: Diagnosis not present

## 2023-09-19 DIAGNOSIS — D61810 Antineoplastic chemotherapy induced pancytopenia: Secondary | ICD-10-CM | POA: Diagnosis not present

## 2023-09-19 DIAGNOSIS — E1165 Type 2 diabetes mellitus with hyperglycemia: Secondary | ICD-10-CM | POA: Diagnosis not present

## 2023-09-27 ENCOUNTER — Telehealth: Payer: Self-pay | Admitting: Family Medicine

## 2023-09-27 NOTE — Telephone Encounter (Addendum)
 Received message that patient passed away 09/23/2023 at inpatient hospice unit.  Tried to call husband Malachi Screws to express my condolences, unable to reach him. Will try again later.

## 2023-09-29 NOTE — Telephone Encounter (Signed)
 So sorry to hear this. Thanks for letting me know.

## 2023-09-29 NOTE — Telephone Encounter (Signed)
 Spoke with patient to express my condolences about wife's passing.  Chart updated. Cc Lisa as fyi.

## 2023-10-04 ENCOUNTER — Other Ambulatory Visit: Payer: Self-pay | Admitting: Family Medicine

## 2023-10-08 DEATH — deceased

## 2024-03-01 ENCOUNTER — Ambulatory Visit: Admitting: Family Medicine
